# Patient Record
Sex: Female | Born: 1945 | Race: White | Hispanic: No | Marital: Married | Smoking: Never smoker
Health system: Southern US, Community
[De-identification: ages and names within clinical notes are randomized; demographics above are authoritative.]

## PROBLEM LIST (undated history)

## (undated) DIAGNOSIS — M1990 Unspecified osteoarthritis, unspecified site: Secondary | ICD-10-CM

## (undated) DIAGNOSIS — R32 Unspecified urinary incontinence: Secondary | ICD-10-CM

## (undated) DIAGNOSIS — I4891 Unspecified atrial fibrillation: Secondary | ICD-10-CM

## (undated) DIAGNOSIS — C50211 Malignant neoplasm of upper-inner quadrant of right female breast: Secondary | ICD-10-CM

## (undated) DIAGNOSIS — R55 Syncope and collapse: Secondary | ICD-10-CM

## (undated) DIAGNOSIS — E049 Nontoxic goiter, unspecified: Secondary | ICD-10-CM

## (undated) DIAGNOSIS — I480 Paroxysmal atrial fibrillation: Secondary | ICD-10-CM

## (undated) DIAGNOSIS — R159 Full incontinence of feces: Secondary | ICD-10-CM

## (undated) DIAGNOSIS — R51 Headache: Secondary | ICD-10-CM

## (undated) DIAGNOSIS — K6289 Other specified diseases of anus and rectum: Secondary | ICD-10-CM

## (undated) DIAGNOSIS — Z923 Personal history of irradiation: Secondary | ICD-10-CM

## (undated) DIAGNOSIS — Z171 Estrogen receptor negative status [ER-]: Secondary | ICD-10-CM

## (undated) DIAGNOSIS — N390 Urinary tract infection, site not specified: Secondary | ICD-10-CM

## (undated) DIAGNOSIS — N3946 Mixed incontinence: Secondary | ICD-10-CM

## (undated) DIAGNOSIS — E785 Hyperlipidemia, unspecified: Secondary | ICD-10-CM

## (undated) DIAGNOSIS — C50919 Malignant neoplasm of unspecified site of unspecified female breast: Secondary | ICD-10-CM

## (undated) DIAGNOSIS — Z803 Family history of malignant neoplasm of breast: Secondary | ICD-10-CM

## (undated) DIAGNOSIS — Z800 Family history of malignant neoplasm of digestive organs: Secondary | ICD-10-CM

## (undated) DIAGNOSIS — R519 Headache, unspecified: Secondary | ICD-10-CM

## (undated) DIAGNOSIS — Z808 Family history of malignant neoplasm of other organs or systems: Secondary | ICD-10-CM

## (undated) DIAGNOSIS — Z87898 Personal history of other specified conditions: Secondary | ICD-10-CM

## (undated) HISTORY — DX: Unspecified urinary incontinence: R32

## (undated) HISTORY — DX: Unspecified atrial fibrillation: I48.91

## (undated) HISTORY — PX: BREAST LUMPECTOMY: SHX2

## (undated) HISTORY — DX: Full incontinence of feces: R15.9

## (undated) HISTORY — DX: Family history of malignant neoplasm of digestive organs: Z80.0

## (undated) HISTORY — DX: Hyperlipidemia, unspecified: E78.5

## (undated) HISTORY — DX: Syncope and collapse: R55

## (undated) HISTORY — DX: Unspecified osteoarthritis, unspecified site: M19.90

## (undated) HISTORY — DX: Family history of malignant neoplasm of other organs or systems: Z80.8

## (undated) HISTORY — DX: Headache, unspecified: R51.9

## (undated) HISTORY — DX: Nontoxic goiter, unspecified: E04.9

## (undated) HISTORY — DX: Headache: R51

## (undated) HISTORY — DX: Family history of malignant neoplasm of breast: Z80.3

## (undated) HISTORY — DX: Urinary tract infection, site not specified: N39.0

---

## 1976-08-23 HISTORY — PX: APPENDECTOMY: SHX54

## 1976-08-23 HISTORY — PX: ABDOMINAL HYSTERECTOMY: SHX81

## 1999-08-24 HISTORY — PX: ROTATOR CUFF REPAIR: SHX139

## 2002-08-23 HISTORY — PX: CATARACT EXTRACTION: SUR2

## 2002-08-23 HISTORY — PX: CATARACT EXTRACTION W/ INTRAOCULAR LENS  IMPLANT, BILATERAL: SHX1307

## 2009-08-23 HISTORY — PX: BREAST SURGERY: SHX581

## 2009-08-23 HISTORY — PX: BREAST BIOPSY: SHX20

## 2011-08-24 HISTORY — PX: LUMBAR LAMINECTOMY: SHX95

## 2011-08-24 HISTORY — PX: LAMINECTOMY: SHX219

## 2015-08-24 HISTORY — PX: GLUTEUS MINIMUS REPAIR: SHX5843

## 2015-08-24 HISTORY — PX: OTHER SURGICAL HISTORY: SHX169

## 2018-11-08 ENCOUNTER — Ambulatory Visit: Payer: Self-pay | Admitting: Family Medicine

## 2018-11-08 ENCOUNTER — Encounter: Payer: Self-pay | Admitting: Family Medicine

## 2018-11-08 ENCOUNTER — Ambulatory Visit (INDEPENDENT_AMBULATORY_CARE_PROVIDER_SITE_OTHER): Payer: Medicare Other | Admitting: Family Medicine

## 2018-11-08 ENCOUNTER — Other Ambulatory Visit: Payer: Self-pay

## 2018-11-08 VITALS — BP 120/62 | HR 74 | Temp 98.0°F | Ht 62.0 in | Wt 128.0 lb

## 2018-11-08 DIAGNOSIS — Z1239 Encounter for other screening for malignant neoplasm of breast: Secondary | ICD-10-CM

## 2018-11-08 DIAGNOSIS — N393 Stress incontinence (female) (male): Secondary | ICD-10-CM

## 2018-11-08 DIAGNOSIS — R32 Unspecified urinary incontinence: Secondary | ICD-10-CM

## 2018-11-08 DIAGNOSIS — R159 Full incontinence of feces: Secondary | ICD-10-CM | POA: Diagnosis not present

## 2018-11-08 DIAGNOSIS — R1013 Epigastric pain: Secondary | ICD-10-CM

## 2018-11-08 DIAGNOSIS — E785 Hyperlipidemia, unspecified: Secondary | ICD-10-CM | POA: Insufficient documentation

## 2018-11-08 MED ORDER — CALCIUM CARBONATE-VITAMIN D 500-200 MG-UNIT PO TABS: 1.0000 | ORAL_TABLET | Freq: Every day | ORAL | Status: DC

## 2018-11-08 MED ORDER — CYANOCOBALAMIN 500 MCG PO TABS: 1000.0000 ug | ORAL_TABLET | Freq: Every day | ORAL | Status: DC

## 2018-11-08 NOTE — Progress Notes (Addendum)
Joanne Hansen DOB: 1945/11/05 Encounter date: 11/08/2018  This isa 73 y.o. female who presents to establish care. Chief Complaint  Patient presents with  . Establish Care    Pt states she has had stomach pain and has been drinking alchol pain everyday stopped on february 22nd and states that her stomach has not been to bad since. Pt states she has also had rashes on her chest and breast she is concerned about     History of present illness: Moved here 4 months ago from [place]. Had same physician for 20 years there. Moved to be near daughter here.   Had crampy pain in stomach on a few occasions. Last time was pretty significant. Cut out alcohol Feb 22nd and stomach has felt better since then. Was drinking more than baseline. Alcohol has never been an issue for her in the past; typically would do a couple of bev  Had some pain left side abd last night. No pain right now. No fevers.Bowels have always been issue for her. Fecal incontinence is issue for her and it has changed lifestyle for her. Did clinical trial for bulking agent but it didn't work for her. Specialist she was seeing was  Gastroenterology and there was discussion about implant, but this wasn't pursued. Had constipation most of life. Was taking magnesium but it was causing diarrhea sx. Stopped this a couple of months ago. Usually has leakage after completing bowel movement (or thinking that she has) and will have some leakage after having gone and cleansed self.   Also has urinary incontinence more with stress. Wears protection.   No nausea, no vomiting. No acid reflux.   Took pepcid for about a week after abdominal pain started. This seemed to help with symptoms.   Usually not more than 2 drinks in a setting; and then occasionally wine.   Suddenly has had rash on breast - one on left side that dried up and then just went away. Circular lesion just appeared and then went away. Not itchy, Not seen elsewhere. One  currently on right side of chest.   Was started on B12 for deficiency. Has been taking it for some years.   Last DEXA was 2 years ago; was normal and improved from previous. Ca and Vit D started as preventative. Has been on this for years.   Changed diet a couple of years ago - pescatarian. Really helped with hip pain and overall aches/pains.   Past Medical History:  Diagnosis Date  . A-fib ([place])   . Arthritis   . Fecal incontinence    was in clinical trial for [facility] without benefit.  . Frequent headaches   . Goiter   . Hyperlipidemia   . Syncope   . Urine incontinence   . UTI (urinary tract infection)    Past Surgical History:  Procedure Laterality Date  . ABDOMINAL HYSTERECTOMY  1978   endometriosis, left ovary and tube remain. No hx of abnormal pap smears  . APPENDECTOMY  1978  . BREAST SURGERY  2011   biospy of right breast  . CATARACT EXTRACTION Bilateral 2004  . GLUTEUS MINIMUS REPAIR  2017  . LAMINECTOMY  2013   L5S1  . ROTATOR CUFF REPAIR  2001   left   Allergies  Allergen Reactions  . Penicillins Swelling  . Sulfasalazine Nausea And Vomiting   Current Meds  Medication Sig  . metoprolol tartrate (LOPRESSOR) 25 MG tablet TAKE 1 2 (ONE HALF) TABLET BY MOUTH TWICE DAILY  .  simvastatin (ZOCOR) 40 MG tablet Take 40 mg by mouth daily.   Social History   Tobacco Use  . Smoking status: Never Smoker  . Smokeless tobacco: Never Used  Substance Use Topics  . Alcohol use: Yes   Family History  Problem Relation Age of Onset  . COPD Mother   . Cancer Mother        rectal  . Arthritis Mother   . Hypertension Mother   . Miscarriages / [place] Mother   . Stroke Mother 62  . Heart disease Father   . Heart attack Father 9  . Lung disease Father   . Arthritis Sister   . Diabetes Maternal Grandmother      Review of Systems  Constitutional: Negative for chills, fatigue and fever.  Respiratory: Negative for cough, chest tightness, shortness of breath  and wheezing.   Cardiovascular: Negative for chest pain, palpitations and leg swelling.  Gastrointestinal: Positive for abdominal pain (none currently). Negative for blood in stool, constipation, diarrhea, nausea and vomiting.    Objective:  BP 120/62 (BP Location: Right Arm, Patient Position: Sitting, Cuff Size: Normal)   Pulse 74   Temp 98 F (36.7 C) (Oral)   Ht 5\' 2"  (1.575 m)   Wt 128 lb (58.1 kg)   SpO2 97%   BMI 23.41 kg/m   Weight: 128 lb (58.1 kg)   BP Readings from Last 3 Encounters:  11/08/18 120/62   Wt Readings from Last 3 Encounters:  11/08/18 128 lb (58.1 kg)    Physical Exam Constitutional:      General: She is not in acute distress.    Appearance: She is well-developed.  Cardiovascular:     Rate and Rhythm: Normal rate and regular rhythm.     Heart sounds: Normal heart sounds. No murmur. No friction rub.  Pulmonary:     Effort: Pulmonary effort is normal. No respiratory distress.     Breath sounds: Normal breath sounds. No wheezing or rales.  Abdominal:     General: Abdomen is flat. Bowel sounds are normal. There is no distension.     Palpations: Abdomen is soft. There is no hepatomegaly or splenomegaly.     Tenderness: There is abdominal tenderness (mild) in the epigastric area. There is no guarding or rebound. Negative signs include Murphy's sign and McBurney's sign.  Musculoskeletal:     Right lower leg: No edema.     Left lower leg: No edema.  Neurological:     Mental Status: She is alert and oriented to person, place, and time.  Psychiatric:        Behavior: Behavior normal.     Assessment/Plan: 1. Incontinence of feces, unspecified fecal incontinence type Has tried PO bulking agents, went through clinicl trial of Solesta without benefit. Will have her follow up with [facility] to see what additional they may offer. This is affecting her on daily basis and interfering from social standpoint. - Ambulatory referral to Gastroenterology  2.  Screening for breast cancer Due for repeat mammogram. Will work on getting this ordered (currently getting [employer] printing with attempt at ordering).  3. Urinary incontinence, unspecified type Did some pelvic rehab for fecal incontinence in past; will have her evaluated by uro/gyn since this is significant ongoing issue for her and she prefers to address without medication.  - Ambulatory referral to Urogynecology  4. Abdominal pain: we are referring to GI for fecal incontinence.  Continue with Pepcid as needed.  Recommended continuing to limit alcohol as this  may trigger worsening abdominal pain. Discussed foods that will trigger stomach irritation (ie caffeine, citrus, alcohol). Let us know if any worsening of sx.   Return pending record review.  Micheline Rough, MD  Records from PCP and cardiology requested. A fib tx with metoprolol and it was not recommended to be on blood thinners or aspirin. Will review and let her know once records are seen.

## 2018-12-05 ENCOUNTER — Other Ambulatory Visit: Payer: Self-pay | Admitting: Family Medicine

## 2018-12-05 NOTE — Telephone Encounter (Signed)
Medication is from a historical provider.  Ok to fill?

## 2018-12-05 NOTE — Telephone Encounter (Signed)
Requested medication (s) are due for refill today: yes  Requested medication (s) are on the active medication list: yes  Last refill:  11/08/18  Future visit scheduled: no  Notes to clinic:  Historical med and historical provider   Requested Prescriptions  Pending Prescriptions Disp Refills   metoprolol tartrate (LOPRESSOR) 25 MG tablet       Cardiovascular:  Beta Blockers Passed - 12/05/2018  9:55 AM      Passed - Last BP in normal range    BP Readings from Last 1 Encounters:  11/08/18 120/62         Passed - Last Heart Rate in normal range    Pulse Readings from Last 1 Encounters:  11/08/18 74         Passed - Valid encounter within last 6 months    Recent Outpatient Visits          3 weeks ago Incontinence of feces, unspecified fecal incontinence type   Therapist, music at [employer], Steele Berg, MD

## 2018-12-06 ENCOUNTER — Other Ambulatory Visit: Payer: Self-pay | Admitting: Family Medicine

## 2018-12-06 MED ORDER — METOPROLOL TARTRATE 25 MG PO TABS: ORAL_TABLET | ORAL | 1 refills | Status: DC

## 2018-12-06 NOTE — Telephone Encounter (Signed)
Patient is calling to check status on RX, she stated that she is taking 0.5 tab BID.

## 2018-12-06 NOTE — Telephone Encounter (Signed)
Please advise. Rx was filled by historical provider in January.  Ok to fill?

## 2018-12-06 NOTE — Telephone Encounter (Signed)
[place] for refill; please clarify dosing since rx refill came across without sig. At visit looked like 25mg  tablet and she was doing 1/2 tab BID. OK to send in 90 day supply of current dose once confirmed.

## 2018-12-06 NOTE — Telephone Encounter (Signed)
I have sent in refill for her

## 2019-01-12 ENCOUNTER — Telehealth: Payer: Self-pay | Admitting: Family Medicine

## 2019-01-12 NOTE — Telephone Encounter (Signed)
Copied from [place] [phone]. Topic: Quick Communication - Rx Refill/Question >> Jan 12, 2019 12:32 PM Mcneil, Ja-Kwan wrote: Medication: simvastatin (ZOCOR) 40 MG tablet  - Pt requests 60 day or 90 day supply  Has the patient contacted their pharmacy? no  Preferred Pharmacy (with phone number or street name): [place], [place] [phone] (Phone) [phone] (Fax)  Agent: Please be advised that RX refills may take up to 3 business days. We ask that you follow-up with your pharmacy.

## 2019-01-17 ENCOUNTER — Other Ambulatory Visit: Payer: Self-pay | Admitting: Family Medicine

## 2019-01-17 MED ORDER — SIMVASTATIN 40 MG PO TABS: 40.0000 mg | ORAL_TABLET | Freq: Every day | ORAL | 1 refills | Status: DC

## 2019-01-17 NOTE — Telephone Encounter (Signed)
I called the pt and informed her of the message below

## 2019-01-17 NOTE — Telephone Encounter (Signed)
Pt calling to check status of refill request.

## 2019-01-17 NOTE — Telephone Encounter (Signed)
done

## 2019-01-21 ENCOUNTER — Encounter: Payer: Self-pay | Admitting: Family Medicine

## 2019-02-27 ENCOUNTER — Telehealth: Payer: Self-pay

## 2019-02-27 ENCOUNTER — Telehealth: Payer: Self-pay | Admitting: *Deleted

## 2019-02-27 DIAGNOSIS — Z1239 Encounter for other screening for malignant neoplasm of breast: Secondary | ICD-10-CM

## 2019-02-27 DIAGNOSIS — R159 Full incontinence of feces: Secondary | ICD-10-CM

## 2019-02-27 NOTE — Telephone Encounter (Signed)
Copied from [place] [id number]. Topic: General - Other >> Feb 27, 2019 11:02 AM Marin Olp L wrote: Reason for CRM: Patient would like mammo in [place]. She also wants to know when she is supposed to follow up overall. Please advise.

## 2019-02-27 NOTE — Telephone Encounter (Signed)
Copied from [place] [id number]. Topic: Referral - Request for Referral >> Feb 27, 2019 10:59 AM Marin Olp L wrote: Has patient seen PCP for this complaint? Yes.   *If NO, is insurance requiring patient see PCP for this issue before PCP can refer them? Referral for which specialty: rectal surgeon Preferred provider/office: Dr. Leighton Ruff [facility] [address] P: [phone] Reason for referral: incontinance  Would like a call once placed.

## 2019-02-28 NOTE — Telephone Encounter (Signed)
[place] for mammogram order breast center. 6 months from previous visit is fine unless she is having any issues sooner. If she is feeling well overall; we could delay this pending COVID status.

## 2019-02-28 NOTE — Telephone Encounter (Signed)
Referral placed and I called the pt and informed her someone will call with appt info.

## 2019-02-28 NOTE — Telephone Encounter (Signed)
Ok to place referral.

## 2019-02-28 NOTE — Telephone Encounter (Signed)
I called the pt and informed her the order was placed.  Patient given the number and she agreed to call the [facility] for an appt.

## 2019-03-15 ENCOUNTER — Other Ambulatory Visit: Payer: Self-pay | Admitting: General Surgery

## 2019-03-15 NOTE — H&P (Signed)
History of Present Illness Leighton Ruff MD; 5/39/7673 1:55 PM) The patient is a 73 year old female who presents with fecal incontinence. 73 year old female who presents to the office with complaints of fecal incontinence. She states that she has leakage after bowel movements only. She occasionally has incontinence to flatus as well. This is been going on for several years. She was involved in a study in [place] for Solesta injections but this showed no improvement in her symptoms. She has tried Metamucil supplementation in the past. She has a history of constipation, but has improved this with dietary modifications.   Past Surgical History Mammie Lorenzo, LPN; 12/09/3788 2:40 PM) Appendectomy Breast Biopsy Right. Cataract Surgery Bilateral. Foot Surgery Right. Hysterectomy (not due to cancer) - Complete Oral Surgery Shoulder Surgery Left. Spinal Surgery - Lower Back  Diagnostic Studies History Mammie Lorenzo, LPN; 9/73/5329 9:24 PM) Colonoscopy 5-10 years ago Mammogram 1-3 years ago Pap Smear >5 years ago  Allergies Nance Pew, CMA; 03/15/2019 1:47 PM) Penicillins Allergies Reconciled  Medication History Nance Pew, CMA; 03/15/2019 1:48 PM) Simvastatin (10MG  Tablet, Oral) Active. Calcium (500MG  Tablet, Oral) Active. B Complex w/B-12 (Oral) Active. Metoprolol Succinate ER (50MG  Tablet ER 24HR, Oral) Active. Myrbetriq (25MG  Tablet ER 24HR, Oral) Active. Medications Reconciled  Social History Mammie Lorenzo, LPN; 2/68/3419 6:22 PM) Alcohol use Occasional alcohol use. Caffeine use Coffee. Illicit drug use Prefer to discuss with provider. Tobacco use Former smoker.  Family History Mammie Lorenzo, LPN; 2/97/9892 1:19 PM) Arthritis Mother. Breast Cancer Sister. Heart Disease Father. Heart disease in female family member before age 28 Hypertension Mother. Rectal Cancer Mother.  Pregnancy / Birth History Mammie Lorenzo, LPN; 12/08/4079  4:48 PM) Age of menopause <47 Contraceptive History Intrauterine device, Oral contraceptives. Gravida 1 Maternal age 26-20 Para 1  Other Problems Mammie Lorenzo, LPN; 1/85/6314 9:70 PM) Arthritis Atrial Fibrillation Back Pain Hemorrhoids Hypercholesterolemia Migraine Headache     Review of Systems Mammie Lorenzo LPN; 2/63/7858 8:50 PM) General Not Present- Appetite Loss, Chills, Fatigue, Fever, Night Sweats, Weight Gain and Weight Loss. Skin Not Present- Change in Wart/Mole, Dryness, Hives, Jaundice, New Lesions, Non-Healing Wounds, Rash and Ulcer. HEENT Not Present- Earache, Hearing Loss, Hoarseness, Nose Bleed, Oral Ulcers, Ringing in the Ears, Seasonal Allergies, Sinus Pain, Sore Throat, Visual Disturbances, Wears glasses/contact lenses and Yellow Eyes. Respiratory Not Present- Bloody sputum, Chronic Cough, Difficulty Breathing, Snoring and Wheezing. Breast Not Present- Breast Mass, Breast Pain, Nipple Discharge and Skin Changes. Cardiovascular Not Present- Chest Pain, Difficulty Breathing Lying Down, Leg Cramps, Palpitations, Rapid Heart Rate, Shortness of Breath and Swelling of Extremities. Gastrointestinal Present- Hemorrhoids. Not Present- Abdominal Pain, Bloating, Bloody Stool, Change in Bowel Habits, Chronic diarrhea, Constipation, Difficulty Swallowing, Excessive gas, Gets full quickly at meals, Indigestion, Nausea, Rectal Pain and Vomiting. Female Genitourinary Not Present- Frequency, Nocturia, Painful Urination, Pelvic Pain and Urgency. Musculoskeletal Not Present- Back Pain, Joint Pain, Joint Stiffness, Muscle Pain, Muscle Weakness and Swelling of Extremities. Neurological Not Present- Decreased Memory, Fainting, Headaches, Numbness, Seizures, Tingling, Tremor, Trouble walking and Weakness. Psychiatric Not Present- Anxiety, Bipolar, Change in Sleep Pattern, Depression, Fearful and Frequent crying. Endocrine Not Present- Cold Intolerance, Excessive Hunger, Hair  Changes, Heat Intolerance, Hot flashes and New Diabetes. Hematology Not Present- Blood Thinners, Easy Bruising, Excessive bleeding, Gland problems, HIV and Persistent Infections.  Vitals (Sabrina Canty CMA; 03/15/2019 1:49 PM) 03/15/2019 1:49 PM Weight: 127.4 lb Height: 62in Body Surface Area: 1.58 m Body Mass Index: 23.3 kg/m  Temp.: 98.44F(Oral)  Pulse: 98 (Regular)  BP:  118/62 (Sitting, Left Arm, Standard)        Physical Exam Leighton Ruff MD; 09/24/5425 2:06 PM)  General Mental Status-Alert. General Appearance-Not in acute distress. Build & Nutrition-Well nourished. Posture-Normal posture. Gait-Normal.  Head and Neck Head-normocephalic, atraumatic with no lesions or palpable masses. Trachea-midline.  Chest and Lung Exam Chest and lung exam reveals -on auscultation, normal breath sounds, no adventitious sounds and normal vocal resonance.  Cardiovascular Cardiovascular examination reveals -normal heart sounds, regular rate and rhythm with no murmurs and no digital clubbing, cyanosis, edema, increased warmth or tenderness.  Abdomen Inspection Inspection of the abdomen reveals - No Hernias. Palpation/Percussion Palpation and Percussion of the abdomen reveal - Soft, Non Tender, No Rigidity (guarding), No hepatosplenomegaly and No Palpable abdominal masses.  Rectal Anorectal Exam External - Note: No anal gape, good resting tone, moderately weak squeeze pressure.  Neurologic Neurologic evaluation reveals -alert and oriented x 3 with no impairment of recent or remote memory, normal attention span and ability to concentrate, normal sensation and normal coordination.  Musculoskeletal Normal Exam - Bilateral-Upper Extremity Strength Normal and Lower Extremity Strength Normal.   Results Leighton Ruff MD; 0/62/3762 2:12 PM) Procedures  Name Value Date ANOSCOPY, DIAGNOSTIC (83151) [ Hemorrhoids ] Procedure Other: Procedure:  Anoscopy....Marland KitchenMarland KitchenSurgeon: Marcello Moores....Marland KitchenMarland KitchenAfter the risks and benefits were explained, verbal consent was obtained for above procedure. A medical assistant chaperone was present thoroughout the entire procedure. ....Marland KitchenMarland KitchenAnesthesia: none....Marland KitchenMarland KitchenDiagnosis: fecal incontinence....Marland KitchenMarland KitchenFindings: Mobile cyst, elevated in distal left anterior lateral rectum. I am unable to visualize this with anoscope. Grade 2 hemorrhoids without obvious inflammation.  Performed: 03/15/2019 2:11 PM    Assessment & Plan Leighton Ruff MD; 7/61/6073 2:11 PM)  RECTAL CYST (K62.89) Impression: Patient underwent anoscopic exam and digital rectal exam today. There was a mobile cyst noted in her right anterior lateral distal rectum approximately 8 cm from anal verge. I could not visually evaluate this with anoscope. I recommended flexible sigmoidoscopy with possible transanal excision.   FECAL INCONTINENCE WITH INCOMPLETE DEFECATION (R15.9) Impression: 73 year old female who presents to the office for evaluation of fecal incontinence. She complains of incomplete evacuation and leakage of stool after bowel movements. On exam today, she has relatively good resting tone but weak squeeze pressures. Her bowel movements are soft. I would like to try to get her to have more bulky bowel movements and see if this helps with her evacuation issues. I have recommended that she start a fiber supplement that is water-soluble. We discussed starting a fiber pill 1-2 per day. I will check on her symptoms in approximately 1 month.

## 2019-04-03 ENCOUNTER — Other Ambulatory Visit: Payer: Self-pay | Admitting: *Deleted

## 2019-04-03 DIAGNOSIS — E785 Hyperlipidemia, unspecified: Secondary | ICD-10-CM

## 2019-04-03 DIAGNOSIS — I4891 Unspecified atrial fibrillation: Secondary | ICD-10-CM

## 2019-04-10 ENCOUNTER — Ambulatory Visit
Admission: RE | Admit: 2019-04-10 | Discharge: 2019-04-10 | Disposition: A | Discharge status: Home / Self Care | Payer: Medicare Other | Source: Ambulatory Visit | Attending: Family Medicine | Admitting: Family Medicine

## 2019-04-10 ENCOUNTER — Other Ambulatory Visit: Payer: Self-pay

## 2019-04-10 DIAGNOSIS — Z1239 Encounter for other screening for malignant neoplasm of breast: Secondary | ICD-10-CM

## 2019-04-11 ENCOUNTER — Other Ambulatory Visit: Payer: Self-pay | Admitting: Family Medicine

## 2019-04-11 DIAGNOSIS — R928 Other abnormal and inconclusive findings on diagnostic imaging of breast: Secondary | ICD-10-CM

## 2019-04-12 ENCOUNTER — Ambulatory Visit
Admission: RE | Admit: 2019-04-12 | Discharge: 2019-04-12 | Disposition: A | Discharge status: Home / Self Care | Payer: Medicare Other | Source: Ambulatory Visit | Attending: Family Medicine | Admitting: Family Medicine

## 2019-04-12 ENCOUNTER — Other Ambulatory Visit: Payer: Self-pay | Admitting: Family Medicine

## 2019-04-12 ENCOUNTER — Other Ambulatory Visit: Payer: Self-pay

## 2019-04-12 DIAGNOSIS — N6310 Unspecified lump in the right breast, unspecified quadrant: Secondary | ICD-10-CM

## 2019-04-12 DIAGNOSIS — R928 Other abnormal and inconclusive findings on diagnostic imaging of breast: Secondary | ICD-10-CM

## 2019-04-16 ENCOUNTER — Other Ambulatory Visit: Payer: Self-pay

## 2019-04-16 ENCOUNTER — Ambulatory Visit
Admission: RE | Admit: 2019-04-16 | Discharge: 2019-04-16 | Disposition: A | Discharge status: Home / Self Care | Payer: Medicare Other | Source: Ambulatory Visit | Attending: Family Medicine | Admitting: Family Medicine

## 2019-04-16 DIAGNOSIS — N6310 Unspecified lump in the right breast, unspecified quadrant: Secondary | ICD-10-CM

## 2019-04-20 ENCOUNTER — Telehealth: Payer: Self-pay | Admitting: Family Medicine

## 2019-04-20 NOTE — Telephone Encounter (Signed)
Just called to touch base with patient regarding breast biopsy + for cancer.   She has appointment scheduled on Monday with surgeon. She does not yet have appointment with oncology but as we discussed I suspect that will occur after meeting with surgeon next week.   She does report that fecal incontinence has improved with taking fiber supplementation. She is currently scheduled for sigmoidoscopy 9/24 and is going to discuss this with surgeon on Monday as well.   I let her know to reach out if needing anything. I will follow along with specialists.

## 2019-04-23 ENCOUNTER — Ambulatory Visit: Payer: Self-pay | Admitting: Surgery

## 2019-04-23 DIAGNOSIS — C50911 Malignant neoplasm of unspecified site of right female breast: Secondary | ICD-10-CM

## 2019-04-23 NOTE — H&P (Signed)
Joanne Hansen Documented: 04/23/2019 11:14 AM Location: [facility] Patient #: [id number] DOB: [birth year] Married / Language: [name] / Race: White Female  History of Present Illness Marcello Moores A. Jannelly Bergren MD; 04/23/2019 12:48 PM) Patient words: Patient sent at the request of Dr. Radford Pax due to an abnormal mammogram. The patient went for screening mammogram was found to have an area of 2 masses right breast at 1:00 which were adjacent to each other. The total area measured 1.1 cm. 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 Prognostic panel pending. The patient states her sister was recently diagnosed with breast cancer at age 15 and is alive. She denies any history of breast pain, nipple discharge or breast mass bilaterally. She is scheduled later in September for a flexible sigmoidoscopy by Dr. Marcello Moores for incontinence and a small cyst.        CLINICAL DATA: 73 year old patient recalled from recent screening mammogram for evaluation a possible asymmetry. EXAM: DIGITAL DIAGNOSTIC RIGHT MAMMOGRAM WITH TOMO ULTRASOUND RIGHT BREAST COMPARISON: Screening mammogram April 10, 2019 and earlier priors ACR Breast Density Category b: There are scattered areas of fibroglandular density. FINDINGS: Spot compression views of the medial right breast, including tomography confirm an irregular mass spanning approximately 9 mm. On physical exam, no mass is palpated in the upper inner quadrant of the right breast. Targeted ultrasound is performed, showing a single irregular mass versus two immediately adjacent small irregular masses, that in total measure 1.1 cm. Individually, the two irregular masses measure 0.8 x 0.4 x 0.5 cm and 0.4 x 0.4 x 0.4 cm, respectively. Mass(es) are at 1 o'clock position 3 cm from the nipple. Ultrasound of the right axilla is negative for lymphadenopathy. IMPRESSION: Two immediately adjacent suspicious masses in the 1 o'clock  position of the right breast 3 cm from the nipple. In total, these measure approximately 1.1 cm. RECOMMENDATION: Ultrasound-guided biopsy (single site) of the suspicious findings at 1 o'clock position in the right breast. I have discussed the findings and recommendations with the patient. Results were also provided in writing at the conclusion of the visit. If applicable, a reminder letter will be sent to the patient regarding the next appointment. BI-RADS CATEGORY 5: Highly suggestive of malignancy. Electronically Signed By: Curlene Dolphin M.D. On: 04/12/2019 16:26  Result History   Study Result  CLINICAL DATA: 73 year old patient recalled from recent screening mammogram for evaluation a possible asymmetry. EXAM: DIGITAL DIAGNOSTIC RIGHT MAMMOGRAM WITH TOMO ULTRASOUND RIGHT BREAST COMPARISON: Screening mammogram April 10, 2019 and earlier priors ACR Breast Density Category b: There are scattered areas of fibroglandular density. FINDINGS: Spot compression views of the medial right breast, including tomography confirm an irregular mass spanning approximately 9 mm. On physical exam, no mass is palpated in the upper inner quadrant of the right breast. Targeted ultrasound is performed, showing a single irregular mass versus two immediately adjacent small irregular masses, that in total measure 1.1 cm. Individually, the two irregular masses measure 0.8 x 0.4 x 0.5 cm and 0.4 x 0.4 x 0.4 cm, respectively. Mass(es) are at 1 o'clock position 3 cm from the nipple. Ultrasound of the right axilla is negative for lymphadenopathy. IMPRESSION: Two immediately adjacent suspicious masses in the 1 o'clock position of the right breast 3 cm from the nipple. In total, these measure approximately 1.1 cm. RECOMMENDATION: Ultrasound-guided biopsy (single site) of the suspicious findings at 1 o'clock position in the right breast. I have discussed the findings and recommendations with the  patient. Results were also  provided in writing at the conclusion of the visit. If applicable, a reminder letter will be sent to the patient regarding the next appointment. BI-RADS CATEGORY 5: Highly suggestive of malignancy. Electronically Signed By: Curlene Dolphin M.D. On: 04/12/2019 16:26            Diagnosis Breast, right, needle core biopsy, 1 o'clock - INVASIVE DUCTAL CARCINOMA. - SEE COMMENT. Microscopic Comment The carcinoma appears at least grade II. A breast prognostic profile will be performed and the results reported separately. The results were called to [facility] on 04/17/2019. (JBK:ah 04/17/19).  The patient is a 73 year old female.   Allergies Sabino Gasser, CMA; 04/23/2019 11:14 AM) Penicillins Allergies Reconciled  Medication History Sabino Gasser, CMA; 04/23/2019 11:15 AM) Metoprolol Succinate ER (50MG  Tablet ER 24HR, Oral) Active. Medications Reconciled Simvastatin (10MG  Tablet, Oral) Active. Calcium (500MG  Tablet, Oral) Active. B Complex w/B-12 (Oral) Active.    Vitals Sabino Gasser CMA; 04/23/2019 11:17 AM) 04/23/2019 11:15 AM Weight: 128.13 lb Height: 64in Body Surface Area: 1.62 m Body Mass Index: 21.99 kg/m  Temp.: 98.22F(Oral)  Pulse: 97 (Regular)  BP: 110/72 (Sitting, Left Arm, Standard)        Physical Exam (Shiann Kam A. Chyan Carnero MD; 04/23/2019 12:48 PM)  General Mental Status-Alert. General Appearance-Consistent with stated age. Hydration-Well hydrated. Voice-Normal.  Head and Neck Head-normocephalic, atraumatic with no lesions or palpable masses. Trachea-midline. Thyroid Gland Characteristics - normal size and consistency.  Chest and Lung Exam Chest and lung exam reveals -quiet, even and easy respiratory effort with no use of accessory muscles and on auscultation, normal breath sounds, no adventitious sounds and normal vocal resonance. Inspection Chest Wall -  Normal. Back - normal.  Breast Note: Steri-Strips of her right medial breast noted. No hematoma or masses palpable. Left breast is normal.  Cardiovascular Cardiovascular examination reveals -normal heart sounds, regular rate and rhythm with no murmurs and normal pedal pulses bilaterally.  Neurologic Neurologic evaluation reveals -alert and oriented x 3 with no impairment of recent or remote memory. Mental Status-Normal.  Musculoskeletal Normal Exam - Left-Upper Extremity Strength Normal and Lower Extremity Strength Normal. Normal Exam - Right-Upper Extremity Strength Normal and Lower Extremity Strength Normal.  Lymphatic Head & Neck  General Head & Neck Lymphatics: Bilateral - Description - Normal. Axillary  General Axillary Region: Bilateral - Description - Normal. Tenderness - Non Tender.    Assessment & Plan (Kailah Pennel A. Ojas Coone MD; 04/23/2019 12:49 PM)  BREAST CANCER, RIGHT (C50.911) Impression: Stage I Discussed breast conserving surgery with lumpectomy and axillary recent lymph node mapping. She is scheduled for a flexible sigmoidoscopy later in September due to fecal continence issues and a possible cyst. I discussed this with Dr. Marcello Moores today in the office and she states her procedure conservatively be moved or postponed if necessary given her circumstances.   Risk of lumpectomy include bleeding, infection, seroma, more surgery, use of seed/wire, wound care, cosmetic deformity and the need for other treatments, death , blood clots, death. Pt agrees to proceed. Risk of sentinel lymph node mapping include bleeding, infection, lymphedema, shoulder pain. stiffness, dye allergy. cosmetic deformity , blood clots, death, need for more surgery. Pt agrees to proceed.  Current Plans You are being scheduled for surgery- Our schedulers will call you.  You should hear from our office's scheduling department within 5 working days about the location, date, and time of  surgery. We try to make accommodations for patient's preferences in scheduling surgery, but sometimes the OR schedule or the  surgeon's schedule prevents [place] from making those accommodations.  If you have not heard from our office [phone]) in 5 working days, call the office and ask for your surgeon's nurse.  If you have other questions about your diagnosis, plan, or surgery, call the office and ask for your surgeon's nurse.  Pt Education - [facility] Breast Cancer Information Given - Alight "Breast Journey" Package We discussed the staging and pathophysiology of breast cancer. We discussed all of the different options for treatment for breast cancer including surgery, chemotherapy, radiation therapy, Herceptin, and antiestrogen therapy. We discussed a sentinel lymph node biopsy as she does not appear to having lymph node involvement right now. We discussed the performance of that with injection of radioactive tracer and blue dye. We discussed that she would have an incision underneath her axillary hairline. We discussed that there is a bout a 10-20% chance of having a positive node with a sentinel lymph node biopsy and we will await the permanent pathology to make any other first further decisions in terms of her treatment. One of these options might be to return to the operating room to perform an axillary lymph node dissection. We discussed about a 1-2% risk lifetime of chronic shoulder pain as well as lymphedema associated with a sentinel lymph node biopsy. We discussed the options for treatment of the breast cancer which included lumpectomy versus a mastectomy. We discussed the performance of the lumpectomy with a wire placement. We discussed a 10-20% chance of a positive margin requiring reexcision in the operating room. We also discussed that she may need radiation therapy or antiestrogen therapy or both if she undergoes lumpectomy. We discussed the mastectomy and the postoperative care for that as  well. We discussed that there is no difference in her survival whether she undergoes lumpectomy with radiation therapy or antiestrogen therapy versus a mastectomy. There is a slight difference in the local recurrence rate being 3-5% with lumpectomy and about 1% with a mastectomy. We discussed the risks of operation including bleeding, infection, possible reoperation. She understands her further therapy will be based on what her stages at the time of her operation.  Pt Education - flb breast cancer surgery: discussed with patient and provided information. Pt Education - [facility] Breast Biopsy HCI: discussed with patient and provided information. Pt Education - ABC (After Breast Cancer) Class Info: discussed with patient and provided information.

## 2019-04-23 NOTE — H&P (View-Only) (Signed)
Joanne Hansen Documented: 04/23/2019 11:14 AM Location: [facility] Patient #: [id number] DOB: 27-Jul-1946 Married / Language: [name] / Race: White Female  History of Present Illness Joanne Hansen A. Joanne Siegert MD; 04/23/2019 12:48 PM) Patient words: Patient sent at the request of Dr. Radford Pax due to an abnormal mammogram. The patient went for screening mammogram was found to have an area of 2 masses right breast at 1:00 which were adjacent to each other. The total area measured 1.1 cm. 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 Prognostic panel pending. The patient states her sister was recently diagnosed with breast cancer at age 91 and is alive. She denies any history of breast pain, nipple discharge or breast mass bilaterally. She is scheduled later in September for a flexible sigmoidoscopy by Dr. Marcello Hansen for incontinence and a small cyst.        CLINICAL DATA: 73 year old patient recalled from recent screening mammogram for evaluation a possible asymmetry. EXAM: DIGITAL DIAGNOSTIC RIGHT MAMMOGRAM WITH TOMO ULTRASOUND RIGHT BREAST COMPARISON: Screening mammogram April 10, 2019 and earlier priors ACR Breast Density Category b: There are scattered areas of fibroglandular density. FINDINGS: Spot compression views of the medial right breast, including tomography confirm an irregular mass spanning approximately 9 mm. On physical exam, no mass is palpated in the upper inner quadrant of the right breast. Targeted ultrasound is performed, showing a single irregular mass versus two immediately adjacent small irregular masses, that in total measure 1.1 cm. Individually, the two irregular masses measure 0.8 x 0.4 x 0.5 cm and 0.4 x 0.4 x 0.4 cm, respectively. Mass(es) are at 1 o'clock position 3 cm from the nipple. Ultrasound of the right axilla is negative for lymphadenopathy. IMPRESSION: Two immediately adjacent suspicious masses in the 1 o'clock  position of the right breast 3 cm from the nipple. In total, these measure approximately 1.1 cm. RECOMMENDATION: Ultrasound-guided biopsy (single site) of the suspicious findings at 1 o'clock position in the right breast. I have discussed the findings and recommendations with the patient. Results were also provided in writing at the conclusion of the visit. If applicable, a reminder letter will be sent to the patient regarding the next appointment. BI-RADS CATEGORY 5: Highly suggestive of malignancy. Electronically Signed By: Curlene Dolphin M.D. On: 04/12/2019 16:26  Result History   Study Result  CLINICAL DATA: 73 year old patient recalled from recent screening mammogram for evaluation a possible asymmetry. EXAM: DIGITAL DIAGNOSTIC RIGHT MAMMOGRAM WITH TOMO ULTRASOUND RIGHT BREAST COMPARISON: Screening mammogram April 10, 2019 and earlier priors ACR Breast Density Category b: There are scattered areas of fibroglandular density. FINDINGS: Spot compression views of the medial right breast, including tomography confirm an irregular mass spanning approximately 9 mm. On physical exam, no mass is palpated in the upper inner quadrant of the right breast. Targeted ultrasound is performed, showing a single irregular mass versus two immediately adjacent small irregular masses, that in total measure 1.1 cm. Individually, the two irregular masses measure 0.8 x 0.4 x 0.5 cm and 0.4 x 0.4 x 0.4 cm, respectively. Mass(es) are at 1 o'clock position 3 cm from the nipple. Ultrasound of the right axilla is negative for lymphadenopathy. IMPRESSION: Two immediately adjacent suspicious masses in the 1 o'clock position of the right breast 3 cm from the nipple. In total, these measure approximately 1.1 cm. RECOMMENDATION: Ultrasound-guided biopsy (single site) of the suspicious findings at 1 o'clock position in the right breast. I have discussed the findings and recommendations with the  patient. Results were also  provided in writing at the conclusion of the visit. If applicable, a reminder letter will be sent to the patient regarding the next appointment. BI-RADS CATEGORY 5: Highly suggestive of malignancy. Electronically Signed By: Curlene Dolphin M.D. On: 04/12/2019 16:26            Diagnosis Breast, right, needle core biopsy, 1 o'clock - INVASIVE DUCTAL CARCINOMA. - SEE COMMENT. Microscopic Comment The carcinoma appears at least grade II. A breast prognostic profile will be performed and the results reported separately. The results were called to [facility] on 04/17/2019. (JBK:ah 04/17/19).  The patient is a 73 year old female.   Allergies Sabino Gasser, CMA; 04/23/2019 11:14 AM) Penicillins Allergies Reconciled  Medication History Sabino Gasser, CMA; 04/23/2019 11:15 AM) Metoprolol Succinate ER (50MG  Tablet ER 24HR, Oral) Active. Medications Reconciled Simvastatin (10MG  Tablet, Oral) Active. Calcium (500MG  Tablet, Oral) Active. B Complex w/B-12 (Oral) Active.    Vitals Sabino Gasser CMA; 04/23/2019 11:17 AM) 04/23/2019 11:15 AM Weight: 128.13 lb Height: 64in Body Surface Area: 1.62 m Body Mass Index: 21.99 kg/m  Temp.: 98.35F(Oral)  Pulse: 97 (Regular)  BP: 110/72 (Sitting, Left Arm, Standard)        Physical Exam (Darvis Croft A. Priyah Schmuck MD; 04/23/2019 12:48 PM)  General Mental Status-Alert. General Appearance-Consistent with stated age. Hydration-Well hydrated. Voice-Normal.  Head and Neck Head-normocephalic, atraumatic with no lesions or palpable masses. Trachea-midline. Thyroid Gland Characteristics - normal size and consistency.  Chest and Lung Exam Chest and lung exam reveals -quiet, even and easy respiratory effort with no use of accessory muscles and on auscultation, normal breath sounds, no adventitious sounds and normal vocal resonance. Inspection Chest Wall -  Normal. Back - normal.  Breast Note: Steri-Strips of her right medial breast noted. No hematoma or masses palpable. Left breast is normal.  Cardiovascular Cardiovascular examination reveals -normal heart sounds, regular rate and rhythm with no murmurs and normal pedal pulses bilaterally.  Neurologic Neurologic evaluation reveals -alert and oriented x 3 with no impairment of recent or remote memory. Mental Status-Normal.  Musculoskeletal Normal Exam - Left-Upper Extremity Strength Normal and Lower Extremity Strength Normal. Normal Exam - Right-Upper Extremity Strength Normal and Lower Extremity Strength Normal.  Lymphatic Head & Neck  General Head & Neck Lymphatics: Bilateral - Description - Normal. Axillary  General Axillary Region: Bilateral - Description - Normal. Tenderness - Non Tender.    Assessment & Plan (Saphire Barnhart A. Denese Mentink MD; 04/23/2019 12:49 PM)  BREAST CANCER, RIGHT (C50.911) Impression: Stage I Discussed breast conserving surgery with lumpectomy and axillary recent lymph node mapping. She is scheduled for a flexible sigmoidoscopy later in September due to fecal continence issues and a possible cyst. I discussed this with Dr. Marcello Hansen today in the office and she states her procedure conservatively be moved or postponed if necessary given her circumstances.   Risk of lumpectomy include bleeding, infection, seroma, more surgery, use of seed/wire, wound care, cosmetic deformity and the need for other treatments, death , blood clots, death. Pt agrees to proceed. Risk of sentinel lymph node mapping include bleeding, infection, lymphedema, shoulder pain. stiffness, dye allergy. cosmetic deformity , blood clots, death, need for more surgery. Pt agrees to proceed.  Current Plans You are being scheduled for surgery- Our schedulers will call you.  You should hear from our office's scheduling department within 5 working days about the location, date, and time of  surgery. We try to make accommodations for patient's preferences in scheduling surgery, but sometimes the OR schedule or the  surgeon's schedule prevents [place] from making those accommodations.  If you have not heard from our office [phone]) in 5 working days, call the office and ask for your surgeon's nurse.  If you have other questions about your diagnosis, plan, or surgery, call the office and ask for your surgeon's nurse.  Pt Education - [facility] Breast Cancer Information Given - Alight "Breast Journey" Package We discussed the staging and pathophysiology of breast cancer. We discussed all of the different options for treatment for breast cancer including surgery, chemotherapy, radiation therapy, Herceptin, and antiestrogen therapy. We discussed a sentinel lymph node biopsy as she does not appear to having lymph node involvement right now. We discussed the performance of that with injection of radioactive tracer and blue dye. We discussed that she would have an incision underneath her axillary hairline. We discussed that there is a bout a 10-20% chance of having a positive node with a sentinel lymph node biopsy and we will await the permanent pathology to make any other first further decisions in terms of her treatment. One of these options might be to return to the operating room to perform an axillary lymph node dissection. We discussed about a 1-2% risk lifetime of chronic shoulder pain as well as lymphedema associated with a sentinel lymph node biopsy. We discussed the options for treatment of the breast cancer which included lumpectomy versus a mastectomy. We discussed the performance of the lumpectomy with a wire placement. We discussed a 10-20% chance of a positive margin requiring reexcision in the operating room. We also discussed that she may need radiation therapy or antiestrogen therapy or both if she undergoes lumpectomy. We discussed the mastectomy and the postoperative care for that as  well. We discussed that there is no difference in her survival whether she undergoes lumpectomy with radiation therapy or antiestrogen therapy versus a mastectomy. There is a slight difference in the local recurrence rate being 3-5% with lumpectomy and about 1% with a mastectomy. We discussed the risks of operation including bleeding, infection, possible reoperation. She understands her further therapy will be based on what her stages at the time of her operation.  Pt Education - flb breast cancer surgery: discussed with patient and provided information. Pt Education - [facility] Breast Biopsy HCI: discussed with patient and provided information. Pt Education - ABC (After Breast Cancer) Class Info: discussed with patient and provided information.

## 2019-04-24 ENCOUNTER — Other Ambulatory Visit: Payer: Self-pay | Admitting: Surgery

## 2019-04-24 ENCOUNTER — Telehealth: Payer: Self-pay | Admitting: Radiation Oncology

## 2019-04-24 DIAGNOSIS — C50911 Malignant neoplasm of unspecified site of right female breast: Secondary | ICD-10-CM

## 2019-04-24 NOTE — Telephone Encounter (Signed)
New message:    LVM for patient to call back and schedule appt from referral received.

## 2019-05-01 ENCOUNTER — Ambulatory Visit
Admission: RE | Admit: 2019-05-01 | Discharge: 2019-05-01 | Disposition: A | Discharge status: Home / Self Care | Payer: Medicare Other | Source: Ambulatory Visit | Attending: Radiation Oncology | Admitting: Radiation Oncology

## 2019-05-01 ENCOUNTER — Other Ambulatory Visit: Payer: Self-pay

## 2019-05-01 ENCOUNTER — Encounter: Payer: Self-pay | Admitting: Radiation Oncology

## 2019-05-01 DIAGNOSIS — C50911 Malignant neoplasm of unspecified site of right female breast: Secondary | ICD-10-CM | POA: Insufficient documentation

## 2019-05-01 DIAGNOSIS — Z171 Estrogen receptor negative status [ER-]: Secondary | ICD-10-CM

## 2019-05-01 DIAGNOSIS — C50211 Malignant neoplasm of upper-inner quadrant of right female breast: Secondary | ICD-10-CM

## 2019-05-01 NOTE — Progress Notes (Signed)
Location of Breast Cancer: Malignant neoplasm of upper inner quadrant of right breast, ER - - -  Did patient present with symptoms (if so, please note symptoms) or was this found on screening mammography?: Routine mammogram.  Ultrasound 04/12/2019: Single irregular masses measure 0.8 x 0.4 x 0.5 cm and 0.4 x 0.4 x 0.4 cm, respectively, masses are at 1 o'clock position 3 cm from the nipple.  Right axilla was negative for lymphadenopathy.  Diagnostic Mammogram 04/12/2019: Irregular mass spanning approximately 9 mm.  Screening Mammogram 04/10/2019: 2 masses right breast at 1:00 which were adjacent to each other.  The total area measured 1.1 cm.  Histology per Pathology Report: Right Breast 04/16/2019  Receptor Status: ER(0% -), PR (0% -), Her2-neu (-), Ki-67(20%)    Past/Anticipated interventions by surgeon, if any: Dr. Brantley Stage 04/23/2019 -Discussed breast conserving surgery with lumpectomy and axillary recent lymph node mapping.  Patient agrees to proceed. -Right breast lumpectomy with radioactive seed and sentinel lymph node mapping scheduled 05/15/2019   Past/Anticipated interventions by medical oncology, if any: Chemotherapy  Dr. Jana Hakim 05/07/2019  Lymphedema issues, if any:  No  Pain issues, if any: No  SAFETY ISSUES:  Prior radiation? No  Pacemaker/ICD? No  Possible current pregnancy? Hysterectomy  Is the patient on methotrexate? No  Current Complaints / other details:   -Sister diagnosed with breast cancer at 30.    Cori Razor, RN 05/01/2019,7:24 AM

## 2019-05-01 NOTE — Progress Notes (Addendum)
Radiation Oncology         [phone] ________________________________  Name: Joanne Hansen        MRN: [id number]  Date of Service: 05/01/2019 DOB: Mar 13, 1946  [id number], Steele Berg, MD  Erroll Luna, MD     REFERRING PHYSICIAN: Erroll Luna, MD   DIAGNOSIS: The encounter diagnosis was Malignant neoplasm of upper-inner quadrant of right breast in female, estrogen receptor negative ([place]).   HISTORY OF PRESENT ILLNESS: Joanne Hansen is a 73 y.o. female with a new diagnosis of right breast cancer. The patient was noted to have a screening detected mass in the 1:00 position of the right breast. Further diagnostic workup revealed an 8 x 5 x 4 mm, and a 4 x 4 x 4 mm mass like change, it was possible the two were contiguous. Her axilla was negative for adenopathy. 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 She is planning lumpectomy sentinel node biopsy on 05/17/2019 with Dr. Brantley Stage, and is seeing Dr. Jana Hakim on 05/07/2019. She is contacted and seen via MyChart to discus treatment recommendations for her cancer. Of note she is also being worked up for an anorectal cyst and is scheduled for EUA with possible resection of this on 05/15/2019.     PREVIOUS RADIATION THERAPY: No   PAST MEDICAL HISTORY:  Past Medical History:  Diagnosis Date   A-fib ([place])    Arthritis    Fecal incontinence    was in clinical trial for Solesta without benefit.   Frequent headaches    Goiter    Hyperlipidemia    Syncope    Urine incontinence    UTI (urinary tract infection)        PAST SURGICAL HISTORY: Past Surgical History:  Procedure Laterality Date   ABDOMINAL HYSTERECTOMY  1978   endometriosis, left ovary and tube remain. No hx of abnormal pap smears   APPENDECTOMY  1978   BREAST SURGERY  2011   biospy of right breast   CATARACT EXTRACTION Bilateral 2004   GLUTEUS  MINIMUS REPAIR  2017   LAMINECTOMY  2013   L5S1   ROTATOR CUFF REPAIR  2001   left     FAMILY HISTORY:  Family History  Problem Relation Age of Onset   COPD Mother    Cancer Mother        rectal   Arthritis Mother    Hypertension Mother    Miscarriages / [place] Mother    Stroke Mother 7   Alzheimer's disease Mother 44   Heart disease Father    Heart attack Father 40   Lung disease Father    Arthritis Sister    Breast cancer Sister 93       Lumpectomy   Diabetes Maternal Grandmother    Breast cancer Niece 13       Louise's Daughter     SOCIAL HISTORY:  reports that she has never smoked. She has never used smokeless tobacco. She reports current alcohol use. She reports current drug use. Drug: Amphetamines. The patient is married and lives in [place]. She relocated from [place] about a year ago.    ALLERGIES: Penicillins and Sulfasalazine   MEDICATIONS:  Current Outpatient Medications  Medication Sig Dispense Refill   calcium-vitamin D (OSCAL WITH D) 500-200 MG-UNIT tablet Take 1 tablet by mouth daily.     methylcellulose oral powder Take by mouth daily.     metoprolol  tartrate (LOPRESSOR) 25 MG tablet TAKE 1 2 (ONE HALF) TABLET BY MOUTH TWICE DAILY 90 tablet 1   simvastatin (ZOCOR) 40 MG tablet Take 1 tablet (40 mg total) by mouth daily. 90 tablet 1   vitamin B-12 (CYANOCOBALAMIN) 500 MCG tablet Take 2 tablets (1,000 mcg total) by mouth daily.     No current facility-administered medications for this encounter.      REVIEW OF SYSTEMS: On review of systems, the patient reports that she is doing well overall. She denies any chest pain, shortness of breath, cough, fevers, chills, night sweats, unintended weight changes. She denies any bowel or bladder disturbances, and denies abdominal pain, nausea or vomiting. She denies any new musculoskeletal or joint aches or pains. A complete review of systems is obtained and is otherwise negative.       PHYSICAL EXAM:  Wt Readings from Last 3 Encounters:  05/01/19 128 lb (58.1 kg)  11/08/18 128 lb (58.1 kg)    In general this is a well appearing caucasian female in no acute distress. She's alert and oriented x4 and appropriate throughout the examination. Cardiopulmonary assessment is negative for acute distress and she exhibits normal effort. Breast exam is deferred.   ECOG = 0  0 - Asymptomatic (Fully active, able to carry on all predisease activities without restriction)  1 - Symptomatic but completely ambulatory (Restricted in physically strenuous activity but ambulatory and able to carry out work of a light or sedentary nature. For example, light housework, office work)  2 - Symptomatic, <50% in bed during the day (Ambulatory and capable of all self care but unable to carry out any work activities. Up and about more than 50% of waking hours)  3 - Symptomatic, >50% in bed, but not bedbound (Capable of only limited self-care, confined to bed or chair 50% or more of waking hours)  4 - Bedbound (Completely disabled. Cannot carry on any self-care. Totally confined to bed or chair)  5 - Death   Eustace Pen MM, Creech RH, Tormey DC, et al. [phone]). "Toxicity and response criteria of the [facility]". [place] Oncol. 5 (6): 649-55    LABORATORY DATA:  No results found for: WBC, HGB, HCT, MCV, PLT No results found for: NA, K, CL, CO2 No results found for: ALT, AST, GGT, ALKPHOS, BILITOT    RADIOGRAPHY: US Breast Ltd Uni Right Inc Axilla  Result Date: 04/12/2019 CLINICAL DATA:  72 year old patient recalled from recent screening mammogram for evaluation a possible asymmetry. EXAM: DIGITAL DIAGNOSTIC RIGHT MAMMOGRAM WITH TOMO ULTRASOUND RIGHT BREAST COMPARISON:  Screening mammogram April 10, 2019 and earlier priors ACR Breast Density Category b: There are scattered areas of fibroglandular density. FINDINGS: Spot compression views of the medial right breast,  including tomography confirm an irregular mass spanning approximately 9 mm. On physical exam, no mass is palpated in the upper inner quadrant of the right breast. Targeted ultrasound is performed, showing a single irregular mass versus two immediately adjacent small irregular masses, that in total measure 1.1 cm. Individually, the two irregular masses measure 0.8 x 0.4 x 0.5 cm and 0.4 x 0.4 x 0.4 cm, respectively. Mass(es) are at 1 o'clock position 3 cm from the nipple. Ultrasound of the right axilla is negative for lymphadenopathy. IMPRESSION: Two immediately adjacent suspicious masses in the 1 o'clock position of the right breast 3 cm from the nipple. In total, these measure approximately 1.1 cm. RECOMMENDATION: Ultrasound-guided biopsy (single site) of the suspicious findings at 1 o'clock position in  the right breast. I have discussed the findings and recommendations with the patient. Results were also provided in writing at the conclusion of the visit. If applicable, a reminder letter will be sent to the patient regarding the next appointment. BI-RADS CATEGORY  5: Highly suggestive of malignancy. Electronically Signed   By: Curlene Dolphin M.D.   On: 04/12/2019 16:26   Mm Diag Breast Tomo Uni Right  Result Date: 04/12/2019 CLINICAL DATA:  73 year old patient recalled from recent screening mammogram for evaluation a possible asymmetry. EXAM: DIGITAL DIAGNOSTIC RIGHT MAMMOGRAM WITH TOMO ULTRASOUND RIGHT BREAST COMPARISON:  Screening mammogram April 10, 2019 and earlier priors ACR Breast Density Category b: There are scattered areas of fibroglandular density. FINDINGS: Spot compression views of the medial right breast, including tomography confirm an irregular mass spanning approximately 9 mm. On physical exam, no mass is palpated in the upper inner quadrant of the right breast. Targeted ultrasound is performed, showing a single irregular mass versus two immediately adjacent small irregular masses, that in  total measure 1.1 cm. Individually, the two irregular masses measure 0.8 x 0.4 x 0.5 cm and 0.4 x 0.4 x 0.4 cm, respectively. Mass(es) are at 1 o'clock position 3 cm from the nipple. Ultrasound of the right axilla is negative for lymphadenopathy. IMPRESSION: Two immediately adjacent suspicious masses in the 1 o'clock position of the right breast 3 cm from the nipple. In total, these measure approximately 1.1 cm. RECOMMENDATION: Ultrasound-guided biopsy (single site) of the suspicious findings at 1 o'clock position in the right breast. I have discussed the findings and recommendations with the patient. Results were also provided in writing at the conclusion of the visit. If applicable, a reminder letter will be sent to the patient regarding the next appointment. BI-RADS CATEGORY  5: Highly suggestive of malignancy. Electronically Signed   By: Curlene Dolphin M.D.   On: 04/12/2019 16:26   Mm 3d Screen Breast Bilateral  Result Date: 04/10/2019 CLINICAL DATA:  Screening. EXAM: DIGITAL SCREENING BILATERAL MAMMOGRAM WITH TOMO AND CAD COMPARISON:  Previous exam(s). ACR Breast Density Category b: There are scattered areas of fibroglandular density. FINDINGS: In the right breast, a possible asymmetry warrants further evaluation. In the left breast, no findings suspicious for malignancy. Images were processed with CAD. IMPRESSION: Further evaluation is suggested for possible asymmetry in the right breast. RECOMMENDATION: Diagnostic mammogram and possibly ultrasound of the right breast. (Code:[id number]) The patient will be contacted regarding the findings, and additional imaging will be scheduled. BI-RADS CATEGORY  0: Incomplete. Need additional imaging evaluation and/or prior mammograms for comparison. Electronically Signed   By: Audie Pinto M.D.   On: 04/10/2019 16:53   Mm Clip Placement Right  Result Date: 04/16/2019 CLINICAL DATA:  Evaluate biopsy marker EXAM: DIAGNOSTIC RIGHT MAMMOGRAM POST ULTRASOUND BIOPSY  COMPARISON:  Previous exam(s). FINDINGS: Mammographic images were obtained following ultrasound guided biopsy of a right breast mass. The ribbon shaped clip is in good position. IMPRESSION: The ribbon shaped clip is in good position. Final Assessment: Post Procedure Mammograms for Marker Placement Electronically Signed   By: Dorise Bullion III M.D   On: 04/16/2019 08:39   [place] Rt Breast Bx W Loc Dev 1st Lesion Img Bx Spec US Guide  Addendum Date: 04/17/2019   ADDENDUM REPORT: 04/17/2019 14:40 ADDENDUM: Pathology revealed GRADE II INVASIVE DUCTAL CARCINOMA of the Right breast, 1 o'clock. This was found to be concordant by Dr. Dorise Bullion. Pathology results were discussed with the patient by telephone by Stacie Acres, RN, Nurse Navigator. The  patient reported doing well after the biopsy with tenderness at the site. Post biopsy instructions and care were reviewed and questions were answered. The patient was encouraged to call [facility] for any additional concerns. Surgical consultation has been arranged with Dr. Erroll Luna at [facility] on April 23, 2019. Pathology results reported by Terie Purser, RN on 04/17/2019. Electronically Signed   By: Dorise Bullion III M.D   On: 04/17/2019 14:40   Result Date: 04/17/2019 CLINICAL DATA:  Biopsy of right breast mass EXAM: ULTRASOUND GUIDED RIGHT BREAST CORE NEEDLE BIOPSY COMPARISON:  Previous exam(s). FINDINGS: I met with the patient and we discussed the procedure of ultrasound-guided biopsy, including benefits and alternatives. We discussed the high likelihood of a successful procedure. We discussed the risks of the procedure, including infection, bleeding, tissue injury, clip migration, and inadequate sampling. Informed written consent was given. The usual time-out protocol was performed immediately prior to the procedure. Lesion quadrant: Upper inner Using sterile technique and 1% Lidocaine as local anesthetic, under  direct ultrasound visualization, a 12 gauge spring-loaded device was used to perform biopsy of a right breast mass at 1 o'clock using a medial approach. At the conclusion of the procedure a tissue marker clip was deployed into the biopsy cavity. Follow up 2 view mammogram was performed and dictated separately. IMPRESSION: Ultrasound guided biopsy of a right breast mass at 1 o'clock. No apparent complications. Electronically Signed: By: Dorise Bullion III M.D On: 04/16/2019 08:30       IMPRESSION/PLAN: 1. Stage IB, [id number] grade 2, triple negative invasive ductal carcinoma of the right breast. Dr. Lisbeth Renshaw discusses the pathology findings and reviews the nature of triple negative, right breast disease. The consensus from the breast conference includes breast conservation with lumpectomy with sentinel node biopsy. Her prognostic panel was not available at the time of her discussion in conference, but given the triple negative component, she may need systemic therapy. She will meet with Dr. Jana Hakim this coming Monday and will discuss this further with him. We discussed the risks, benefits, short, and long term effects of radiotherapy, and the patient is interested in proceeding. Dr. Lisbeth Renshaw discusses the delivery and logistics of radiotherapy and anticipates a course of 4-6 1/2 weeks of radiotherapy, hopefully 4 weeks based on what is known today. We will see her back about 2 weeks after surgery to discuss the simulation process and anticipate we starting radiotherapy about 4-6 weeks after surgery, or about 4 weeks if she needed systemic therapy.  2. Anorectal cyst and fecal urgency. The patient will follow up with Dr. Marcello Moores to determine the timing of her surgery given her breast diagnosis. 3. Possible genetic predisposition to malignancy. The patient is a candidate for genetic testing given her personal and family history. She was offered referral and is interested in meeting with genetics and possible testing  which may influence her surgical decision making.   This encounter was provided by telemedicine platform MyChart.  The patient has given verbal consent for this type of encounter and has been advised to only accept a meeting of this type in a secure network environment. The time spent during this encounter was 60 minutes. The attendants for this meeting include Blenda Nicely, RN, Dr. Lisbeth Renshaw, Hayden Pedro  and Evalee Jefferson [place].  Mr Gammon her Gaylene Brooks was also on the call.   During the encounter,  Blenda Nicely, RN, Dr. Lisbeth Renshaw, and Hayden Pedro were located at [facility].  Corliss Blacker and her husband Aeriel Boulay were located at their vacation home.   The above documentation reflects my direct findings during this shared patient visit. Please see the separate note by Dr. Lisbeth Renshaw on this date for the remainder of the patient's plan of care.    Carola Rhine, PAC

## 2019-05-02 ENCOUNTER — Telehealth: Payer: Self-pay | Admitting: Licensed Clinical Social Worker

## 2019-05-02 ENCOUNTER — Ambulatory Visit: Payer: Medicare Other | Admitting: Oncology

## 2019-05-02 ENCOUNTER — Other Ambulatory Visit: Payer: Medicare Other

## 2019-05-02 ENCOUNTER — Other Ambulatory Visit: Payer: Self-pay | Admitting: Licensed Clinical Social Worker

## 2019-05-02 DIAGNOSIS — C50911 Malignant neoplasm of unspecified site of right female breast: Secondary | ICD-10-CM

## 2019-05-02 NOTE — Progress Notes (Signed)
Patient having pre-op covid test on 9/19 for 9/21 seed placement and 9/22 surgery with Dr Brantley Stage. Patient also having surgery on 9/25 with Dr Marcello Moores, Covid test will be out of acceptable range.  Patient is not available to be tested  at the 3 day window . OK to be retested on 9/24 and use hologic platform.

## 2019-05-02 NOTE — Telephone Encounter (Signed)
Called patient regarding upcoming Webex appointment, patient is notified and e-mail has been sent. °

## 2019-05-02 NOTE — Telephone Encounter (Signed)
An urgent genetic counseling appt has been scheduled for Joanne Hansen to see Faith Rogue on 9/10 at 11am.

## 2019-05-02 NOTE — Progress Notes (Signed)
Order for genetic testing

## 2019-05-03 ENCOUNTER — Encounter: Payer: Self-pay | Admitting: *Deleted

## 2019-05-03 ENCOUNTER — Encounter: Payer: Self-pay | Admitting: Licensed Clinical Social Worker

## 2019-05-03 ENCOUNTER — Inpatient Hospital Stay: Payer: Medicare Other | Attending: Oncology | Admitting: Licensed Clinical Social Worker

## 2019-05-03 ENCOUNTER — Other Ambulatory Visit: Payer: Self-pay

## 2019-05-03 DIAGNOSIS — C50211 Malignant neoplasm of upper-inner quadrant of right female breast: Secondary | ICD-10-CM | POA: Insufficient documentation

## 2019-05-03 DIAGNOSIS — Z171 Estrogen receptor negative status [ER-]: Secondary | ICD-10-CM | POA: Insufficient documentation

## 2019-05-03 DIAGNOSIS — Z800 Family history of malignant neoplasm of digestive organs: Secondary | ICD-10-CM

## 2019-05-03 DIAGNOSIS — Z803 Family history of malignant neoplasm of breast: Secondary | ICD-10-CM

## 2019-05-03 DIAGNOSIS — I4891 Unspecified atrial fibrillation: Secondary | ICD-10-CM | POA: Insufficient documentation

## 2019-05-03 DIAGNOSIS — Z808 Family history of malignant neoplasm of other organs or systems: Secondary | ICD-10-CM

## 2019-05-03 DIAGNOSIS — R159 Full incontinence of feces: Secondary | ICD-10-CM | POA: Insufficient documentation

## 2019-05-03 NOTE — Progress Notes (Signed)
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 She underwent unilateral right diagnostic mammography with tomography and right breast ultrasonography at [place] on 04/12/2019 showing: Breast Density Category B. Spot compression views of the medial right breast, including tomography confirm an irregular mass spanning approximately 0.9 cm. On physical exam, no mass is palpated in the upper inner quadrant of the right breast. Targeted ultrasound is performed, showing a single irregular mass versus two immediately adjacent small irregular masses, that in total measure 1.1 cm. Individually, the two irregular masses measure 0.8 x 0.4 x 0.5 cm and 0.4 x 0.4 x 0.4 cm, respectively. Mass(es) are at 1 o'clock position 3 cm from the nipple. Ultrasound of the right axilla is negative for lymphadenopathy.  Accordingly on 04/16/2019 she proceeded to biopsy of the right breast area in question. The pathology from this procedure showed ([id number]): invasive ductal carcinoma, grade II. Prognostic indicators significant for: estrogen receptor, 0% negative and progesterone receptor, 0% negative,. Proliferation marker Ki67 at 20%. HER2  negative (1+) by immunohistochemistry.  The patient's subsequent history is as detailed below.   INTERVAL HISTORY: Dyllan was evaluated in the breast cancer clinic on 05/07/2019.   Her case was also presented at the multidisciplinary breast cancer conference on 04/25/2019. At that time a preliminary plan was proposed: Breast conserving surgery consideration of adjuvant chemotherapy, adjuvant radiation, consider prophylactic antiestrogens   REVIEW OF SYSTEMS: There were no specific symptoms leading to the original mammogram, which was routinely scheduled. The patient denies unusual headaches, visual changes, nausea, vomiting, stiff neck, dizziness, or gait imbalance. There has been no cough, phlegm production, or pleurisy, no chest pain or pressure, and no change in bowel or bladder habits. The patient denies fever, rash, bleeding, unexplained fatigue or unexplained weight loss.  The patient has an excellent diet (essentially vegan plus eggs and fish) and exercises by walking several times a week.  A detailed review of systems was otherwise entirely negative.    PAST MEDICAL HISTORY: Past Medical History:  Diagnosis Date   A-fib [facility])    Arthritis    Family history of brain cancer    Family history of brain cancer    Family history of breast cancer    Family history of colon cancer    Fecal incontinence    was in clinical trial for [facility] without benefit.   Frequent headaches    Goiter    Hyperlipidemia    Syncope    Urine incontinence    UTI (urinary tract infection)      PAST SURGICAL HISTORY: Past Surgical History:  Procedure Laterality Date   ABDOMINAL HYSTERECTOMY  1978   endometriosis, left ovary and tube remain. No hx of abnormal pap smears   APPENDECTOMY  1978   BREAST SURGERY  2011   biospy of right breast   CATARACT EXTRACTION Bilateral 2004  GLUTEUS MINIMUS REPAIR  2017   LAMINECTOMY  2013   L5S1   ROTATOR CUFF REPAIR  2001   left      FAMILY HISTORY: Family History  Problem Relation Age of Onset   COPD Mother    Cancer Mother 65       colon   Arthritis Mother    Hypertension Mother    Miscarriages / [place] Mother    Stroke Mother 36   Alzheimer's disease Mother 72   Heart disease Father    Heart attack Father 59   Lung disease Father    Arthritis Sister    Breast cancer Sister 67       Lumpectomy   Diabetes Maternal Grandmother    Breast cancer Niece 39       Louise's Daughter   Cervical cancer Niece 44   The patient's father died at age 76 from a heart attack.  The patient's mother died at age 3; she had a gastrointestinal cancer diagnosed age 38.  The patient has 1 sister with breast cancer and one niece with breast cancer diagnosed at age 36.   GYNECOLOGIC HISTORY:  No LMP recorded. Patient has had a hysterectomy. 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  Her husband Germain Osgood (goes by "Ronald Pippins") is a retired Chief Financial Officer.  Their daughter Johnanna Schneiders (currently divorced) lives in [place].  The patient has no grandchildren.  She is not a church attender   ADVANCED DIRECTIVES: The patient's husband is her healthcare power of attorney   HEALTH MAINTENANCE: Social History   Tobacco Use   Smoking status: Never Smoker   Smokeless tobacco: Never Used  Substance Use Topics   Alcohol use: Yes   Drug use: Yes    Types: Amphetamines    Colonoscopy: Due 2022  PAP: Status post hysterectomy  Bone density: Pending   Allergies  Allergen Reactions   Penicillins Swelling   Sulfasalazine Nausea And Vomiting    Current Outpatient Medications  Medication Sig Dispense Refill   Calcium-Phosphorus-Vitamin D (CITRACAL CALCIUM GUMMIES  PO) Take 2 each by mouth daily.     Cyanocobalamin (VITAMIN B 12 PO) Take 1,000 mg by mouth daily. sublingal     methylcellulose oral powder Take by mouth daily.     metoprolol tartrate (LOPRESSOR) 25 MG tablet TAKE 1 2 (ONE HALF) TABLET BY MOUTH TWICE DAILY 90 tablet 1   simvastatin (ZOCOR) 40 MG tablet Take 1 tablet (40 mg total) by mouth daily. 90 tablet 1   No current facility-administered medications for this visit.      OBJECTIVE: Middle-aged white woman who appears stated age  73:   05/07/19 1609  BP: (!) 149/79  Pulse: 71  Resp: 20  Temp: 98.7 F (37.1 C)  SpO2: 100%   Wt Readings from Last 3 Encounters:  05/07/19 128 lb 14.4 oz (58.5 kg)  05/01/19 128 lb (58.1 kg)  11/08/18 128 lb (58.1 kg)   Body mass index is 23.58 kg/m.    ECOG FS:1 - Symptomatic but completely ambulatory  Ocular: Sclerae unicteric, pupils round and equal Ear-nose-throat: Oropharynx clear and moist Lymphatic: No cervical or supraclavicular adenopathy Lungs no rales or rhonchi Heart regular rate and rhythm Abd soft, nontender, positive bowel sounds MSK no focal spinal tenderness,  no joint edema Neuro: non-focal, well-oriented, appropriate affect Breasts: The right breast is status post recent biopsy.  There is a minimal ecchymosis.  The left breast is benign.  Both axillae are benign.   LAB RESULTS:  CMP     Component Value Date/Time   NA 140 05/07/2019 1547   K 4.1 05/07/2019 1547   CL 105 05/07/2019 1547   CO2 28 05/07/2019 1547   GLUCOSE 92 05/07/2019 1547   BUN 20 05/07/2019 1547   CREATININE 0.77 05/07/2019 1547   CALCIUM 9.1 05/07/2019 1547   PROT 6.8 05/07/2019 1547   ALBUMIN 4.2 05/07/2019 1547   AST 19 05/07/2019 1547   ALT 13 05/07/2019 1547   ALKPHOS 71 05/07/2019 1547   BILITOT 0.5 05/07/2019 1547   GFRNONAA >60 05/07/2019 1547   GFRAA >60 05/07/2019 1547    No results found for: TOTALPROTELP, ALBUMINELP, A1GS, A2GS, BETS, BETA2SER, GAMS, MSPIKE,  SPEI  No results found for: KPAFRELGTCHN, LAMBDASER, KAPLAMBRATIO  Lab Results  Component Value Date   WBC 6.7 05/07/2019   NEUTROABS 4.2 05/07/2019   HGB 13.1 05/07/2019   HCT 39.8 05/07/2019   MCV 99.5 05/07/2019   PLT 240 05/07/2019    No results found for: LABCA2  No components found for: IEPPIR518  No results for input(s): INR in the last 168 hours.  No results found for: LABCA2  No results found for: ACZ660  No results found for: YTK160  No results found for: FUX323  No results found for: CA2729  No components found for: HGQUANT  No results found for: CEA1 / No results found for: CEA1   No results found for: AFPTUMOR  No results found for: CHROMOGRNA  No results found for: PSA1  Appointment on 05/07/2019  Component Date Value Ref Range Status   Sodium 05/07/2019 140  135 - 145 mmol/L Final   Potassium 05/07/2019 4.1  3.5 - 5.1 mmol/L Final   Chloride 05/07/2019 105  98 - 111 mmol/L Final   CO2 05/07/2019 28  22 - 32 mmol/L Final   Glucose, Bld 05/07/2019 92  70 - 99 mg/dL Final   BUN 05/07/2019 20  8 - 23 mg/dL Final   Creatinine 05/07/2019 0.77  0.44 - 1.00 mg/dL Final   Calcium 05/07/2019 9.1  8.9 - 10.3 mg/dL Final   Total Protein 05/07/2019 6.8  6.5 - 8.1 g/dL Final   Albumin 05/07/2019 4.2  3.5 - 5.0 g/dL Final   AST 05/07/2019 19  15 - 41 U/L Final   ALT 05/07/2019 13  0 - 44 U/L Final   Alkaline Phosphatase 05/07/2019 71  38 - 126 U/L Final   Total Bilirubin 05/07/2019 0.5  0.3 - 1.2 mg/dL Final   GFR, Est Non Af Am 05/07/2019 >60  >60 mL/min Final   GFR, Est AFR Am 05/07/2019 >60  >60 mL/min Final   Anion gap 05/07/2019 7  5 - 15 Final   Performed at [facility], [place] [address] [place], [place] [address]   WBC Count 05/07/2019 6.7  4.0 - 10.5 K/uL Final   RBC 05/07/2019 4.00  3.87 - 5.11 MIL/uL Final   Hemoglobin 05/07/2019 13.1  12.0 - 15.0 g/dL Final   HCT 05/07/2019 39.8  36.0 - 46.0 %  Final   MCV 05/07/2019 99.5  80.0 - 100.0 fL Final   MCH 05/07/2019 32.8  26.0 - 34.0 pg Final   MCHC 05/07/2019 32.9  30.0 - 36.0 g/dL Final   RDW  05/07/2019 12.4  11.5 - 15.5 % Final   Platelet Count 05/07/2019 240  150 - 400 K/uL Final   nRBC 05/07/2019 0.0  0.0 - 0.2 % Final   Neutrophils Relative % 05/07/2019 62  % Final   Neutro Abs 05/07/2019 4.2  1.7 - 7.7 K/uL Final   Lymphocytes Relative 05/07/2019 26  % Final   Lymphs Abs 05/07/2019 1.7  0.7 - 4.0 K/uL Final   Monocytes Relative 05/07/2019 9  % Final   Monocytes Absolute 05/07/2019 0.6  0.1 - 1.0 K/uL Final   Eosinophils Relative 05/07/2019 2  % Final   Eosinophils Absolute 05/07/2019 0.1  0.0 - 0.5 K/uL Final   Basophils Relative 05/07/2019 1  % Final   Basophils Absolute 05/07/2019 0.0  0.0 - 0.1 K/uL Final   Immature Granulocytes 05/07/2019 0  % Final   Abs Immature Granulocytes 05/07/2019 0.01  0.00 - 0.07 K/uL Final   Performed at [facility], [place] [address] [place], [place] [address]    (this displays the last labs from the last 3 days)  No results found for: TOTALPROTELP, ALBUMINELP, A1GS, A2GS, BETS, BETA2SER, GAMS, MSPIKE, SPEI (this displays SPEP labs)  No results found for: KPAFRELGTCHN, LAMBDASER, KAPLAMBRATIO (kappa/lambda light chains)  No results found for: HGBA, HGBA2QUANT, HGBFQUANT, HGBSQUAN (Hemoglobinopathy evaluation)   No results found for: LDH  No results found for: IRON, TIBC, IRONPCTSAT (Iron and TIBC)  No results found for: FERRITIN  Urinalysis No results found for: COLORURINE, APPEARANCEUR, LABSPEC, PHURINE, GLUCOSEU, HGBUR, BILIRUBINUR, KETONESUR, PROTEINUR, UROBILINOGEN, NITRITE, LEUKOCYTESUR   STUDIES:  US Breast Ltd Uni Right Inc Axilla  Result Date: 04/12/2019 CLINICAL DATA:  73 year old patient recalled from recent screening mammogram for evaluation a possible asymmetry. EXAM: DIGITAL DIAGNOSTIC RIGHT MAMMOGRAM WITH TOMO ULTRASOUND  RIGHT BREAST COMPARISON:  Screening mammogram April 10, 2019 and earlier priors ACR Breast Density Category b: There are scattered areas of fibroglandular density. FINDINGS: Spot compression views of the medial right breast, including tomography confirm an irregular mass spanning approximately 9 mm. On physical exam, no mass is palpated in the upper inner quadrant of the right breast. Targeted ultrasound is performed, showing a single irregular mass versus two immediately adjacent small irregular masses, that in total measure 1.1 cm. Individually, the two irregular masses measure 0.8 x 0.4 x 0.5 cm and 0.4 x 0.4 x 0.4 cm, respectively. Mass(es) are at 1 o'clock position 3 cm from the nipple. Ultrasound of the right axilla is negative for lymphadenopathy. IMPRESSION: Two immediately adjacent suspicious masses in the 1 o'clock position of the right breast 3 cm from the nipple. In total, these measure approximately 1.1 cm. RECOMMENDATION: Ultrasound-guided biopsy (single site) of the suspicious findings at 1 o'clock position in the right breast. I have discussed the findings and recommendations with the patient. Results were also provided in writing at the conclusion of the visit. If applicable, a reminder letter will be sent to the patient regarding the next appointment. BI-RADS CATEGORY  5: Highly suggestive of malignancy. Electronically Signed   By: Curlene Dolphin M.D.   On: 04/12/2019 16:26   Mm Diag Breast Tomo Uni Right  Result Date: 04/12/2019 CLINICAL DATA:  73 year old patient recalled from recent screening mammogram for evaluation a possible asymmetry. EXAM: DIGITAL DIAGNOSTIC RIGHT MAMMOGRAM WITH TOMO ULTRASOUND RIGHT BREAST COMPARISON:  Screening mammogram April 10, 2019 and earlier priors ACR Breast Density Category b: There are scattered areas of fibroglandular density. FINDINGS: Spot compression views of the medial right  breast, including tomography confirm an irregular mass spanning approximately 9  mm. On physical exam, no mass is palpated in the upper inner quadrant of the right breast. Targeted ultrasound is performed, showing a single irregular mass versus two immediately adjacent small irregular masses, that in total measure 1.1 cm. Individually, the two irregular masses measure 0.8 x 0.4 x 0.5 cm and 0.4 x 0.4 x 0.4 cm, respectively. Mass(es) are at 1 o'clock position 3 cm from the nipple. Ultrasound of the right axilla is negative for lymphadenopathy. IMPRESSION: Two immediately adjacent suspicious masses in the 1 o'clock position of the right breast 3 cm from the nipple. In total, these measure approximately 1.1 cm. RECOMMENDATION: Ultrasound-guided biopsy (single site) of the suspicious findings at 1 o'clock position in the right breast. I have discussed the findings and recommendations with the patient. Results were also provided in writing at the conclusion of the visit. If applicable, a reminder letter will be sent to the patient regarding the next appointment. BI-RADS CATEGORY  5: Highly suggestive of malignancy. Electronically Signed   By: Curlene Dolphin M.D.   On: 04/12/2019 16:26   Mm 3d Screen Breast Bilateral  Result Date: 04/10/2019 CLINICAL DATA:  Screening. EXAM: DIGITAL SCREENING BILATERAL MAMMOGRAM WITH TOMO AND CAD COMPARISON:  Previous exam(s). ACR Breast Density Category b: There are scattered areas of fibroglandular density. FINDINGS: In the right breast, a possible asymmetry warrants further evaluation. In the left breast, no findings suspicious for malignancy. Images were processed with CAD. IMPRESSION: Further evaluation is suggested for possible asymmetry in the right breast. RECOMMENDATION: Diagnostic mammogram and possibly ultrasound of the right breast. (Code:[id number]) The patient will be contacted regarding the findings, and additional imaging will be scheduled. BI-RADS CATEGORY  0: Incomplete. Need additional imaging evaluation and/or prior mammograms for comparison.  Electronically Signed   By: Audie Pinto M.D.   On: 04/10/2019 16:53   Mm Clip Placement Right  Result Date: 04/16/2019 CLINICAL DATA:  Evaluate biopsy marker EXAM: DIAGNOSTIC RIGHT MAMMOGRAM POST ULTRASOUND BIOPSY COMPARISON:  Previous exam(s). FINDINGS: Mammographic images were obtained following ultrasound guided biopsy of a right breast mass. The ribbon shaped clip is in good position. IMPRESSION: The ribbon shaped clip is in good position. Final Assessment: Post Procedure Mammograms for Marker Placement Electronically Signed   By: Dorise Bullion III M.D   On: 04/16/2019 08:39   [place] Rt Breast Bx W Loc Dev 1st Lesion Img Bx Spec US Guide  Addendum Date: 04/17/2019   ADDENDUM REPORT: 04/17/2019 14:40 ADDENDUM: Pathology revealed GRADE II INVASIVE DUCTAL CARCINOMA of the Right breast, 1 o'clock. This was found to be concordant by Dr. Dorise Bullion. Pathology results were discussed with the patient by telephone by Stacie Acres, RN, Nurse Navigator. The patient reported doing well after the biopsy with tenderness at the site. Post biopsy instructions and care were reviewed and questions were answered. The patient was encouraged to call [facility] for any additional concerns. Surgical consultation has been arranged with Dr. Erroll Luna at [facility] on April 23, 2019. Pathology results reported by Terie Purser, RN on 04/17/2019. Electronically Signed   By: Dorise Bullion III M.D   On: 04/17/2019 14:40   Result Date: 04/17/2019 CLINICAL DATA:  Biopsy of right breast mass EXAM: ULTRASOUND GUIDED RIGHT BREAST CORE NEEDLE BIOPSY COMPARISON:  Previous exam(s). FINDINGS: I met with the patient and we discussed the procedure of ultrasound-guided biopsy, including benefits and alternatives. We discussed the high likelihood of  a successful procedure. We discussed the risks of the procedure, including infection, bleeding, tissue injury, clip migration, and inadequate  sampling. Informed written consent was given. The usual time-out protocol was performed immediately prior to the procedure. Lesion quadrant: Upper inner Using sterile technique and 1% Lidocaine as local anesthetic, under direct ultrasound visualization, a 12 gauge spring-loaded device was used to perform biopsy of a right breast mass at 1 o'clock using a medial approach. At the conclusion of the procedure a tissue marker clip was deployed into the biopsy cavity. Follow up 2 view mammogram was performed and dictated separately. IMPRESSION: Ultrasound guided biopsy of a right breast mass at 1 o'clock. No apparent complications. Electronically Signed: By: Dorise Bullion III M.D On: 04/16/2019 08:30     ELIGIBLE FOR AVAILABLE RESEARCH PROTOCOL: NO   ASSESSMENT: 73 y.o. [place], [place] woman status post right breast upper quadrant biopsy for an mT1b (or single t1c0 invasive ductal carcinoma, grade 2, triple negative, with an MIB-1-1 of 20%.  (1) genetics testing 05/07/2019 g  (2) definitive surgery pending  (3) adjuvant chemotherapy to consist of cyclophosphamide and docetaxel every 21 days x 4, first dose May 29, 2019  (4) adjuvant radiation to follow  (5) consider prophylactic antiestrogens  PLAN: I spent approximately 60 minutes face to face with Noam with more than 50% of that time spent in counseling and coordination of care. Specifically we reviewed the biology of the patient's diagnosis and the specifics of her situation. We first reviewed the fact that cancer is not one disease but more than 100 different diseases and that it is important to keep them separate-- otherwise when friends and relatives discuss their own cancer experiences with [place] confusion can result. Similarly we explained that if breast cancer spreads to the bone or liver, the patient would not have bone cancer or liver cancer, but breast cancer in the bone and breast cancer in the liver: one cancer in three places--  not 3 different cancers which otherwise would have to be treated in 3 different ways.  We discussed the difference between local and systemic therapy. In terms of loco-regional treatment, lumpectomy plus radiation is equivalent to mastectomy as far as survival is concerned. For this reason, and because the cosmetic results are generally superior, we recommend breast conserving surgery.  We then discussed the rationale for systemic therapy. There is some risk that this cancer may have already spread to other parts of her body. Patients frequently ask at this point about bone scans, CAT scans and PET scans to find out if they have occult breast cancer somewhere else. The problem is that in early stage disease we are much more likely to find false positives then true cancers and this would expose the patient to unnecessary procedures as well as unnecessary radiation. Scans cannot answer the question the patient really would like to know, which is whether she has microscopic disease elsewhere in her body. For those reasons we do not recommend them.  Of course we would proceed to aggressive evaluation of any symptoms that might suggest metastatic disease, but that is not the case here.  Next we went over the options for systemic therapy which are anti-estrogens, anti-HER-2 immunotherapy, and chemotherapy. Shatarra does not meet criteria for anti-HER-2 immunotherapy or antiestrogens.  Her only choice for systemic therapy is chemotherapy and accordingly that is what we recommend.  More specifically she understands that cyclophosphamide plus Adriamycin followed by paclitaxel is standard of care meaning that there is no better  documented treatment.  In cases like hers however where the cancer is small and node-negative we offer patients the option of cyclophosphamide and docetaxel.  This is only 4 doses given 21 days apart so the treatment is completed in 12 weeks.  We discussed the possible toxicities, side effects  and complications of these agents in detail today.  This is an intense treatment and there is a 6% reported rate of permanent baldness (usually a bald spot rather than complete).  Accordingly I recommend my patients receiving this to consider the cryotherapy with Digna cap.  Amit is very interested and we will try to facilitate that for her.  We also discussed port placement which I recommended.  This can be done at the same time as her definitive surgery and I have sent Dr. Brantley Stage and note regarding this  I am hoping we can start her chemotherapy October 6.  Timesha has a good understanding of the overall plan. She agrees with it. She knows the goal of treatment in her case is cure. She will call with any problems that may develop before her next visit here.    Herbert Marken, Virgie Dad, MD  05/07/19 5:24 PM Medical Oncology and Hematology [facility] [address] [place],  [address] Tel. [phone]    Fax. [phone]   I, Jacqualyn Posey am acting as a Education administrator for Chauncey Cruel, MD.   I, Lurline Del MD, have reviewed the above documentation for accuracy and completeness, and I agree with the above.

## 2019-05-03 NOTE — Progress Notes (Signed)
REFERRING PROVIDER: Hayden Pedro, PA-C [place],  [place] [address]  PRIMARY PROVIDER:  Caren Macadam, MD  PRIMARY REASON FOR VISIT:  1. Malignant neoplasm of upper-inner quadrant of right breast in female, estrogen receptor negative ([place])   2. Family history of breast cancer   3. Family history of colon cancer   4. Family history of brain cancer    I connected with Joanne Hansen on 05/03/2019 at 11:00 AM EDT by Jackquline Denmark and verified that I am speaking with the correct person using two identifiers.    Patient location: home Provider location: clinic   HISTORY OF PRESENT ILLNESS:   Joanne Hansen, a 73 y.o. female, was seen for a [facility] cancer genetics consultation due to a personal and family history of cancer.  Joanne Hansen presents to clinic today to discuss the possibility of a hereditary predisposition to cancer, genetic testing, and to further clarify her future cancer risks, as well as potential cancer risks for family members.   In 2020, at the age of 36, Joanne Hansen was diagnosed with IDC of the right breast, triple negative. The treatment plan includes lumpectomy, radiation, and possible chemotherapy. Joanne Hansen reports that genetic test results will not change her surgical decision, she wants lumpectomy.   CANCER HISTORY:  Oncology History   No history exists.    RISK FACTORS:  Menarche was at age 75.  First live birth at age 59.  OCP use for approximately 5 years.  Ovaries intact: she has one ovary.  Hysterectomy: yes.  Menopausal status: postmenopausal.  HRT use: 2 years. Colonoscopy: yes; normal. Mammogram within the last year: yes. Number of breast biopsies: 1.   Past Medical History:  Diagnosis Date  . A-fib ([place])   . Arthritis   . Family history of brain cancer   . Family history of brain cancer   . Family history of breast cancer   . Family history of colon cancer   . Fecal incontinence    was in clinical trial for [facility]  without benefit.  . Frequent headaches   . Goiter   . Hyperlipidemia   . Syncope   . Urine incontinence   . UTI (urinary tract infection)     Past Surgical History:  Procedure Laterality Date  . ABDOMINAL HYSTERECTOMY  1978   endometriosis, left ovary and tube remain. No hx of abnormal pap smears  . APPENDECTOMY  1978  . BREAST SURGERY  2011   biospy of right breast  . CATARACT EXTRACTION Bilateral 2004  . GLUTEUS MINIMUS REPAIR  2017  . LAMINECTOMY  2013   L5S1  . ROTATOR CUFF REPAIR  2001   left    Social History   Socioeconomic History  . Marital status: Married    Spouse name: Not on file  . Number of children: Not on file  . Years of education: Not on file  . Highest education level: Not on file  Occupational History  . Not on file  Social Needs  . Financial resource strain: Not on file  . Food insecurity    Worry: Not on file    Inability: Not on file  . Transportation needs    Medical: No    Non-medical: No  Tobacco Use  . Smoking status: Never Smoker  . Smokeless tobacco: Never Used  Substance and Sexual Activity  . Alcohol use: Yes  . Drug use: Yes    Types: Amphetamines  . Sexual activity: Not on file  Lifestyle  . Physical activity    Days per week: Not on file    Minutes per session: Not on file  . Stress: Not on file  Relationships  . Social Herbalist on phone: Not on file    Gets together: Not on file    Attends religious service: Not on file    Active member of club or organization: Not on file    Attends meetings of clubs or organizations: Not on file    Relationship status: Not on file  Other Topics Concern  . Not on file  Social History Narrative  . Not on file     FAMILY HISTORY:  We obtained a detailed, 4-generation family history.  Significant diagnoses are listed below: Family History  Problem Relation Age of Onset  . COPD Mother   . Cancer Mother 30       colon  . Arthritis Mother   . Hypertension Mother    . Miscarriages / [place] Mother   . Stroke Mother 15  . Alzheimer's disease Mother 69  . Heart disease Father   . Heart attack Father 39  . Lung disease Father   . Arthritis Sister   . Breast cancer Sister 77       Lumpectomy  . Diabetes Maternal Grandmother   . Breast cancer Niece 70       Joanne Hansen's Daughter  . Cervical cancer Niece 33    Joanne Hansen has one daughter, age 58, no history of cancer. She has 3 sisters. One of her sisters, Joanne Hansen, was recently diagnosed with breast cancer at 49. Another sister has a daughter who was diagnosed with breast cancer at 56 and cervical cancer at 3. Patient is unsure if this niece has had genetic testing.  Joanne Hansen mother was diagnosed with colon cancer at 77 and died at 43. Patient has 2 maternal uncles, 3 maternal aunts, no cancers she is aware of. One of her maternal cousins died from brain cancer in her 47s. No other cancers in maternal cousins. Patient's maternal grandmother died at 31 no cancers, maternal grandfather died in his 62s.  Joanne Hansen father died at 65, no cancers. She has limited information about this side of the family. She knows of one paternal aunt who is deceased. She is unaware of paternal cousins. She does not have information about paternal grandparents.  Joanne Hansen is unaware of previous family history of genetic testing for hereditary cancer risks. Patient's maternal ancestors are of [place] descent, and paternal ancestors are of unknown descent. There is no reported Ashkenazi Jewish ancestry. There is no known consanguinity.  GENETIC COUNSELING ASSESSMENT: Joanne Hansen is a 73 y.o. female with a personal and family history which is somewhat suggestive of a hereditary cancer syndorme and predisposition to cancer. We, therefore, discussed and recommended the following at today's visit.   DISCUSSION: We discussed that 5 - 10% of breast cancer is hereditary, with most cases associated with BRCA1/BRCA2  mutations.  There are other genes that can be associated with hereditary breast cancer syndromes.   We discussed that testing is beneficial for several reasons including surgical decision-making for breast cancer, knowing how to follow individuals after completing their treatment, and understand if other family members could be at risk for cancer and allow them to undergo genetic testing.   We reviewed the characteristics, features and inheritance patterns of hereditary cancer syndromes. We also discussed genetic testing, including the appropriate family members to test, the process  of testing, insurance coverage and turn-around-time for results. We discussed the implications of a negative, positive and/or variant of uncertain significant result. We discussed the timing of testing and whether or not she would like these results before her surgery. She feels strongly she would not change her mind about her surgery even if results are positive. However, she still would like test results back quickly. We recommended Joanne Hansen pursue genetic testing for the STAT Breast Cancer Panel + Common Hereditary Cancers gene panel.   The STAT Breast cancer panel offered by [facility] includes sequencing and rearrangement analysis for the following 9 genes:  ATM, BRCA1, BRCA2, CDH1, CHEK2, PALB2, PTEN, STK11 and TP53.    The Common Hereditary Cancers Panel offered by [facility] includes sequencing and/or deletion duplication testing of the following 48 genes: APC, ATM, AXIN2, BARD1, BMPR1A, BRCA1, BRCA2, BRIP1, CDH1, CDKN2A (p14ARF), CDKN2A (p16INK4a), CKD4, CHEK2, CTNNA1, DICER1, EPCAM (Deletion/duplication testing only), GREM1 (promoter region deletion/duplication testing only), KIT, MEN1, MLH1, MSH2, MSH3, MSH6, MUTYH, NBN, NF1, NHTL1, PALB2, PDGFRA, PMS2, POLD1, POLE, PTEN, RAD50, RAD51C, RAD51D, RNF43, SDHB, SDHC, SDHD, SMAD4, SMARCA4. STK11, TP53, TSC1, TSC2, and VHL.  The following genes were evaluated for sequence changes  only: SDHA and HOXB13 c.251G>A variant only.  Based on Joanne Hansen personal and family history of cancer, she meets medical criteria for genetic testing. Despite that she meets criteria, she may still have an out of pocket cost.   PLAN: After considering the risks, benefits, and limitations, Joanne Hansen provided informed consent to pursue genetic testing. She will have her blood drawn at her next appointment, 9/14, and the blood sample will be  sent to [facility] for analysis of the STAT Breast Cancer Panel + Common Hereditary Cancers Panel. Results should be available within approximately 5-12 days' time, at which point they will be disclosed by telephone to Joanne Hansen, as will any additional recommendations warranted by these results. Joanne Hansen will receive a summary of her genetic counseling visit and a copy of her results once available. This information will also be available in Epic.   Based on Joanne Hansen family history, we recommended her niece have genetic counseling and testing. Joanne Hansen will let us know if we can be of any assistance in coordinating genetic counseling and/or testing for this family member.   Lastly, we encouraged Joanne Hansen. Spacek to remain in contact with cancer genetics annually so that we can continuously update the family history and inform her of any changes in cancer genetics and testing that may be of benefit for this family.   Joanne Hansen. Sharrar questions were answered to her satisfaction today. Our contact information was provided should additional questions or concerns arise. Thank you for the referral and allowing [place] to share in the care of your patient.   Faith Rogue, Joanne Hansen, [facility] Genetic Counselor [email or web address] Phone: [phone]  The patient was seen for a total of 40 minutes in virtual genetic counseling.  Drs. Magrinat, Lindi Adie and/or Burr Medico were available for discussion regarding this case.    _______________________________________________________________________ For Office Staff:  Number of people involved in session: 1 Was an Intern/ student involved with case: no

## 2019-05-04 ENCOUNTER — Other Ambulatory Visit: Payer: Self-pay | Admitting: *Deleted

## 2019-05-04 DIAGNOSIS — Z171 Estrogen receptor negative status [ER-]: Secondary | ICD-10-CM

## 2019-05-04 DIAGNOSIS — C50211 Malignant neoplasm of upper-inner quadrant of right female breast: Secondary | ICD-10-CM

## 2019-05-07 ENCOUNTER — Other Ambulatory Visit: Payer: Self-pay

## 2019-05-07 ENCOUNTER — Inpatient Hospital Stay (HOSPITAL_BASED_OUTPATIENT_CLINIC_OR_DEPARTMENT_OTHER): Payer: Medicare Other | Admitting: Oncology

## 2019-05-07 ENCOUNTER — Inpatient Hospital Stay: Payer: Medicare Other

## 2019-05-07 VITALS — BP 149/79 | HR 71 | Temp 98.7°F | Resp 20 | Ht 62.0 in | Wt 128.9 lb

## 2019-05-07 DIAGNOSIS — C50211 Malignant neoplasm of upper-inner quadrant of right female breast: Secondary | ICD-10-CM

## 2019-05-07 DIAGNOSIS — Z803 Family history of malignant neoplasm of breast: Secondary | ICD-10-CM

## 2019-05-07 DIAGNOSIS — Z171 Estrogen receptor negative status [ER-]: Secondary | ICD-10-CM

## 2019-05-07 DIAGNOSIS — R159 Full incontinence of feces: Secondary | ICD-10-CM

## 2019-05-07 DIAGNOSIS — I4891 Unspecified atrial fibrillation: Secondary | ICD-10-CM

## 2019-05-07 LAB — CBC WITH DIFFERENTIAL (CANCER CENTER ONLY)
Abs Immature Granulocytes: 0.01 10*3/uL (ref 0.00–0.07)
Basophils Absolute: 0 10*3/uL (ref 0.0–0.1)
Basophils Relative: 1 %
Eosinophils Absolute: 0.1 10*3/uL (ref 0.0–0.5)
Eosinophils Relative: 2 %
HCT: 39.8 % (ref 36.0–46.0)
Hemoglobin: 13.1 g/dL (ref 12.0–15.0)
Immature Granulocytes: 0 %
Lymphocytes Relative: 26 %
Lymphs Abs: 1.7 10*3/uL (ref 0.7–4.0)
MCH: 32.8 pg (ref 26.0–34.0)
MCHC: 32.9 g/dL (ref 30.0–36.0)
MCV: 99.5 fL (ref 80.0–100.0)
Monocytes Absolute: 0.6 10*3/uL (ref 0.1–1.0)
Monocytes Relative: 9 %
Neutro Abs: 4.2 10*3/uL (ref 1.7–7.7)
Neutrophils Relative %: 62 %
Platelet Count: 240 10*3/uL (ref 150–400)
RBC: 4 MIL/uL (ref 3.87–5.11)
RDW: 12.4 % (ref 11.5–15.5)
WBC Count: 6.7 10*3/uL (ref 4.0–10.5)
nRBC: 0 % (ref 0.0–0.2)

## 2019-05-07 LAB — CMP (CANCER CENTER ONLY)
ALT: 13 U/L (ref 0–44)
AST: 19 U/L (ref 15–41)
Albumin: 4.2 g/dL (ref 3.5–5.0)
Alkaline Phosphatase: 71 U/L (ref 38–126)
Anion gap: 7 (ref 5–15)
BUN: 20 mg/dL (ref 8–23)
CO2: 28 mmol/L (ref 22–32)
Calcium: 9.1 mg/dL (ref 8.9–10.3)
Chloride: 105 mmol/L (ref 98–111)
Creatinine: 0.77 mg/dL (ref 0.44–1.00)
GFR, Est AFR Am: >60 mL/min (ref >60)
GFR, Estimated: >60 mL/min (ref >60)
Glucose, Bld: 92 mg/dL (ref 70–99)
Potassium: 4.1 mmol/L (ref 3.5–5.1)
Sodium: 140 mmol/L (ref 135–145)
Total Bilirubin: 0.5 mg/dL (ref 0.3–1.2)
Total Protein: 6.8 g/dL (ref 6.5–8.1)

## 2019-05-07 MED ORDER — LORATADINE 10 MG PO TABS: 10.0000 mg | ORAL_TABLET | Freq: Every day | ORAL | 0 refills | Status: DC

## 2019-05-07 MED ORDER — PROCHLORPERAZINE MALEATE 10 MG PO TABS: 10.0000 mg | ORAL_TABLET | Freq: Four times a day (QID) | ORAL | 1 refills | Status: DC | PRN

## 2019-05-07 MED ORDER — LIDOCAINE-PRILOCAINE 2.5-2.5 % EX CREA: TOPICAL_CREAM | CUTANEOUS | 3 refills | Status: DC

## 2019-05-07 MED ORDER — DEXAMETHASONE 4 MG PO TABS: 8.0000 mg | ORAL_TABLET | Freq: Two times a day (BID) | ORAL | 1 refills | Status: DC

## 2019-05-07 MED ORDER — LORAZEPAM 0.5 MG PO TABS: 0.5000 mg | ORAL_TABLET | Freq: Every evening | ORAL | 0 refills | Status: DC | PRN

## 2019-05-07 NOTE — Progress Notes (Signed)
START ON PATHWAY REGIMEN - Breast     A cycle is every 21 days:     Docetaxel      Cyclophosphamide   **Always confirm dose/schedule in your pharmacy ordering system**  Patient Characteristics: Postoperative without Neoadjuvant Therapy (Pathologic Staging), Invasive Disease, Adjuvant Therapy, HER2 Negative/Unknown/Equivocal, ER Negative/Unknown, Node Negative, pT1a-c, N2m or pT1c or Higher, pN0 Therapeutic Status: Postoperative without Neoadjuvant Therapy (Pathologic Staging) AJCC Grade: G2 AJCC N Category: pN0 AJCC M Category: cM0 ER Status: Negative (-) AJCC 8 Stage Grouping: IB HER2 Status: Negative (-) Oncotype Dx Recurrence Score: Not Appropriate AJCC T Category: pT1c PR Status: Negative (-) Intent of Therapy: Curative Intent, Discussed with Patient

## 2019-05-08 ENCOUNTER — Telehealth: Payer: Self-pay | Admitting: *Deleted

## 2019-05-08 ENCOUNTER — Other Ambulatory Visit: Payer: Self-pay

## 2019-05-08 ENCOUNTER — Encounter (HOSPITAL_BASED_OUTPATIENT_CLINIC_OR_DEPARTMENT_OTHER): Payer: Self-pay | Admitting: *Deleted

## 2019-05-08 ENCOUNTER — Telehealth: Payer: Self-pay | Admitting: Oncology

## 2019-05-08 NOTE — Telephone Encounter (Signed)
I talk with patient regarding schedule  

## 2019-05-08 NOTE — Telephone Encounter (Signed)
Spoke to pt concerning dx and treatment care plan. Discussed chemo education class as well as anti-nausea medications and neulasta injection. Confirmed future appts. Contact information provided for questions or needs. Denies further question or concerns at this time.

## 2019-05-08 NOTE — Progress Notes (Signed)
PT has a hx of PAF and pre-syncope. Obtained records from her previous cardiologist Dr Fara Olden at [facility] in [facility]. Scanned into EMR as they are not on Epic. Reviewed with Dr. Daiva Huge. Pt will come in for EKG prior to dos, otherwise no further work up needed. Pt will need to establish care with a Cardiologist locally. Pt states that she plans to do so.

## 2019-05-08 NOTE — Telephone Encounter (Signed)
This RN received VM from pt left at 1524 stating she was notified of multiple medications that have been called to her pharmacy " and I am wondering do I need all of them "  Return call number given as [phone].  Per chart review - noted pt has been contacted by navigator per above concern at 1544.

## 2019-05-09 ENCOUNTER — Ambulatory Visit: Payer: Self-pay | Admitting: Surgery

## 2019-05-09 ENCOUNTER — Telehealth: Payer: Self-pay | Admitting: *Deleted

## 2019-05-09 ENCOUNTER — Encounter (HOSPITAL_BASED_OUTPATIENT_CLINIC_OR_DEPARTMENT_OTHER)
Admission: RE | Admit: 2019-05-09 | Discharge: 2019-05-09 | Disposition: A | Discharge status: Home / Self Care | Payer: Medicare Other | Source: Ambulatory Visit | Attending: Surgery | Admitting: Surgery

## 2019-05-09 DIAGNOSIS — Z01812 Encounter for preprocedural laboratory examination: Secondary | ICD-10-CM | POA: Insufficient documentation

## 2019-05-09 NOTE — Progress Notes (Signed)
      Enhanced Recovery after Surgery  Enhanced Recovery after Surgery is a protocol used to improve the stress on your body and your recovery after surgery.  Patient Instructions  . The night before surgery:  o No food after midnight. ONLY clear liquids after midnight  . The day of surgery (if you do NOT have diabetes):  o Drink ONE (1) Pre-Surgery Clear Ensure as directed.   o This drink was given to you during your hospital  pre-op appointment visit. o The pre-op nurse will instruct you on the time to drink the  Pre-Surgery Ensure depending on your surgery time. o Finish the drink at the designated time by the pre-op nurse.  o Nothing else to drink after completing the  Pre-Surgery Clear Ensure.  . The day of surgery (if you have diabetes): o Drink ONE (1) Gatorade 2 (G2) as directed. o This drink was given to you during your hospital  pre-op appointment visit.  o The pre-op nurse will instruct you on the time to drink the   Gatorade 2 (G2) depending on your surgery time. o Color of the Gatorade may vary. Red is not allowed. o Nothing else to drink after completing the  Gatorade 2 (G2).         If you have questions, please contact your surgeon's office.  Surgical soap also given to patient with instructions for use.  Patient verbalized understanding of instructions. 

## 2019-05-09 NOTE — Telephone Encounter (Signed)
Called pt and informed that port has been added to her sx. Also discussed dignicap and email information to verified email. Denies further needs or concerns at this time.

## 2019-05-12 ENCOUNTER — Other Ambulatory Visit (HOSPITAL_COMMUNITY)
Admission: RE | Admit: 2019-05-12 | Discharge: 2019-05-12 | Disposition: A | Discharge status: Home / Self Care | Payer: Medicare Other | Source: Ambulatory Visit | Attending: Surgery | Admitting: Surgery

## 2019-05-12 DIAGNOSIS — Z01812 Encounter for preprocedural laboratory examination: Secondary | ICD-10-CM | POA: Diagnosis present

## 2019-05-12 DIAGNOSIS — Z20828 Contact with and (suspected) exposure to other viral communicable diseases: Secondary | ICD-10-CM | POA: Insufficient documentation

## 2019-05-13 LAB — NOVEL CORONAVIRUS, NAA (HOSP ORDER, SEND-OUT TO REF LAB; TAT 18-24 HRS): SARS-CoV-2, NAA: NOT DETECTED

## 2019-05-14 ENCOUNTER — Ambulatory Visit
Admission: RE | Admit: 2019-05-14 | Discharge: 2019-05-14 | Disposition: A | Discharge status: Home / Self Care | Payer: Medicare Other | Source: Ambulatory Visit | Attending: Surgery | Admitting: Surgery

## 2019-05-14 ENCOUNTER — Other Ambulatory Visit (HOSPITAL_COMMUNITY): Payer: Medicare Other

## 2019-05-14 ENCOUNTER — Other Ambulatory Visit: Payer: Self-pay

## 2019-05-14 DIAGNOSIS — C50911 Malignant neoplasm of unspecified site of right female breast: Secondary | ICD-10-CM

## 2019-05-15 ENCOUNTER — Ambulatory Visit (HOSPITAL_BASED_OUTPATIENT_CLINIC_OR_DEPARTMENT_OTHER): Payer: Medicare Other | Admitting: Anesthesiology

## 2019-05-15 ENCOUNTER — Ambulatory Visit (HOSPITAL_COMMUNITY)
Admission: RE | Admit: 2019-05-15 | Discharge: 2019-05-15 | Disposition: A | Discharge status: Home / Self Care | Payer: Medicare Other | Source: Ambulatory Visit | Attending: Surgery | Admitting: Surgery

## 2019-05-15 ENCOUNTER — Ambulatory Visit (HOSPITAL_COMMUNITY): Payer: Medicare Other

## 2019-05-15 ENCOUNTER — Ambulatory Visit (HOSPITAL_BASED_OUTPATIENT_CLINIC_OR_DEPARTMENT_OTHER)
Admission: RE | Admit: 2019-05-15 | Discharge: 2019-05-15 | Disposition: A | Discharge status: Home / Self Care | Payer: Medicare Other | Attending: Surgery | Admitting: Surgery

## 2019-05-15 ENCOUNTER — Ambulatory Visit
Admission: RE | Admit: 2019-05-15 | Discharge: 2019-05-15 | Disposition: A | Discharge status: Home / Self Care | Payer: Medicare Other | Source: Ambulatory Visit | Attending: Surgery | Admitting: Surgery

## 2019-05-15 ENCOUNTER — Encounter (HOSPITAL_BASED_OUTPATIENT_CLINIC_OR_DEPARTMENT_OTHER): Payer: Self-pay

## 2019-05-15 ENCOUNTER — Encounter (HOSPITAL_BASED_OUTPATIENT_CLINIC_OR_DEPARTMENT_OTHER): Payer: Self-pay | Admitting: *Deleted

## 2019-05-15 ENCOUNTER — Encounter (HOSPITAL_BASED_OUTPATIENT_CLINIC_OR_DEPARTMENT_OTHER)
Admission: RE | Disposition: A | Discharge status: Home / Self Care | Payer: Self-pay | Source: Home / Self Care | Attending: Surgery

## 2019-05-15 ENCOUNTER — Other Ambulatory Visit: Payer: Self-pay

## 2019-05-15 DIAGNOSIS — C50211 Malignant neoplasm of upper-inner quadrant of right female breast: Secondary | ICD-10-CM | POA: Diagnosis not present

## 2019-05-15 DIAGNOSIS — Z803 Family history of malignant neoplasm of breast: Secondary | ICD-10-CM | POA: Diagnosis not present

## 2019-05-15 DIAGNOSIS — E785 Hyperlipidemia, unspecified: Secondary | ICD-10-CM | POA: Diagnosis not present

## 2019-05-15 DIAGNOSIS — Z95828 Presence of other vascular implants and grafts: Secondary | ICD-10-CM

## 2019-05-15 DIAGNOSIS — Z880 Allergy status to penicillin: Secondary | ICD-10-CM | POA: Insufficient documentation

## 2019-05-15 DIAGNOSIS — Z888 Allergy status to other drugs, medicaments and biological substances status: Secondary | ICD-10-CM | POA: Diagnosis not present

## 2019-05-15 DIAGNOSIS — Z171 Estrogen receptor negative status [ER-]: Secondary | ICD-10-CM | POA: Diagnosis not present

## 2019-05-15 DIAGNOSIS — M1990 Unspecified osteoarthritis, unspecified site: Secondary | ICD-10-CM | POA: Diagnosis not present

## 2019-05-15 DIAGNOSIS — C50911 Malignant neoplasm of unspecified site of right female breast: Secondary | ICD-10-CM

## 2019-05-15 HISTORY — PX: PORTACATH PLACEMENT: SHX2246

## 2019-05-15 HISTORY — PX: BREAST LUMPECTOMY WITH RADIOACTIVE SEED AND SENTINEL LYMPH NODE BIOPSY: SHX6550

## 2019-05-15 SURGERY — BREAST LUMPECTOMY WITH RADIOACTIVE SEED AND SENTINEL LYMPH NODE BIOPSY
Anesthesia: General | Site: Chest | Laterality: Right

## 2019-05-15 MED ORDER — PROPOFOL 10 MG/ML IV BOLUS
INTRAVENOUS | Status: AC
  Filled 2019-05-15: qty 20

## 2019-05-15 MED ORDER — CLINDAMYCIN PHOSPHATE 900 MG/50ML IV SOLN: 900.0000 mg | INTRAVENOUS | Status: AC

## 2019-05-15 MED ORDER — GABAPENTIN 300 MG PO CAPS
300.0000 mg | ORAL_CAPSULE | ORAL | Status: AC
  Administered 2019-05-15: 10:00:00 300 mg via ORAL

## 2019-05-15 MED ORDER — ONDANSETRON HCL 4 MG/2ML IJ SOLN
INTRAMUSCULAR | Status: AC
  Filled 2019-05-15: qty 2

## 2019-05-15 MED ORDER — MIDAZOLAM HCL 2 MG/2ML IJ SOLN
INTRAMUSCULAR | Status: AC
  Filled 2019-05-15: qty 2

## 2019-05-15 MED ORDER — FENTANYL CITRATE (PF) 100 MCG/2ML IJ SOLN
INTRAMUSCULAR | Status: AC
  Filled 2019-05-15: qty 2

## 2019-05-15 MED ORDER — HEPARIN (PORCINE) IN NACL 2-0.9 UNITS/ML
INTRAMUSCULAR | Status: AC | PRN
  Administered 2019-05-15: 1

## 2019-05-15 MED ORDER — HEPARIN SOD (PORK) LOCK FLUSH 100 UNIT/ML IV SOLN
INTRAVENOUS | Status: AC
  Filled 2019-05-15: qty 5

## 2019-05-15 MED ORDER — METHYLENE BLUE 0.5 % INJ SOLN
INTRAVENOUS | Status: AC
  Filled 2019-05-15: qty 10

## 2019-05-15 MED ORDER — HEPARIN SOD (PORK) LOCK FLUSH 100 UNIT/ML IV SOLN
INTRAVENOUS | Status: DC | PRN
  Administered 2019-05-15: 400 [IU] via INTRAVENOUS

## 2019-05-15 MED ORDER — DEXAMETHASONE SODIUM PHOSPHATE 4 MG/ML IJ SOLN
INTRAMUSCULAR | Status: DC | PRN
  Administered 2019-05-15: 4 mg via INTRAVENOUS

## 2019-05-15 MED ORDER — OXYCODONE HCL 5 MG PO TABS: 5.0000 mg | ORAL_TABLET | Freq: Once | ORAL | Status: DC | PRN

## 2019-05-15 MED ORDER — GABAPENTIN 300 MG PO CAPS
ORAL_CAPSULE | ORAL | Status: AC
  Filled 2019-05-15: qty 1

## 2019-05-15 MED ORDER — CHLORHEXIDINE GLUCONATE CLOTH 2 % EX PADS: 6.0000 | MEDICATED_PAD | Freq: Once | CUTANEOUS | Status: DC

## 2019-05-15 MED ORDER — LIDOCAINE 2% (20 MG/ML) 5 ML SYRINGE
INTRAMUSCULAR | Status: AC
  Filled 2019-05-15: qty 5

## 2019-05-15 MED ORDER — GABAPENTIN 300 MG PO CAPS: 300.0000 mg | ORAL_CAPSULE | ORAL | Status: AC

## 2019-05-15 MED ORDER — TECHNETIUM TC 99M SULFUR COLLOID FILTERED
1.0000 | Freq: Once | INTRAVENOUS | Status: AC | PRN
  Administered 2019-05-15: 1 via INTRADERMAL

## 2019-05-15 MED ORDER — ACETAMINOPHEN 500 MG PO TABS: 1000.0000 mg | ORAL_TABLET | ORAL | Status: AC

## 2019-05-15 MED ORDER — OXYCODONE HCL 5 MG/5ML PO SOLN: 5.0000 mg | Freq: Once | ORAL | Status: DC | PRN

## 2019-05-15 MED ORDER — BUPIVACAINE-EPINEPHRINE 0.25% -1:200000 IJ SOLN
INTRAMUSCULAR | Status: AC
  Filled 2019-05-15: qty 1

## 2019-05-15 MED ORDER — ACETAMINOPHEN 500 MG PO TABS
1000.0000 mg | ORAL_TABLET | ORAL | Status: AC
  Administered 2019-05-15: 10:00:00 1000 mg via ORAL

## 2019-05-15 MED ORDER — LIDOCAINE HCL (CARDIAC) PF 100 MG/5ML IV SOSY
PREFILLED_SYRINGE | INTRAVENOUS | Status: DC | PRN
  Administered 2019-05-15: 80 mg via INTRAVENOUS

## 2019-05-15 MED ORDER — FENTANYL CITRATE (PF) 100 MCG/2ML IJ SOLN: 25.0000 ug | INTRAMUSCULAR | Status: DC | PRN

## 2019-05-15 MED ORDER — BUPIVACAINE-EPINEPHRINE (PF) 0.5% -1:200000 IJ SOLN
INTRAMUSCULAR | Status: DC | PRN
  Administered 2019-05-15: 30 mL via PERINEURAL

## 2019-05-15 MED ORDER — MIDAZOLAM HCL 2 MG/2ML IJ SOLN
1.0000 mg | INTRAMUSCULAR | Status: DC | PRN
  Administered 2019-05-15: 11:00:00 1 mg via INTRAVENOUS

## 2019-05-15 MED ORDER — CLINDAMYCIN PHOSPHATE 900 MG/50ML IV SOLN
900.0000 mg | INTRAVENOUS | Status: AC
  Administered 2019-05-15: 12:00:00 900 mg via INTRAVENOUS

## 2019-05-15 MED ORDER — LACTATED RINGERS IV SOLN
INTRAVENOUS | Status: DC
  Administered 2019-05-15 (×2): via INTRAVENOUS

## 2019-05-15 MED ORDER — ONDANSETRON HCL 4 MG/2ML IJ SOLN
INTRAMUSCULAR | Status: DC | PRN
  Administered 2019-05-15: 4 mg via INTRAVENOUS

## 2019-05-15 MED ORDER — EPHEDRINE SULFATE 50 MG/ML IJ SOLN
INTRAMUSCULAR | Status: DC | PRN
  Administered 2019-05-15 (×4): 10 mg via INTRAVENOUS

## 2019-05-15 MED ORDER — FENTANYL CITRATE (PF) 100 MCG/2ML IJ SOLN
50.0000 ug | INTRAMUSCULAR | Status: AC | PRN
  Administered 2019-05-15 (×3): 50 ug via INTRAVENOUS

## 2019-05-15 MED ORDER — OXYCODONE HCL 5 MG PO TABS: 5.0000 mg | ORAL_TABLET | Freq: Four times a day (QID) | ORAL | 0 refills | Status: DC | PRN

## 2019-05-15 MED ORDER — ACETAMINOPHEN 500 MG PO TABS
ORAL_TABLET | ORAL | Status: AC
  Filled 2019-05-15: qty 2

## 2019-05-15 MED ORDER — CLINDAMYCIN PHOSPHATE 900 MG/50ML IV SOLN
INTRAVENOUS | Status: AC
  Filled 2019-05-15: qty 50

## 2019-05-15 MED ORDER — PROPOFOL 10 MG/ML IV BOLUS
INTRAVENOUS | Status: DC | PRN
  Administered 2019-05-15: 120 mg via INTRAVENOUS

## 2019-05-15 MED ORDER — BUPIVACAINE-EPINEPHRINE (PF) 0.25% -1:200000 IJ SOLN
INTRAMUSCULAR | Status: DC | PRN
  Administered 2019-05-15: 21 mL

## 2019-05-15 MED ORDER — ONDANSETRON HCL 4 MG/2ML IJ SOLN: 4.0000 mg | Freq: Once | INTRAMUSCULAR | Status: DC | PRN

## 2019-05-15 MED ORDER — SODIUM CHLORIDE (PF) 0.9 % IJ SOLN
INTRAMUSCULAR | Status: AC
  Filled 2019-05-15: qty 10

## 2019-05-15 SURGICAL SUPPLY — 56 items
APPLIER CLIP 9.375 MED OPEN (MISCELLANEOUS) ×4
BAG DECANTER FOR FLEXI CONT (MISCELLANEOUS) ×4 IMPLANT
BINDER BREAST LRG (GAUZE/BANDAGES/DRESSINGS) ×4 IMPLANT
BLADE HEX COATED 2.75 (ELECTRODE) ×4 IMPLANT
BLADE SURG 11 STRL SS (BLADE) ×4 IMPLANT
BLADE SURG 15 STRL LF DISP TIS (BLADE) ×2 IMPLANT
BLADE SURG 15 STRL SS (BLADE) ×2
CANISTER SUC SOCK COL 7IN (MISCELLANEOUS) IMPLANT
CANISTER SUCT 1200ML W/VALVE (MISCELLANEOUS) ×4 IMPLANT
CHLORAPREP W/TINT 26 (MISCELLANEOUS) ×8 IMPLANT
CLIP APPLIE 9.375 MED OPEN (MISCELLANEOUS) ×2 IMPLANT
COVER BACK TABLE REUSABLE LG (DRAPES) ×4 IMPLANT
COVER MAYO STAND REUSABLE (DRAPES) ×4 IMPLANT
COVER PROBE 5X48 (MISCELLANEOUS) ×2
COVER PROBE W GEL 5X96 (DRAPES) ×4 IMPLANT
DECANTER SPIKE VIAL GLASS SM (MISCELLANEOUS) ×4 IMPLANT
DERMABOND ADVANCED (GAUZE/BANDAGES/DRESSINGS) ×4
DERMABOND ADVANCED .7 DNX12 (GAUZE/BANDAGES/DRESSINGS) ×4 IMPLANT
DRAPE C-ARM 42X72 X-RAY (DRAPES) ×4 IMPLANT
DRAPE LAPAROSCOPIC ABDOMINAL (DRAPES) ×4 IMPLANT
DRAPE UTILITY XL STRL (DRAPES) ×4 IMPLANT
ELECT COATED BLADE 2.86 ST (ELECTRODE) ×4 IMPLANT
ELECT REM PT RETURN 9FT ADLT (ELECTROSURGICAL) ×4
ELECTRODE REM PT RTRN 9FT ADLT (ELECTROSURGICAL) ×2 IMPLANT
GLOVE BIO SURGEON STRL SZ7 (GLOVE) ×8 IMPLANT
GLOVE BIOGEL PI IND STRL 7.0 (GLOVE) ×4 IMPLANT
GLOVE BIOGEL PI IND STRL 7.5 (GLOVE) ×4 IMPLANT
GLOVE BIOGEL PI IND STRL 8 (GLOVE) ×4 IMPLANT
GLOVE BIOGEL PI INDICATOR 7.0 (GLOVE) ×4
GLOVE BIOGEL PI INDICATOR 7.5 (GLOVE) ×4
GLOVE BIOGEL PI INDICATOR 8 (GLOVE) ×4
GLOVE ECLIPSE 8.0 STRL XLNG CF (GLOVE) ×8 IMPLANT
GOWN STRL REUS W/ TWL LRG LVL3 (GOWN DISPOSABLE) ×8 IMPLANT
GOWN STRL REUS W/TWL LRG LVL3 (GOWN DISPOSABLE) ×8
HEMOSTAT ARISTA ABSORB 3G PWDR (HEMOSTASIS) IMPLANT
HEMOSTAT SNOW SURGICEL 2X4 (HEMOSTASIS) IMPLANT
KIT CVR 48X5XPRB PLUP LF (MISCELLANEOUS) ×2 IMPLANT
KIT MARKER MARGIN INK (KITS) ×4 IMPLANT
KIT PORT POWER 8FR ISP CVUE (Port) ×4 IMPLANT
NEEDLE HYPO 25X1 1.5 SAFETY (NEEDLE) ×4 IMPLANT
NS IRRIG 1000ML POUR BTL (IV SOLUTION) ×4 IMPLANT
PACK BASIN DAY SURGERY FS (CUSTOM PROCEDURE TRAY) ×4 IMPLANT
PENCIL BUTTON HOLSTER BLD 10FT (ELECTRODE) ×4 IMPLANT
SLEEVE SCD COMPRESS KNEE MED (MISCELLANEOUS) ×4 IMPLANT
SPONGE LAP 4X18 RFD (DISPOSABLE) ×4 IMPLANT
SUT MNCRL AB 4-0 PS2 18 (SUTURE) ×4 IMPLANT
SUT MON AB 4-0 PC3 18 (SUTURE) ×4 IMPLANT
SUT PROLENE 2 0 SH DA (SUTURE) ×4 IMPLANT
SUT VICRYL 3-0 CR8 SH (SUTURE) ×4 IMPLANT
SYR 5ML LUER SLIP (SYRINGE) ×4 IMPLANT
SYR CONTROL 10ML LL (SYRINGE) ×4 IMPLANT
TOWEL GREEN STERILE FF (TOWEL DISPOSABLE) ×8 IMPLANT
TRAY FAXITRON CT DISP (TRAY / TRAY PROCEDURE) ×4 IMPLANT
TUBE CONNECTING 20'X1/4 (TUBING) ×1
TUBE CONNECTING 20X1/4 (TUBING) ×3 IMPLANT
YANKAUER SUCT BULB TIP NO VENT (SUCTIONS) ×4 IMPLANT

## 2019-05-15 NOTE — Interval H&P Note (Signed)
History and Physical Interval Note:  05/15/2019 11:15 AM  Joanne Hansen  has presented today for surgery, with the diagnosis of right breast cancer.  The various methods of treatment have been discussed with the patient and family. After consideration of risks, benefits and other options for treatment, the patient has consented to  Procedure(s): RIGHT BREAST LUMPECTOMY WITH RADIOACTIVE SEED AND SENTINEL LYMPH NODE MAPPING (Right) INSERTION PORT-A-CATH WITH ULTRASOUND (N/A) as a surgical intervention.  The patient's history has been reviewed, patient examined, no change in status, stable for surgery.  I have reviewed the patient's chart and labs.  Questions were answered to the patient's satisfaction.     [place]

## 2019-05-15 NOTE — Progress Notes (Signed)
Assisted Dr. Foster with right, ultrasound guided, pectoralis block. Side rails up, monitors on throughout procedure. See vital signs in flow sheet. Tolerated Procedure well. 

## 2019-05-15 NOTE — Discharge Instructions (Signed)
[place] Office Phone Number [phone]  BREAST BIOPSY/ LUMPECTOMY: POST OP INSTRUCTIONS  Always review your discharge instruction sheet given to you by the facility where your surgery was performed.  IF YOU HAVE DISABILITY OR FAMILY LEAVE FORMS, YOU MUST BRING THEM TO THE OFFICE FOR PROCESSING.  DO NOT GIVE THEM TO YOUR DOCTOR.  1. A prescription for pain medication may be given to you upon discharge.  Take your pain medication as prescribed, if needed.  If narcotic pain medicine is not needed, then you may take acetaminophen (Tylenol) or ibuprofen (Advil) as needed.  2. Take your usually prescribed medications unless otherwise directed 3. If you need a refill on your pain medication, please contact your pharmacy.  They will contact our office to request authorization.  Prescriptions will not be filled after 5pm or on week-ends. 4. You should eat very light the first 24 hours after surgery, such as soup, crackers, pudding, etc.  Resume your normal diet the day after surgery. 5. Most patients will experience some swelling and bruising in the breast.  Ice packs and a good support bra will help.  Swelling and bruising can take several days to resolve.  6. It is common to experience some constipation if taking pain medication after surgery.  Increasing fluid intake and taking a stool softener will usually help or prevent this problem from occurring.  A mild laxative (Milk of Magnesia or Miralax) should be taken according to package directions if there are no bowel movements after 48 hours. 7. Unless discharge instructions indicate otherwise, you may remove your bandages 24-48 hours after surgery, and you may shower at that time.  You may have steri-strips (small skin tapes) in place directly over the incision.  These strips should be left on the skin for 7-10 days.  If your surgeon used skin glue on the incision, you may shower in 24 hours.  The glue will flake off over the next  2-3 weeks.  Any sutures or staples will be removed at the office during your follow-up visit. 8. ACTIVITIES:  You may resume regular daily activities (gradually increasing) beginning the next day.  Wearing a good support bra or sports bra minimizes pain and swelling.  You may have sexual intercourse when it is comfortable. a. You may drive when you no longer are taking prescription pain medication, you can comfortably wear a seatbelt, and you can safely maneuver your car and apply brakes. b. RETURN TO WORK:  ______________________________________________________________________________________ 9. You should see your doctor in the office for a follow-up appointment approximately two weeks after your surgery.  Your doctors nurse will typically make your follow-up appointment when she calls you with your pathology report.  Expect your pathology report 2-3 business days after your surgery.  You may call to check if you do not hear from [place] after three days. 10. OTHER INSTRUCTIONS: _______________________________________________________________________________________________ _____________________________________________________________________________________________________________________________________ _____________________________________________________________________________________________________________________________________ _____________________________________________________________________________________________________________________________________  WHEN TO CALL YOUR DOCTOR: 1. Fever over 101.0 2. Nausea and/or vomiting. 3. Extreme swelling or bruising. 4. Continued bleeding from incision. 5. Increased pain, redness, or drainage from the incision.  The clinic staff is available to answer your questions during regular business hours.  Please dont hesitate to call and ask to speak to one of the nurses for clinical concerns.  If you have a medical emergency, go to the nearest emergency  room or call 911.  A surgeon from [facility] is always on call at the hospital.  For further questions, please visit [email or web address]  No tylenol until after 4:30 today.   Post Anesthesia Home Care Instructions  Activity: Get plenty of rest for the remainder of the day. A responsible individual must stay with you for 24 hours following the procedure.  For the next 24 hours, DO NOT: -Drive a car -Paediatric nurse -Drink alcoholic beverages -Take any medication unless instructed by your physician -Make any legal decisions or sign important papers.  Meals: Start with liquid foods such as gelatin or soup. Progress to regular foods as tolerated. Avoid greasy, spicy, heavy foods. If nausea and/or vomiting occur, drink only clear liquids until the nausea and/or vomiting subsides. Call your physician if vomiting continues.  Special Instructions/Symptoms: Your throat may feel dry or sore from the anesthesia or the breathing tube placed in your throat during surgery. If this causes discomfort, gargle with warm salt water. The discomfort should disappear within 24 hours.  If you had a scopolamine patch placed behind your ear for the management of post- operative nausea and/or vomiting:  1. The medication in the patch is effective for 72 hours, after which it should be removed.  Wrap patch in a tissue and discard in the trash. Wash hands thoroughly with soap and water. 2. You may remove the patch earlier than 72 hours if you experience unpleasant side effects which may include dry mouth, dizziness or visual disturbances. 3. Avoid touching the patch. Wash your hands with soap and water after contact with the patch.

## 2019-05-15 NOTE — Op Note (Signed)
Preoperative diagnosis: Stage I right breast cancer  Postop diagnosis: Same  Procedure: Right breast seed localized lumpectomy with right axillary sentinel lymph node mapping and placement of right internal jugular 8 [place] Port-A-Cath with ultrasound and C arm guidance  Surgeon: Erroll Luna, MD  Anesthesia: General with right pectoral block and local consisting of 0.25% Sensorcaine with epinephrine  EBL: 20 cc  : Drain none  IV fluids: Per anesthesia record  Indications for procedure: The patient 73 year old female with a triple negative right breast cancer.  She opted for breast conservation and Port-A-Cath was requested by medical oncology for chemotherapy.  Risk of bleeding, infection, collapsed lung, blood clot, stroke, myocardial event, myocardial injury, mediastinal injury, catheter migration, catheter malfunction, and the need to remove the catheter before treatment to server as well as possibly replace the catheter due to issues discussed.  Other risk of surgery include bleeding, infection, death, lymphedema, shoulder pain, breast cosmetic deformity, and the need for more additional surgery.  Description of procedure: The patient was met in the holding area.  She underwent a right pectoral block per anesthesia and injection of the right breast with technetium sulfur colloid.  Neoprobe was used to verify seed location in the right breast.  This was marked as the correct side.  Questions were answered.  She was taken back to the operating room.  She is placed supine upon the operating room table.  After induction of general anesthesia the right breast was prepped and draped in sterile fashion timeout was done.  The Port-A-Cath was placed first.  Using ultrasound guidance to localize the right internal jugular vein in the neck.  A needle was introduced into the right internal jugular vein under direct guidance and a wire was fed through this easily down into the inferior vena cava.  C-arm  was used to verify wire location.  Next, a small pocket was created below the right clavicle.  This was with cautery and scalpel.  I then used a scalpel to enlarge the puncture site with a wire inserted into the skin.  Hemostat was used to create a tunnel and curved tendon passer was used to retrieve the catheter from the lower incision to the upper incision.  This was cut to 20 cm.  It was then attached to the port and secured firmly.  I then with the patient Trendelenburg past the dilator introducer complex of the wire moving the wire to and fro without resistance.  I then removed the dilator and reduce complex and fed the catheter through the peel-away sheath.  This was done the sheath peeled away without difficulty.  C-arm images showed the tip to be in the right atrium a pulled this back about 2 cm because she was having some atypia and reposition the catheter.  This because the atypical atrial pattern to stop and she had no further issue with that.  This showed the tip to be in the distal SVC.  This flushed easily and pulled back dark nonpulsatile blood.  Secured the chest wall with 2-0 Prolene.  Wounds were then closed with 3-0 Vicryl and 4-0 Monocryl after assuring hemostasis.  She was then prepped and draped a second time in a sterile fashion.  A second timeout was done.  Neoprobe was used to identify the seed in the right central breast.  Curvilinear incision was made along the medial border of the nipple areolar complex.  Dissection around all tissue was done with cautery.  We excised the tumor with grossly  negative margins.  The superior, medial, lateral margins were close therefore these were excised the second specimens.  Faxitron revealed the seed clip to be in the specimen.  Hemostasis was achieved.  Of note the deep margin was muscle.  We then irrigated the cavity.  It was hemostatic.  Clips were placed and this was closed with 3-0 Vicryl and 4-0 Monocryl.  The neoprobe was then switched to  technetium settings.  The hotspot identified in the right axilla and a 4 cm incision was made along the inferior border of the right axilla.  Dissection was carried down in 1 hot sentinel nodes identified and removed.  Background counts approached 0.  There is no other adenopathy and hemostasis was achieved with cautery.  This was then closed with 3-0 Vicryl and 4-0 Monocryl.  Dermabond applied.  All final counts found to be correct of sponge, needles and instruments.  The patient was then awoke extubated taken to recovery in satisfactory condition.

## 2019-05-15 NOTE — Anesthesia Postprocedure Evaluation (Signed)
Anesthesia Post Note  Patient: Joanne Hansen  Procedure(s) Performed: RIGHT BREAST LUMPECTOMY WITH RADIOACTIVE SEED AND SENTINEL LYMPH NODE MAPPING (Right Breast) INSERTION PORT-A-CATH WITH ULTRASOUND (Right Chest)     Patient location during evaluation: PACU Anesthesia Type: General Level of consciousness: awake and alert and oriented Pain management: pain level controlled Vital Signs Assessment: post-procedure vital signs reviewed and stable Respiratory status: spontaneous breathing, nonlabored ventilation and respiratory function stable Cardiovascular status: blood pressure returned to baseline and stable Postop Assessment: no apparent nausea or vomiting Anesthetic complications: no    Last Vitals:  Vitals:   05/15/19 1400 05/15/19 1415  BP: 120/79 129/73  Pulse: 76 71  Resp: 12 15  Temp:    SpO2: 98% 100%    Last Pain:  Vitals:   05/15/19 1400  TempSrc:   PainSc: 0-No pain                 Karanveer Ramakrishnan A.

## 2019-05-15 NOTE — Anesthesia Procedure Notes (Addendum)
Anesthesia Regional Block: Pectoralis block   Pre-Anesthetic Checklist: ,, timeout performed, Correct Patient, Correct Site, Correct Laterality, Correct Procedure, Correct Position, site marked, Risks and benefits discussed,  Surgical consent,  Pre-op evaluation,  At surgeon's request and post-op pain management  Laterality: Right  Prep: chloraprep       Needles:  Injection technique: Single-shot  Needle Type: Echogenic Stimulator Needle     Needle Length: 9cm  Needle Gauge: 21   Needle insertion depth: 7 cm   Additional Needles:   Procedures:,,,, ultrasound used (permanent image in chart),,,,  Narrative:  Start time: 05/15/2019 10:43 AM End time: 05/15/2019 10:48 AM Injection made incrementally with aspirations every 5 mL.  Performed by: Personally  Anesthesiologist: Josephine Igo, MD  Additional Notes: Timeout performed. Patient sedated. Relevant anatomy ID'd using [place]. Incremental 2-80ml injection of LA with frequent aspiration. Patient tolerated procedure well.        Right Pectoralis Block

## 2019-05-15 NOTE — Anesthesia Preprocedure Evaluation (Signed)
Anesthesia Evaluation  Patient identified by MRN, date of birth, ID band Patient awake    Reviewed: Allergy & Precautions, NPO status , Patient's Chart, lab work & pertinent test results, reviewed documented beta blocker date and time   Airway Mallampati: II  TM Distance: >3 FB Neck ROM: Full    Dental no notable dental hx. (+) Teeth Intact   Pulmonary neg pulmonary ROS,    Pulmonary exam normal breath sounds clear to auscultation       Cardiovascular negative cardio ROS Normal cardiovascular exam+ dysrhythmias Atrial Fibrillation  Rhythm:Regular Rate:Normal     Neuro/Psych  Headaches, negative psych ROS   GI/Hepatic negative GI ROS, Neg liver ROS,   Endo/Other    Renal/GU Right Breast Ca  negative genitourinary   Musculoskeletal  (+) Arthritis , Osteoarthritis,    Abdominal   Peds  Hematology negative hematology ROS (+)   Anesthesia Other Findings   Reproductive/Obstetrics                             Anesthesia Physical Anesthesia Plan  ASA: II  Anesthesia Plan: General   Post-op Pain Management:  Regional for Post-op pain   Induction: Intravenous  PONV Risk Score and Plan: 4 or greater and Midazolam, Ondansetron and Treatment may vary due to age or medical condition  Airway Management Planned: LMA  Additional Equipment:   Intra-op Plan:   Post-operative Plan: Extubation in OR  Informed Consent: I have reviewed the patients History and Physical, chart, labs and discussed the procedure including the risks, benefits and alternatives for the proposed anesthesia with the patient or authorized representative who has indicated his/her understanding and acceptance.     Dental advisory given  Plan Discussed with: CRNA and Surgeon  Anesthesia Plan Comments:         Anesthesia Quick Evaluation

## 2019-05-15 NOTE — Anesthesia Procedure Notes (Signed)
Procedure Name: LMA Insertion Date/Time: 05/15/2019 11:44 AM Performed by: Maryella Shivers, CRNA Pre-anesthesia Checklist: Patient identified, Emergency Drugs available, Suction available and Patient being monitored Patient Re-evaluated:Patient Re-evaluated prior to induction Oxygen Delivery Method: Circle system utilized Preoxygenation: Pre-oxygenation with 100% oxygen Induction Type: IV induction Ventilation: Mask ventilation without difficulty LMA: LMA inserted LMA Size: 4.0 Number of attempts: 1 Airway Equipment and Method: Bite block Placement Confirmation: positive ETCO2 Tube secured with: Tape Dental Injury: Teeth and Oropharynx as per pre-operative assessment

## 2019-05-15 NOTE — Transfer of Care (Signed)
Immediate Anesthesia Transfer of Care Note  Patient: Joanne Hansen  Procedure(s) Performed: RIGHT BREAST LUMPECTOMY WITH RADIOACTIVE SEED AND SENTINEL LYMPH NODE MAPPING (Right Breast) INSERTION PORT-A-CATH WITH ULTRASOUND (Right Chest)  Patient Location: PACU  Anesthesia Type:General and regional  Level of Consciousness: awake, alert  and oriented  Airway & Oxygen Therapy: Patient Spontanous Breathing and Patient connected to face mask oxygen  Post-op Assessment: Report given to RN and Post -op Vital signs reviewed and stable  Post vital signs: Reviewed and stable  Last Vitals:  Vitals Value Taken Time  BP    Temp    Pulse 94 05/15/19 1322  Resp    SpO2 100 % 05/15/19 1322  Vitals shown include unvalidated device data.  Last Pain:  Vitals:   05/15/19 1100  TempSrc:   PainSc: 0-No pain      Patients Stated Pain Goal: 3 ([id number])  Complications: No apparent anesthesia complications

## 2019-05-15 NOTE — Progress Notes (Signed)
I left a voicemail for Joanne Hansen and I also spoke with her husband via telephone to inform them that if she remains quarantined at home until 05/18/2019 she will not need to repeat the COVID testing prior to surgery on 05/18/2019.  I also asked him to call us tomorrow of Thursday for [place] to go over her medical history and give instructions for Friday. Mr. Plumadore verbalized understanding.

## 2019-05-16 ENCOUNTER — Telehealth: Payer: Self-pay | Admitting: Licensed Clinical Social Worker

## 2019-05-16 ENCOUNTER — Encounter: Payer: Self-pay | Admitting: Licensed Clinical Social Worker

## 2019-05-16 ENCOUNTER — Other Ambulatory Visit: Payer: Self-pay

## 2019-05-16 ENCOUNTER — Encounter (HOSPITAL_BASED_OUTPATIENT_CLINIC_OR_DEPARTMENT_OTHER): Payer: Self-pay | Admitting: Surgery

## 2019-05-16 ENCOUNTER — Ambulatory Visit: Payer: Self-pay | Admitting: Licensed Clinical Social Worker

## 2019-05-16 DIAGNOSIS — Z803 Family history of malignant neoplasm of breast: Secondary | ICD-10-CM

## 2019-05-16 DIAGNOSIS — Z1379 Encounter for other screening for genetic and chromosomal anomalies: Secondary | ICD-10-CM | POA: Insufficient documentation

## 2019-05-16 DIAGNOSIS — Z800 Family history of malignant neoplasm of digestive organs: Secondary | ICD-10-CM

## 2019-05-16 DIAGNOSIS — C50211 Malignant neoplasm of upper-inner quadrant of right female breast: Secondary | ICD-10-CM

## 2019-05-16 DIAGNOSIS — Z171 Estrogen receptor negative status [ER-]: Secondary | ICD-10-CM

## 2019-05-16 DIAGNOSIS — Z808 Family history of malignant neoplasm of other organs or systems: Secondary | ICD-10-CM

## 2019-05-16 NOTE — Progress Notes (Signed)
Spoke w/ pt via phone for pre-op interview.  Npo after mn.  Arrive at 0530.  Current lab results (cbc/diff, cmp) and ekg in chart and epic.  Will take toprol am dos w/ sips of water. Pt had covid test done 05-12-2019 for surgery she had on 05-15-2019.  Received approval from dr Jillyn Hidden mda that pt does not need to have another covid test for this procedure on 05-18-2019.  Pt did verify she and her husband has been and will be self-quarantined until 05-18-2019.   Pt had right breast lumpectomy and port-a-cath insertion, she has dressing's and binder on.    Per pt was given rectal prep instructions from dr Marcello Moores office.   PCP -  dr Micheline Rough Cardiologist -  dr Lowella Dandy cossu for PAF (from [place],  [place] note scanned in epic, dated 02-28-2018, also placed in chart.  Chest x-ray -   EKG -  05-09-2019 epic Stress Test - no ECHO - 04-15-2014  (with chart) and  normal carotid doppler 04-16-2014 (with chart) Cardiac Cath - no  Sleep Study - no CPAP -   Fasting Blood Sugar - n/a Checks Blood Sugar _____ times a day  Blood Thinner Instructions: no Aspirin Instructions: no Last Dose:  Anesthesia review: pt hx PAF episode w/ near syncope 08/ 2020. Takes BB.  Per pt rare occasion palpitations that last few seconds without sob or chest pain, last episode approx. 3 months ago.  One episode of dizziness one month ago, laid down put feet up and went away in couple of minutes.  Pt from [place] just moved to [place] to be near daughter 3-4 months ago.  Patient denies shortness of breath, fever, cough and chest pain at phone interview.

## 2019-05-16 NOTE — Progress Notes (Signed)
HPI:  Ms. Gaffin was previously seen in the [place] clinic due to a personal and family history of cancer and concerns regarding a hereditary predisposition to cancer. Please refer to our prior cancer genetics clinic note for more information regarding our discussion, assessment and recommendations, at the time. Ms. Acheampong recent genetic test results were disclosed to her, as were recommendations warranted by these results. These results and recommendations are discussed in more detail below.  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 No pathogenic variants identified. The STAT Breast cancer panel offered by [facility] includes sequencing and rearrangement analysis for the following 9 genes:  ATM, BRCA1, BRCA2, CDH1, CHEK2, PALB2, PTEN, STK11 and TP53.  The Common Hereditary Cancers Panel offered by [facility] includes sequencing and/or deletion duplication testing of the following 47 genes: APC, ATM, AXIN2, BARD1, BMPR1A, BRCA1, BRCA2, BRIP1, CDH1, CDKN2A (p14ARF), CDKN2A (p16INK4a), CKD4, CHEK2, CTNNA1, DICER1, EPCAM (Deletion/duplication testing only), GREM1 (promoter region deletion/duplication testing only), KIT, MEN1, MLH1, MSH2, MSH3, MSH6, MUTYH, NBN, NF1, NHTL1, PALB2, PDGFRA, PMS2, POLD1, POLE, PTEN, RAD50, RAD51C, RAD51D, SDHB, SDHC, SDHD, SMAD4, SMARCA4. STK11, TP53, TSC1, TSC2, and VHL.  The following genes were evaluated for sequence changes  only: SDHA and HOXB13 c.251G>A variant only. The report date is 05/15/2019.    05/29/2019 -  Chemotherapy   The patient had palonosetron (ALOXI) injection 0.25 mg, 0.25 mg, Intravenous,  Once, 0 of 4 cycles pegfilgrastim (NEULASTA ONPRO KIT) injection 6 mg, 6 mg, Subcutaneous, Once, 0 of 4 cycles cyclophosphamide (CYTOXAN) 960 mg in sodium chloride 0.9 % 250 mL chemo infusion, 600 mg/m2, Intravenous,  Once, 0 of 4 cycles DOCEtaxel (TAXOTERE) 120 mg in sodium chloride 0.9 % 250 mL chemo infusion, 75 mg/m2, Intravenous,  Once, 0 of 4 cycles  for chemotherapy treatment.      FAMILY HISTORY:  We obtained a detailed, 4-generation family history.  Significant diagnoses are listed below: Family History  Problem Relation Age of Onset   COPD Mother    Cancer Mother 50       colon   Arthritis Mother    Hypertension Mother    33 / [place] Mother    Stroke Mother 14   Alzheimer's disease Mother 25   Heart disease Father    Heart attack Father 13   Lung disease Father    Arthritis Sister    Breast cancer Sister 67       Lumpectomy   Diabetes Maternal Grandmother    Breast cancer Niece 79       Louise's Daughter   Cervical cancer Niece 21    Ms. Hartsough has one daughter, age 55, no history of cancer. She has 3 sisters. One of her sisters, Cecile Hearing, was recently diagnosed with breast cancer at 81. Another sister has a daughter who was diagnosed with breast cancer at 14 and cervical cancer at 62. Patient is unsure if this niece has had genetic testing.  Ms. Weems mother was diagnosed with colon  cancer at 35 and died at 55. Patient has 2 maternal uncles, 3 maternal aunts, no cancers she is aware of. One of her maternal cousins died from brain cancer in her 69s. No other cancers in maternal cousins. Patient's maternal grandmother died at 45 no cancers, maternal grandfather died in his 65s.  Ms. Tigue father died at 55, no cancers. She has limited information  about this side of the family. She knows of one paternal aunt who is deceased. She is unaware of paternal cousins. She does not have information about paternal grandparents.  Ms. Zeiser is unaware of previous family history of genetic testing for hereditary cancer risks. Patient's maternal ancestors are of [place] descent, and paternal ancestors are of unknown descent. There is no reported Ashkenazi Jewish ancestry. There is no known consanguinity.  GENETIC TEST RESULTS: Genetic testing reported out on 05/15/2019 through the [facility] Breast Cancer STAT Panel + Common Hereditary cancer panel found no pathogenic mutations. The STAT Breast cancer panel offered by [facility] includes sequencing and rearrangement analysis for the following 9 genes:  ATM, BRCA1, BRCA2, CDH1, CHEK2, PALB2, PTEN, STK11 and TP53.  The Common Hereditary Cancers Panel offered by [facility] includes sequencing and/or deletion duplication testing of the following 47 genes: APC, ATM, AXIN2, BARD1, BMPR1A, BRCA1, BRCA2, BRIP1, CDH1, CDKN2A (p14ARF), CDKN2A (p16INK4a), CKD4, CHEK2, CTNNA1, DICER1, EPCAM (Deletion/duplication testing only), GREM1 (promoter region deletion/duplication testing only), KIT, MEN1, MLH1, MSH2, MSH3, MSH6, MUTYH, NBN, NF1, NHTL1, PALB2, PDGFRA, PMS2, POLD1, POLE, PTEN, RAD50, RAD51C, RAD51D, SDHB, SDHC, SDHD, SMAD4, SMARCA4. STK11, TP53, TSC1, TSC2, and VHL.  The following genes were evaluated for sequence changes only: SDHA and HOXB13 c.251G>A variant only.. The test report has been scanned into EPIC and is located under the Molecular Pathology section of the Results Review tab.  A portion of the result report is included below for reference.     We discussed with Ms. Arthurs that because current genetic testing is not perfect, it is possible there may be a gene mutation in one of these genes that current testing cannot detect, but that chance is small.  We also discussed, that there could be another gene that has not  yet been discovered, or that we have not yet tested, that is responsible for the cancer diagnoses in the family. It is also possible there is a hereditary cause for the cancer in the family that Ms. Nemitz did not inherit and therefore was not identified in her testing.  Therefore, it is important to remain in touch with cancer genetics in the future so that we can continue to offer Ms. Weisheit the most up to date genetic testing.   Genetic testing did identify a variant of uncertain significance (VUS) in the TSC2 gene called c.1014C>G.  At this time, it is unknown if this variant is associated with increased cancer risk or if this is a normal finding, but most variants such as this get reclassified to being inconsequential. It should not be used to make medical management decisions. With time, we suspect the lab will determine the significance of this variant, if any. If we do learn more about it, we will try to contact Ms. Tafolla to discuss it further. However, it is important to stay in touch with [place] periodically and keep the address and phone number up to date.  ADDITIONAL GENETIC TESTING: We discussed with Ms. Pask that her genetic testing was fairly extensive.  If there are genes identified to increase cancer risk that can be analyzed  in the future, we would be happy to discuss and coordinate this testing at that time.    CANCER SCREENING RECOMMENDATIONS: Ms. Newhard test result is considered negative (normal).  This means that we have not identified a hereditary cause for her  personal and family history of cancer at this time. Most cancers happen by chance and this negative test suggests that her cancer may fall into this category.    While reassuring, this does not definitively rule out a hereditary predisposition to cancer. It is still possible that there could be genetic mutations that are undetectable by current technology. There could be genetic mutations in genes that have not been  tested or identified to increase cancer risk.  Therefore, it is recommended she continue to follow the cancer management and screening guidelines provided by her oncology and primary healthcare provider.   An individual's cancer risk and medical management are not determined by genetic test results alone. Overall cancer risk assessment incorporates additional factors, including personal medical history, family history, and any available genetic information that may result in a personalized plan for cancer prevention and surveillance  RECOMMENDATIONS FOR FAMILY MEMBERS:  Relatives in this family might be at some increased risk of developing cancer, over the general population risk, simply due to the family history of cancer.  We recommended female relatives in this family have a yearly mammogram beginning at age 53, or 96 years younger than the earliest onset of cancer, an annual clinical breast exam, and perform monthly breast self-exams. Female relatives in this family should also have a gynecological exam as recommended by their primary provider. All family members should have a colonoscopy by age 76, or as directed by their physicians.   It is also possible there is a hereditary cause for the cancer in Ms. Wasilewski family that she did not inherit and therefore was not identified in her.  Based on Ms. Pruitt family history, we recommended her niece have genetic counseling and testing. Ms. Yorio will let us know if we can be of any assistance in coordinating genetic counseling and/or testing for these family members.  FOLLOW-UP: Lastly, we discussed with Ms. Heft that cancer genetics is a rapidly advancing field and it is possible that new genetic tests will be appropriate for her and/or her family members in the future. We encouraged her to remain in contact with cancer genetics on an annual basis so we can update her personal and family histories and let her know of advances in cancer genetics  that may benefit this family.   Our contact number was provided. Ms. Ingles questions were answered to her satisfaction, and she knows she is welcome to call us at anytime with additional questions or concerns.   Faith Rogue, MS, [facility] Genetic Counselor [email or web address] Phone: [phone]

## 2019-05-16 NOTE — Addendum Note (Signed)
Addendum  created 05/16/19 1043 by Tawni Millers, CRNA   Charge Capture section accepted

## 2019-05-16 NOTE — Telephone Encounter (Signed)
Revealed negative genetic testing.  Revealed that a VUS in TSC2 was identified. This normal result is reassuring and indicates that it is unlikely Joanne Hansen cancer is due to a hereditary cause.  It is unlikely that there is an increased risk of another cancer due to a mutation in one of these genes.  However, genetic testing is not perfect, and cannot definitively rule out a hereditary cause.  It will be important for her to keep in contact with genetics to learn if any additional testing may be needed in the future.

## 2019-05-17 ENCOUNTER — Other Ambulatory Visit (HOSPITAL_COMMUNITY): Payer: Medicare Other

## 2019-05-17 LAB — SURGICAL PATHOLOGY

## 2019-05-18 ENCOUNTER — Ambulatory Visit (HOSPITAL_BASED_OUTPATIENT_CLINIC_OR_DEPARTMENT_OTHER)
Admission: RE | Admit: 2019-05-18 | Discharge: 2019-05-18 | Disposition: A | Discharge status: Home / Self Care | Payer: Medicare Other | Source: Ambulatory Visit | Attending: General Surgery | Admitting: General Surgery

## 2019-05-18 ENCOUNTER — Other Ambulatory Visit: Payer: Self-pay | Admitting: Oncology

## 2019-05-18 ENCOUNTER — Ambulatory Visit (HOSPITAL_BASED_OUTPATIENT_CLINIC_OR_DEPARTMENT_OTHER): Payer: Medicare Other | Admitting: Physician Assistant

## 2019-05-18 ENCOUNTER — Encounter (HOSPITAL_BASED_OUTPATIENT_CLINIC_OR_DEPARTMENT_OTHER)
Admission: RE | Disposition: A | Discharge status: Home / Self Care | Payer: Self-pay | Source: Ambulatory Visit | Attending: General Surgery

## 2019-05-18 ENCOUNTER — Encounter (HOSPITAL_BASED_OUTPATIENT_CLINIC_OR_DEPARTMENT_OTHER): Payer: Self-pay | Admitting: *Deleted

## 2019-05-18 DIAGNOSIS — Z87891 Personal history of nicotine dependence: Secondary | ICD-10-CM | POA: Diagnosis not present

## 2019-05-18 DIAGNOSIS — I4891 Unspecified atrial fibrillation: Secondary | ICD-10-CM | POA: Insufficient documentation

## 2019-05-18 DIAGNOSIS — Z880 Allergy status to penicillin: Secondary | ICD-10-CM | POA: Insufficient documentation

## 2019-05-18 DIAGNOSIS — D128 Benign neoplasm of rectum: Secondary | ICD-10-CM | POA: Insufficient documentation

## 2019-05-18 DIAGNOSIS — M1990 Unspecified osteoarthritis, unspecified site: Secondary | ICD-10-CM | POA: Diagnosis not present

## 2019-05-18 DIAGNOSIS — R159 Full incontinence of feces: Secondary | ICD-10-CM | POA: Insufficient documentation

## 2019-05-18 DIAGNOSIS — E7800 Pure hypercholesterolemia, unspecified: Secondary | ICD-10-CM | POA: Insufficient documentation

## 2019-05-18 DIAGNOSIS — Z9842 Cataract extraction status, left eye: Secondary | ICD-10-CM | POA: Insufficient documentation

## 2019-05-18 DIAGNOSIS — Z9841 Cataract extraction status, right eye: Secondary | ICD-10-CM | POA: Diagnosis not present

## 2019-05-18 DIAGNOSIS — Z79899 Other long term (current) drug therapy: Secondary | ICD-10-CM | POA: Insufficient documentation

## 2019-05-18 DIAGNOSIS — K621 Rectal polyp: Secondary | ICD-10-CM | POA: Diagnosis present

## 2019-05-18 DIAGNOSIS — K6289 Other specified diseases of anus and rectum: Secondary | ICD-10-CM | POA: Insufficient documentation

## 2019-05-18 DIAGNOSIS — Z800 Family history of malignant neoplasm of digestive organs: Secondary | ICD-10-CM | POA: Diagnosis not present

## 2019-05-18 HISTORY — DX: Mixed incontinence: N39.46

## 2019-05-18 HISTORY — DX: Nontoxic goiter, unspecified: E04.9

## 2019-05-18 HISTORY — DX: Other specified diseases of anus and rectum: K62.89

## 2019-05-18 HISTORY — DX: Personal history of other specified conditions: Z87.898

## 2019-05-18 HISTORY — DX: Paroxysmal atrial fibrillation: I48.0

## 2019-05-18 HISTORY — DX: Malignant neoplasm of unspecified site of unspecified female breast: C50.919

## 2019-05-18 HISTORY — DX: Estrogen receptor negative status (ER-): Z17.1

## 2019-05-18 HISTORY — PX: FLEXIBLE SIGMOIDOSCOPY: SHX5431

## 2019-05-18 HISTORY — DX: Estrogen receptor negative status (ER-): C50.211

## 2019-05-18 SURGERY — SIGMOIDOSCOPY, FLEXIBLE
Anesthesia: Monitor Anesthesia Care | Site: Rectum

## 2019-05-18 MED ORDER — KETAMINE HCL 10 MG/ML IJ SOLN
INTRAMUSCULAR | Status: AC
  Filled 2019-05-18: qty 1

## 2019-05-18 MED ORDER — MIDAZOLAM HCL 2 MG/2ML IJ SOLN
INTRAMUSCULAR | Status: DC | PRN
  Administered 2019-05-18: 2 mg via INTRAVENOUS

## 2019-05-18 MED ORDER — DEXAMETHASONE SODIUM PHOSPHATE 10 MG/ML IJ SOLN
INTRAMUSCULAR | Status: DC | PRN
  Administered 2019-05-18: 5 mg via INTRAVENOUS

## 2019-05-18 MED ORDER — FENTANYL CITRATE (PF) 100 MCG/2ML IJ SOLN
INTRAMUSCULAR | Status: AC
  Filled 2019-05-18: qty 2

## 2019-05-18 MED ORDER — MIDAZOLAM HCL 2 MG/2ML IJ SOLN
INTRAMUSCULAR | Status: AC
  Filled 2019-05-18: qty 2

## 2019-05-18 MED ORDER — LACTATED RINGERS IV SOLN
INTRAVENOUS | Status: DC
  Administered 2019-05-18: 06:00:00 via INTRAVENOUS
  Filled 2019-05-18: qty 1000

## 2019-05-18 MED ORDER — LIDOCAINE 2% (20 MG/ML) 5 ML SYRINGE
INTRAMUSCULAR | Status: DC | PRN
  Administered 2019-05-18: 25 mg via INTRAVENOUS

## 2019-05-18 MED ORDER — OXYCODONE HCL 5 MG PO TABS
5.0000 mg | ORAL_TABLET | Freq: Once | ORAL | Status: DC | PRN
  Filled 2019-05-18: qty 1

## 2019-05-18 MED ORDER — SODIUM CHLORIDE 0.9 % IV SOLN
Freq: Once | INTRAVENOUS | Status: DC
  Filled 2019-05-18 (×2): qty 1.2

## 2019-05-18 MED ORDER — ACETAMINOPHEN 10 MG/ML IV SOLN
1000.0000 mg | Freq: Once | INTRAVENOUS | Status: DC | PRN
  Filled 2019-05-18: qty 100

## 2019-05-18 MED ORDER — FENTANYL CITRATE (PF) 100 MCG/2ML IJ SOLN
25.0000 ug | INTRAMUSCULAR | Status: DC | PRN
  Filled 2019-05-18: qty 1

## 2019-05-18 MED ORDER — ACETAMINOPHEN 500 MG PO TABS
ORAL_TABLET | ORAL | Status: AC
  Filled 2019-05-18: qty 2

## 2019-05-18 MED ORDER — ACETAMINOPHEN 160 MG/5ML PO SOLN
1000.0000 mg | Freq: Once | ORAL | Status: DC | PRN
  Filled 2019-05-18: qty 40.6

## 2019-05-18 MED ORDER — BUPIVACAINE HCL (PF) 0.25 % IJ SOLN
INTRAMUSCULAR | Status: DC | PRN
  Administered 2019-05-18: 30 mL

## 2019-05-18 MED ORDER — PROPOFOL 10 MG/ML IV BOLUS
INTRAVENOUS | Status: AC
  Filled 2019-05-18: qty 20

## 2019-05-18 MED ORDER — PROPOFOL 500 MG/50ML IV EMUL
INTRAVENOUS | Status: DC | PRN
  Administered 2019-05-18: 200 ug/kg/min via INTRAVENOUS

## 2019-05-18 MED ORDER — PROPOFOL 500 MG/50ML IV EMUL
INTRAVENOUS | Status: AC
  Filled 2019-05-18: qty 50

## 2019-05-18 MED ORDER — KETAMINE INJECTION FOR OPTIME (MG/KG/HR) DOCUMENTATION
INTRAVENOUS | Status: DC | PRN
  Administered 2019-05-18: 1 mg/kg/h via INTRAVENOUS

## 2019-05-18 MED ORDER — STERILE WATER FOR IRRIGATION IR SOLN
Status: DC | PRN
  Administered 2019-05-18 (×2): 500 mL

## 2019-05-18 MED ORDER — ONDANSETRON HCL 4 MG/2ML IJ SOLN
INTRAMUSCULAR | Status: DC | PRN
  Administered 2019-05-18: 4 mg via INTRAVENOUS

## 2019-05-18 MED ORDER — ACETAMINOPHEN 500 MG PO TABS
1000.0000 mg | ORAL_TABLET | ORAL | Status: AC
  Administered 2019-05-18: 1000 mg via ORAL
  Filled 2019-05-18: qty 2

## 2019-05-18 MED ORDER — ACETAMINOPHEN 500 MG PO TABS
1000.0000 mg | ORAL_TABLET | Freq: Once | ORAL | Status: DC | PRN
  Filled 2019-05-18: qty 2

## 2019-05-18 MED ORDER — OXYCODONE HCL 5 MG/5ML PO SOLN
5.0000 mg | Freq: Once | ORAL | Status: DC | PRN
  Filled 2019-05-18: qty 5

## 2019-05-18 SURGICAL SUPPLY — 46 items
BENZOIN TINCTURE PRP APPL 2/3 (GAUZE/BANDAGES/DRESSINGS) ×1 IMPLANT
BLADE EXTENDED COATED 6.5IN (ELECTRODE) IMPLANT
BLADE HEX COATED 2.75 (ELECTRODE) ×1 IMPLANT
BLADE SURG 10 STRL SS (BLADE) ×1 IMPLANT
BRIEF STRETCH FOR OB PAD LRG (UNDERPADS AND DIAPERS) ×3 IMPLANT
CANISTER SUCT 3000ML PPV (MISCELLANEOUS) ×3 IMPLANT
COVER BACK TABLE 60X90IN (DRAPES) ×3 IMPLANT
COVER MAYO STAND STRL (DRAPES) ×3 IMPLANT
COVER WAND RF STERILE (DRAPES) ×4 IMPLANT
DECANTER SPIKE VIAL GLASS SM (MISCELLANEOUS) ×3 IMPLANT
DRAPE HYSTEROSCOPY (DRAPE) IMPLANT
DRAPE LAPAROTOMY 100X72 PEDS (DRAPES) ×3 IMPLANT
DRAPE SHEET LG 3/4 BI-LAMINATE (DRAPES) IMPLANT
DRAPE UTILITY XL STRL (DRAPES) ×3 IMPLANT
ELECT REM PT RETURN 9FT ADLT (ELECTROSURGICAL) ×3
ELECTRODE REM PT RTRN 9FT ADLT (ELECTROSURGICAL) ×1 IMPLANT
GAUZE SPONGE 4X4 12PLY STRL (GAUZE/BANDAGES/DRESSINGS) IMPLANT
GAUZE SPONGE 4X4 12PLY STRL LF (GAUZE/BANDAGES/DRESSINGS) ×2 IMPLANT
GLOVE BIO SURGEON STRL SZ 6.5 (GLOVE) ×2 IMPLANT
GLOVE BIO SURGEONS STRL SZ 6.5 (GLOVE) ×1
GOWN STRL REUS W/TWL LRG LVL3 (GOWN DISPOSABLE) ×2 IMPLANT
GOWN STRL REUS W/TWL XL LVL3 (GOWN DISPOSABLE) ×7 IMPLANT
KIT SIGMOIDOSCOPE (SET/KITS/TRAYS/PACK) IMPLANT
KIT TURNOVER CYSTO (KITS) ×3 IMPLANT
LEGGING LITHOTOMY PAIR STRL (DRAPES) IMPLANT
NDL SAFETY ECLIPSE 18X1.5 (NEEDLE) IMPLANT
NEEDLE HYPO 18GX1.5 SHARP (NEEDLE)
NEEDLE HYPO 22GX1.5 SAFETY (NEEDLE) ×3 IMPLANT
NS IRRIG 500ML POUR BTL (IV SOLUTION) ×3 IMPLANT
PACK BASIN DAY SURGERY FS (CUSTOM PROCEDURE TRAY) ×3 IMPLANT
PAD ABD 8X10 STRL (GAUZE/BANDAGES/DRESSINGS) ×4 IMPLANT
PAD ARMBOARD 7.5X6 YLW CONV (MISCELLANEOUS) IMPLANT
PENCIL BUTTON HOLSTER BLD 10FT (ELECTRODE) ×3 IMPLANT
SPONGE SURGIFOAM ABS GEL 12-7 (HEMOSTASIS) IMPLANT
SUT CHROMIC 2 0 SH (SUTURE) ×1 IMPLANT
SUT CHROMIC 3 0 SH 27 (SUTURE) IMPLANT
SUT MON AB 3-0 SH 27 (SUTURE)
SUT MON AB 3-0 SH27 (SUTURE) ×1 IMPLANT
SUT VIC AB 2-0 SH 27 (SUTURE) ×2
SUT VIC AB 2-0 SH 27XBRD (SUTURE) IMPLANT
SYR CONTROL 10ML LL (SYRINGE) ×3 IMPLANT
TOWEL NATURAL 6PK STERILE (DISPOSABLE) ×3 IMPLANT
TRAY DSU PREP LF (CUSTOM PROCEDURE TRAY) ×3 IMPLANT
TUBE CONNECTING 12'X1/4 (SUCTIONS) ×2
TUBE CONNECTING 12X1/4 (SUCTIONS) ×3 IMPLANT
YANKAUER SUCT BULB TIP NO VENT (SUCTIONS) ×3 IMPLANT

## 2019-05-18 NOTE — Progress Notes (Signed)
Noting Dr Marcello Moores' note  Joanne Ruff, MD  Bellamy Judson, Virgie Dad, MD        [name], I did a rectal surgery on her today. I was able to visualize a moderate sized rectal polyp proximally. I was unable to excise or remove it due to location and equipment availability. I would recommend that she be sent back to her GI MD for colonoscopy and polyp removal. It appeared completely benign and could probably wait til after chemo though.   Joanne Hansen

## 2019-05-18 NOTE — Discharge Instructions (Addendum)

## 2019-05-18 NOTE — Transfer of Care (Signed)
Immediate Anesthesia Transfer of Care Note  Patient: Joanne Hansen  Procedure(s) Performed: FLEXIBLE SIGMOIDOSCOPY,  TRANSANAL EXCISION inclusion cyst (N/A Rectum)  Patient Location: PACU  Anesthesia Type:MAC  Level of Consciousness: awake, alert , oriented and patient cooperative  Airway & Oxygen Therapy: Patient Spontanous Breathing and Patient connected to nasal cannula oxygen  Post-op Assessment: Report given to RN and Post -op Vital signs reviewed and stable  Post vital signs: Reviewed and stable  Last Vitals:  Vitals Value Taken Time  BP    Temp    Pulse    Resp    SpO2      Last Pain:  Vitals:   05/18/19 0551  TempSrc: Oral  PainSc: 0-No pain         Complications: No apparent anesthesia complications

## 2019-05-18 NOTE — Anesthesia Procedure Notes (Signed)
Procedure Name: MAC Date/Time: 05/18/2019 7:35 AM Performed by: Wanita Chamberlain, CRNA Pre-anesthesia Checklist: Patient identified, Emergency Drugs available, Suction available and Patient being monitored Patient Re-evaluated:Patient Re-evaluated prior to induction Oxygen Delivery Method: Nasal cannula Induction Type: IV induction Placement Confirmation: CO2 detector and positive ETCO2 Dental Injury: Teeth and Oropharynx as per pre-operative assessment

## 2019-05-18 NOTE — Anesthesia Preprocedure Evaluation (Addendum)
Anesthesia Evaluation  Patient identified by MRN, date of birth, ID band Patient awake    Reviewed: Allergy & Precautions, NPO status , Patient's Chart, lab work & pertinent test results, reviewed documented beta blocker date and time   Airway Mallampati: II  TM Distance: >3 FB Neck ROM: Full    Dental  (+) Teeth Intact, Dental Advisory Given   Pulmonary neg pulmonary ROS, neg recent URI,  covid - 05/12/19   breath sounds clear to auscultation       Cardiovascular negative cardio ROS  + dysrhythmias Atrial Fibrillation  Rhythm:Regular Rate:Normal     Neuro/Psych  Headaches, negative psych ROS   GI/Hepatic negative GI ROS, Neg liver ROS,   Endo/Other  negative endocrine ROS  Renal/GU negative Renal ROS  negative genitourinary   Musculoskeletal  (+) Arthritis , Osteoarthritis,    Abdominal   Peds  Hematology negative hematology ROS (+)   Anesthesia Other Findings S/p excision right breast CA 05/15/19  Reproductive/Obstetrics                            Anesthesia Physical Anesthesia Plan  ASA: II  Anesthesia Plan: MAC   Post-op Pain Management:    Induction:   PONV Risk Score and Plan: 2 and Treatment may vary due to age or medical condition and Propofol infusion  Airway Management Planned: Nasal Cannula  Additional Equipment: None  Intra-op Plan:   Post-operative Plan:   Informed Consent: I have reviewed the patients History and Physical, chart, labs and discussed the procedure including the risks, benefits and alternatives for the proposed anesthesia with the patient or authorized representative who has indicated his/her understanding and acceptance.     Dental advisory given  Plan Discussed with: CRNA and Surgeon  Anesthesia Plan Comments:         Anesthesia Quick Evaluation

## 2019-05-18 NOTE — Op Note (Signed)
05/18/2019  8:14 AM  PATIENT:  Joanne Hansen  73 y.o. female  Patient Care Team: Caren Macadam, MD as PCP - General (Family Medicine) Magrinat, Virgie Dad, MD as Consulting Physician (Oncology) Erroll Luna, MD as Consulting Physician (General Surgery) Kyung Rudd, MD as Consulting Physician (Radiation Oncology) Hayden Pedro, PA-C as Physician Assistant (Radiation Oncology) Leighton Ruff, MD as Consulting Physician (General Surgery)  PRE-OPERATIVE DIAGNOSIS:  RECTAL CYST  POST-OPERATIVE DIAGNOSIS:  RECTAL INCLUSION CYST, PROXIMAL RECTAL POLYP  PROCEDURE:   FLEXIBLE SIGMOIDOSCOPY, TRANSANAL EXCISION OF RECTAL MASS    Surgeon(s): Leighton Ruff, MD  ASSISTANT: none   ANESTHESIA:   local and MAC  SPECIMEN:  Source of Specimen:  rectal cyst  DISPOSITION OF SPECIMEN:  PATHOLOGY  COUNTS:  YES  PLAN OF CARE: Discharge to home after PACU  PATIENT DISPOSITION:  PACU - hemodynamically stable.  INDICATION: 73 y.o. F with fecal incontinence and a rectal cyst palpated during physical exam.  I have recommended excision to rule out carcinoid tumor.   OR FINDINGS: inclusion cyst, proximal rectal polyp (not removed)  DESCRIPTION: the patient was identified in the preoperative holding area and taken to the OR where they were laid on the operating room table.   anesthesia was induced without difficulty. The patient was then positioned in left lateral position with buttocks gently taped apart.  The patient was then prepped and draped in usual sterile fashion.  SCDs were noted to be in place prior to the initiation of anesthesia. A surgical timeout was performed indicating the correct patient, procedure, positioning and need for preoperative antibiotics.  A rectal block was performed using Marcaine with epinephrine.    I began with a digital rectal exam.  The mass was palpated at the L lateral distal rectum.  I then performed a flexible proctoscopy to evaluate this  further.  The mass was clearly seen with no mucosal features in the distal rectum.  I advanced the scope to the recto-sigmoid junction.  There was a 50m polyp in the proximal rectum on the 3rd valve.  I did not have instrumentation to remove or biopsy this and therefore it was left in place.  I then placed a Hill-Ferguson anoscope into the anal canal and evaluated this completely.  The patient does have some grade 2 internal hemorrhoids.  There was decreased sphincter tone.  I placed the JMartiniqueanal retractor and began removing the mass.  I incised the mucosa with cautery and lifted the mass from the wall of the rectum.  This was clearly an inclusion cyst.  The entire wall was removed and the specimen was sent to pathology.  I then closed my incision with a running 3-0 Vicryl suture.  Hemostasis was good.  The patient was awakened from anesthesia and sent to the PACU in stable condition.  All counts were correct per OR staff.    Patient will need colonoscopy in the future to remove her rectal polyp.

## 2019-05-18 NOTE — H&P (Signed)
The patient is a 73 year old female who presents with fecal incontinence. 73 year old female who presents to the office with complaints of fecal incontinence. She states that she has leakage after bowel movements only. She occasionally has incontinence to flatus as well. This is been going on for several years. She was involved in a study in [place] for Solesta injections but this showed no improvement in her symptoms. She has tried Metamucil supplementation in the past. She has a history of constipation, but has improved this with dietary modifications.   Past Surgical History Mammie Lorenzo, LPN; 579FGE 579FGE PM) Appendectomy Breast Biopsy Right. Cataract Surgery Bilateral. Foot Surgery Right. Hysterectomy (not due to cancer) - Complete Oral Surgery Shoulder Surgery Left. Spinal Surgery - Lower Back  Diagnostic Studies History Mammie Lorenzo, LPN; 579FGE 579FGE PM) Colonoscopy 5-10 years ago Mammogram 1-3 years ago Pap Smear >5 years ago  Allergies Nance Pew, CMA; 03/15/2019 1:47 PM) Penicillins Allergies Reconciled  Medication History Nance Pew, CMA; 03/15/2019 1:48 PM) Simvastatin (10MG  Tablet, Oral) Active. Calcium (500MG  Tablet, Oral) Active. B Complex w/B-12 (Oral) Active. Metoprolol Succinate ER (50MG  Tablet ER 24HR, Oral) Active. Myrbetriq (25MG  Tablet ER 24HR, Oral) Active. Medications Reconciled  Social History Mammie Lorenzo, LPN; 579FGE 579FGE PM) Alcohol use Occasional alcohol use. Caffeine use Coffee. Illicit drug use Prefer to discuss with provider. Tobacco use Former smoker.  Family History Mammie Lorenzo, LPN; 579FGE 579FGE PM) Arthritis Mother. Breast Cancer Sister. Heart Disease Father. Heart disease in female family member before age 89 Hypertension Mother. Rectal Cancer Mother.  Pregnancy / Birth History Mammie Lorenzo, LPN; 579FGE 579FGE PM) Age of menopause <47 Contraceptive History  Intrauterine device, Oral contraceptives. Gravida 1 Maternal age 29-20 Para 1  Other Problems Mammie Lorenzo, LPN; 579FGE 579FGE PM) Arthritis Atrial Fibrillation Back Pain Hemorrhoids Hypercholesterolemia Migraine Headache     Review of Systems  General Not Present- Appetite Loss, Chills, Fatigue, Fever, Night Sweats, Weight Gain and Weight Loss. Skin Not Present- Change in Wart/Mole, Dryness, Hives, Jaundice, New Lesions, Non-Healing Wounds, Rash and Ulcer. HEENT Not Present- Earache, Hearing Loss, Hoarseness, Nose Bleed, Oral Ulcers, Ringing in the Ears, Seasonal Allergies, Sinus Pain, Sore Throat, Visual Disturbances, Wears glasses/contact lenses and Yellow Eyes. Respiratory Not Present- Bloody sputum, Chronic Cough, Difficulty Breathing, Snoring and Wheezing. Breast Not Present- Breast Mass, Breast Pain, Nipple Discharge and Skin Changes. Cardiovascular Not Present- Chest Pain, Difficulty Breathing Lying Down, Leg Cramps, Palpitations, Rapid Heart Rate, Shortness of Breath and Swelling of Extremities. Gastrointestinal Present- Hemorrhoids. Not Present- Abdominal Pain, Bloating, Bloody Stool, Change in Bowel Habits, Chronic diarrhea, Constipation, Difficulty Swallowing, Excessive gas, Gets full quickly at meals, Indigestion, Nausea, Rectal Pain and Vomiting. Female Genitourinary Not Present- Frequency, Nocturia, Painful Urination, Pelvic Pain and Urgency. Musculoskeletal Not Present- Back Pain, Joint Pain, Joint Stiffness, Muscle Pain, Muscle Weakness and Swelling of Extremities. Neurological Not Present- Decreased Memory, Fainting, Headaches, Numbness, Seizures, Tingling, Tremor, Trouble walking and Weakness. Psychiatric Not Present- Anxiety, Bipolar, Change in Sleep Pattern, Depression, Fearful and Frequent crying. Endocrine Not Present- Cold Intolerance, Excessive Hunger, Hair Changes, Heat Intolerance, Hot flashes and New Diabetes. Hematology Not Present- Blood  Thinners, Easy Bruising, Excessive bleeding, Gland problems, HIV and Persistent Infections.  BP 120/88   Pulse 71   Temp 97.6 F (36.4 C) (Oral)   Resp 14   Ht 5\' 2"  (1.575 m)   Wt 57.3 kg   SpO2 100%   BMI 23.12 kg/m     Physical Exam   General Mental Status-Alert. General  Appearance-Not in acute distress. Build & Nutrition-Well nourished. Posture-Normal posture. Gait-Normal.  Head and Neck Head-normocephalic, atraumatic with no lesions or palpable masses. Trachea-midline.  Chest and Lung Exam Chest and lung exam reveals -on auscultation, normal breath sounds, no adventitious sounds and normal vocal resonance.  Cardiovascular Cardiovascular examination reveals -normal heart sounds, regular rate and rhythm with no murmurs and no digital clubbing, cyanosis, edema, increased warmth or tenderness.  Abdomen Inspection Inspection of the abdomen reveals - No Hernias. Palpation/Percussion Palpation and Percussion of the abdomen reveal - Soft, Non Tender, No Rigidity (guarding), No hepatosplenomegaly and No Palpable abdominal masses.  Rectal Anorectal Exam External - Note: No anal gape, good resting tone, moderately weak squeeze pressure.  Neurologic Neurologic evaluation reveals -alert and oriented x 3 with no impairment of recent or remote memory, normal attention span and ability to concentrate, normal sensation and normal coordination.  Musculoskeletal Normal Exam - Bilateral-Upper Extremity Strength Normal and Lower Extremity Strength Normal.   ANOSCOPY, DIAGNOSTIC [id number]) [ Hemorrhoids ] Procedure Other: Procedure: Anoscopy....Marland KitchenMarland KitchenSurgeon: Marcello Moores....Marland KitchenMarland KitchenAfter the risks and benefits were explained, verbal consent was obtained for above procedure. A medical assistant chaperone was present thoroughout the entire procedure. ....Marland KitchenMarland KitchenAnesthesia: none....Marland KitchenMarland KitchenDiagnosis: fecal incontinence....Marland KitchenMarland KitchenFindings: Mobile cyst, elevated in distal  left anterior lateral rectum. I am unable to visualize this with anoscope. Grade 2 hemorrhoids without obvious inflammation.  Performed: 03/15/2019 2:11 PM    Assessment & Plan  RECTAL CYST (K62.89) Impression: Patient underwent anoscopic exam and digital rectal exam today. There was a mobile cyst noted in her right anterior lateral distal rectum approximately 8 cm from anal verge. I could not visually evaluate this with anoscope. I recommended flexible sigmoidoscopy with possible transanal excision.

## 2019-05-19 NOTE — Anesthesia Postprocedure Evaluation (Signed)
Anesthesia Post Note  Patient: Joanne Hansen  Procedure(s) Performed: FLEXIBLE SIGMOIDOSCOPY,  TRANSANAL EXCISION inclusion cyst (N/A Rectum)     Patient location during evaluation: PACU Anesthesia Type: MAC Level of consciousness: awake and alert Pain management: pain level controlled Vital Signs Assessment: post-procedure vital signs reviewed and stable Respiratory status: spontaneous breathing, nonlabored ventilation, respiratory function stable and patient connected to nasal cannula oxygen Cardiovascular status: stable and blood pressure returned to baseline Postop Assessment: no apparent nausea or vomiting Anesthetic complications: no    Last Vitals:  Vitals:   05/18/19 0915 05/18/19 1000  BP:  127/81  Pulse:  65  Resp: 16 14  Temp:  36.6 C  SpO2:  100%    Last Pain:  Vitals:   05/18/19 1000  TempSrc:   PainSc: 0-No pain                 Landis Dowdy

## 2019-05-21 ENCOUNTER — Encounter (HOSPITAL_BASED_OUTPATIENT_CLINIC_OR_DEPARTMENT_OTHER): Payer: Self-pay | Admitting: General Surgery

## 2019-05-21 LAB — SURGICAL PATHOLOGY

## 2019-05-22 ENCOUNTER — Other Ambulatory Visit: Payer: Medicare Other

## 2019-05-23 ENCOUNTER — Ambulatory Visit: Payer: Medicare Other

## 2019-05-24 ENCOUNTER — Other Ambulatory Visit: Payer: Self-pay

## 2019-05-24 ENCOUNTER — Inpatient Hospital Stay: Payer: Medicare Other | Attending: Oncology

## 2019-05-24 ENCOUNTER — Encounter: Payer: Self-pay | Admitting: Oncology

## 2019-05-24 DIAGNOSIS — R531 Weakness: Secondary | ICD-10-CM | POA: Insufficient documentation

## 2019-05-24 DIAGNOSIS — Z5189 Encounter for other specified aftercare: Secondary | ICD-10-CM | POA: Insufficient documentation

## 2019-05-24 DIAGNOSIS — R42 Dizziness and giddiness: Secondary | ICD-10-CM | POA: Insufficient documentation

## 2019-05-24 DIAGNOSIS — Z171 Estrogen receptor negative status [ER-]: Secondary | ICD-10-CM | POA: Insufficient documentation

## 2019-05-24 DIAGNOSIS — C50211 Malignant neoplasm of upper-inner quadrant of right female breast: Secondary | ICD-10-CM | POA: Insufficient documentation

## 2019-05-24 DIAGNOSIS — Z5111 Encounter for antineoplastic chemotherapy: Secondary | ICD-10-CM | POA: Insufficient documentation

## 2019-05-24 NOTE — Progress Notes (Signed)
Met with patient at registration to introduce myself as Arboriculturist and to offer available resources.  Patient has 2 insurances therefore copay assistance is not needed.  Discussed one-time $1000 Radio broadcast assistant to assist with personal expenses while going through treatment.    Gave her my card if interested in applying and for any additional financial questions or concerns.

## 2019-05-25 ENCOUNTER — Encounter: Payer: Self-pay | Admitting: Oncology

## 2019-05-25 NOTE — Progress Notes (Signed)
Patient called inquiring about grant we spoke about on yesterday.  Advised patient what to bring on 05/29/19 to apply for grant. She verbalized understanding and has my card for any additional financial questions or concerns.

## 2019-05-28 ENCOUNTER — Other Ambulatory Visit: Payer: Self-pay | Admitting: *Deleted

## 2019-05-28 DIAGNOSIS — C50211 Malignant neoplasm of upper-inner quadrant of right female breast: Secondary | ICD-10-CM

## 2019-05-28 MED ORDER — ONDANSETRON HCL 8 MG PO TABS: 8.0000 mg | ORAL_TABLET | Freq: Two times a day (BID) | ORAL | 1 refills | Status: DC | PRN

## 2019-05-28 NOTE — Progress Notes (Signed)
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 She underwent unilateral right diagnostic mammography with tomography and right breast ultrasonography at [facility] on 04/12/2019 showing: Breast Density Category B. Spot compression views of the medial right breast, including tomography confirm an irregular mass spanning approximately 0.9 cm. On physical exam, no mass is palpated in the upper inner quadrant of the right breast. Targeted ultrasound is performed, showing a single irregular mass versus two immediately adjacent small irregular masses, that in total measure 1.1 cm. Individually, the two irregular masses measure 0.8 x 0.4 x 0.5 cm and 0.4 x 0.4 x 0.4 cm, respectively. Mass(es) are at 1 o'clock position 3 cm from the nipple. Ultrasound of the right axilla is negative for lymphadenopathy.  Accordingly on 04/16/2019 she proceeded to biopsy of the right breast area in question. The pathology from this procedure showed ([id number]): invasive ductal carcinoma, grade II. Prognostic indicators significant for: estrogen receptor, 0% negative and progesterone receptor, 0% negative,. Proliferation marker Ki67 at 20%. HER2 negative (1+) by  immunohistochemistry.  The patient's subsequent history is as detailed below.   INTERVAL HISTORY: Joanne Hansen returns today for follow-up and treatment of her  triple negative invasive ductal carcinoma. She was last seen here on 05/07/2019.   Since her last visit here, she underwent genetic testing on 05/07/2019 through the [facility] Breast Cancer STAT Panel + Common Hereditary cancer panel found no pathogenic mutations. The STAT Breast cancer panel offered by [facility] includes sequencing and rearrangement analysis for the following 9 genes:  ATM, BRCA1, BRCA2, CDH1, CHEK2, PALB2, PTEN, STK11 and TP53.  The Common Hereditary Cancers Panel offered by [facility] includes sequencing and/or deletion duplication testing of the following 47 genes: APC, ATM, AXIN2, BARD1, BMPR1A, BRCA1, BRCA2, BRIP1, CDH1, CDKN2A (p14ARF), CDKN2A (p16INK4a), CKD4, CHEK2, CTNNA1, DICER1, EPCAM (Deletion/duplication testing only), GREM1 (promoter region deletion/duplication testing only), KIT, MEN1, MLH1, MSH2, MSH3, MSH6, MUTYH, NBN, NF1, NHTL1, PALB2, PDGFRA, PMS2, POLD1, POLE, PTEN, RAD50, RAD51C, RAD51D, SDHB, SDHC, SDHD, SMAD4, SMARCA4. STK11, TP53, TSC1, TSC2, and VHL.  The following genes were evaluated for sequence changes only: SDHA and HOXB13 c.251G>A variant only.  She underwent a right breast lumpectomy on 05/15/2019. The pathology from this procedure showed ([id number]): A. Breast, right lumpectomy: Invasive ductal carcinoma, grade III/III, spanning 1.0 cm.  Ductal carcinoma in situ, high-grade.  Invasive ductal carcinoma is focally 0.1 cm to the superior/anterior junctional margin of specimen 1.  See oncology table below. B. Breast, right additional superior margin, excision: Benign breast parenchyma.  There is no evidence of malignancy.  C. Breast, right additional lateral margin, excision: Benign breast parenchyma.  There is no evidence of malignancy.  D. Breast, right additional medial margin: Benign breast  parenchyma.  There is no evidence of malignancy.  E. Lymph node, right axillary, sentinel biopsy: There is no evidence of carcinoma in 1 of 1 lymph  node (0/1).  F. Lymph node, right axillary, sentinel biopsy:   There is no evidence of carcinoma in 1 of 1 lymph node (0/1).   She also underwent a flexible sigmoidoscopy on 05/18/2019. The pathology from this procedure showed ([id number]): A. Rectum, cyst, excision: - Benign polypoid rectal tissue containing degenerative/necrotic material in the submucosa with associated foreign body giant cell reaction    - No evidence of malignancy  REVIEW OF SYSTEMS: Joanne Hansen tells me her rectal surgery was "life changing".  She still has a little bit of discomfort, is being very careful not to have hard bowel movements but also not to have diarrhea, but she has no pain and she is working with Dr. Marcello Moores regarding that.  She did very well with her breast surgery.  She has a little bit of discomfort in the armpit particularly.  She has had no bleeding fever or unusual pain.  She is still a bit troubled by the port, which makes her aware of her neck, but this is not unusual.  Overall she is very much ready to start chemotherapy.    PAST MEDICAL HISTORY: Past Medical History:  Diagnosis Date   Arthritis    Family history of brain cancer    Family history of breast cancer    Family history of colon cancer    Fecal incontinence    05-16-2019 per pt only a little leakage but controllable (was in clinical trial for [facility] without benefit.)   Frequent headaches    History of syncope    pre-syncope  08/ 2015  per pt no issue with this since    Hyperlipidemia    Malignant neoplasm of upper-inner quadrant of right breast in female, estrogen receptor negative [facility]) oncologist-- dr Jana Hakim  dr moody   dx 08/ 2020--  invasive ductal carcinoma,  05-13-2019  s/p right breast lumpectomy    PAF (paroxysmal atrial fibrillation) ([place]) (05-16-2019   pt from  [place], recently moved to [place] to be near Hansen, has not established a cardiology yet--  previously seen by dr Lowella Dandy cossu (last office note dated 02-28-2018 scanned in epic)---  echo 04-15-2014  ef 55-60%, G1DD mild AR without stenosis, RVSp 21mHg   Rectal cyst    Thyroid goiter    pt ultrasound in epic 06-04-2014 , nodules, no bx done   Urinary incontinence, mixed      PAST SURGICAL HISTORY: Past Surgical History:  Procedure Laterality Date   ABDOMINAL HYSTERECTOMY  1978   w/  RSO  and APPENDECTOMY   BREAST LUMPECTOMY WITH RADIOACTIVE SEED AND SENTINEL LYMPH NODE BIOPSY Right 05/15/2019   Procedure: RIGHT BREAST LUM[place]OMY WITH [place]IOACTIVE SEED AND [place]ENTINEL [place]  Surgeon: CErroll Luna MD;  [facility]  Service: General;  Laterality: Right;   BREAST SURGERY  2011   biospy of right breast   CATARACT EXTRACTION W/ INTRAOCULAR LENS  IMPLANT, BILATERAL  2004   FLEXIBLE SIGMOIDOSCOPY N/A 05/18/2019   Procedure: FLEXIBLE SIGMOIDOSCOPY,  TRANSANAL EXCISION inclusion cyst;  Surgeon: TLeighton Ruff MD;  [place] [facility]  Service: General;  Laterality: N/A;   GLUTEUS MINIMUS REPAIR  2017   LUMBAR LAMINECTOMY  2013   L5 -- S1   PORTACATH PLACEMENT Right 05/15/2019   Procedure: INSERTION PORT-A-CATH WITH ULTRASOUND;  Surgeon: CErroll Luna MD;  [facility]  Service: General;  Laterality: Right;   ROTATOR CUFF REPAIR Left 2001     FAMILY HISTORY: Family History  Problem Relation  Age of Onset   COPD Mother    Cancer Mother 82       colon   Arthritis Mother    Hypertension Mother    26 / [place] Mother    Stroke Mother 26   Alzheimer's disease Mother 57   Heart disease Father    Heart attack Father 71   Lung disease Father    Arthritis Sister    Breast cancer Sister 33       Lumpectomy   Diabetes Maternal Grandmother    Breast cancer Niece 3        Joanne Hansen   Cervical cancer Niece 87   The patient's father died at age 75 from a heart attack.  The patient's mother died at age 42; she had a gastrointestinal cancer diagnosed age 46.  The patient has 1 sister with breast cancer and one niece with breast cancer diagnosed at age 58.   GYNECOLOGIC HISTORY:  No LMP recorded. Patient has had a hysterectomy. 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  Her husband Germain Osgood (goes by "Ronald Pippins") is a retired Chief Financial Officer.  Their Hansen Joanne Hansen (currently divorced) lives in [place].  The patient has no grandchildren.  She is not a church attender   ADVANCED DIRECTIVES: The patient's husband is her healthcare power of attorney   HEALTH MAINTENANCE: Social History   Tobacco Use   Smoking status: Never Smoker   Smokeless tobacco: Never Used  Substance Use Topics   Alcohol use: Yes   Drug use: Yes    Types: Amphetamines    Colonoscopy: Due 2022  PAP: Status post hysterectomy  Bone density: Pending   Allergies  Allergen Reactions   Penicillins Swelling   Sulfa Antibiotics Nausea And Vomiting    Current Outpatient Medications  Medication Sig Dispense Refill   Calcium-Phosphorus-Vitamin D (CITRACAL CALCIUM GUMMIES PO) Take 2 each by mouth daily.     Cyanocobalamin (VITAMIN B 12 PO) Take 1,000 mg by mouth daily. sublingal     dexamethasone (DECADRON) 4 MG tablet Take 2 tablets (8 mg total) by mouth 2 (two) times daily. Start the day before Taxotere. Then again the day after chemo for 3 days. 30 tablet 1   lidocaine-prilocaine (EMLA) cream Apply to affected area once 30 g 3   loratadine (CLARITIN) 10 MG tablet Take 1 tablet (10 mg total) by mouth daily. 90 tablet 0    LORazepam (ATIVAN) 0.5 MG tablet Take 1 tablet (0.5 mg total) by mouth at bedtime as needed (Nausea or vomiting). 30 tablet 0   methylcellulose oral powder Take by mouth every evening.      metoprolol tartrate (LOPRESSOR) 25 MG tablet TAKE 1 2 (ONE HALF) TABLET BY MOUTH TWICE DAILY (Patient taking differently: Take 12.5 mg by mouth 2 (two) times daily. TAKE 1/ 2 (ONE HALF) TABLET BY MOUTH TWICE DAILY) 90 tablet 1   ondansetron (ZOFRAN) 8 MG tablet Take 1 tablet (8 mg total) by mouth 2 (two) times daily as needed for refractory nausea / vomiting. Start on day 3 after chemo. 30 tablet 1   oxyCODONE (OXY IR/ROXICODONE) 5 MG immediate release tablet Take 1 tablet (5 mg total) by mouth every 6 (six) hours as needed for severe pain. 15 tablet 0   prochlorperazine (  COMPAZINE) 10 MG tablet Take 1 tablet (10 mg total) by mouth every 6 (six) hours as needed (Nausea or vomiting). 30 tablet 1   simvastatin (ZOCOR) 40 MG tablet Take 1 tablet (40 mg total) by mouth daily. (Patient taking differently: Take 40 mg by mouth at bedtime. ) 90 tablet 1   No current facility-administered medications for this visit.      OBJECTIVE: Middle-aged white woman in no acute distress  Vitals:   05/29/19 0825  BP: (!) 142/90  Pulse: 79  Resp: 18  Temp: 98.2 F (36.8 C)  SpO2: 99%   Wt Readings from Last 3 Encounters:  05/29/19 128 lb 1.6 oz (58.1 kg)  05/18/19 126 lb 6.4 oz (57.3 kg)  05/15/19 127 lb 6.8 oz (57.8 kg)   Body mass index is 23.43 kg/m.    ECOG FS:1 - Symptomatic but completely ambulatory  Ocular: Sclerae unicteric, pupils round and equal Ear-nose-throat: Wearing a mask Lymphatic: No cervical or supraclavicular adenopathy Lungs no rales or rhonchi Heart regular rate and rhythm Abd soft, nontender, positive bowel sounds MSK no focal spinal tenderness, no joint edema Neuro: non-focal, well-oriented, appropriate affect Breasts: The right breast is status post recent lumpectomy.  The incision  is healing very nicely.  The cosmetic result is excellent.  There is no dehiscence erythema or swelling.  Left breast is benign.  Both axillae are benign.   LAB RESULTS:  CMP     Component Value Date/Time   NA 140 05/07/2019 1547   K 4.1 05/07/2019 1547   CL 105 05/07/2019 1547   CO2 28 05/07/2019 1547   GLUCOSE 92 05/07/2019 1547   BUN 20 05/07/2019 1547   CREATININE 0.77 05/07/2019 1547   CALCIUM 9.1 05/07/2019 1547   PROT 6.8 05/07/2019 1547   ALBUMIN 4.2 05/07/2019 1547   AST 19 05/07/2019 1547   ALT 13 05/07/2019 1547   ALKPHOS 71 05/07/2019 1547   BILITOT 0.5 05/07/2019 1547   GFRNONAA >60 05/07/2019 1547   GFRAA >60 05/07/2019 1547    No results found for: TOTALPROTELP, ALBUMINELP, A1GS, A2GS, BETS, BETA2SER, GAMS, MSPIKE, SPEI  No results found for: KPAFRELGTCHN, LAMBDASER, KAPLAMBRATIO  Lab Results  Component Value Date   WBC 6.7 05/07/2019   NEUTROABS 4.2 05/07/2019   HGB 13.1 05/07/2019   HCT 39.8 05/07/2019   MCV 99.5 05/07/2019   PLT 240 05/07/2019    No results found for: LABCA2  No components found for: [id number]  No results for input(s): INR in the last 168 hours.  No results found for: LABCA2  No results found for: ITG549  No results found for: IYM415  No results found for: AXE940  No results found for: CA2729  No components found for: HGQUANT  No results found for: CEA1 / No results found for: CEA1   No results found for: AFPTUMOR  No results found for: CHROMOGRNA  No results found for: PSA1  No visits with results within 3 Day(s) from this visit.  Latest known visit with results is:  Admission on 05/18/2019, Discharged on 05/18/2019  Component Date Value Ref Range Status   SURGICAL PATHOLOGY 05/18/2019    Final-Edited                   Value:SURGICAL PATHOLOGY CASE: [id number] PATIENT: Doylene Bode Surgical Pathology Report     Clinical History: Rectal cyst     DIAGNOSIS:  A. RECTUM, CYST, EXCISION: -  Benign polypoid rectal tissue containing degenerative/necrotic material in the submucosa  with associated foreign body giant cell reaction - No evidence of malignancy    GROSS DESCRIPTION:  The specimen is received in formalin and consists of a 1.1 x 1.0 x 0.7 cm tan-pink, disrupted cystic structure with a small amount of attached tan-pink possible mucosa.  Specimen is bisected, and reveals an abundant amount of tan-yellow grumous material.  A representative section is submitted in 1 cassette.    Final Diagnosis performed by Jaquita Folds, MD.   Electronically signed 05/21/2019 Technical component performed at [facility], [place] [address], [place], [place] [address].  Professional component performed at [employer]. [facility], [place] [address] [place], [place] [address].  Immunohistochemistry Technical component (if applicable) was performed at [facility]. [address] [place], [place], [place] [address].   IMMUNOHISTOCHEMISTRY DISCLAIMER (if applicable): Some of these immunohistochemical stains may have been developed and the performance characteristics determine by [facility]. Some may not have been cleared or approved by the U.S. Food and Drug Administration. The FDA has determined that such clearance or approval is not necessary. This test is used for clinical purposes. It should not be regarded as investigational or for research. This laboratory is certified under the [employer] (CLIA-88) as qualified to perform high complexity clinical laboratory testing.  The controls stained appropriately.     (this displays the last labs from the last 3 days)  No results found for: TOTALPROTELP, ALBUMINELP, A1GS, A2GS, BETS, BETA2SER, GAMS, MSPIKE, SPEI (this displays SPEP labs)  No results found for: KPAFRELGTCHN, LAMBDASER, KAPLAMBRATIO (kappa/lambda  light chains)  No results found for: HGBA, HGBA2QUANT, HGBFQUANT, HGBSQUAN (Hemoglobinopathy evaluation)   No results found for: LDH  No results found for: IRON, TIBC, IRONPCTSAT (Iron and TIBC)  No results found for: FERRITIN  Urinalysis No results found for: COLORURINE, APPEARANCEUR, LABSPEC, PHURINE, GLUCOSEU, HGBUR, BILIRUBINUR, KETONESUR, PROTEINUR, UROBILINOGEN, NITRITE, LEUKOCYTESUR   STUDIES:  Nm Sentinel Node Inj-no Rpt (breast)  Result Date: 05/15/2019 Sulfur colloid was injected by the nuclear medicine technologist for melanoma sentinel node.   Mm Breast Surgical Specimen  Result Date: 05/15/2019 CLINICAL DATA:  Radioactive seed placement of the right breast was performed May 14, 2019 prior to lumpectomy. EXAM: SPECIMEN RADIOGRAPH OF THE RIGHT BREAST COMPARISON:  Previous exam(s). FINDINGS: Status post excision of the right breast. The radioactive seed and biopsy marker clip are present, completely intact, and were marked for pathology. IMPRESSION: Specimen radiograph of the right breast. Electronically Signed   By: Curlene Dolphin M.D.   On: 05/15/2019 13:04   Dg Chest Port 1 View  Result Date: 05/15/2019 CLINICAL DATA:  Port-A-Cath insertion. EXAM: PORTABLE CHEST 1 VIEW COMPARISON:  No recent prior. FINDINGS: Port-A-Cath noted with tip over superior vena cava. Subcutaneous emphysema noted at the insertion site. No pneumothorax identified. Cardiomegaly. No pulmonary venous congestion. Right perihilar atelectatic changes are noted. Densities noted over the right upper chest may be outside the patient. No pleural effusion. IMPRESSION: Port-A-Cath noted with tip over superior vena cava. Subcutaneous emphysema noted at the insertion site. No pneumothorax identified. Electronically Signed   By: Marcello Moores  Register   On: 05/15/2019 14:39   Dg Fluoro Guide Cv Line-no Report  Result Date: 05/15/2019 Fluoroscopy was utilized by the requesting physician.  No radiographic  interpretation.   Mm Rt Radioactive Seed Loc Mammo  Guide  Result Date: 05/14/2019 CLINICAL DATA:  The patient presents for seed localization prior to RIGHT lumpectomy for recently diagnosed grade 3 invasive ductal carcinoma. EXAM: MAMMOGRAPHIC GUIDED RADIOACTIVE SEED LOCALIZATION OF THE RIGHT BREAST COMPARISON:  Previous exam(s). FINDINGS: Patient presents for radioactive seed localization prior to lumpectomy. I met with the patient and we discussed the procedure of seed localization including benefits and alternatives. We discussed the high likelihood of a successful procedure. We discussed the risks of the procedure including infection, bleeding, tissue injury and further surgery. We discussed the low dose of radioactivity involved in the procedure. Informed, written consent was given. The usual time-out protocol was performed immediately prior to the procedure. Using mammographic guidance, sterile technique, 1% lidocaine and an I-125 radioactive seed, the ribbon shaped clip in the MEDIAL portion of the RIGHT breast was localized using a MEDIAL to LATERAL approach. The follow-up mammogram images confirm the seed in the expected location and were marked for Dr. Brantley Stage. Follow-up survey of the patient confirms presence of the radioactive seed. Order number of I-125 seed:  [id number]. Total activity:  3.016 millicuries reference Date: 05/07/2019 The patient tolerated the procedure well and was released from the [place]. She was given instructions regarding seed removal. IMPRESSION: Radioactive seed localization right breast. No apparent complications. Electronically Signed   By: Nolon Nations M.D.   On: 05/14/2019 15:20     ELIGIBLE FOR AVAILABLE RESEARCH PROTOCOL: NO   ASSESSMENT: 73 y.o. [place], [place] woman status post right breast upper quadrant biopsy for an mT1b (or single t1c0 invasive ductal carcinoma, grade 2, triple negative, with an MIB-1-1 of 20%.  (1) Genetic testing on 05/07/2019  through the [facility] Breast Cancer STAT Panel + Common Hereditary cancer panel found no pathogenic mutations. The STAT Breast cancer panel offered by [facility] includes sequencing and rearrangement analysis for the following 9 genes:  ATM, BRCA1, BRCA2, CDH1, CHEK2, PALB2, PTEN, STK11 and TP53.  The Common Hereditary Cancers Panel offered by [facility] includes sequencing and/or deletion duplication testing of the following 47 genes: APC, ATM, AXIN2, BARD1, BMPR1A, BRCA1, BRCA2, BRIP1, CDH1, CDKN2A (p14ARF), CDKN2A (p16INK4a), CKD4, CHEK2, CTNNA1, DICER1, EPCAM (Deletion/duplication testing only), GREM1 (promoter region deletion/duplication testing only), KIT, MEN1, MLH1, MSH2, MSH3, MSH6, MUTYH, NBN, NF1, NHTL1, PALB2, PDGFRA, PMS2, POLD1, POLE, PTEN, RAD50, RAD51C, RAD51D, SDHB, SDHC, SDHD, SMAD4, SMARCA4. STK11, TP53, TSC1, TSC2, and VHL.  The following genes were evaluated for sequence changes only: SDHA and HOXB13 c.251G>A variant only.  (2) s/p right lumpectomy and sentinel lymph node sampling 05/15/2019 for a pT1b pN0 stage IB invasive ductal carcinoma, grade 3, with negative margins.  (a) a total of 2 sentinel lymph nodes were removed  (3) adjuvant chemotherapy to consist of cyclophosphamide and docetaxel every 21 days x 4, first dose May 29, 2019  (4) adjuvant radiation to follow  (5) consider prophylactic antiestrogens   PLAN: Latifah did well with her surgeries and she is ready to start chemotherapy today.  She greatly benefited from and appreciated meeting with our chemotherapy teaching nurse.  She took her steroids yesterday.  She already has a wig in place for when she loses her hair.  She tells me she has an excellent support in her family and friends.  She is eager to go.  I have reviewed side effects with her again and again gave her a routing sheet on how to take her supportive medications.  She particularly understands that she is to take her Decadron and Compazine as ordered and  not  as needed.  I have asked her to call us with any little thing and not wait until she has a big problem to contact us.  She will see [place] again in a week.  She will bring a symptom diary for [place] to review.  At that time we will decide whether she needs antibiotics for her nadir counts and also whether we need to see her on day 8 after each of the next 3 cycles  She knows to call for any other issue that may develop before the next visit.  Kerolos Nehme, Virgie Dad, MD  05/29/19 8:40 AM Medical Oncology and Hematology [facility] [address] [place], [place] [address] Tel. [phone]    Fax. [phone]   I, Jacqualyn Posey am acting as a Education administrator for Chauncey Cruel, MD.   I, Lurline Del MD, have reviewed the above documentation for accuracy and completeness, and I agree with the above.

## 2019-05-29 ENCOUNTER — Inpatient Hospital Stay: Payer: Medicare Other

## 2019-05-29 ENCOUNTER — Inpatient Hospital Stay (HOSPITAL_BASED_OUTPATIENT_CLINIC_OR_DEPARTMENT_OTHER): Payer: Medicare Other | Admitting: Oncology

## 2019-05-29 ENCOUNTER — Telehealth: Payer: Self-pay | Admitting: *Deleted

## 2019-05-29 ENCOUNTER — Other Ambulatory Visit: Payer: Self-pay

## 2019-05-29 ENCOUNTER — Other Ambulatory Visit: Payer: Self-pay | Admitting: *Deleted

## 2019-05-29 ENCOUNTER — Encounter: Payer: Self-pay | Admitting: Oncology

## 2019-05-29 VITALS — BP 142/90 | HR 79 | Temp 98.2°F | Resp 18 | Ht 62.0 in | Wt 128.1 lb

## 2019-05-29 VITALS — BP 134/78 | HR 70 | Temp 98.2°F | Resp 16

## 2019-05-29 DIAGNOSIS — C50211 Malignant neoplasm of upper-inner quadrant of right female breast: Secondary | ICD-10-CM

## 2019-05-29 DIAGNOSIS — Z5111 Encounter for antineoplastic chemotherapy: Secondary | ICD-10-CM | POA: Diagnosis present

## 2019-05-29 DIAGNOSIS — Z5189 Encounter for other specified aftercare: Secondary | ICD-10-CM | POA: Diagnosis not present

## 2019-05-29 DIAGNOSIS — R42 Dizziness and giddiness: Secondary | ICD-10-CM | POA: Diagnosis not present

## 2019-05-29 DIAGNOSIS — Z171 Estrogen receptor negative status [ER-]: Secondary | ICD-10-CM

## 2019-05-29 DIAGNOSIS — R531 Weakness: Secondary | ICD-10-CM | POA: Diagnosis not present

## 2019-05-29 LAB — COMPREHENSIVE METABOLIC PANEL
ALT: 13 U/L (ref 0–44)
AST: 14 U/L — ABNORMAL LOW (ref 15–41)
Albumin: 3.8 g/dL (ref 3.5–5.0)
Alkaline Phosphatase: 77 U/L (ref 38–126)
Anion gap: 9 (ref 5–15)
BUN: 20 mg/dL (ref 8–23)
CO2: 22 mmol/L (ref 22–32)
Calcium: 9.4 mg/dL (ref 8.9–10.3)
Chloride: 108 mmol/L (ref 98–111)
Creatinine, Ser: 0.73 mg/dL (ref 0.44–1.00)
GFR calc Af Amer: >60 mL/min (ref >60)
GFR calc non Af Amer: >60 mL/min (ref >60)
Glucose, Bld: 135 mg/dL — ABNORMAL HIGH (ref 70–99)
Potassium: 3.7 mmol/L (ref 3.5–5.1)
Sodium: 139 mmol/L (ref 135–145)
Total Bilirubin: 0.3 mg/dL (ref 0.3–1.2)
Total Protein: 6.9 g/dL (ref 6.5–8.1)

## 2019-05-29 LAB — CBC WITH DIFFERENTIAL/PLATELET
Abs Immature Granulocytes: 0.11 10*3/uL — ABNORMAL HIGH (ref 0.00–0.07)
Basophils Absolute: 0 10*3/uL (ref 0.0–0.1)
Basophils Relative: 0 %
Eosinophils Absolute: 0 10*3/uL (ref 0.0–0.5)
Eosinophils Relative: 0 %
HCT: 38.2 % (ref 36.0–46.0)
Hemoglobin: 13 g/dL (ref 12.0–15.0)
Immature Granulocytes: 1 %
Lymphocytes Relative: 6 %
Lymphs Abs: 0.8 10*3/uL (ref 0.7–4.0)
MCH: 32.5 pg (ref 26.0–34.0)
MCHC: 34 g/dL (ref 30.0–36.0)
MCV: 95.5 fL (ref 80.0–100.0)
Monocytes Absolute: 0.8 10*3/uL (ref 0.1–1.0)
Monocytes Relative: 6 %
Neutro Abs: 11.6 10*3/uL — ABNORMAL HIGH (ref 1.7–7.7)
Neutrophils Relative %: 87 %
Platelets: 262 10*3/uL (ref 150–400)
RBC: 4 MIL/uL (ref 3.87–5.11)
RDW: 11.9 % (ref 11.5–15.5)
WBC: 13.3 10*3/uL — ABNORMAL HIGH (ref 4.0–10.5)
nRBC: 0 % (ref 0.0–0.2)

## 2019-05-29 MED ORDER — PEGFILGRASTIM 6 MG/0.6ML ~~LOC~~ PSKT
6.0000 mg | PREFILLED_SYRINGE | Freq: Once | SUBCUTANEOUS | Status: AC
  Administered 2019-05-29: 14:00:00 6 mg via SUBCUTANEOUS

## 2019-05-29 MED ORDER — SODIUM CHLORIDE 0.9 % IV SOLN
600.0000 mg/m2 | Freq: Once | INTRAVENOUS | Status: AC
  Administered 2019-05-29: 960 mg via INTRAVENOUS
  Filled 2019-05-29: qty 48

## 2019-05-29 MED ORDER — DEXAMETHASONE SODIUM PHOSPHATE 10 MG/ML IJ SOLN
10.0000 mg | Freq: Once | INTRAMUSCULAR | Status: AC
  Administered 2019-05-29: 11:00:00 10 mg via INTRAVENOUS

## 2019-05-29 MED ORDER — SODIUM CHLORIDE 0.9% FLUSH
10.0000 mL | INTRAVENOUS | Status: DC | PRN
  Administered 2019-05-29: 14:00:00 10 mL
  Filled 2019-05-29: qty 10

## 2019-05-29 MED ORDER — SODIUM CHLORIDE 0.9 % IV SOLN
75.0000 mg/m2 | Freq: Once | INTRAVENOUS | Status: AC
  Administered 2019-05-29: 11:00:00 120 mg via INTRAVENOUS
  Filled 2019-05-29: qty 12

## 2019-05-29 MED ORDER — HEPARIN SOD (PORK) LOCK FLUSH 100 UNIT/ML IV SOLN
500.0000 [IU] | Freq: Once | INTRAVENOUS | Status: AC | PRN
  Administered 2019-05-29: 14:00:00 500 [IU]
  Filled 2019-05-29: qty 5

## 2019-05-29 MED ORDER — PALONOSETRON HCL INJECTION 0.25 MG/5ML
0.2500 mg | Freq: Once | INTRAVENOUS | Status: AC
  Administered 2019-05-29: 0.25 mg via INTRAVENOUS

## 2019-05-29 MED ORDER — SODIUM CHLORIDE 0.9 % IV SOLN: 10.0000 mg | Freq: Once | INTRAVENOUS | Status: DC

## 2019-05-29 MED ORDER — PEGFILGRASTIM 6 MG/0.6ML ~~LOC~~ PSKT
PREFILLED_SYRINGE | SUBCUTANEOUS | Status: AC
  Filled 2019-05-29: qty 0.6

## 2019-05-29 MED ORDER — DEXAMETHASONE SODIUM PHOSPHATE 10 MG/ML IJ SOLN
INTRAMUSCULAR | Status: AC
  Filled 2019-05-29: qty 1

## 2019-05-29 MED ORDER — SODIUM CHLORIDE 0.9 % IV SOLN
Freq: Once | INTRAVENOUS | Status: AC
  Administered 2019-05-29: 10:00:00 via INTRAVENOUS
  Filled 2019-05-29: qty 250

## 2019-05-29 MED ORDER — PALONOSETRON HCL INJECTION 0.25 MG/5ML
INTRAVENOUS | Status: AC
  Filled 2019-05-29: qty 5

## 2019-05-29 NOTE — Patient Instructions (Signed)
[facility] Discharge Instructions for Patients Receiving Chemotherapy  Today you received the following chemotherapy agents Taxotere and Cytoxan  To help prevent nausea and vomiting after your treatment, we encourage you to take your nausea medication as prescribed.  If you develop nausea and vomiting that is not controlled by your nausea medication, call the clinic.   BELOW ARE SYMPTOMS THAT SHOULD BE REPORTED IMMEDIATELY:  *FEVER GREATER THAN 100.5 F  *CHILLS WITH OR WITHOUT FEVER  NAUSEA AND VOMITING THAT IS NOT CONTROLLED WITH YOUR NAUSEA MEDICATION  *UNUSUAL SHORTNESS OF BREATH  *UNUSUAL BRUISING OR BLEEDING  TENDERNESS IN MOUTH AND THROAT WITH OR WITHOUT PRESENCE OF ULCERS  *URINARY PROBLEMS  *BOWEL PROBLEMS  UNUSUAL RASH Items with * indicate a potential emergency and should be followed up as soon as possible.  Feel free to call the clinic should you have any questions or concerns. The clinic phone number is [phone].  Please show the [place] at check-in to the Emergency Department and triage nurse.  Docetaxel injection(taxotere) What is this medicine? DOCETAXEL (doe se TAX el) is a chemotherapy drug. It targets fast dividing cells, like cancer cells, and causes these cells to die. This medicine is used to treat many types of cancers like breast cancer, certain stomach cancers, head and neck cancer, lung cancer, and prostate cancer. This medicine may be used for other purposes; ask your health care provider or pharmacist if you have questions. COMMON BRAND NAME(S): Docefrez, Taxotere What should I tell my health care provider before I take this medicine? They need to know if you have any of these conditions:  infection (especially a virus infection such as chickenpox, cold sores, or herpes)  liver disease  low blood counts, like low white cell, platelet, or red cell counts  an unusual or allergic reaction to docetaxel, polysorbate  80, other chemotherapy agents, other medicines, foods, dyes, or preservatives  pregnant or trying to get pregnant  breast-feeding How should I use this medicine? This drug is given as an infusion into a vein. It is administered in a hospital or clinic by a specially trained health care professional. Talk to your pediatrician regarding the use of this medicine in children. Special care may be needed. Overdosage: If you think you have taken too much of this medicine contact a poison control center or emergency room at once. NOTE: This medicine is only for you. Do not share this medicine with others. What if I miss a dose? It is important not to miss your dose. Call your doctor or health care professional if you are unable to keep an appointment. What may interact with this medicine?  aprepitant  certain antibiotics like erythromycin or clarithromycin  certain antivirals for HIV or hepatitis  certain medicines for fungal infections like fluconazole, itraconazole, ketoconazole, posaconazole, or voriconazole  cimetidine  ciprofloxacin  conivaptan  cyclosporine  dronedarone  fluvoxamine  grapefruit juice  imatinib  verapamil This list may not describe all possible interactions. Give your health care provider a list of all the medicines, herbs, non-prescription drugs, or dietary supplements you use. Also tell them if you smoke, drink alcohol, or use illegal drugs. Some items may interact with your medicine. What should I watch for while using this medicine? Your condition will be monitored carefully while you are receiving this medicine. You will need important blood work done while you are taking this medicine. Call your doctor or health care professional for advice if you get a fever, chills  or sore throat, or other symptoms of a cold or flu. Do not treat yourself. This drug decreases your body's ability to fight infections. Try to avoid being around people who are sick. Some  products may contain alcohol. Ask your health care professional if this medicine contains alcohol. Be sure to tell all health care professionals you are taking this medicine. Certain medicines, like metronidazole and disulfiram, can cause an unpleasant reaction when taken with alcohol. The reaction includes flushing, headache, nausea, vomiting, sweating, and increased thirst. The reaction can last from 30 minutes to several hours. You may get drowsy or dizzy. Do not drive, use machinery, or do anything that needs mental alertness until you know how this medicine affects you. Do not stand or sit up quickly, especially if you are an older patient. This reduces the risk of dizzy or fainting spells. Alcohol may interfere with the effect of this medicine. Talk to your health care professional about your risk of cancer. You may be more at risk for certain types of cancer if you take this medicine. Do not become pregnant while taking this medicine or for 6 months after stopping it. Women should inform their doctor if they wish to become pregnant or think they might be pregnant. There is a potential for serious side effects to an unborn child. Talk to your health care professional or pharmacist for more information. Do not breast-feed an infant while taking this medicine or for 1 to 2 weeks after stopping it. Males who get this medicine must use a condom during sex with females who can get pregnant. If you get a woman pregnant, the baby could have birth defects. The baby could die before they are born. You will need to continue wearing a condom for 3 months after stopping the medicine. Tell your health care provider right away if your partner becomes pregnant while you are taking this medicine. This may interfere with the ability to father a child. You should talk to your doctor or health care professional if you are concerned about your fertility. What side effects may I notice from receiving this medicine? Side  effects that you should report to your doctor or health care professional as soon as possible:  allergic reactions like skin rash, itching or hives, swelling of the face, lips, or tongue  blurred vision  breathing problems  changes in vision  low blood counts - This drug may decrease the number of white blood cells, red blood cells and platelets. You may be at increased risk for infections and bleeding.  nausea and vomiting  pain, redness or irritation at site where injected  pain, tingling, numbness in the hands or feet  redness, blistering, peeling, or loosening of the skin, including inside the mouth  signs of decreased platelets or bleeding - bruising, pinpoint red spots on the skin, black, tarry stools, nosebleeds  signs of decreased red blood cells - unusually weak or tired, fainting spells, lightheadedness  signs of infection - fever or chills, cough, sore throat, pain or difficulty passing urine  swelling of the ankle, feet, hands Side effects that usually do not require medical attention (report to your doctor or health care professional if they continue or are bothersome):  constipation  diarrhea  fingernail or toenail changes  hair loss  loss of appetite  mouth sores  muscle pain This list may not describe all possible side effects. Call your doctor for medical advice about side effects. You may report side effects to FDA at  1-800-FDA-1088. Where should I keep my medicine? This drug is given in a hospital or clinic and will not be stored at home. NOTE: This sheet is a summary. It may not cover all possible information. If you have questions about this medicine, talk to your doctor, pharmacist, or health care provider.  2020 Elsevier/Gold Standard (2018-10-13 12:23:11)   Cyclophosphamide injection(cytoxan) What is this medicine? CYCLOPHOSPHAMIDE (sye kloe FOSS fa mide) is a chemotherapy drug. It slows the growth of cancer cells. This medicine is used to  treat many types of cancer like lymphoma, myeloma, leukemia, breast cancer, and ovarian cancer, to name a few. This medicine may be used for other purposes; ask your health care provider or pharmacist if you have questions. COMMON BRAND NAME(S): Cytoxan, Neosar What should I tell my health care provider before I take this medicine? They need to know if you have any of these conditions:  blood disorders  history of other chemotherapy  infection  kidney disease  liver disease  recent or ongoing radiation therapy  tumors in the bone marrow  an unusual or allergic reaction to cyclophosphamide, other chemotherapy, other medicines, foods, dyes, or preservatives  pregnant or trying to get pregnant  breast-feeding How should I use this medicine? This drug is usually given as an injection into a vein or muscle or by infusion into a vein. It is administered in a hospital or clinic by a specially trained health care professional. Talk to your pediatrician regarding the use of this medicine in children. Special care may be needed. Overdosage: If you think you have taken too much of this medicine contact a poison control center or emergency room at once. NOTE: This medicine is only for you. Do not share this medicine with others. What if I miss a dose? It is important not to miss your dose. Call your doctor or health care professional if you are unable to keep an appointment. What may interact with this medicine? This medicine may interact with the following medications:  amiodarone  amphotericin B  azathioprine  certain antiviral medicines for HIV or AIDS such as protease inhibitors (e.g., indinavir, ritonavir) and zidovudine  certain blood pressure medications such as benazepril, captopril, enalapril, fosinopril, lisinopril, moexipril, monopril, perindopril, quinapril, ramipril, trandolapril  certain cancer medications such as anthracyclines (e.g., daunorubicin, doxorubicin),  busulfan, cytarabine, paclitaxel, pentostatin, tamoxifen, trastuzumab  certain diuretics such as chlorothiazide, chlorthalidone, hydrochlorothiazide, indapamide, metolazone  certain medicines that treat or prevent blood clots like warfarin  certain muscle relaxants such as succinylcholine  cyclosporine  etanercept  indomethacin  medicines to increase blood counts like filgrastim, pegfilgrastim, sargramostim  medicines used as general anesthesia  metronidazole  natalizumab This list may not describe all possible interactions. Give your health care provider a list of all the medicines, herbs, non-prescription drugs, or dietary supplements you use. Also tell them if you smoke, drink alcohol, or use illegal drugs. Some items may interact with your medicine. What should I watch for while using this medicine? Visit your doctor for checks on your progress. This drug may make you feel generally unwell. This is not uncommon, as chemotherapy can affect healthy cells as well as cancer cells. Report any side effects. Continue your course of treatment even though you feel ill unless your doctor tells you to stop. Drink water or other fluids as directed. Urinate often, even at night. In some cases, you may be given additional medicines to help with side effects. Follow all directions for their use. Call your doctor or  health care professional for advice if you get a fever, chills or sore throat, or other symptoms of a cold or flu. Do not treat yourself. This drug decreases your body's ability to fight infections. Try to avoid being around people who are sick. This medicine may increase your risk to bruise or bleed. Call your doctor or health care professional if you notice any unusual bleeding. Be careful brushing and flossing your teeth or using a toothpick because you may get an infection or bleed more easily. If you have any dental work done, tell your dentist you are receiving this medicine. You  may get drowsy or dizzy. Do not drive, use machinery, or do anything that needs mental alertness until you know how this medicine affects you. Do not become pregnant while taking this medicine or for 1 year after stopping it. Women should inform their doctor if they wish to become pregnant or think they might be pregnant. Men should not father a child while taking this medicine and for 4 months after stopping it. There is a potential for serious side effects to an unborn child. Talk to your health care professional or pharmacist for more information. Do not breast-feed an infant while taking this medicine. This medicine may interfere with the ability to have a child. This medicine has caused ovarian failure in some women. This medicine has caused reduced sperm counts in some men. You should talk with your doctor or health care professional if you are concerned about your fertility. If you are going to have surgery, tell your doctor or health care professional that you have taken this medicine. What side effects may I notice from receiving this medicine? Side effects that you should report to your doctor or health care professional as soon as possible:  allergic reactions like skin rash, itching or hives, swelling of the face, lips, or tongue  low blood counts - this medicine may decrease the number of white blood cells, red blood cells and platelets. You may be at increased risk for infections and bleeding.  signs of infection - fever or chills, cough, sore throat, pain or difficulty passing urine  signs of decreased platelets or bleeding - bruising, pinpoint red spots on the skin, black, tarry stools, blood in the urine  signs of decreased red blood cells - unusually weak or tired, fainting spells, lightheadedness  breathing problems  dark urine  dizziness  palpitations  swelling of the ankles, feet, hands  trouble passing urine or change in the amount of urine  weight gain  yellowing  of the eyes or skin Side effects that usually do not require medical attention (report to your doctor or health care professional if they continue or are bothersome):  changes in nail or skin color  hair loss  missed menstrual periods  mouth sores  nausea, vomiting This list may not describe all possible side effects. Call your doctor for medical advice about side effects. You may report side effects to FDA at 1-800-FDA-1088. Where should I keep my medicine? This drug is given in a hospital or clinic and will not be stored at home. NOTE: This sheet is a summary. It may not cover all possible information. If you have questions about this medicine, talk to your doctor, pharmacist, or health care provider.  2020 Elsevier/Gold Standard (2012-06-23 16:22:58)   Pegfilgrastim injection(neulasta) What is this medicine? PEGFILGRASTIM (PEG fil gra stim) is a long-acting granulocyte colony-stimulating factor that stimulates the growth of neutrophils, a type of white blood  cell important in the body's fight against infection. It is used to reduce the incidence of fever and infection in patients with certain types of cancer who are receiving chemotherapy that affects the bone marrow, and to increase survival after being exposed to high doses of radiation. This medicine may be used for other purposes; ask your health care provider or pharmacist if you have questions. COMMON BRAND NAME(S): Steve Rattler, Ziextenzo What should I tell my health care provider before I take this medicine? They need to know if you have any of these conditions:  kidney disease  latex allergy  ongoing radiation therapy  sickle cell disease  skin reactions to acrylic adhesives (On-Body Injector only)  an unusual or allergic reaction to pegfilgrastim, filgrastim, other medicines, foods, dyes, or preservatives  pregnant or trying to get pregnant  breast-feeding How should I use this medicine? This  medicine is for injection under the skin. If you get this medicine at home, you will be taught how to prepare and give the pre-filled syringe or how to use the On-body Injector. Refer to the patient Instructions for Use for detailed instructions. Use exactly as directed. Tell your healthcare provider immediately if you suspect that the On-body Injector may not have performed as intended or if you suspect the use of the On-body Injector resulted in a missed or partial dose. It is important that you put your used needles and syringes in a special sharps container. Do not put them in a trash can. If you do not have a sharps container, call your pharmacist or healthcare provider to get one. Talk to your pediatrician regarding the use of this medicine in children. While this drug may be prescribed for selected conditions, precautions do apply. Overdosage: If you think you have taken too much of this medicine contact a poison control center or emergency room at once. NOTE: This medicine is only for you. Do not share this medicine with others. What if I miss a dose? It is important not to miss your dose. Call your doctor or health care professional if you miss your dose. If you miss a dose due to an On-body Injector failure or leakage, a new dose should be administered as soon as possible using a single prefilled syringe for manual use. What may interact with this medicine? Interactions have not been studied. Give your health care provider a list of all the medicines, herbs, non-prescription drugs, or dietary supplements you use. Also tell them if you smoke, drink alcohol, or use illegal drugs. Some items may interact with your medicine. This list may not describe all possible interactions. Give your health care provider a list of all the medicines, herbs, non-prescription drugs, or dietary supplements you use. Also tell them if you smoke, drink alcohol, or use illegal drugs. Some items may interact with your  medicine. What should I watch for while using this medicine? You may need blood work done while you are taking this medicine. If you are going to need a MRI, CT scan, or other procedure, tell your doctor that you are using this medicine (On-Body Injector only). What side effects may I notice from receiving this medicine? Side effects that you should report to your doctor or health care professional as soon as possible:  allergic reactions like skin rash, itching or hives, swelling of the face, lips, or tongue  back pain  dizziness  fever  pain, redness, or irritation at site where injected  pinpoint red spots on the skin  red or dark-brown urine  shortness of breath or breathing problems  stomach or side pain, or pain at the shoulder  swelling  tiredness  trouble passing urine or change in the amount of urine Side effects that usually do not require medical attention (report to your doctor or health care professional if they continue or are bothersome):  bone pain  muscle pain This list may not describe all possible side effects. Call your doctor for medical advice about side effects. You may report side effects to FDA at 1-800-FDA-1088. Where should I keep my medicine? Keep out of the reach of children. If you are using this medicine at home, you will be instructed on how to store it. Throw away any unused medicine after the expiration date on the label. NOTE: This sheet is a summary. It may not cover all possible information. If you have questions about this medicine, talk to your doctor, pharmacist, or health care provider.  2020 Elsevier/Gold Standard (2017-11-14 16:57:08)

## 2019-05-29 NOTE — Progress Notes (Signed)
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  Patient approved for grant. She was given a copy of the approval letter as well as the expense sheet along with the Outpatient pharmacy information. Went over expenses and how they are covered as well as showed her the mailbox for bills. She verbalized understanding.  She has my card for any additional financial questions or concerns.

## 2019-05-29 NOTE — Telephone Encounter (Signed)
Left vm to assess needs during 1st chemo. Contact information provided for questions or needs.

## 2019-05-30 ENCOUNTER — Ambulatory Visit: Payer: Medicare Other | Admitting: Family Medicine

## 2019-05-31 ENCOUNTER — Telehealth: Payer: Self-pay | Admitting: *Deleted

## 2019-05-31 NOTE — Telephone Encounter (Signed)
This RN spoke with per her VM stating noted " feelings of jitters and nervousness "  Per phone discussion Joanne Hansen states she thinks it is from the steroids and is asking if she could use the ativan during the day. She states the increased symptoms " is very uncomfortable feeling ".  This RN discussed above is secondary to the steroids, advised her to take 1/2 of the ativan pill (0.25 mg ) up to twice a day in addition to her 1 tab at bedtime while on the steroids.  Discussed use of above needs to be limited as best to no more then 5 days with each treatment so she will not become intolerant to the ativan and it work less effectively.  Joanne Hansen stated understanding as well as she is keeping a diary of symptoms and how she is relieving them so we can advise her properly.  No other questions at this time.  This message will be shared with provider for review of communication.

## 2019-06-04 ENCOUNTER — Telehealth: Payer: Self-pay | Admitting: *Deleted

## 2019-06-04 ENCOUNTER — Other Ambulatory Visit: Payer: Self-pay | Admitting: Family Medicine

## 2019-06-04 NOTE — Telephone Encounter (Signed)
This RN spoke with pt earlier today per her call stating " I had a kind of rough weekend and am not feeling overall good "  Joanne Hansen states she has decreased energy - and fatigues quickly post a short activity " just seem to give out easily "  She states " like the lower part of my body hurts and maybe it's my bowels - I don't know"  She states she is having bowel movements but they are " harder and smaller " with some straining.  She states pain is an aching in her legs and lower back.  This RN discussed above in relation to recent chemo and support medications.  Fatigue may be secondary to steroid use - discussed limiting activity with rest periods.  Discussed bowel changes with recommendations for stool softeners (Colace) up to bid to help move her bowels more easily.  Discussed " aching " likely secondary to CSF injection - discussed why pain is occurring and home management of use of claritin, pepcid and tylenol as well as use of warm or cold compresses with goal of what gives her best comfort.  Joanne Hansen verbalized understanding as well as she is keeping a journal.  She is scheduled for visit tomorrow and understands her support medications may be "tweeked" for best outcome.  Joanne Hansen verbalized understanding - no further questions at present.

## 2019-06-05 ENCOUNTER — Inpatient Hospital Stay: Payer: Medicare Other

## 2019-06-05 ENCOUNTER — Encounter: Payer: Self-pay | Admitting: Adult Health

## 2019-06-05 ENCOUNTER — Inpatient Hospital Stay (HOSPITAL_BASED_OUTPATIENT_CLINIC_OR_DEPARTMENT_OTHER): Payer: Medicare Other | Admitting: Adult Health

## 2019-06-05 ENCOUNTER — Other Ambulatory Visit: Payer: Self-pay

## 2019-06-05 ENCOUNTER — Telehealth: Payer: Self-pay

## 2019-06-05 VITALS — BP 99/68 | HR 107 | Temp 98.7°F | Resp 18 | Ht 62.0 in | Wt 122.7 lb

## 2019-06-05 DIAGNOSIS — Z95828 Presence of other vascular implants and grafts: Secondary | ICD-10-CM

## 2019-06-05 DIAGNOSIS — C50211 Malignant neoplasm of upper-inner quadrant of right female breast: Secondary | ICD-10-CM

## 2019-06-05 DIAGNOSIS — Z171 Estrogen receptor negative status [ER-]: Secondary | ICD-10-CM

## 2019-06-05 LAB — COMPREHENSIVE METABOLIC PANEL
ALT: 26 U/L (ref 0–44)
AST: 20 U/L (ref 15–41)
Albumin: 3.4 g/dL — ABNORMAL LOW (ref 3.5–5.0)
Alkaline Phosphatase: 82 U/L (ref 38–126)
Anion gap: 11 (ref 5–15)
BUN: 14 mg/dL (ref 8–23)
CO2: 24 mmol/L (ref 22–32)
Calcium: 8.8 mg/dL — ABNORMAL LOW (ref 8.9–10.3)
Chloride: 103 mmol/L (ref 98–111)
Creatinine, Ser: 0.82 mg/dL (ref 0.44–1.00)
GFR calc Af Amer: >60 mL/min (ref >60)
GFR calc non Af Amer: >60 mL/min (ref >60)
Glucose, Bld: 119 mg/dL — ABNORMAL HIGH (ref 70–99)
Potassium: 4.2 mmol/L (ref 3.5–5.1)
Sodium: 138 mmol/L (ref 135–145)
Total Bilirubin: 0.5 mg/dL (ref 0.3–1.2)
Total Protein: 6.4 g/dL — ABNORMAL LOW (ref 6.5–8.1)

## 2019-06-05 LAB — CBC WITH DIFFERENTIAL/PLATELET
Abs Immature Granulocytes: 1.58 10*3/uL — ABNORMAL HIGH (ref 0.00–0.07)
Basophils Absolute: 0.1 10*3/uL (ref 0.0–0.1)
Basophils Relative: 0 %
Eosinophils Absolute: 0.1 10*3/uL (ref 0.0–0.5)
Eosinophils Relative: 1 %
HCT: 40.9 % (ref 36.0–46.0)
Hemoglobin: 13.9 g/dL (ref 12.0–15.0)
Immature Granulocytes: 10 %
Lymphocytes Relative: 13 %
Lymphs Abs: 2.1 10*3/uL (ref 0.7–4.0)
MCH: 32.6 pg (ref 26.0–34.0)
MCHC: 34 g/dL (ref 30.0–36.0)
MCV: 96 fL (ref 80.0–100.0)
Monocytes Absolute: 4.2 10*3/uL — ABNORMAL HIGH (ref 0.1–1.0)
Monocytes Relative: 27 %
Neutro Abs: 7.8 10*3/uL — ABNORMAL HIGH (ref 1.7–7.7)
Neutrophils Relative %: 49 %
Platelets: 154 10*3/uL (ref 150–400)
RBC: 4.26 MIL/uL (ref 3.87–5.11)
RDW: 11.9 % (ref 11.5–15.5)
WBC: 15.8 10*3/uL — ABNORMAL HIGH (ref 4.0–10.5)
nRBC: 0.1 % (ref 0.0–0.2)

## 2019-06-05 MED ORDER — SODIUM CHLORIDE 0.9% FLUSH
10.0000 mL | INTRAVENOUS | Status: DC | PRN
  Administered 2019-06-05: 09:00:00 10 mL via INTRAVENOUS
  Filled 2019-06-05: qty 10

## 2019-06-05 MED ORDER — HEPARIN SOD (PORK) LOCK FLUSH 100 UNIT/ML IV SOLN
500.0000 [IU] | Freq: Once | INTRAVENOUS | Status: AC
  Administered 2019-06-05: 500 [IU] via INTRAVENOUS
  Filled 2019-06-05: qty 5

## 2019-06-05 MED ORDER — MAGIC MOUTHWASH: 5.0000 mL | Freq: Four times a day (QID) | ORAL | 0 refills | Status: DC | PRN

## 2019-06-05 MED ORDER — SODIUM CHLORIDE 0.9 % IV SOLN
INTRAVENOUS | Status: DC
  Administered 2019-06-05: 11:00:00 via INTRAVENOUS
  Filled 2019-06-05 (×2): qty 250

## 2019-06-05 MED ORDER — ONDANSETRON HCL 4 MG/2ML IJ SOLN
4.0000 mg | Freq: Once | INTRAMUSCULAR | Status: AC
  Administered 2019-06-05: 4 mg via INTRAVENOUS

## 2019-06-05 NOTE — Telephone Encounter (Signed)
[facility] 1:1:1 called in to [facility] for patient.

## 2019-06-05 NOTE — Patient Instructions (Signed)

## 2019-06-05 NOTE — Progress Notes (Signed)
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 She underwent unilateral right diagnostic mammography with tomography and right breast ultrasonography at [facility] on 04/12/2019 showing: Breast Density Category B. Spot compression views of the medial right breast, including tomography confirm an irregular mass spanning approximately 0.9 cm. On physical exam, no mass is palpated in the upper inner quadrant of the right breast. Targeted ultrasound is performed, showing a single irregular mass versus two immediately adjacent small irregular masses, that in total measure 1.1 cm. Individually, the two irregular masses measure 0.8 x 0.4 x 0.5 cm and 0.4 x 0.4 x 0.4 cm, respectively. Mass(es) are at 1 o'clock position 3 cm from the nipple. Ultrasound of the right axilla is negative for lymphadenopathy.  Accordingly on 04/16/2019 she proceeded to biopsy of the right breast area in question. The pathology from this procedure showed ([id number]): invasive ductal carcinoma, grade II. Prognostic indicators significant for: estrogen receptor, 0% negative and progesterone receptor, 0% negative,. Proliferation marker Ki67 at 20%. HER2 negative (1+) by  immunohistochemistry.  The patient's subsequent history is as detailed below.   INTERVAL HISTORY: Joanne Hansen returns today for follow-up and treatment of her  triple negative invasive ductal carcinoma. She was started on adjuvant chemotherapy with Docetaxel and Cyclophosphamide given on day 1 of a 21 day cycle x 4.  She is currently cycle 1 day 8 of treatment.  She is having some issues with feeling poorly today.  She does not have peripheral neuropathy.  REVIEW OF SYSTEMS: Joanne Hansen  Has had some constipation.  She started taking Colace and Pepcid, but hasn't noted a significant improvement in this.  She is drinking about 60 ounces of water per day.  She has had some increased back pain and tylenol is helping her with this.  She is tired.  She has some soreness on her lips and gums, but no ulcers.  She had one episode of dizziness, and took metoprolol for it.  She hasn't had any episodes since.  She does feel weak and tired and is struggling today.    She denies any fevers or chills.  She is without cough, shortness of breath, chest pain, or palpitations.  She has no nausea, vomiting, bladder issues.  A detailed ROS was otherwise non contributory.     PAST MEDICAL HISTORY: Past Medical History:  Diagnosis Date   Arthritis    Family history of brain cancer    Family history of breast cancer    Family history of colon cancer    Fecal incontinence    05-16-2019 per pt only a little leakage but controllable (was in clinical trial for [facility] without benefit.)   Frequent headaches    History of syncope    pre-syncope  08/ 2015  per pt no issue with this since    Hyperlipidemia    Malignant neoplasm of upper-inner quadrant  of right breast in female, estrogen receptor negative [facility]) oncologist-- dr Jana Hakim  dr moody   dx 08/ 2020--  invasive ductal carcinoma,  05-13-2019  s/p right breast lumpectomy    PAF (paroxysmal atrial fibrillation) ([place]) (05-16-2019   pt from [place], recently  moved to [place] to be near daughter, has not established a cardiology yet--  previously seen by dr Lowella Dandy cossu (last office note dated 02-28-2018 scanned in epic)---  echo 04-15-2014  ef 55-60%, G1DD mild AR without stenosis, RVSp 69mHg   Rectal cyst    Thyroid goiter    pt ultrasound in epic 06-04-2014 , nodules, no bx done   Urinary incontinence, mixed      PAST SURGICAL HISTORY: Past Surgical History:  Procedure Laterality Date   ABDOMINAL HYSTERECTOMY  1978   w/  RSO  and APPENDECTOMY   BREAST LUMPECTOMY WITH RADIOACTIVE SEED AND SENTINEL LYMPH NODE BIOPSY Right 05/15/2019   Procedure: RIGHT BREAST LUMPECTOMY WITH RADIOACTIVE SEED AND [place]ENTINEL [place]  Surgeon: CErroll Luna MD;  [facility] [facility]  Service: General;  Laterality: Right;   BREAST SURGERY  2011   biospy of right breast   CATARACT EXTRACTION W/ INTRAOCULAR LENS  IMPLANT, BILATERAL  2004   FLEXIBLE SIGMOIDOSCOPY N/A 05/18/2019   Procedure: FLEXIBLE SIGMOIDOSCOPY,  TRANSANAL EXCISION inclusion cyst;  Surgeon: TLeighton Ruff MD;  [place] [facility]  Service: General;  Laterality: N/A;   GLUTEUS MINIMUS REPAIR  2017   LUMBAR LAMINECTOMY  2013   L5 -- S1   PORTACATH PLACEMENT Right 05/15/2019   Procedure: INSERTION PORT-A-CATH WITH ULTRASOUND;  Surgeon: CErroll Luna MD;  [place] [facility]  Service: General;  Laterality: Right;   ROTATOR CUFF REPAIR Left 2001     FAMILY HISTORY: Family History  Problem Relation Age of Onset   COPD Mother    Cancer Mother 644      colon   Arthritis Mother    Hypertension Mother    Miscarriages / SKoreaMother    Stroke Mother 719  Alzheimer's disease Mother 830  Heart disease Father    Heart attack Father 228  Lung disease Father    Arthritis Sister    Breast cancer Sister 761      Lumpectomy   Diabetes Maternal Grandmother    Breast cancer Niece 364      Louise's Daughter     Cervical cancer Niece 481  The patient's father died at age 4747from a heart attack.  The patient's mother died at age 265 she had a gastrointestinal cancer diagnosed age 71  The patient has 1 sister with breast cancer and one niece with breast cancer diagnosed at age 71   GYNECOLOGIC HISTORY:  No LMP recorded. Patient has had a hysterectomy. 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  Her husband GGermain Osgood(goes by "GRonald Pippins) is a retired eChief Financial Officer  Their daughter KJohnanna Schneiders(currently divorced) lives in [place]  The patient has no grandchildren.  She is not a church attender   ADVANCED DIRECTIVES: The patient's husband is her healthcare power of attorney   HEALTH MAINTENANCE: Social History   Tobacco Use   Smoking status: Never Smoker   Smokeless tobacco: Never  Used  Substance Use Topics   Alcohol use: Yes   Drug use: Yes    Types: Amphetamines    Colonoscopy: Due 2022  PAP: Status post hysterectomy  Bone density: Pending   Allergies  Allergen Reactions   Penicillins Swelling   Sulfa Antibiotics Nausea And Vomiting    Current Outpatient Medications  Medication Sig Dispense Refill   Calcium-Phosphorus-Vitamin D (CITRACAL CALCIUM GUMMIES PO) Take 2 each by mouth daily.     Cyanocobalamin (VITAMIN B 12 PO) Take 1,000 mg by mouth daily. sublingal     dexamethasone (DECADRON) 4 MG tablet Take 2 tablets (8 mg total) by mouth 2 (two) times daily. Start the day before Taxotere. Then again the day after chemo for 3 days. 30 tablet 1   lidocaine-prilocaine (EMLA) cream Apply to affected area once 30 g 3   loratadine (CLARITIN) 10 MG tablet Take 1 tablet (10 mg total) by mouth daily. 90 tablet 0   LORazepam (ATIVAN) 0.5  MG tablet Take 1 tablet (0.5 mg total) by mouth at bedtime as needed (Nausea or vomiting). 30 tablet 0   methylcellulose oral powder Take by mouth every evening.      metoprolol tartrate (LOPRESSOR) 25 MG tablet Take 1/2 (one-half) tablet by mouth twice daily 90 tablet 0   ondansetron (ZOFRAN) 8 MG tablet Take 1 tablet (8 mg total) by mouth 2 (two) times daily as needed for refractory nausea / vomiting. Start on day 3 after chemo. 30 tablet 1   prochlorperazine (COMPAZINE) 10 MG tablet Take 1 tablet (10 mg total) by mouth every 6 (six) hours as needed (Nausea or vomiting). 30 tablet 1   simvastatin (ZOCOR) 40 MG tablet Take 1 tablet (40 mg total) by mouth daily. (Patient taking differently: Take 40 mg by mouth at bedtime. ) 90 tablet 1   magic mouthwash SOLN Take 5 mLs by mouth 4 (four) times daily as needed for mouth pain. 240 mL 0   Current Facility-Administered Medications  Medication Dose Route Frequency Provider Last Rate Last Dose   0.9 %  sodium chloride infusion   Intravenous Continuous Saanvi Hakala, Charlestine Massed, NP   Stopped at 06/05/19 1246   ondansetron (ZOFRAN) injection 4 mg  4 mg Intravenous Once PRN Magrinat, Virgie Dad, MD         OBJECTIVE: Middle-aged white woman in no acute distress  Vitals:   06/05/19 0927  BP: 99/68  Pulse: (!) 107  Resp: 18  Temp: 98.7 F (37.1 C)  SpO2: 98%   Wt Readings from Last 3 Encounters:  06/05/19 122 lb 11.2 oz (55.7 kg)  05/29/19 128 lb 1.6 oz (58.1 kg)  05/18/19 126 lb 6.4 oz (57.3 kg)   Body mass index is 22.44 kg/m.    ECOG FS:1 - Symptomatic but completely ambulatory  GENERAL: Patient appears to feel tired and unwell HEENT:  Sclerae anicteric.  Mask in place.  Neck is supple.  NODES:  No cervical, supraclavicular, or axillary lymphadenopathy palpated.  BREAST EXAM:  Deferred. LUNGS:  Clear to auscultation bilaterally.  No wheezes or rhonchi. HEART:  Regular rate and rhythm. No murmur appreciated. ABDOMEN:  Soft,  nontender.  Positive, normoactive bowel sounds. No organomegaly palpated. MSK:  No focal spinal tenderness to palpation. Full range of motion bilaterally in the upper extremities. EXTREMITIES:  No peripheral edema.   SKIN:  Clear with no obvious rashes or skin changes. No nail dyscrasia. NEURO:  Nonfocal. Well oriented.  Appropriate affect.     LAB RESULTS:  CMP     Component Value Date/Time   NA 138 06/05/2019 0924   K 4.2 06/05/2019 0924   CL 103 06/05/2019 0924   CO2 24 06/05/2019 0924   GLUCOSE 119 (H) 06/05/2019 0924   BUN 14 06/05/2019 0924   CREATININE 0.82 06/05/2019 0924   CREATININE 0.77 05/07/2019 1547   CALCIUM 8.8 (L) 06/05/2019 0924   PROT 6.4 (L) 06/05/2019 0924   ALBUMIN 3.4 (L) 06/05/2019 0924   AST 20 06/05/2019 0924   AST 19 05/07/2019 1547   ALT 26 06/05/2019 0924   ALT 13 05/07/2019 1547   ALKPHOS 82 06/05/2019 0924   BILITOT 0.5 06/05/2019 0924   BILITOT 0.5 05/07/2019 1547   GFRNONAA >60 06/05/2019 0924   GFRNONAA >60 05/07/2019 1547   GFRAA >60 06/05/2019 0924   GFRAA >60 05/07/2019 1547    No results found for: TOTALPROTELP, ALBUMINELP, A1GS, A2GS, BETS, BETA2SER, GAMS, MSPIKE, SPEI  No results found for: KPAFRELGTCHN, LAMBDASER, KAPLAMBRATIO  Lab Results  Component Value Date   WBC 15.8 (H) 06/05/2019   NEUTROABS 7.8 (H) 06/05/2019   HGB 13.9 06/05/2019   HCT 40.9 06/05/2019   MCV 96.0 06/05/2019   PLT 154 06/05/2019    No results found for: LABCA2  No components found for: GOTLXB262  No results for input(s): INR in the last 168 hours.  No results found for: LABCA2  No results found for: MBT597  No results found for: CBU384  No results found for: TXM468  No results found for: CA2729  No components found for: HGQUANT  No results found for: CEA1 / No results found for: CEA1   No results found for: AFPTUMOR  No results found for: CHROMOGRNA  No results found for: PSA1  Appointment on 06/05/2019  Component Date Value  Ref Range Status   Sodium 06/05/2019 138  135 - 145 mmol/L Final   Potassium 06/05/2019 4.2  3.5 - 5.1 mmol/L Final   Chloride 06/05/2019 103  98 - 111 mmol/L Final   CO2 06/05/2019 24  22 - 32 mmol/L Final   Glucose, Bld 06/05/2019 119* 70 - 99 mg/dL Final   BUN 06/05/2019 14  8 - 23 mg/dL Final   Creatinine, Ser 06/05/2019 0.82  0.44 - 1.00 mg/dL Final   Calcium 06/05/2019 8.8* 8.9 - 10.3 mg/dL Final   Total Protein 06/05/2019 6.4* 6.5 - 8.1 g/dL Final   Albumin 06/05/2019 3.4* 3.5 - 5.0 g/dL Final   AST 06/05/2019 20  15 - 41 U/L Final   ALT 06/05/2019 26  0 - 44 U/L Final   Alkaline Phosphatase 06/05/2019 82  38 - 126 U/L Final   Total Bilirubin 06/05/2019 0.5  0.3 - 1.2 mg/dL Final   GFR calc non Af Amer 06/05/2019 >60  >60 mL/min Final   GFR calc Af Amer 06/05/2019 >60  >60 mL/min Final   Anion gap 06/05/2019 11  5 - 15 Final   Performed at [facility], [place] [address], [place], [place] [address]   WBC 06/05/2019 15.8* 4.0 - 10.5 K/uL Final   RBC 06/05/2019 4.26  3.87 - 5.11 MIL/uL Final   Hemoglobin 06/05/2019 13.9  12.0 - 15.0 g/dL Final   HCT 06/05/2019 40.9  36.0 - 46.0 % Final   MCV 06/05/2019 96.0  80.0 - 100.0 fL Final   MCH 06/05/2019 32.6  26.0 - 34.0 pg Final   MCHC 06/05/2019 34.0  30.0 - 36.0 g/dL Final   RDW 06/05/2019 11.9  11.5 - 15.5 % Final   Platelets 06/05/2019 154  150 - 400 K/uL Final   nRBC 06/05/2019 0.1  0.0 - 0.2 % Final   Neutrophils Relative % 06/05/2019 49  % Final   Neutro Abs 06/05/2019 7.8* 1.7 - 7.7 K/uL Final   Lymphocytes Relative 06/05/2019 13  % Final   Lymphs Abs 06/05/2019 2.1  0.7 - 4.0 K/uL Final   Monocytes Relative 06/05/2019 27  % Final   Monocytes Absolute 06/05/2019 4.2* 0.1 - 1.0 K/uL Final   Eosinophils Relative 06/05/2019 1  % Final   Eosinophils Absolute 06/05/2019 0.1  0.0 - 0.5 K/uL Final   Basophils Relative 06/05/2019 0  % Final   Basophils Absolute 06/05/2019  0.1  0.0 - 0.1 K/uL Final   Immature Granulocytes 06/05/2019 10  % Final   Increased IG's, likely caused by Bone Marrow Colony Stimulating Factor received within 30 days.   Abs Immature Granulocytes 06/05/2019 1.58* 0.00 - 0.07 K/uL Final   Performed at [facility], [place] [address], [place],  [address]    (this displays the last labs from the last 3 days)  No results found for: TOTALPROTELP, ALBUMINELP, A1GS, A2GS, BETS, BETA2SER, GAMS, MSPIKE, SPEI (this displays SPEP labs)  No results found for: KPAFRELGTCHN, LAMBDASER, KAPLAMBRATIO (kappa/lambda light chains)  No results found for: HGBA, HGBA2QUANT, HGBFQUANT, HGBSQUAN (Hemoglobinopathy evaluation)   No results found for: LDH  No results found for: IRON, TIBC, IRONPCTSAT (Iron and TIBC)  No results found for: FERRITIN  Urinalysis No results found for: COLORURINE, APPEARANCEUR, LABSPEC, PHURINE, GLUCOSEU, HGBUR, BILIRUBINUR, KETONESUR, PROTEINUR, UROBILINOGEN, NITRITE, LEUKOCYTESUR   STUDIES:  Nm Sentinel Node Inj-no Rpt (breast)  Result Date: 05/15/2019 Sulfur colloid was injected by the nuclear medicine technologist for melanoma sentinel node.   Mm Breast Surgical Specimen  Result Date: 05/15/2019 CLINICAL DATA:  Radioactive seed placement of the right breast was performed May 14, 2019 prior to lumpectomy. EXAM: SPECIMEN RADIOGRAPH OF THE RIGHT BREAST COMPARISON:  Previous exam(s). FINDINGS: Status post excision of the right breast. The radioactive seed and biopsy marker clip are present, completely intact, and were marked for pathology. IMPRESSION: Specimen radiograph of the right breast. Electronically Signed   By: Curlene Dolphin M.D.   On: 05/15/2019 13:04   Dg Chest Port 1 View  Result Date: 05/15/2019 CLINICAL DATA:  Port-A-Cath insertion. EXAM: PORTABLE CHEST 1 VIEW COMPARISON:  No recent prior. FINDINGS: Port-A-Cath noted with tip over superior vena cava. Subcutaneous emphysema  noted at the insertion site. No pneumothorax identified. Cardiomegaly. No pulmonary venous congestion. Right perihilar atelectatic changes are noted. Densities noted over the right upper chest may be outside the patient. No pleural effusion. IMPRESSION: Port-A-Cath noted with tip over superior vena cava. Subcutaneous emphysema noted at the insertion site. No pneumothorax identified. Electronically Signed   By: Marcello Moores  Register   On: 05/15/2019 14:39   Dg Fluoro Guide Cv Line-no Report  Result Date: 05/15/2019 Fluoroscopy was utilized by the requesting physician.  No radiographic interpretation.   Mm Rt Radioactive Seed Loc Mammo Guide  Result Date: 05/14/2019 CLINICAL DATA:  The patient presents for seed localization prior to RIGHT lumpectomy for recently diagnosed grade 3 invasive ductal carcinoma. EXAM: MAMMOGRAPHIC GUIDED RADIOACTIVE SEED LOCALIZATION OF THE RIGHT BREAST COMPARISON:  Previous exam(s). FINDINGS: Patient presents for radioactive seed localization prior to lumpectomy. I met with the patient and we discussed the procedure of seed localization including benefits and alternatives. We discussed the high likelihood of a  successful procedure. We discussed the risks of the procedure including infection, bleeding, tissue injury and further surgery. We discussed the low dose of radioactivity involved in the procedure. Informed, written consent was given. The usual time-out protocol was performed immediately prior to the procedure. Using mammographic guidance, sterile technique, 1% lidocaine and an I-125 radioactive seed, the ribbon shaped clip in the MEDIAL portion of the RIGHT breast was localized using a MEDIAL to LATERAL approach. The follow-up mammogram images confirm the seed in the expected location and were marked for Dr. Brantley Stage. Follow-up survey of the patient confirms presence of the radioactive seed. Order number of I-125 seed:  [id number]. Total activity:  8.295 millicuries reference Date:  05/07/2019 The patient tolerated the procedure well and was released from the [facility]. She was given instructions regarding seed removal. IMPRESSION: Radioactive seed localization right breast. No apparent complications. Electronically Signed   By: Nolon Nations M.D.   On: 05/14/2019 15:20     ELIGIBLE FOR AVAILABLE RESEARCH PROTOCOL: NO   ASSESSMENT: 73 y.o. Joanne Hansen, [place] woman status post right breast upper quadrant biopsy for an mT1b (or single t1c0 invasive ductal carcinoma, grade 2, triple negative, with an MIB-1-1 of 20%.  (1) Genetic testing on 05/07/2019 through the [facility] Breast Cancer STAT Panel + Common Hereditary cancer panel found no pathogenic mutations. The STAT Breast cancer panel offered by [facility] includes sequencing and rearrangement analysis for the following 9 genes:  ATM, BRCA1, BRCA2, CDH1, CHEK2, PALB2, PTEN, STK11 and TP53.  The Common Hereditary Cancers Panel offered by [facility] includes sequencing and/or deletion duplication testing of the following 47 genes: APC, ATM, AXIN2, BARD1, BMPR1A, BRCA1, BRCA2, BRIP1, CDH1, CDKN2A (p14ARF), CDKN2A (p16INK4a), CKD4, CHEK2, CTNNA1, DICER1, EPCAM (Deletion/duplication testing only), GREM1 (promoter region deletion/duplication testing only), KIT, MEN1, MLH1, MSH2, MSH3, MSH6, MUTYH, NBN, NF1, NHTL1, PALB2, PDGFRA, PMS2, POLD1, POLE, PTEN, RAD50, RAD51C, RAD51D, SDHB, SDHC, SDHD, SMAD4, SMARCA4. STK11, TP53, TSC1, TSC2, and VHL.  The following genes were evaluated for sequence changes only: SDHA and HOXB13 c.251G>A variant only.  (2) s/p right lumpectomy and sentinel lymph node sampling 05/15/2019 for a pT1b pN0 stage IB invasive ductal carcinoma, grade 3, with negative margins.  (a) a total of 2 sentinel lymph nodes were removed  (3) adjuvant chemotherapy to consist of cyclophosphamide and docetaxel every 21 days x 4, first dose May 29, 2019  (4) adjuvant radiation to follow  (5) consider prophylactic  antiestrogens   PLAN: Mardene is doing ok today.  She is not feeling very well after her treatment.  She is not neutropenic.  I think she would benefit from IV fluids today, along with IV nausea medication and I placed orders for her to receive this.  She will also receive IV fluids on injection day and the day after for future treatments to avoid dehydration.  I reviewed with her in detail a bowel regimen that can help with getting her bowel regular.  She was isntructed to take 1-2 tablets of Senokot s BID PRN, Miralax daily prn, and then Magnesium Citrate 1/2 bottle Q4 dayprn.  She understands this.    Marguetta will return in 2 weeks for labs, f/u, and her next treatment.  She was recommended to continue with the appropriate pandemic precautions. She knows to call for any questions that may arise between now and her next appointment.  We are happy to see her sooner if needed.  A total of (30) minutes of face-to-face time was spent with this patient with greater  than 50% of that time in counseling and care-coordination.  Wilber Bihari, NP 06/08/19 2:42 PM Medical Oncology and Hematology [facility] [address] [place], [place] [address] Tel. [phone]    Fax. [phone]

## 2019-06-05 NOTE — Patient Instructions (Signed)
For your bowels take Senokot-S as many as 2 tablets twice a day.  You can add Miralax daily if the Senekot-S isn't working.  If you go without a bowel movement in 3-4 days you can take Magnesium Citrate 1/2 bottle, followed by 8 ounces of water.  If no results in 4-6 hours, you can take the other 1/2 bottle, followed by 8 ounces of water.

## 2019-06-06 ENCOUNTER — Inpatient Hospital Stay: Payer: Medicare Other

## 2019-06-06 ENCOUNTER — Other Ambulatory Visit: Payer: Self-pay

## 2019-06-06 VITALS — BP 96/73 | HR 84 | Temp 98.5°F | Resp 17

## 2019-06-06 DIAGNOSIS — C50211 Malignant neoplasm of upper-inner quadrant of right female breast: Secondary | ICD-10-CM | POA: Diagnosis not present

## 2019-06-06 MED ORDER — SODIUM CHLORIDE 0.9 % IV SOLN
INTRAVENOUS | Status: AC
  Administered 2019-06-06: 15:00:00 via INTRAVENOUS
  Filled 2019-06-06 (×2): qty 250

## 2019-06-06 MED ORDER — ONDANSETRON HCL 4 MG/2ML IJ SOLN: 4.0000 mg | Freq: Once | INTRAMUSCULAR | Status: DC | PRN

## 2019-06-06 NOTE — Patient Instructions (Signed)
Dehydration, Adult  Dehydration is when there is not enough fluid or water in your body. This happens when you lose more fluids than you take in. Dehydration can range from mild to very bad. It should be treated right away to keep it from getting very bad. Symptoms of mild dehydration may include:  Thirst.  Dry lips.  Slightly dry mouth.  Dry, warm skin.  Dizziness. Symptoms of moderate dehydration may include:  Very dry mouth.  Muscle cramps.  Dark pee (urine). Pee may be the color of tea.  Your body making less pee.  Your eyes making fewer tears.  Heartbeat that is uneven or faster than normal (palpitations).  Headache.  Light-headedness, especially when you stand up from sitting.  Fainting (syncope). Symptoms of very bad dehydration may include:  Changes in skin, such as: ? Cold and clammy skin. ? Blotchy (mottled) or pale skin. ? Skin that does not quickly return to normal after being lightly pinched and let go (poor skin turgor).  Changes in body fluids, such as: ? Feeling very thirsty. ? Your eyes making fewer tears. ? Not sweating when body temperature is high, such as in hot weather. ? Your body making very little pee.  Changes in vital signs, such as: ? Weak pulse. ? Pulse that is more than 100 beats a minute when you are sitting still. ? Fast breathing. ? Low blood pressure.  Other changes, such as: ? Sunken eyes. ? Cold hands and feet. ? Confusion. ? Lack of energy (lethargy). ? Trouble waking up from sleep. ? Short-term weight loss. ? Unconsciousness. Follow these instructions at home:   If told by your doctor, drink an ORS: ? Make an ORS by using instructions on the package. ? Start by drinking small amounts, about  cup (120 mL) every 5-10 minutes. ? Slowly drink more until you have had the amount that your doctor said to have.  Drink enough clear fluid to keep your pee clear or pale yellow. If you were told to drink an ORS, finish the  ORS first, then start slowly drinking clear fluids. Drink fluids such as: ? Water. Do not drink only water by itself. Doing that can make the salt (sodium) level in your body get too low (hyponatremia). ? Ice chips. ? Fruit juice that you have added water to (diluted). ? Low-calorie sports drinks.  Avoid: ? Alcohol. ? Drinks that have a lot of sugar. These include high-calorie sports drinks, fruit juice that does not have water added, and soda. ? Caffeine. ? Foods that are greasy or have a lot of fat or sugar.  Take over-the-counter and prescription medicines only as told by your doctor.  Do not take salt tablets. Doing that can make the salt level in your body get too high (hypernatremia).  Eat foods that have minerals (electrolytes). Examples include bananas, oranges, potatoes, tomatoes, and spinach.  Keep all follow-up visits as told by your doctor. This is important. Contact a doctor if:  You have belly (abdominal) pain that: ? Gets worse. ? Stays in one area (localizes).  You have a rash.  You have a stiff neck.  You get angry or annoyed more easily than normal (irritability).  You are more sleepy than normal.  You have a harder time waking up than normal.  You feel: ? Weak. ? Dizzy. ? Very thirsty.  You have peed (urinated) only a small amount of very dark pee during 6-8 hours. Get help right away if:  You have   symptoms of very bad dehydration.  You cannot drink fluids without throwing up (vomiting).  Your symptoms get worse with treatment.  You have a fever.  You have a very bad headache.  You are throwing up or having watery poop (diarrhea) and it: ? Gets worse. ? Does not go away.  You have blood or something green (bile) in your throw-up.  You have blood in your poop (stool). This may cause poop to look black and tarry.  You have not peed in 6-8 hours.  You pass out (faint).  Your heart rate when you are sitting still is more than 100 beats a  minute.  You have trouble breathing. This information is not intended to replace advice given to you by your health care provider. Make sure you discuss any questions you have with your health care provider. Document Released: 06/05/2009 Document Revised: 07/22/2017 Document Reviewed: 10/03/2015 Elsevier Patient Education  2020 Elsevier Inc.  

## 2019-06-18 NOTE — Progress Notes (Signed)
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   She was started on adjuvant chemotherapy with Docetaxel and Cyclophosphamide given on day 1 of a 21 day cycle x 4.  She is currently cycle 2 day 1 of treatment.    She had some dehydration after the first cycle and she is going to get additional IV fluids.  She had a lot of back pain from the OnPro and we discussed that at length today.  She has not filled out Joanne Hansen oxycodone prescription.  She is taking lorazepam every night instead of simply on the nights when she gets Decadron and we discussed that as well.  Otherwise she is doing quite well with treatment and is proceeding to Joanne Hansen second cycle today   REVIEW OF SYSTEMS: Joanne Hansen Hansen is being very careful regarding the pandemic.  She would like to have some friends over but she understands she cannot do that indoors.  Possibly they could do it outdoors if they keep a 10 foot distance and mask.  She would like Joanne Hansen Hansen to be able to visit.  Joanne Hansen Hansen is quarantining and has been in Joanne Hansen home for the last 2 weeks so I think again if Joanne Hansen Hansen visits and stays outside that probably would be okay.  Venora does understand that she is at higher risk if she does get the virus and  she needs to really take every precaution to not get infected.  A detailed review of systems today was otherwise stable.   HISTORY OF CURRENT ILLNESS: From the original intake note:  GIAH FICKETT had routine screening mammography on 04/10/2019 showing a possible abnormality in the right breast. She underwent unilateral right diagnostic mammography with tomography and right breast ultrasonography at [facility] on 04/12/2019 showing: Breast Density Category B. Spot compression views of the medial right breast, including tomography confirm an irregular mass spanning approximately 0.9 cm. On physical exam, no mass is palpated in the upper inner quadrant of the right breast. Targeted ultrasound is performed, showing a single irregular mass versus two immediately adjacent small irregular masses, that in total measure 1.1 cm. Individually, the two irregular masses measure 0.8 x 0.4 x 0.5 cm and 0.4 x 0.4 x 0.4 cm, respectively. Mass(es) are at 1 o'clock position 3 cm from the nipple. Ultrasound of the right axilla is negative for lymphadenopathy.  Accordingly on 04/16/2019 she proceeded to biopsy of the right breast area in question. The pathology from this procedure showed ([id number]): invasive ductal carcinoma, grade II. Prognostic indicators significant for: estrogen receptor, 0% negative and progesterone receptor, 0% negative,. Proliferation marker Ki67 at 20%. HER2 negative (1+) by immunohistochemistry.  The patient's subsequent history is as detailed below.   PAST MEDICAL HISTORY: Past Medical History:  Diagnosis Date   Arthritis    Family history of brain cancer    Family history of breast cancer  Family history of colon cancer    Fecal incontinence    05-16-2019 per pt only a little leakage but controllable (was in clinical trial for [facility] without benefit.)   Frequent headaches    History of syncope    pre-syncope  08/ 2015  per pt no issue with this since     Hyperlipidemia    Malignant neoplasm of upper-inner quadrant of right breast in female, estrogen receptor negative [facility]) oncologist-- dr Jana Hakim  dr moody   dx 08/ 2020--  invasive ductal carcinoma,  05-13-2019  s/p right breast lumpectomy    PAF (paroxysmal atrial fibrillation) ([place]) (05-16-2019   pt from [place], recently moved to [place] to be near Hansen, has not established a cardiology yet--  previously seen by dr Lowella Dandy cossu (last office note dated 02-28-2018 scanned in epic)---  echo 04-15-2014  ef 55-60%, G1DD mild AR without stenosis, RVSp 44mHg   Rectal cyst    Thyroid goiter    pt ultrasound in epic 06-04-2014 , nodules, no bx done   Urinary incontinence, mixed      PAST SURGICAL HISTORY: Past Surgical History:  Procedure Laterality Date   ABDOMINAL HYSTERECTOMY  1978   w/  RSO  and APPENDECTOMY   BREAST LUMPECTOMY WITH RADIOACTIVE SEED AND SENTINEL LYMPH NODE BIOPSY Right 05/15/2019   Procedure: RIGHT BREAST L[facility]OMY WITH RADIOACTIVE SEED AND SENTINEL [facility]  Surgeon: CErroll Luna MD;  [place] [facility]  Service: General;  Laterality: Right;   BREAST SURGERY  2011   biospy of right breast   CATARACT EXTRACTION W/ INTRAOCULAR LENS  IMPLANT, BILATERAL  2004   FLEXIBLE SIGMOIDOSCOPY N/A 05/18/2019   Procedure: FLEXIBLE SIGMOIDOSCOPY,  TRANSANAL EXCISION inclusion cyst;  Surgeon: TLeighton Ruff MD;  [facility]  Service: General;  Laterality: N/A;   GLUTEUS MINIMUS REPAIR  2017   LUMBAR LAMINECTOMY  2013   L5 -- S1   PORTACATH PLACEMENT Right 05/15/2019   Procedure: INSERTION PORT-A-CATH WITH ULTRASOUND;  Surgeon: CErroll Luna MD;  [facility]  Service: General;  Laterality: Right;   ROTATOR CUFF REPAIR Left 2001     FAMILY HISTORY: Family History  Problem Relation Age of Onset   COPD Mother    Cancer Mother 670      colon   Arthritis Mother    Hypertension  Mother    Miscarriages / SKoreaMother    Stroke Mother 723  Alzheimer's disease Mother 834  Heart disease Father    Heart attack Father 239  Lung disease Father    Arthritis Sister    Breast cancer Sister 747      Lumpectomy   Diabetes Maternal Grandmother    Breast cancer Niece 363      Joanne Hansen Hansen   Cervical cancer Niece 473  The patient's father died at age 7615from a heart attack.  The patient's mother died at age [id number] she had a gastrointestinal cancer diagnosed age 71  The patient has 1 sister with breast cancer and one niece with breast cancer diagnosed at age 71   GYNECOLOGIC HISTORY:  No LMP recorded. Patient has had a hysterectomy. 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  Joanne Hansen  husband Germain Osgood (goes by "Ronald Pippins") is a retired Chief Financial Officer.  Their Hansen Joanne Hansen Hansen (currently divorced) lives in [place].  The patient has no grandchildren.  She is not a church attender   ADVANCED DIRECTIVES: The patient's husband is Joanne Hansen healthcare power of attorney   HEALTH MAINTENANCE: Social History   Tobacco Use   Smoking status: Never Smoker   Smokeless tobacco: Never Used  Substance Use Topics   Alcohol use: Yes   Drug use: Yes    Types: Amphetamines    Colonoscopy: Due 2022  PAP: Status post hysterectomy  Bone density: Pending   Allergies  Allergen Reactions   Penicillins Swelling   Sulfa Antibiotics Nausea And Vomiting    Current Outpatient Medications  Medication Sig Dispense Refill   Calcium-Phosphorus-Vitamin D (CITRACAL CALCIUM GUMMIES PO) Take 2 each by mouth daily.     Cyanocobalamin (VITAMIN B 12 PO) Take 1,000 mg by mouth daily. sublingal     dexamethasone (DECADRON) 4 MG tablet Take  2 tablets (8 mg total) by mouth 2 (two) times daily. Start the day before Taxotere. Then again the day after chemo for 3 days. 30 tablet 1   lidocaine-prilocaine (EMLA) cream Apply to affected area once 30 g 3   loratadine (CLARITIN) 10 MG tablet Take 1 tablet (10 mg total) by mouth daily. 90 tablet 0   LORazepam (ATIVAN) 0.5 MG tablet Take 1 tablet (0.5 mg total) by mouth at bedtime as needed (Nausea or vomiting). 30 tablet 0   magic mouthwash SOLN Take 5 mLs by mouth 4 (four) times daily as needed for mouth pain. 240 mL 0   methylcellulose oral powder Take by mouth every evening.      metoprolol tartrate (LOPRESSOR) 25 MG tablet Take 1/2 (one-half) tablet by mouth twice daily 90 tablet 0   ondansetron (ZOFRAN) 8 MG tablet Take 1 tablet (8 mg total) by mouth 2 (two) times daily as needed for refractory nausea / vomiting. Start on day 3 after chemo. 30 tablet 1   prochlorperazine (COMPAZINE) 10 MG tablet Take 1 tablet (10 mg total) by mouth every 6 (six) hours as needed (Nausea or vomiting). 30 tablet 1   simvastatin (ZOCOR) 40 MG tablet Take 1 tablet (40 mg total) by mouth daily. (Patient taking differently: Take 40 mg by mouth at bedtime. ) 90 tablet 1   Current Facility-Administered Medications  Medication Dose Route Frequency Provider Last Rate Last Dose   ondansetron (ZOFRAN) injection 4 mg  4 mg Intravenous Once PRN Selah Zelman, Joanne Hansen Dad, MD         OBJECTIVE: Middle-aged white woman who appears stated age  Vitals:   06/19/19 0914  BP: 112/65  Pulse: 68  Resp: 18  Temp: 98.3 F (36.8 C)  SpO2: 99%   Wt Readings from Last 3 Encounters:  06/19/19 128 lb 14.4 oz (58.5 kg)  06/05/19 122 lb 11.2 oz (55.7 kg)  05/29/19 128 lb 1.6 oz (58.1 kg)   Body mass index is 23.58 kg/m.    ECOG FS:1 - Symptomatic but completely ambulatory   Sclerae unicteric, EOMs intact Wearing a mask No cervical or supraclavicular adenopathy Lungs no rales or rhonchi Heart regular rate and  rhythm Abd soft, nontender, positive bowel sounds MSK no focal spinal tenderness, no upper extremity lymphedema Neuro: nonfocal, well oriented, appropriate affect Breasts: The right breast is status post lumpectomy.  The incision has healed very nicely.  The left breast is benign.  Both axillae are benign.   LAB RESULTS:  CMP     Component Value Date/Time   NA 143 06/19/2019 0900   K 3.9 06/19/2019 0900   CL 110 06/19/2019 0900   CO2 23 06/19/2019 0900   GLUCOSE 152 (H) 06/19/2019 0900   BUN 16 06/19/2019 0900   CREATININE 0.71 06/19/2019 0900   CREATININE 0.77 05/07/2019 1547   CALCIUM 9.7 06/19/2019 0900   PROT 6.7 06/19/2019 0900   ALBUMIN 3.8 06/19/2019 0900   AST 11 (L) 06/19/2019 0900   AST 19 05/07/2019 1547   ALT 11 06/19/2019 0900   ALT 13 05/07/2019 1547   ALKPHOS 81 06/19/2019 0900   BILITOT 0.5 06/19/2019 0900   BILITOT 0.5 05/07/2019 1547   GFRNONAA >60 06/19/2019 0900   GFRNONAA >60 05/07/2019 1547   GFRAA >60 06/19/2019 0900   GFRAA >60 05/07/2019 1547    No results found for: TOTALPROTELP, ALBUMINELP, A1GS, A2GS, BETS, BETA2SER, GAMS, MSPIKE, SPEI  No results found for: KPAFRELGTCHN, LAMBDASER, KAPLAMBRATIO  Lab Results  Component Value Date   WBC 7.3 06/19/2019   NEUTROABS 6.1 06/19/2019   HGB 11.8 (L) 06/19/2019   HCT 35.2 (L) 06/19/2019   MCV 96.7 06/19/2019   PLT 390 06/19/2019    No results found for: LABCA2  No components found for: [id number]  No results for input(s): INR in the last 168 hours.  No results found for: LABCA2  No results found for: JTT017  No results found for: BLT903  No results found for: ESP233  No results found for: CA2729  No components found for: HGQUANT  No results found for: CEA1 / No results found for: CEA1   No results found for: AFPTUMOR  No results found for: CHROMOGRNA  No results found for: PSA1  Appointment on 06/19/2019  Component Date Value Ref Range Status   WBC 06/19/2019 7.3  4.0 -  10.5 K/uL Final   RBC 06/19/2019 3.64* 3.87 - 5.11 MIL/uL Final   Hemoglobin 06/19/2019 11.8* 12.0 - 15.0 g/dL Final   HCT 06/19/2019 35.2* 36.0 - 46.0 % Final   MCV 06/19/2019 96.7  80.0 - 100.0 fL Final   MCH 06/19/2019 32.4  26.0 - 34.0 pg Final   MCHC 06/19/2019 33.5  30.0 - 36.0 g/dL Final   RDW 06/19/2019 12.6  11.5 - 15.5 % Final   Platelets 06/19/2019 390  150 - 400 K/uL Final   nRBC 06/19/2019 0.0  0.0 - 0.2 % Final   Neutrophils Relative % 06/19/2019 84  % Final   Neutro Abs 06/19/2019 6.1  1.7 - 7.7 K/uL Final   Lymphocytes Relative 06/19/2019 7  % Final   Lymphs Abs 06/19/2019 0.5* 0.7 - 4.0 K/uL Final   Monocytes Relative 06/19/2019 9  % Final   Monocytes Absolute 06/19/2019 0.7  0.1 - 1.0 K/uL Final   Eosinophils Relative 06/19/2019 0  % Final   Eosinophils Absolute 06/19/2019 0.0  0.0 - 0.5 K/uL Final   Basophils Relative 06/19/2019 0  % Final   Basophils Absolute 06/19/2019 0.0  0.0 - 0.1 K/uL Final   Immature Granulocytes 06/19/2019 0  % Final   Abs Immature Granulocytes 06/19/2019 0.03  0.00 - 0.07 K/uL Final   Performed at [facility], [place] [address], [place], [place] [address]   Sodium 06/19/2019 143  135 - 145 mmol/L Final   Potassium 06/19/2019 3.9  3.5 - 5.1 mmol/L Final   Chloride 06/19/2019 110  98 - 111 mmol/L Final   CO2 06/19/2019 23  22 -  32 mmol/L Final   Glucose, Bld 06/19/2019 152* 70 - 99 mg/dL Final   BUN 06/19/2019 16  8 - 23 mg/dL Final   Creatinine, Ser 06/19/2019 0.71  0.44 - 1.00 mg/dL Final   Calcium 06/19/2019 9.7  8.9 - 10.3 mg/dL Final   Total Protein 06/19/2019 6.7  6.5 - 8.1 g/dL Final   Albumin 06/19/2019 3.8  3.5 - 5.0 g/dL Final   AST 06/19/2019 11* 15 - 41 U/L Final   ALT 06/19/2019 11  0 - 44 U/L Final   Alkaline Phosphatase 06/19/2019 81  38 - 126 U/L Final   Total Bilirubin 06/19/2019 0.5  0.3 - 1.2 mg/dL Final   GFR calc non Af Amer 06/19/2019 >60  >60 mL/min Final     GFR calc Af Amer 06/19/2019 >60  >60 mL/min Final   Anion gap 06/19/2019 10  5 - 15 Final   Performed at [facility], [place] [address], [place], [place] [address]    (this displays the last labs from the last 3 days)  No results found for: TOTALPROTELP, ALBUMINELP, A1GS, A2GS, BETS, BETA2SER, GAMS, MSPIKE, SPEI (this displays SPEP labs)  No results found for: KPAFRELGTCHN, LAMBDASER, KAPLAMBRATIO (kappa/lambda light chains)  No results found for: HGBA, HGBA2QUANT, HGBFQUANT, HGBSQUAN (Hemoglobinopathy evaluation)   No results found for: LDH  No results found for: IRON, TIBC, IRONPCTSAT (Iron and TIBC)  No results found for: FERRITIN  Urinalysis No results found for: COLORURINE, APPEARANCEUR, LABSPEC, PHURINE, GLUCOSEU, HGBUR, BILIRUBINUR, KETONESUR, PROTEINUR, UROBILINOGEN, NITRITE, LEUKOCYTESUR   STUDIES:  No results found.   ELIGIBLE FOR AVAILABLE RESEARCH PROTOCOL: NO   ASSESSMENT: 73 y.o. [place], [place] woman status post right breast upper quadrant biopsy for an mT1b (or single t1c0 invasive ductal carcinoma, grade 2, triple negative, with an MIB-1-1 of 20%.  (1) Genetic testing on 05/07/2019 through the [facility] Breast Cancer STAT Panel + Common Hereditary cancer panel found no pathogenic mutations. The STAT Breast cancer panel offered by [facility] includes sequencing and rearrangement analysis for the following 9 genes:  ATM, BRCA1, BRCA2, CDH1, CHEK2, PALB2, PTEN, STK11 and TP53.  The Common Hereditary Cancers Panel offered by [facility] includes sequencing and/or deletion duplication testing of the following 47 genes: APC, ATM, AXIN2, BARD1, BMPR1A, BRCA1, BRCA2, BRIP1, CDH1, CDKN2A (p14ARF), CDKN2A (p16INK4a), CKD4, CHEK2, CTNNA1, DICER1, EPCAM (Deletion/duplication testing only), GREM1 (promoter region deletion/duplication testing only), KIT, MEN1, MLH1, MSH2, MSH3, MSH6, MUTYH, NBN, NF1, NHTL1, PALB2, PDGFRA, PMS2, POLD1, POLE, PTEN, RAD50, RAD51C,  RAD51D, SDHB, SDHC, SDHD, SMAD4, SMARCA4. STK11, TP53, TSC1, TSC2, and VHL.  The following genes were evaluated for sequence changes only: SDHA and HOXB13 c.251G>A variant only.  (2) s/p right lumpectomy and sentinel lymph node sampling 05/15/2019 for a pT1b pN0 stage IB invasive ductal carcinoma, grade 3, with negative margins.  (a) a total of 2 sentinel lymph nodes were removed  (3) adjuvant chemotherapy to consist of cyclophosphamide and docetaxel every 21 days x 4, first dose May 29, 2019  (4) adjuvant radiation to follow  (5) consider prophylactic antiestrogens   PLAN: Markela had some difficulties with the first cycle which I hope we will be able to overcome with subsequent cycles.  She is already set up for IV fluids.  I have given Joanne Hansen a recipe for rehydration solution and she will make this at home and drink it instead of Gatorade.  Goal is 2 quarts a day.  I have written a prescription for Norco for Joanne Hansen in case she  again has significant pain from the OnPro and I have also refilled Joanne Hansen lorazepam and instructed Joanne Hansen to take it only on days when she receives steroids.  Hopefully the simple changes will make a big difference and she will be more comfortable with this in the last 2 cycles  She knows to call for any other problems that may develop before the next visit.  Joanne Hansen Hansen. Shenelle Klas, MD 06/19/19 9:51 AM Medical Oncology and Hematology [facility] [place], [place] [address] Tel. [phone]    Fax. [phone]    I, Wilburn Mylar, am acting as scribe for Dr. Virgie Hansen. Joanne Hansen Hansen.  I, Lurline Del MD, have reviewed the above documentation for accuracy and completeness, and I agree with the above.

## 2019-06-19 ENCOUNTER — Inpatient Hospital Stay (HOSPITAL_BASED_OUTPATIENT_CLINIC_OR_DEPARTMENT_OTHER): Payer: Medicare Other | Admitting: Oncology

## 2019-06-19 ENCOUNTER — Inpatient Hospital Stay: Payer: Medicare Other

## 2019-06-19 ENCOUNTER — Other Ambulatory Visit: Payer: Self-pay

## 2019-06-19 VITALS — BP 112/65 | HR 68 | Temp 98.3°F | Resp 18 | Ht 62.0 in | Wt 128.9 lb

## 2019-06-19 DIAGNOSIS — C50211 Malignant neoplasm of upper-inner quadrant of right female breast: Secondary | ICD-10-CM

## 2019-06-19 DIAGNOSIS — Z171 Estrogen receptor negative status [ER-]: Secondary | ICD-10-CM | POA: Diagnosis not present

## 2019-06-19 DIAGNOSIS — C9000 Multiple myeloma not having achieved remission: Secondary | ICD-10-CM

## 2019-06-19 LAB — CBC WITH DIFFERENTIAL/PLATELET
Abs Immature Granulocytes: 0.03 10*3/uL (ref 0.00–0.07)
Basophils Absolute: 0 10*3/uL (ref 0.0–0.1)
Basophils Relative: 0 %
Eosinophils Absolute: 0 10*3/uL (ref 0.0–0.5)
Eosinophils Relative: 0 %
HCT: 35.2 % — ABNORMAL LOW (ref 36.0–46.0)
Hemoglobin: 11.8 g/dL — ABNORMAL LOW (ref 12.0–15.0)
Immature Granulocytes: 0 %
Lymphocytes Relative: 7 %
Lymphs Abs: 0.5 10*3/uL — ABNORMAL LOW (ref 0.7–4.0)
MCH: 32.4 pg (ref 26.0–34.0)
MCHC: 33.5 g/dL (ref 30.0–36.0)
MCV: 96.7 fL (ref 80.0–100.0)
Monocytes Absolute: 0.7 10*3/uL (ref 0.1–1.0)
Monocytes Relative: 9 %
Neutro Abs: 6.1 10*3/uL (ref 1.7–7.7)
Neutrophils Relative %: 84 %
Platelets: 390 10*3/uL (ref 150–400)
RBC: 3.64 MIL/uL — ABNORMAL LOW (ref 3.87–5.11)
RDW: 12.6 % (ref 11.5–15.5)
WBC: 7.3 10*3/uL (ref 4.0–10.5)
nRBC: 0 % (ref 0.0–0.2)

## 2019-06-19 LAB — COMPREHENSIVE METABOLIC PANEL
ALT: 11 U/L (ref 0–44)
AST: 11 U/L — ABNORMAL LOW (ref 15–41)
Albumin: 3.8 g/dL (ref 3.5–5.0)
Alkaline Phosphatase: 81 U/L (ref 38–126)
Anion gap: 10 (ref 5–15)
BUN: 16 mg/dL (ref 8–23)
CO2: 23 mmol/L (ref 22–32)
Calcium: 9.7 mg/dL (ref 8.9–10.3)
Chloride: 110 mmol/L (ref 98–111)
Creatinine, Ser: 0.71 mg/dL (ref 0.44–1.00)
GFR calc Af Amer: >60 mL/min (ref >60)
GFR calc non Af Amer: >60 mL/min (ref >60)
Glucose, Bld: 152 mg/dL — ABNORMAL HIGH (ref 70–99)
Potassium: 3.9 mmol/L (ref 3.5–5.1)
Sodium: 143 mmol/L (ref 135–145)
Total Bilirubin: 0.5 mg/dL (ref 0.3–1.2)
Total Protein: 6.7 g/dL (ref 6.5–8.1)

## 2019-06-19 MED ORDER — SODIUM CHLORIDE 0.9 % IV SOLN
75.0000 mg/m2 | Freq: Once | INTRAVENOUS | Status: AC
  Administered 2019-06-19: 120 mg via INTRAVENOUS
  Filled 2019-06-19: qty 12

## 2019-06-19 MED ORDER — SODIUM CHLORIDE 0.9 % IV SOLN
Freq: Once | INTRAVENOUS | Status: AC
  Administered 2019-06-19: 11:00:00 via INTRAVENOUS
  Filled 2019-06-19: qty 250

## 2019-06-19 MED ORDER — DEXAMETHASONE SODIUM PHOSPHATE 10 MG/ML IJ SOLN
10.0000 mg | Freq: Once | INTRAMUSCULAR | Status: AC
  Administered 2019-06-19: 10 mg via INTRAVENOUS

## 2019-06-19 MED ORDER — PEGFILGRASTIM 6 MG/0.6ML ~~LOC~~ PSKT
6.0000 mg | PREFILLED_SYRINGE | Freq: Once | SUBCUTANEOUS | Status: AC
  Administered 2019-06-19: 6 mg via SUBCUTANEOUS

## 2019-06-19 MED ORDER — PALONOSETRON HCL INJECTION 0.25 MG/5ML
INTRAVENOUS | Status: AC
  Filled 2019-06-19: qty 5

## 2019-06-19 MED ORDER — SODIUM CHLORIDE 0.9 % IV SOLN
600.0000 mg/m2 | Freq: Once | INTRAVENOUS | Status: AC
  Administered 2019-06-19: 960 mg via INTRAVENOUS
  Filled 2019-06-19: qty 48

## 2019-06-19 MED ORDER — SODIUM CHLORIDE 0.9% FLUSH
10.0000 mL | INTRAVENOUS | Status: DC | PRN
  Administered 2019-06-19: 10 mL
  Filled 2019-06-19: qty 10

## 2019-06-19 MED ORDER — HEPARIN SOD (PORK) LOCK FLUSH 100 UNIT/ML IV SOLN
500.0000 [IU] | Freq: Once | INTRAVENOUS | Status: AC | PRN
  Administered 2019-06-19: 500 [IU]
  Filled 2019-06-19: qty 5

## 2019-06-19 MED ORDER — LORAZEPAM 0.5 MG PO TABS: 0.5000 mg | ORAL_TABLET | Freq: Every evening | ORAL | 0 refills | Status: DC | PRN

## 2019-06-19 MED ORDER — DEXAMETHASONE SODIUM PHOSPHATE 10 MG/ML IJ SOLN
INTRAMUSCULAR | Status: AC
  Filled 2019-06-19: qty 1

## 2019-06-19 MED ORDER — PALONOSETRON HCL INJECTION 0.25 MG/5ML
0.2500 mg | Freq: Once | INTRAVENOUS | Status: AC
  Administered 2019-06-19: 0.25 mg via INTRAVENOUS

## 2019-06-19 MED ORDER — SODIUM CHLORIDE 0.9% FLUSH
10.0000 mL | Freq: Once | INTRAVENOUS | Status: AC
  Administered 2019-06-19: 10 mL via INTRAVENOUS
  Filled 2019-06-19: qty 10

## 2019-06-19 MED ORDER — HYDROCODONE-ACETAMINOPHEN 5-325 MG PO TABS: 1.0000 | ORAL_TABLET | Freq: Four times a day (QID) | ORAL | 0 refills | Status: DC | PRN

## 2019-06-19 MED ORDER — PEGFILGRASTIM 6 MG/0.6ML ~~LOC~~ PSKT
PREFILLED_SYRINGE | SUBCUTANEOUS | Status: AC
  Filled 2019-06-19: qty 0.6

## 2019-06-19 NOTE — Patient Instructions (Signed)
[facility] Discharge Instructions for Patients Receiving Chemotherapy  Today you received the following chemotherapy agents Taxotere and Cytoxan  To help prevent nausea and vomiting after your treatment, we encourage you to take your nausea medication as prescribed.  If you develop nausea and vomiting that is not controlled by your nausea medication, call the clinic.   BELOW ARE SYMPTOMS THAT SHOULD BE REPORTED IMMEDIATELY:  *FEVER GREATER THAN 100.5 F  *CHILLS WITH OR WITHOUT FEVER  NAUSEA AND VOMITING THAT IS NOT CONTROLLED WITH YOUR NAUSEA MEDICATION  *UNUSUAL SHORTNESS OF BREATH  *UNUSUAL BRUISING OR BLEEDING  TENDERNESS IN MOUTH AND THROAT WITH OR WITHOUT PRESENCE OF ULCERS  *URINARY PROBLEMS  *BOWEL PROBLEMS  UNUSUAL RASH Items with * indicate a potential emergency and should be followed up as soon as possible.  Feel free to call the clinic should you have any questions or concerns. The clinic phone number is [phone].  Please show the [facility] at check-in to the Emergency Department and triage nurse.  Docetaxel injection(taxotere) What is this medicine? DOCETAXEL (doe se TAX el) is a chemotherapy drug. It targets fast dividing cells, like cancer cells, and causes these cells to die. This medicine is used to treat many types of cancers like breast cancer, certain stomach cancers, head and neck cancer, lung cancer, and prostate cancer. This medicine may be used for other purposes; ask your health care provider or pharmacist if you have questions. COMMON BRAND NAME(S): Docefrez, Taxotere What should I tell my health care provider before I take this medicine? They need to know if you have any of these conditions:  infection (especially a virus infection such as chickenpox, cold sores, or herpes)  liver disease  low blood counts, like low white cell, platelet, or red cell counts  an unusual or allergic reaction to docetaxel, polysorbate  80, other chemotherapy agents, other medicines, foods, dyes, or preservatives  pregnant or trying to get pregnant  breast-feeding How should I use this medicine? This drug is given as an infusion into a vein. It is administered in a hospital or clinic by a specially trained health care professional. Talk to your pediatrician regarding the use of this medicine in children. Special care may be needed. Overdosage: If you think you have taken too much of this medicine contact a poison control center or emergency room at once. NOTE: This medicine is only for you. Do not share this medicine with others. What if I miss a dose? It is important not to miss your dose. Call your doctor or health care professional if you are unable to keep an appointment. What may interact with this medicine?  aprepitant  certain antibiotics like erythromycin or clarithromycin  certain antivirals for HIV or hepatitis  certain medicines for fungal infections like fluconazole, itraconazole, ketoconazole, posaconazole, or voriconazole  cimetidine  ciprofloxacin  conivaptan  cyclosporine  dronedarone  fluvoxamine  grapefruit juice  imatinib  verapamil This list may not describe all possible interactions. Give your health care provider a list of all the medicines, herbs, non-prescription drugs, or dietary supplements you use. Also tell them if you smoke, drink alcohol, or use illegal drugs. Some items may interact with your medicine. What should I watch for while using this medicine? Your condition will be monitored carefully while you are receiving this medicine. You will need important blood work done while you are taking this medicine. Call your doctor or health care professional for advice if you get a fever, chills  or sore throat, or other symptoms of a cold or flu. Do not treat yourself. This drug decreases your body's ability to fight infections. Try to avoid being around people who are sick. Some  products may contain alcohol. Ask your health care professional if this medicine contains alcohol. Be sure to tell all health care professionals you are taking this medicine. Certain medicines, like metronidazole and disulfiram, can cause an unpleasant reaction when taken with alcohol. The reaction includes flushing, headache, nausea, vomiting, sweating, and increased thirst. The reaction can last from 30 minutes to several hours. You may get drowsy or dizzy. Do not drive, use machinery, or do anything that needs mental alertness until you know how this medicine affects you. Do not stand or sit up quickly, especially if you are an older patient. This reduces the risk of dizzy or fainting spells. Alcohol may interfere with the effect of this medicine. Talk to your health care professional about your risk of cancer. You may be more at risk for certain types of cancer if you take this medicine. Do not become pregnant while taking this medicine or for 6 months after stopping it. Women should inform their doctor if they wish to become pregnant or think they might be pregnant. There is a potential for serious side effects to an unborn child. Talk to your health care professional or pharmacist for more information. Do not breast-feed an infant while taking this medicine or for 1 to 2 weeks after stopping it. Males who get this medicine must use a condom during sex with females who can get pregnant. If you get a woman pregnant, the baby could have birth defects. The baby could die before they are born. You will need to continue wearing a condom for 3 months after stopping the medicine. Tell your health care provider right away if your partner becomes pregnant while you are taking this medicine. This may interfere with the ability to father a child. You should talk to your doctor or health care professional if you are concerned about your fertility. What side effects may I notice from receiving this medicine? Side  effects that you should report to your doctor or health care professional as soon as possible:  allergic reactions like skin rash, itching or hives, swelling of the face, lips, or tongue  blurred vision  breathing problems  changes in vision  low blood counts - This drug may decrease the number of white blood cells, red blood cells and platelets. You may be at increased risk for infections and bleeding.  nausea and vomiting  pain, redness or irritation at site where injected  pain, tingling, numbness in the hands or feet  redness, blistering, peeling, or loosening of the skin, including inside the mouth  signs of decreased platelets or bleeding - bruising, pinpoint red spots on the skin, black, tarry stools, nosebleeds  signs of decreased red blood cells - unusually weak or tired, fainting spells, lightheadedness  signs of infection - fever or chills, cough, sore throat, pain or difficulty passing urine  swelling of the ankle, feet, hands Side effects that usually do not require medical attention (report to your doctor or health care professional if they continue or are bothersome):  constipation  diarrhea  fingernail or toenail changes  hair loss  loss of appetite  mouth sores  muscle pain This list may not describe all possible side effects. Call your doctor for medical advice about side effects. You may report side effects to FDA at  1-800-FDA-1088. Where should I keep my medicine? This drug is given in a hospital or clinic and will not be stored at home. NOTE: This sheet is a summary. It may not cover all possible information. If you have questions about this medicine, talk to your doctor, pharmacist, or health care provider.  2020 Elsevier/Gold Standard (2018-10-13 12:23:11)   Cyclophosphamide injection(cytoxan) What is this medicine? CYCLOPHOSPHAMIDE (sye kloe FOSS fa mide) is a chemotherapy drug. It slows the growth of cancer cells. This medicine is used to  treat many types of cancer like lymphoma, myeloma, leukemia, breast cancer, and ovarian cancer, to name a few. This medicine may be used for other purposes; ask your health care provider or pharmacist if you have questions. COMMON BRAND NAME(S): Cytoxan, Neosar What should I tell my health care provider before I take this medicine? They need to know if you have any of these conditions:  blood disorders  history of other chemotherapy  infection  kidney disease  liver disease  recent or ongoing radiation therapy  tumors in the bone marrow  an unusual or allergic reaction to cyclophosphamide, other chemotherapy, other medicines, foods, dyes, or preservatives  pregnant or trying to get pregnant  breast-feeding How should I use this medicine? This drug is usually given as an injection into a vein or muscle or by infusion into a vein. It is administered in a hospital or clinic by a specially trained health care professional. Talk to your pediatrician regarding the use of this medicine in children. Special care may be needed. Overdosage: If you think you have taken too much of this medicine contact a poison control center or emergency room at once. NOTE: This medicine is only for you. Do not share this medicine with others. What if I miss a dose? It is important not to miss your dose. Call your doctor or health care professional if you are unable to keep an appointment. What may interact with this medicine? This medicine may interact with the following medications:  amiodarone  amphotericin B  azathioprine  certain antiviral medicines for HIV or AIDS such as protease inhibitors (e.g., indinavir, ritonavir) and zidovudine  certain blood pressure medications such as benazepril, captopril, enalapril, fosinopril, lisinopril, moexipril, monopril, perindopril, quinapril, ramipril, trandolapril  certain cancer medications such as anthracyclines (e.g., daunorubicin, doxorubicin),  busulfan, cytarabine, paclitaxel, pentostatin, tamoxifen, trastuzumab  certain diuretics such as chlorothiazide, chlorthalidone, hydrochlorothiazide, indapamide, metolazone  certain medicines that treat or prevent blood clots like warfarin  certain muscle relaxants such as succinylcholine  cyclosporine  etanercept  indomethacin  medicines to increase blood counts like filgrastim, pegfilgrastim, sargramostim  medicines used as general anesthesia  metronidazole  natalizumab This list may not describe all possible interactions. Give your health care provider a list of all the medicines, herbs, non-prescription drugs, or dietary supplements you use. Also tell them if you smoke, drink alcohol, or use illegal drugs. Some items may interact with your medicine. What should I watch for while using this medicine? Visit your doctor for checks on your progress. This drug may make you feel generally unwell. This is not uncommon, as chemotherapy can affect healthy cells as well as cancer cells. Report any side effects. Continue your course of treatment even though you feel ill unless your doctor tells you to stop. Drink water or other fluids as directed. Urinate often, even at night. In some cases, you may be given additional medicines to help with side effects. Follow all directions for their use. Call your doctor or  health care professional for advice if you get a fever, chills or sore throat, or other symptoms of a cold or flu. Do not treat yourself. This drug decreases your body's ability to fight infections. Try to avoid being around people who are sick. This medicine may increase your risk to bruise or bleed. Call your doctor or health care professional if you notice any unusual bleeding. Be careful brushing and flossing your teeth or using a toothpick because you may get an infection or bleed more easily. If you have any dental work done, tell your dentist you are receiving this medicine. You  may get drowsy or dizzy. Do not drive, use machinery, or do anything that needs mental alertness until you know how this medicine affects you. Do not become pregnant while taking this medicine or for 1 year after stopping it. Women should inform their doctor if they wish to become pregnant or think they might be pregnant. Men should not father a child while taking this medicine and for 4 months after stopping it. There is a potential for serious side effects to an unborn child. Talk to your health care professional or pharmacist for more information. Do not breast-feed an infant while taking this medicine. This medicine may interfere with the ability to have a child. This medicine has caused ovarian failure in some women. This medicine has caused reduced sperm counts in some men. You should talk with your doctor or health care professional if you are concerned about your fertility. If you are going to have surgery, tell your doctor or health care professional that you have taken this medicine. What side effects may I notice from receiving this medicine? Side effects that you should report to your doctor or health care professional as soon as possible:  allergic reactions like skin rash, itching or hives, swelling of the face, lips, or tongue  low blood counts - this medicine may decrease the number of white blood cells, red blood cells and platelets. You may be at increased risk for infections and bleeding.  signs of infection - fever or chills, cough, sore throat, pain or difficulty passing urine  signs of decreased platelets or bleeding - bruising, pinpoint red spots on the skin, black, tarry stools, blood in the urine  signs of decreased red blood cells - unusually weak or tired, fainting spells, lightheadedness  breathing problems  dark urine  dizziness  palpitations  swelling of the ankles, feet, hands  trouble passing urine or change in the amount of urine  weight gain  yellowing  of the eyes or skin Side effects that usually do not require medical attention (report to your doctor or health care professional if they continue or are bothersome):  changes in nail or skin color  hair loss  missed menstrual periods  mouth sores  nausea, vomiting This list may not describe all possible side effects. Call your doctor for medical advice about side effects. You may report side effects to FDA at 1-800-FDA-1088. Where should I keep my medicine? This drug is given in a hospital or clinic and will not be stored at home. NOTE: This sheet is a summary. It may not cover all possible information. If you have questions about this medicine, talk to your doctor, pharmacist, or health care provider.  2020 Elsevier/Gold Standard (2012-06-23 16:22:58)   Pegfilgrastim injection(neulasta) What is this medicine? PEGFILGRASTIM (PEG fil gra stim) is a long-acting granulocyte colony-stimulating factor that stimulates the growth of neutrophils, a type of white blood  cell important in the body's fight against infection. It is used to reduce the incidence of fever and infection in patients with certain types of cancer who are receiving chemotherapy that affects the bone marrow, and to increase survival after being exposed to high doses of radiation. This medicine may be used for other purposes; ask your health care provider or pharmacist if you have questions. COMMON BRAND NAME(S): Steve Rattler, Ziextenzo What should I tell my health care provider before I take this medicine? They need to know if you have any of these conditions:  kidney disease  latex allergy  ongoing radiation therapy  sickle cell disease  skin reactions to acrylic adhesives (On-Body Injector only)  an unusual or allergic reaction to pegfilgrastim, filgrastim, other medicines, foods, dyes, or preservatives  pregnant or trying to get pregnant  breast-feeding How should I use this medicine? This  medicine is for injection under the skin. If you get this medicine at home, you will be taught how to prepare and give the pre-filled syringe or how to use the On-body Injector. Refer to the patient Instructions for Use for detailed instructions. Use exactly as directed. Tell your healthcare provider immediately if you suspect that the On-body Injector may not have performed as intended or if you suspect the use of the On-body Injector resulted in a missed or partial dose. It is important that you put your used needles and syringes in a special sharps container. Do not put them in a trash can. If you do not have a sharps container, call your pharmacist or healthcare provider to get one. Talk to your pediatrician regarding the use of this medicine in children. While this drug may be prescribed for selected conditions, precautions do apply. Overdosage: If you think you have taken too much of this medicine contact a poison control center or emergency room at once. NOTE: This medicine is only for you. Do not share this medicine with others. What if I miss a dose? It is important not to miss your dose. Call your doctor or health care professional if you miss your dose. If you miss a dose due to an On-body Injector failure or leakage, a new dose should be administered as soon as possible using a single prefilled syringe for manual use. What may interact with this medicine? Interactions have not been studied. Give your health care provider a list of all the medicines, herbs, non-prescription drugs, or dietary supplements you use. Also tell them if you smoke, drink alcohol, or use illegal drugs. Some items may interact with your medicine. This list may not describe all possible interactions. Give your health care provider a list of all the medicines, herbs, non-prescription drugs, or dietary supplements you use. Also tell them if you smoke, drink alcohol, or use illegal drugs. Some items may interact with your  medicine. What should I watch for while using this medicine? You may need blood work done while you are taking this medicine. If you are going to need a MRI, CT scan, or other procedure, tell your doctor that you are using this medicine (On-Body Injector only). What side effects may I notice from receiving this medicine? Side effects that you should report to your doctor or health care professional as soon as possible:  allergic reactions like skin rash, itching or hives, swelling of the face, lips, or tongue  back pain  dizziness  fever  pain, redness, or irritation at site where injected  pinpoint red spots on the skin  red or dark-brown urine  shortness of breath or breathing problems  stomach or side pain, or pain at the shoulder  swelling  tiredness  trouble passing urine or change in the amount of urine Side effects that usually do not require medical attention (report to your doctor or health care professional if they continue or are bothersome):  bone pain  muscle pain This list may not describe all possible side effects. Call your doctor for medical advice about side effects. You may report side effects to FDA at 1-800-FDA-1088. Where should I keep my medicine? Keep out of the reach of children. If you are using this medicine at home, you will be instructed on how to store it. Throw away any unused medicine after the expiration date on the label. NOTE: This sheet is a summary. It may not cover all possible information. If you have questions about this medicine, talk to your doctor, pharmacist, or health care provider.  2020 Elsevier/Gold Standard (2017-11-14 16:57:08)

## 2019-06-19 NOTE — Patient Instructions (Signed)

## 2019-06-22 ENCOUNTER — Other Ambulatory Visit: Payer: Self-pay

## 2019-06-22 ENCOUNTER — Inpatient Hospital Stay: Payer: Medicare Other

## 2019-06-22 VITALS — BP 96/56 | HR 78 | Temp 98.3°F | Resp 16

## 2019-06-22 DIAGNOSIS — Z171 Estrogen receptor negative status [ER-]: Secondary | ICD-10-CM

## 2019-06-22 DIAGNOSIS — C50211 Malignant neoplasm of upper-inner quadrant of right female breast: Secondary | ICD-10-CM

## 2019-06-22 MED ORDER — SODIUM CHLORIDE 0.9% FLUSH
10.0000 mL | INTRAVENOUS | Status: AC | PRN
  Administered 2019-06-22: 10 mL
  Filled 2019-06-22: qty 10

## 2019-06-22 MED ORDER — HEPARIN SOD (PORK) LOCK FLUSH 100 UNIT/ML IV SOLN
500.0000 [IU] | INTRAVENOUS | Status: AC | PRN
  Administered 2019-06-22: 500 [IU]
  Filled 2019-06-22: qty 5

## 2019-06-22 MED ORDER — SODIUM CHLORIDE 0.9 % IV SOLN
INTRAVENOUS | Status: DC
  Administered 2019-06-22: 14:00:00 via INTRAVENOUS
  Filled 2019-06-22 (×2): qty 250

## 2019-06-23 ENCOUNTER — Inpatient Hospital Stay: Payer: Medicare Other

## 2019-06-23 VITALS — BP 109/68 | HR 67 | Temp 98.0°F | Resp 18

## 2019-06-23 DIAGNOSIS — C50211 Malignant neoplasm of upper-inner quadrant of right female breast: Secondary | ICD-10-CM | POA: Diagnosis not present

## 2019-06-23 MED ORDER — SODIUM CHLORIDE 0.9% FLUSH
10.0000 mL | INTRAVENOUS | Status: AC | PRN
  Administered 2019-06-23: 12:00:00 10 mL
  Filled 2019-06-23: qty 10

## 2019-06-23 MED ORDER — SODIUM CHLORIDE 0.9 % IV SOLN
INTRAVENOUS | Status: DC
  Administered 2019-06-23: 10:00:00 via INTRAVENOUS
  Filled 2019-06-23 (×2): qty 250

## 2019-06-23 MED ORDER — HEPARIN SOD (PORK) LOCK FLUSH 100 UNIT/ML IV SOLN
500.0000 [IU] | INTRAVENOUS | Status: AC | PRN
  Administered 2019-06-23: 500 [IU]
  Filled 2019-06-23: qty 5

## 2019-06-23 NOTE — Patient Instructions (Signed)

## 2019-07-02 ENCOUNTER — Other Ambulatory Visit: Payer: Self-pay

## 2019-07-02 ENCOUNTER — Other Ambulatory Visit: Payer: Medicare Other

## 2019-07-02 ENCOUNTER — Encounter: Payer: Self-pay | Admitting: Family Medicine

## 2019-07-02 ENCOUNTER — Ambulatory Visit (INDEPENDENT_AMBULATORY_CARE_PROVIDER_SITE_OTHER): Payer: Medicare Other | Admitting: Family Medicine

## 2019-07-02 VITALS — BP 108/70 | HR 82 | Temp 98.0°F | Ht 62.25 in | Wt 127.7 lb

## 2019-07-02 DIAGNOSIS — R739 Hyperglycemia, unspecified: Secondary | ICD-10-CM

## 2019-07-02 DIAGNOSIS — K621 Rectal polyp: Secondary | ICD-10-CM

## 2019-07-02 DIAGNOSIS — Z8679 Personal history of other diseases of the circulatory system: Secondary | ICD-10-CM

## 2019-07-02 DIAGNOSIS — Z0000 Encounter for general adult medical examination without abnormal findings: Secondary | ICD-10-CM | POA: Diagnosis not present

## 2019-07-02 DIAGNOSIS — E785 Hyperlipidemia, unspecified: Secondary | ICD-10-CM

## 2019-07-02 DIAGNOSIS — Z1159 Encounter for screening for other viral diseases: Secondary | ICD-10-CM

## 2019-07-02 DIAGNOSIS — L719 Rosacea, unspecified: Secondary | ICD-10-CM

## 2019-07-02 LAB — LIPID PANEL
Cholesterol: 159 mg/dL (ref 0–200)
HDL: 54.2 mg/dL (ref >39.00)
LDL Cholesterol: 79 mg/dL (ref 0–99)
NonHDL: 104.71
Total CHOL/HDL Ratio: 3
Triglycerides: 130 mg/dL (ref 0.0–149.0)
VLDL: 26 mg/dL (ref 0.0–40.0)

## 2019-07-02 LAB — TSH: TSH: 1.06 u[IU]/mL (ref 0.35–4.50)

## 2019-07-02 MED ORDER — METRONIDAZOLE 0.75 % EX CREA: TOPICAL_CREAM | Freq: Two times a day (BID) | CUTANEOUS | 0 refills | Status: DC

## 2019-07-02 NOTE — Progress Notes (Signed)
Joanne Hansen DOB: 1946-05-01 Encounter date: 07/02/2019  This is a 73 y.o. female who presents for complete exam/wellness   Initial preventative physical exam  Joanne Hansen is a 73 y.o. female who presents today for her Preventative Physical Exam. She feels fairly well. She reports trying to exercise daily for at least 30 to 60 minutes with walking.  She lives at home with her husband. Hearing and vision screening: Completed, see below.  Patient Care Team: Caren Macadam, MD as PCP - General (Family Medicine) Magrinat, Virgie Dad, MD as Consulting Physician (Oncology) Erroll Luna, MD as Consulting Physician (General Surgery) Kyung Rudd, MD as Consulting Physician (Radiation Oncology) Hayden Pedro, PA-C as Physician Assistant (Radiation Oncology) Leighton Ruff, MD as Consulting Physician (General Surgery) Bjorn Loser, MD as Consulting Physician (Urology)  Depression screen [facility] 2/9 07/02/2019 11/08/2018  Decreased Interest 0 0  Down, Depressed, Hopeless 0 0  PHQ - 2 Score 0 0   Fall Risk  07/02/2019 11/08/2018  Falls in the past year? 0 0  Number falls in past yr: 0 0  Injury with Fall? 0 0  Follow up - Falls evaluation completed   She is independent with all ADLs and a IADLs at home.   She does not have advanced directives in place.  History of present illness/Additional concerns: She is currently following with Dr. Jana Hakim for triple negative invasive ductal carcinoma: they are working with her on side effects of treatment. Right now feels "pretty good". She is about 3 weeks between treatments now. Doc says she is doing well. She has stayed well.   HL: on zocor 40mg  daily; due for lipid recheck  Not sleeping well. Has been an issue on and off in past. In last few years has had issues that kept her from getting comfortable, tossing/turning. Happening again with all of this. Has a bad hip; improved with vegan diet. Recently has been acting up  again. Wakes at night and it hurts. Just taking tylenol. Has hydrocodone, but hasn't taken this yet. Left hip. Was told in past that she would need hip replacement. Pain is in the groin. Feels a little pain down leg.   When Dr. Marcello Moores did cyst removal saw that she had polyp and couldn't deal with this during procedure. Told her to get referral for [facility] so they could do outpatient colonoscopy.  Would like to get established with cardiology. Was due to see hers in [place] this summer, but she was here. Having episodes; not sure if related to not drinking enough water. Has had some dizziness, has had situations with vision getting a little dark on left side. Happened more frequently since everything/treatments started. In [place] she was just seeing cardiology yearly for routine follow up - with EKG regularly. Was being treated for atrial fib. She hasn't noted palpitations associated with symptoms. Hasn't passed out at all. No history of passing out. Sometimes just sitting there when episodes occur. No numbness, tingling, weakness. If on phone for a period of time will get some "weird" feeling in left arm. Not sure if any "meaning" to this.   Has been having some heartburn. Had picked up pepcid to have on hand. Last few days in afternoon has had decaf. She gets heartburn with afternoon coffee (none with morning coffee). Other night made her not feel well. Took pepcid for this yesterday which helped. Last night was better.    Past Medical History:  Diagnosis Date  . Arthritis   .  Family history of brain cancer   . Family history of breast cancer   . Family history of colon cancer   . Fecal incontinence    05-16-2019 per pt only a little leakage but controllable (was in clinical trial for Navos without benefit.)  . Frequent headaches   . History of syncope    pre-syncope  08/ 2015  per pt no issue with this since   . Hyperlipidemia   . Malignant neoplasm of upper-inner quadrant of  right breast in female, estrogen receptor negative [facility]) oncologist-- dr Jana Hakim  dr moody   dx 08/ 2020--  invasive ductal carcinoma,  05-13-2019  s/p right breast lumpectomy   . PAF (paroxysmal atrial fibrillation) ([place]) (05-16-2019   pt from [place], recently moved to [place] to be near daughter, has not established a cardiology yet--  previously seen by dr Lowella Dandy cossu (last office note dated 02-28-2018 scanned in epic)---  echo 04-15-2014  ef 55-60%, G1DD mild AR without stenosis, RVSp 81mmHg  . Rectal cyst   . Thyroid goiter    pt ultrasound in epic 06-04-2014 , nodules, no bx done  . Urinary incontinence, mixed    Past Surgical History:  Procedure Laterality Date  . ABDOMINAL HYSTERECTOMY  1978   w/  RSO  and APPENDECTOMY  . BREAST LUMPECTOMY WITH RADIOACTIVE SEED AND SENTINEL LYMPH NODE BIOPSY Right 05/15/2019   Procedure: RIGHT BREAST LUMPECTOMY WITH RADIOACTIVE SEED AND SENTINEL LYMPH NODE MAPPING;  Surgeon: Erroll Luna, MD;  Location: [place];  Service: General;  Laterality: Right;  . BREAST SURGERY  2011   biospy of right breast  . CATARACT EXTRACTION W/ INTRAOCULAR LENS  IMPLANT, BILATERAL  2004  . FLEXIBLE SIGMOIDOSCOPY N/A 05/18/2019   Procedure: FLEXIBLE SIGMOIDOSCOPY,  TRANSANAL EXCISION inclusion cyst;  Surgeon: Leighton Ruff, MD;  Location: [facility];  Service: General;  Laterality: N/A;  . gluteus medius repair  2017  . LUMBAR LAMINECTOMY  2013   L5 -- S1  . PORTACATH PLACEMENT Right 05/15/2019   Procedure: INSERTION PORT-A-CATH WITH ULTRASOUND;  Surgeon: Erroll Luna, MD;  Location: [place];  Service: General;  Laterality: Right;  . ROTATOR CUFF REPAIR Left 2001   Allergies  Allergen Reactions  . Penicillins Swelling  . Sulfa Antibiotics Nausea And Vomiting   Current Meds  Medication Sig  . Calcium-Phosphorus-Vitamin D (CITRACAL CALCIUM GUMMIES PO) Take 2 each by mouth daily.  . Cyanocobalamin (VITAMIN B 12  PO) Take 1,000 mg by mouth daily. sublingal  . dexamethasone (DECADRON) 4 MG tablet Take 2 tablets (8 mg total) by mouth 2 (two) times daily. Start the day before Taxotere. Then again the day after chemo for 3 days.  Marland Kitchen HYDROcodone-acetaminophen (NORCO) 5-325 MG tablet Take 1 tablet by mouth every 6 (six) hours as needed for moderate pain.  Marland Kitchen lidocaine-prilocaine (EMLA) cream Apply to affected area once  . loratadine (CLARITIN) 10 MG tablet Take 1 tablet (10 mg total) by mouth daily.  Marland Kitchen LORazepam (ATIVAN) 0.5 MG tablet Take 1 tablet (0.5 mg total) by mouth at bedtime as needed (Nausea or vomiting).  . magic mouthwash SOLN Take 5 mLs by mouth 4 (four) times daily as needed for mouth pain.  . methylcellulose oral powder Take by mouth every evening.   . metoprolol tartrate (LOPRESSOR) 25 MG tablet Take 1/2 (one-half) tablet by mouth twice daily  . ondansetron (ZOFRAN) 8 MG tablet Take 1 tablet (8 mg total) by mouth 2 (two) times daily as  needed for refractory nausea / vomiting. Start on day 3 after chemo.  . prochlorperazine (COMPAZINE) 10 MG tablet Take 1 tablet (10 mg total) by mouth every 6 (six) hours as needed (Nausea or vomiting).  . simvastatin (ZOCOR) 40 MG tablet Take 1 tablet (40 mg total) by mouth daily. (Patient taking differently: Take 40 mg by mouth at bedtime. )   Current Facility-Administered Medications for the 07/02/19 encounter (Office Visit) with Caren Macadam, MD  Medication  . ondansetron (ZOFRAN) injection 4 mg   Social History   Tobacco Use  . Smoking status: Never Smoker  . Smokeless tobacco: Never Used  Substance Use Topics  . Alcohol use: Yes   Family History  Problem Relation Age of Onset  . COPD Mother   . Cancer Mother 71       colon  . Arthritis Mother   . Hypertension Mother   . Miscarriages / [place] Mother   . Stroke Mother 15  . Alzheimer's disease Mother 20  . Heart disease Father   . Heart attack Father 75  . Lung disease Father   .  Arthritis Sister   . Breast cancer Sister 73       Lumpectomy  . Diabetes Maternal Grandmother   . Breast cancer Niece 38       Louise's Daughter  . Cervical cancer Niece 10     Review of Systems  Constitutional: Negative for chills, fatigue and fever.  HENT: Sinus pain: metrogel.   Respiratory: Negative for cough, chest tightness, shortness of breath and wheezing.   Cardiovascular: Negative for chest pain, palpitations and leg swelling.  Skin:       Flushing of cheeks    CBC:  Lab Results  Component Value Date   WBC 7.3 06/19/2019   HGB 11.8 (L) 06/19/2019   HGB 13.1 05/07/2019   HCT 35.2 (L) 06/19/2019   MCH 32.4 06/19/2019   MCHC 33.5 06/19/2019   RDW 12.6 06/19/2019   PLT 390 06/19/2019   PLT 240 05/07/2019   CMP: Lab Results  Component Value Date   NA 143 06/19/2019   K 3.9 06/19/2019   CL 110 06/19/2019   CO2 23 06/19/2019   ANIONGAP 10 06/19/2019   GLUCOSE 152 (H) 06/19/2019   BUN 16 06/19/2019   CREATININE 0.71 06/19/2019   CREATININE 0.77 05/07/2019   GFRAA >60 06/19/2019   GFRAA >60 05/07/2019   CALCIUM 9.7 06/19/2019   PROT 6.7 06/19/2019   BILITOT 0.5 06/19/2019   BILITOT 0.5 05/07/2019   ALKPHOS 81 06/19/2019   ALT 11 06/19/2019   ALT 13 05/07/2019   AST 11 (L) 06/19/2019   AST 19 05/07/2019   LIPID: Lab Results  Component Value Date   CHOL 159 07/02/2019   TRIG 130.0 07/02/2019   HDL 54.20 07/02/2019   LDLCALC 79 07/02/2019    Objective:  BP 108/70 (BP Location: Left Arm, Patient Position: Sitting, Cuff Size: Normal)   Pulse 82   Temp 98 F (36.7 C) (Temporal)   Ht 5' 2.25" (1.581 m)   Wt 127 lb 11.2 oz (57.9 kg)   SpO2 98%   BMI 23.17 kg/m   Weight: 127 lb 11.2 oz (57.9 kg)   BP Readings from Last 3 Encounters:  07/02/19 108/70  06/23/19 109/68  06/22/19 (!) 96/56   Wt Readings from Last 3 Encounters:  07/02/19 127 lb 11.2 oz (57.9 kg)  06/19/19 128 lb 14.4 oz (58.5 kg)  06/05/19 122 lb 11.2 oz (55.7  kg)     Physical Exam Constitutional:      General: She is not in acute distress.    Appearance: She is well-developed.  Cardiovascular:     Rate and Rhythm: Normal rate and regular rhythm.     Heart sounds: Normal heart sounds. No murmur. No friction rub.  Pulmonary:     Effort: Pulmonary effort is normal. No respiratory distress.     Breath sounds: Normal breath sounds. No wheezing or rales.  Musculoskeletal:     Right lower leg: No edema.     Left lower leg: No edema.  Skin:    Comments: bilat cheeks flushed, resolving papules  Neurological:     Mental Status: She is alert and oriented to person, place, and time.  Psychiatric:        Behavior: Behavior normal.     Assessment/Plan: Health Maintenance Due  Topic Date Due  . Hepatitis C Screening  [birth year]   Health Maintenance reviewed.  1. Encounter for annual wellness exam in Medicare patient We will give advanced directive information for her to review.  Otherwise she is up-to-date with health screenings and preventative health measures except those mentioned below.  2. Rectal polyp Suggested for her to see gastroenterology from Dr. Marcello Moores. - Ambulatory referral to Gastroenterology  3. History of atrial fibrillation Heart rate has been stable, patient would like to have cardiology for routine following. - Ambulatory referral to Cardiology  4. Encounter for hepatitis C screening test for low risk patient - Hep C Antibody; Future - Hep C Antibody  5. Hyperlipidemia, unspecified hyperlipidemia type - Lipid panel; Future - TSH; Future - Lipid panel - TSH  6. Elevated blood sugar - Hemoglobin A1c; Future - Hemoglobin A1c  7. Rosacea - metroNIDAZOLE (METROCREAM) 0.75 % cream; Apply topically 2 (two) times daily.  Dispense: 45 g; Refill: 0  Return for pending lab results.  Micheline Rough, MD

## 2019-07-02 NOTE — Patient Instructions (Signed)
Consider voltaren gel.

## 2019-07-03 LAB — HEMOGLOBIN A1C
Hgb A1c MFr Bld: 5.6 % of total Hgb (ref <5.7)
Mean Plasma Glucose: 114 (calc)
eAG (mmol/L): 6.3 (calc)

## 2019-07-03 LAB — HEPATITIS C ANTIBODY
Hepatitis C Ab: NONREACTIVE
SIGNAL TO CUT-OFF: 0 (ref <1.00)

## 2019-07-04 ENCOUNTER — Telehealth: Payer: Self-pay

## 2019-07-04 NOTE — Telephone Encounter (Signed)
ROI fax to Douglass Rivers MD for records.

## 2019-07-10 ENCOUNTER — Other Ambulatory Visit: Payer: Self-pay

## 2019-07-10 ENCOUNTER — Inpatient Hospital Stay: Payer: Medicare Other

## 2019-07-10 ENCOUNTER — Other Ambulatory Visit: Payer: Medicare Other

## 2019-07-10 ENCOUNTER — Inpatient Hospital Stay: Payer: Medicare Other | Attending: Oncology

## 2019-07-10 ENCOUNTER — Encounter: Payer: Self-pay | Admitting: Adult Health

## 2019-07-10 ENCOUNTER — Inpatient Hospital Stay (HOSPITAL_BASED_OUTPATIENT_CLINIC_OR_DEPARTMENT_OTHER): Payer: Medicare Other | Admitting: Adult Health

## 2019-07-10 VITALS — BP 112/64 | HR 80 | Temp 97.9°F | Resp 18 | Ht 62.25 in | Wt 130.3 lb

## 2019-07-10 DIAGNOSIS — Z79899 Other long term (current) drug therapy: Secondary | ICD-10-CM | POA: Diagnosis not present

## 2019-07-10 DIAGNOSIS — Z5189 Encounter for other specified aftercare: Secondary | ICD-10-CM | POA: Insufficient documentation

## 2019-07-10 DIAGNOSIS — C50211 Malignant neoplasm of upper-inner quadrant of right female breast: Secondary | ICD-10-CM

## 2019-07-10 DIAGNOSIS — Z171 Estrogen receptor negative status [ER-]: Secondary | ICD-10-CM | POA: Diagnosis not present

## 2019-07-10 DIAGNOSIS — Z5111 Encounter for antineoplastic chemotherapy: Secondary | ICD-10-CM | POA: Insufficient documentation

## 2019-07-10 DIAGNOSIS — Z95828 Presence of other vascular implants and grafts: Secondary | ICD-10-CM

## 2019-07-10 LAB — CBC WITH DIFFERENTIAL/PLATELET
Abs Immature Granulocytes: 0.03 10*3/uL (ref 0.00–0.07)
Basophils Absolute: 0 10*3/uL (ref 0.0–0.1)
Basophils Relative: 0 %
Eosinophils Absolute: 0 10*3/uL (ref 0.0–0.5)
Eosinophils Relative: 0 %
HCT: 33.1 % — ABNORMAL LOW (ref 36.0–46.0)
Hemoglobin: 10.7 g/dL — ABNORMAL LOW (ref 12.0–15.0)
Immature Granulocytes: 0 %
Lymphocytes Relative: 5 %
Lymphs Abs: 0.4 10*3/uL — ABNORMAL LOW (ref 0.7–4.0)
MCH: 32.8 pg (ref 26.0–34.0)
MCHC: 32.3 g/dL (ref 30.0–36.0)
MCV: 101.5 fL — ABNORMAL HIGH (ref 80.0–100.0)
Monocytes Absolute: 0.5 10*3/uL (ref 0.1–1.0)
Monocytes Relative: 6 %
Neutro Abs: 6.9 10*3/uL (ref 1.7–7.7)
Neutrophils Relative %: 89 %
Platelets: 301 10*3/uL (ref 150–400)
RBC: 3.26 MIL/uL — ABNORMAL LOW (ref 3.87–5.11)
RDW: 14.2 % (ref 11.5–15.5)
WBC: 7.8 10*3/uL (ref 4.0–10.5)
nRBC: 0 % (ref 0.0–0.2)

## 2019-07-10 LAB — COMPREHENSIVE METABOLIC PANEL
ALT: 34 U/L (ref 0–44)
AST: 21 U/L (ref 15–41)
Albumin: 3.7 g/dL (ref 3.5–5.0)
Alkaline Phosphatase: 104 U/L (ref 38–126)
Anion gap: 14 (ref 5–15)
BUN: 19 mg/dL (ref 8–23)
CO2: 19 mmol/L — ABNORMAL LOW (ref 22–32)
Calcium: 9.2 mg/dL (ref 8.9–10.3)
Chloride: 110 mmol/L (ref 98–111)
Creatinine, Ser: 0.7 mg/dL (ref 0.44–1.00)
GFR calc Af Amer: >60 mL/min (ref >60)
GFR calc non Af Amer: >60 mL/min (ref >60)
Glucose, Bld: 180 mg/dL — ABNORMAL HIGH (ref 70–99)
Potassium: 3.8 mmol/L (ref 3.5–5.1)
Sodium: 143 mmol/L (ref 135–145)
Total Bilirubin: 0.5 mg/dL (ref 0.3–1.2)
Total Protein: 6.2 g/dL — ABNORMAL LOW (ref 6.5–8.1)

## 2019-07-10 MED ORDER — PEGFILGRASTIM 6 MG/0.6ML ~~LOC~~ PSKT
PREFILLED_SYRINGE | SUBCUTANEOUS | Status: AC
  Filled 2019-07-10: qty 0.6

## 2019-07-10 MED ORDER — LORAZEPAM 0.5 MG PO TABS: 0.5000 mg | ORAL_TABLET | Freq: Every evening | ORAL | 0 refills | Status: DC | PRN

## 2019-07-10 MED ORDER — DEXAMETHASONE SODIUM PHOSPHATE 10 MG/ML IJ SOLN
10.0000 mg | Freq: Once | INTRAMUSCULAR | Status: AC
  Administered 2019-07-10: 10 mg via INTRAVENOUS

## 2019-07-10 MED ORDER — SODIUM CHLORIDE 0.9% FLUSH
10.0000 mL | INTRAVENOUS | Status: DC | PRN
  Administered 2019-07-10: 10 mL
  Filled 2019-07-10: qty 10

## 2019-07-10 MED ORDER — SODIUM CHLORIDE 0.9% FLUSH
10.0000 mL | INTRAVENOUS | Status: DC | PRN
  Administered 2019-07-10: 08:00:00 10 mL via INTRAVENOUS
  Filled 2019-07-10: qty 10

## 2019-07-10 MED ORDER — SODIUM CHLORIDE 0.9 % IV SOLN
600.0000 mg/m2 | Freq: Once | INTRAVENOUS | Status: AC
  Administered 2019-07-10: 960 mg via INTRAVENOUS
  Filled 2019-07-10: qty 48

## 2019-07-10 MED ORDER — DEXAMETHASONE SODIUM PHOSPHATE 10 MG/ML IJ SOLN
INTRAMUSCULAR | Status: AC
  Filled 2019-07-10: qty 1

## 2019-07-10 MED ORDER — SODIUM CHLORIDE 0.9 % IV SOLN
75.0000 mg/m2 | Freq: Once | INTRAVENOUS | Status: AC
  Administered 2019-07-10: 120 mg via INTRAVENOUS
  Filled 2019-07-10: qty 12

## 2019-07-10 MED ORDER — SODIUM CHLORIDE 0.9 % IV SOLN
Freq: Once | INTRAVENOUS | Status: AC
  Administered 2019-07-10: 09:00:00 via INTRAVENOUS
  Filled 2019-07-10: qty 250

## 2019-07-10 MED ORDER — HEPARIN SOD (PORK) LOCK FLUSH 100 UNIT/ML IV SOLN
500.0000 [IU] | Freq: Once | INTRAVENOUS | Status: AC | PRN
  Administered 2019-07-10: 500 [IU]
  Filled 2019-07-10: qty 5

## 2019-07-10 MED ORDER — PEGFILGRASTIM 6 MG/0.6ML ~~LOC~~ PSKT
6.0000 mg | PREFILLED_SYRINGE | Freq: Once | SUBCUTANEOUS | Status: AC
  Administered 2019-07-10: 6 mg via SUBCUTANEOUS

## 2019-07-10 MED ORDER — PALONOSETRON HCL INJECTION 0.25 MG/5ML
INTRAVENOUS | Status: AC
  Filled 2019-07-10: qty 5

## 2019-07-10 MED ORDER — PALONOSETRON HCL INJECTION 0.25 MG/5ML
0.2500 mg | Freq: Once | INTRAVENOUS | Status: AC
  Administered 2019-07-10: 0.25 mg via INTRAVENOUS

## 2019-07-10 NOTE — Patient Instructions (Signed)
[facility] Discharge Instructions for Patients Receiving Chemotherapy  Today you received the following chemotherapy agents Taxotere and Cytoxan  To help prevent nausea and vomiting after your treatment, we encourage you to take your nausea medication as prescribed.  If you develop nausea and vomiting that is not controlled by your nausea medication, call the clinic.   BELOW ARE SYMPTOMS THAT SHOULD BE REPORTED IMMEDIATELY:  *FEVER GREATER THAN 100.5 F  *CHILLS WITH OR WITHOUT FEVER  NAUSEA AND VOMITING THAT IS NOT CONTROLLED WITH YOUR NAUSEA MEDICATION  *UNUSUAL SHORTNESS OF BREATH  *UNUSUAL BRUISING OR BLEEDING  TENDERNESS IN MOUTH AND THROAT WITH OR WITHOUT PRESENCE OF ULCERS  *URINARY PROBLEMS  *BOWEL PROBLEMS  UNUSUAL RASH Items with * indicate a potential emergency and should be followed up as soon as possible.  Feel free to call the clinic should you have any questions or concerns. The clinic phone number is [phone].  Please show the [facility] at check-in to the Emergency Department and triage nurse.  Docetaxel injection(taxotere) What is this medicine? DOCETAXEL (doe se TAX el) is a chemotherapy drug. It targets fast dividing cells, like cancer cells, and causes these cells to die. This medicine is used to treat many types of cancers like breast cancer, certain stomach cancers, head and neck cancer, lung cancer, and prostate cancer. This medicine may be used for other purposes; ask your health care provider or pharmacist if you have questions. COMMON BRAND NAME(S): Docefrez, Taxotere What should I tell my health care provider before I take this medicine? They need to know if you have any of these conditions:  infection (especially a virus infection such as chickenpox, cold sores, or herpes)  liver disease  low blood counts, like low white cell, platelet, or red cell counts  an unusual or allergic reaction to docetaxel, polysorbate  80, other chemotherapy agents, other medicines, foods, dyes, or preservatives  pregnant or trying to get pregnant  breast-feeding How should I use this medicine? This drug is given as an infusion into a vein. It is administered in a hospital or clinic by a specially trained health care professional. Talk to your pediatrician regarding the use of this medicine in children. Special care may be needed. Overdosage: If you think you have taken too much of this medicine contact a poison control center or emergency room at once. NOTE: This medicine is only for you. Do not share this medicine with others. What if I miss a dose? It is important not to miss your dose. Call your doctor or health care professional if you are unable to keep an appointment. What may interact with this medicine?  aprepitant  certain antibiotics like erythromycin or clarithromycin  certain antivirals for HIV or hepatitis  certain medicines for fungal infections like fluconazole, itraconazole, ketoconazole, posaconazole, or voriconazole  cimetidine  ciprofloxacin  conivaptan  cyclosporine  dronedarone  fluvoxamine  grapefruit juice  imatinib  verapamil This list may not describe all possible interactions. Give your health care provider a list of all the medicines, herbs, non-prescription drugs, or dietary supplements you use. Also tell them if you smoke, drink alcohol, or use illegal drugs. Some items may interact with your medicine. What should I watch for while using this medicine? Your condition will be monitored carefully while you are receiving this medicine. You will need important blood work done while you are taking this medicine. Call your doctor or health care professional for advice if you get a fever, chills  or sore throat, or other symptoms of a cold or flu. Do not treat yourself. This drug decreases your body's ability to fight infections. Try to avoid being around people who are sick. Some  products may contain alcohol. Ask your health care professional if this medicine contains alcohol. Be sure to tell all health care professionals you are taking this medicine. Certain medicines, like metronidazole and disulfiram, can cause an unpleasant reaction when taken with alcohol. The reaction includes flushing, headache, nausea, vomiting, sweating, and increased thirst. The reaction can last from 30 minutes to several hours. You may get drowsy or dizzy. Do not drive, use machinery, or do anything that needs mental alertness until you know how this medicine affects you. Do not stand or sit up quickly, especially if you are an older patient. This reduces the risk of dizzy or fainting spells. Alcohol may interfere with the effect of this medicine. Talk to your health care professional about your risk of cancer. You may be more at risk for certain types of cancer if you take this medicine. Do not become pregnant while taking this medicine or for 6 months after stopping it. Women should inform their doctor if they wish to become pregnant or think they might be pregnant. There is a potential for serious side effects to an unborn child. Talk to your health care professional or pharmacist for more information. Do not breast-feed an infant while taking this medicine or for 1 to 2 weeks after stopping it. Males who get this medicine must use a condom during sex with females who can get pregnant. If you get a woman pregnant, the baby could have birth defects. The baby could die before they are born. You will need to continue wearing a condom for 3 months after stopping the medicine. Tell your health care provider right away if your partner becomes pregnant while you are taking this medicine. This may interfere with the ability to father a child. You should talk to your doctor or health care professional if you are concerned about your fertility. What side effects may I notice from receiving this medicine? Side  effects that you should report to your doctor or health care professional as soon as possible:  allergic reactions like skin rash, itching or hives, swelling of the face, lips, or tongue  blurred vision  breathing problems  changes in vision  low blood counts - This drug may decrease the number of white blood cells, red blood cells and platelets. You may be at increased risk for infections and bleeding.  nausea and vomiting  pain, redness or irritation at site where injected  pain, tingling, numbness in the hands or feet  redness, blistering, peeling, or loosening of the skin, including inside the mouth  signs of decreased platelets or bleeding - bruising, pinpoint red spots on the skin, black, tarry stools, nosebleeds  signs of decreased red blood cells - unusually weak or tired, fainting spells, lightheadedness  signs of infection - fever or chills, cough, sore throat, pain or difficulty passing urine  swelling of the ankle, feet, hands Side effects that usually do not require medical attention (report to your doctor or health care professional if they continue or are bothersome):  constipation  diarrhea  fingernail or toenail changes  hair loss  loss of appetite  mouth sores  muscle pain This list may not describe all possible side effects. Call your doctor for medical advice about side effects. You may report side effects to FDA at  1-800-FDA-1088. Where should I keep my medicine? This drug is given in a hospital or clinic and will not be stored at home. NOTE: This sheet is a summary. It may not cover all possible information. If you have questions about this medicine, talk to your doctor, pharmacist, or health care provider.  2020 Elsevier/Gold Standard (2018-10-13 12:23:11)   Cyclophosphamide injection(cytoxan) What is this medicine? CYCLOPHOSPHAMIDE (sye kloe FOSS fa mide) is a chemotherapy drug. It slows the growth of cancer cells. This medicine is used to  treat many types of cancer like lymphoma, myeloma, leukemia, breast cancer, and ovarian cancer, to name a few. This medicine may be used for other purposes; ask your health care provider or pharmacist if you have questions. COMMON BRAND NAME(S): Cytoxan, Neosar What should I tell my health care provider before I take this medicine? They need to know if you have any of these conditions:  blood disorders  history of other chemotherapy  infection  kidney disease  liver disease  recent or ongoing radiation therapy  tumors in the bone marrow  an unusual or allergic reaction to cyclophosphamide, other chemotherapy, other medicines, foods, dyes, or preservatives  pregnant or trying to get pregnant  breast-feeding How should I use this medicine? This drug is usually given as an injection into a vein or muscle or by infusion into a vein. It is administered in a hospital or clinic by a specially trained health care professional. Talk to your pediatrician regarding the use of this medicine in children. Special care may be needed. Overdosage: If you think you have taken too much of this medicine contact a poison control center or emergency room at once. NOTE: This medicine is only for you. Do not share this medicine with others. What if I miss a dose? It is important not to miss your dose. Call your doctor or health care professional if you are unable to keep an appointment. What may interact with this medicine? This medicine may interact with the following medications:  amiodarone  amphotericin B  azathioprine  certain antiviral medicines for HIV or AIDS such as protease inhibitors (e.g., indinavir, ritonavir) and zidovudine  certain blood pressure medications such as benazepril, captopril, enalapril, fosinopril, lisinopril, moexipril, monopril, perindopril, quinapril, ramipril, trandolapril  certain cancer medications such as anthracyclines (e.g., daunorubicin, doxorubicin),  busulfan, cytarabine, paclitaxel, pentostatin, tamoxifen, trastuzumab  certain diuretics such as chlorothiazide, chlorthalidone, hydrochlorothiazide, indapamide, metolazone  certain medicines that treat or prevent blood clots like warfarin  certain muscle relaxants such as succinylcholine  cyclosporine  etanercept  indomethacin  medicines to increase blood counts like filgrastim, pegfilgrastim, sargramostim  medicines used as general anesthesia  metronidazole  natalizumab This list may not describe all possible interactions. Give your health care provider a list of all the medicines, herbs, non-prescription drugs, or dietary supplements you use. Also tell them if you smoke, drink alcohol, or use illegal drugs. Some items may interact with your medicine. What should I watch for while using this medicine? Visit your doctor for checks on your progress. This drug may make you feel generally unwell. This is not uncommon, as chemotherapy can affect healthy cells as well as cancer cells. Report any side effects. Continue your course of treatment even though you feel ill unless your doctor tells you to stop. Drink water or other fluids as directed. Urinate often, even at night. In some cases, you may be given additional medicines to help with side effects. Follow all directions for their use. Call your doctor or  health care professional for advice if you get a fever, chills or sore throat, or other symptoms of a cold or flu. Do not treat yourself. This drug decreases your body's ability to fight infections. Try to avoid being around people who are sick. This medicine may increase your risk to bruise or bleed. Call your doctor or health care professional if you notice any unusual bleeding. Be careful brushing and flossing your teeth or using a toothpick because you may get an infection or bleed more easily. If you have any dental work done, tell your dentist you are receiving this medicine. You  may get drowsy or dizzy. Do not drive, use machinery, or do anything that needs mental alertness until you know how this medicine affects you. Do not become pregnant while taking this medicine or for 1 year after stopping it. Women should inform their doctor if they wish to become pregnant or think they might be pregnant. Men should not father a child while taking this medicine and for 4 months after stopping it. There is a potential for serious side effects to an unborn child. Talk to your health care professional or pharmacist for more information. Do not breast-feed an infant while taking this medicine. This medicine may interfere with the ability to have a child. This medicine has caused ovarian failure in some women. This medicine has caused reduced sperm counts in some men. You should talk with your doctor or health care professional if you are concerned about your fertility. If you are going to have surgery, tell your doctor or health care professional that you have taken this medicine. What side effects may I notice from receiving this medicine? Side effects that you should report to your doctor or health care professional as soon as possible:  allergic reactions like skin rash, itching or hives, swelling of the face, lips, or tongue  low blood counts - this medicine may decrease the number of white blood cells, red blood cells and platelets. You may be at increased risk for infections and bleeding.  signs of infection - fever or chills, cough, sore throat, pain or difficulty passing urine  signs of decreased platelets or bleeding - bruising, pinpoint red spots on the skin, black, tarry stools, blood in the urine  signs of decreased red blood cells - unusually weak or tired, fainting spells, lightheadedness  breathing problems  dark urine  dizziness  palpitations  swelling of the ankles, feet, hands  trouble passing urine or change in the amount of urine  weight gain  yellowing  of the eyes or skin Side effects that usually do not require medical attention (report to your doctor or health care professional if they continue or are bothersome):  changes in nail or skin color  hair loss  missed menstrual periods  mouth sores  nausea, vomiting This list may not describe all possible side effects. Call your doctor for medical advice about side effects. You may report side effects to FDA at 1-800-FDA-1088. Where should I keep my medicine? This drug is given in a hospital or clinic and will not be stored at home. NOTE: This sheet is a summary. It may not cover all possible information. If you have questions about this medicine, talk to your doctor, pharmacist, or health care provider.  2020 Elsevier/Gold Standard (2012-06-23 16:22:58)   Pegfilgrastim injection(neulasta) What is this medicine? PEGFILGRASTIM (PEG fil gra stim) is a long-acting granulocyte colony-stimulating factor that stimulates the growth of neutrophils, a type of white blood  cell important in the body's fight against infection. It is used to reduce the incidence of fever and infection in patients with certain types of cancer who are receiving chemotherapy that affects the bone marrow, and to increase survival after being exposed to high doses of radiation. This medicine may be used for other purposes; ask your health care provider or pharmacist if you have questions. COMMON BRAND NAME(S): Steve Rattler, Ziextenzo What should I tell my health care provider before I take this medicine? They need to know if you have any of these conditions:  kidney disease  latex allergy  ongoing radiation therapy  sickle cell disease  skin reactions to acrylic adhesives (On-Body Injector only)  an unusual or allergic reaction to pegfilgrastim, filgrastim, other medicines, foods, dyes, or preservatives  pregnant or trying to get pregnant  breast-feeding How should I use this medicine? This  medicine is for injection under the skin. If you get this medicine at home, you will be taught how to prepare and give the pre-filled syringe or how to use the On-body Injector. Refer to the patient Instructions for Use for detailed instructions. Use exactly as directed. Tell your healthcare provider immediately if you suspect that the On-body Injector may not have performed as intended or if you suspect the use of the On-body Injector resulted in a missed or partial dose. It is important that you put your used needles and syringes in a special sharps container. Do not put them in a trash can. If you do not have a sharps container, call your pharmacist or healthcare provider to get one. Talk to your pediatrician regarding the use of this medicine in children. While this drug may be prescribed for selected conditions, precautions do apply. Overdosage: If you think you have taken too much of this medicine contact a poison control center or emergency room at once. NOTE: This medicine is only for you. Do not share this medicine with others. What if I miss a dose? It is important not to miss your dose. Call your doctor or health care professional if you miss your dose. If you miss a dose due to an On-body Injector failure or leakage, a new dose should be administered as soon as possible using a single prefilled syringe for manual use. What may interact with this medicine? Interactions have not been studied. Give your health care provider a list of all the medicines, herbs, non-prescription drugs, or dietary supplements you use. Also tell them if you smoke, drink alcohol, or use illegal drugs. Some items may interact with your medicine. This list may not describe all possible interactions. Give your health care provider a list of all the medicines, herbs, non-prescription drugs, or dietary supplements you use. Also tell them if you smoke, drink alcohol, or use illegal drugs. Some items may interact with your  medicine. What should I watch for while using this medicine? You may need blood work done while you are taking this medicine. If you are going to need a MRI, CT scan, or other procedure, tell your doctor that you are using this medicine (On-Body Injector only). What side effects may I notice from receiving this medicine? Side effects that you should report to your doctor or health care professional as soon as possible:  allergic reactions like skin rash, itching or hives, swelling of the face, lips, or tongue  back pain  dizziness  fever  pain, redness, or irritation at site where injected  pinpoint red spots on the skin  red or dark-brown urine  shortness of breath or breathing problems  stomach or side pain, or pain at the shoulder  swelling  tiredness  trouble passing urine or change in the amount of urine Side effects that usually do not require medical attention (report to your doctor or health care professional if they continue or are bothersome):  bone pain  muscle pain This list may not describe all possible side effects. Call your doctor for medical advice about side effects. You may report side effects to FDA at 1-800-FDA-1088. Where should I keep my medicine? Keep out of the reach of children. If you are using this medicine at home, you will be instructed on how to store it. Throw away any unused medicine after the expiration date on the label. NOTE: This sheet is a summary. It may not cover all possible information. If you have questions about this medicine, talk to your doctor, pharmacist, or health care provider.  2020 Elsevier/Gold Standard (2017-11-14 16:57:08)

## 2019-07-10 NOTE — Progress Notes (Signed)
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   She was started on adjuvant chemotherapy with Docetaxel and Cyclophosphamide given on day 1 of a 21 day cycle x 4.  She is currently cycle 3 day 1 of treatment.  She receives Neulasta in the form of Onpro.    She notes that she receives IV fluids on the Friday and Saturday following chemotherapy and this helps to prevent the dehydration that she suffered from after her initial treatment.     REVIEW OF SYSTEMS: Carlotta is doing quite well.  She notes that she is exercising by continuing to walk and she tries to remain as active as she was prior to treatment.  She still has some left hip pain that is slightly worse and notes that she is taking 1/2 hydrocodone at night for this.  She has some left foot swelling, and notes she is overdue to see cardiology.  She needs a refill on her Lorazepam.  She does have some sleep changes.  She also had some redness on her scalp and switched to a dove soap bar which helped clear it up.    She denies any new issues such as fever, chills, peripheral neuropathy, mouth sores, bowel/bladder changes, nausea, vomiting, chest pain,  palpitations, cough, or shortness of breath.  A detailed ROS was otherwise non contributory.     HISTORY OF CURRENT ILLNESS: From the original intake note:  ABBAGALE GOGUEN had routine screening mammography on 04/10/2019 showing a possible abnormality in the right breast. She underwent unilateral right diagnostic mammography with tomography and right breast ultrasonography at [facility] on 04/12/2019 showing: Breast Density Category B. Spot compression views of the medial right breast, including tomography confirm an irregular mass spanning approximately 0.9 cm. On physical exam, no mass is palpated in the upper inner quadrant of the right breast. Targeted ultrasound is performed, showing a single irregular mass versus two immediately adjacent small irregular masses, that in total measure 1.1 cm. Individually, the two irregular masses measure 0.8 x 0.4 x 0.5 cm and 0.4 x 0.4 x 0.4 cm, respectively. Mass(es) are at 1 o'clock position 3 cm from the nipple. Ultrasound of the right axilla is negative for lymphadenopathy.  Accordingly on 04/16/2019 she proceeded to biopsy of the right breast area in question. The pathology from this procedure showed ([id number]): invasive ductal carcinoma, grade II. Prognostic indicators significant for: estrogen receptor, 0% negative and progesterone receptor, 0% negative,. Proliferation marker Ki67 at 20%. HER2 negative (1+) by immunohistochemistry.  The patient's subsequent history is as detailed below.   PAST MEDICAL HISTORY: Past Medical History:  Diagnosis Date   Arthritis    Family history of brain cancer    Family history of breast cancer    Family history of colon cancer    Fecal incontinence    05-16-2019 per pt only  a little leakage but controllable (was in clinical trial for [facility] without benefit.)   Frequent headaches    History of syncope    pre-syncope  08/ 2015  per pt no issue with this since    Hyperlipidemia    Malignant  neoplasm of upper-inner quadrant of right breast in female, estrogen receptor negative [facility]) oncologist-- dr Jana Hakim  dr moody   dx 08/ 2020--  invasive ductal carcinoma,  05-13-2019  s/p right breast lumpectomy    PAF (paroxysmal atrial fibrillation) ([facility]) (05-16-2019   pt from [place], recently moved to [place] to be near daughter, has not established a cardiology yet--  previously seen by dr Lowella Dandy cossu (last office note dated 02-28-2018 scanned in epic)---  echo 04-15-2014  ef 55-60%, G1DD mild AR without stenosis, RVSp 23mHg   Rectal cyst    Thyroid goiter    pt ultrasound in epic 06-04-2014 , nodules, no bx done   Urinary incontinence, mixed      PAST SURGICAL HISTORY: Past Surgical History:  Procedure Laterality Date   ABDOMINAL HYSTERECTOMY  1978   w/  RSO  and APPENDECTOMY   BREAST LUMPECTOMY WITH RADIOACTIVE SEED AND SENTINEL LYMPH NODE BIOPSY Right 05/15/2019   Procedure: RIGHT BREAST LUMPECTOMY WITH RADIOACTIVE SEED AND [place]INEL [place]  Surgeon: CErroll Luna MD;  [place] [facility]  Service: General;  Laterality: Right;   BREAST SURGERY  2011   biospy of right breast   CATARACT EXTRACTION W/ INTRAOCULAR LENS  IMPLANT, BILATERAL  2004   FLEXIBLE SIGMOIDOSCOPY N/A 05/18/2019   Procedure: FLEXIBLE SIGMOIDOSCOPY,  TRANSANAL EXCISION inclusion cyst;  Surgeon: TLeighton Ruff MD;  [place] [facility]  Service: General;  Laterality: N/A;   gluteus medius repair  2017   LUMBAR LAMINECTOMY  2013   L5 -- S1   PORTACATH PLACEMENT Right 05/15/2019   Procedure: INSERTION PORT-A-CATH WITH ULTRASOUND;  Surgeon: CErroll Luna MD;  [place] [facility]  Service: General;  Laterality: Right;   ROTATOR CUFF REPAIR Left 2001     FAMILY HISTORY: Family History  Problem Relation Age of Onset   COPD Mother    Cancer Mother 661      colon   Arthritis Mother    Hypertension Mother    Miscarriages /  [place]    Stroke Mother 716  Alzheimer's disease Mother 86  Heart disease Father    Heart attack Father 228  Lung disease Father    Arthritis Sister    Breast cancer Sister 712      Lumpectomy   Diabetes Maternal Grandmother    Breast cancer Niece 339      Louise's Daughter   Cervical cancer Niece 43  The patient's father died at age 41586from a heart attack.  The patient's mother died at age 71 she had a gastrointestinal cancer diagnosed age 71  The patient has 1 sister with breast cancer and one niece with breast cancer diagnosed at age 71   GYNECOLOGIC HISTORY:  No LMP recorded. Patient has had a hysterectomy. 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  Her husband GGermain Osgood(goes by "GRonald Pippins) is a retired eChief Financial Officer  Their daughter KJohnanna Schneiders(currently divorced) lives  in [place].  The patient has no grandchildren.  She is not a church attender   ADVANCED DIRECTIVES: The patient's husband is her healthcare power of attorney   HEALTH MAINTENANCE: Social History   Tobacco Use   Smoking status: Never Smoker   Smokeless tobacco: Never Used  Substance Use Topics   Alcohol use: Yes   Drug use: Yes    Types: Amphetamines    Colonoscopy: Due 2022  PAP: Status post hysterectomy  Bone density: Pending   Allergies  Allergen Reactions   Penicillins Swelling   Sulfa Antibiotics Nausea And Vomiting    Current Outpatient Medications  Medication Sig Dispense Refill   Calcium-Phosphorus-Vitamin D (CITRACAL CALCIUM GUMMIES PO) Take 2 each by mouth daily.     Cyanocobalamin (VITAMIN B 12 PO) Take 1,000 mg by mouth daily. sublingal     dexamethasone (DECADRON) 4 MG tablet Take 2 tablets (8 mg total) by  mouth 2 (two) times daily. Start the day before Taxotere. Then again the day after chemo for 3 days. 30 tablet 1   HYDROcodone-acetaminophen (NORCO) 5-325 MG tablet Take 1 tablet by mouth every 6 (six) hours as needed for moderate pain. 30 tablet 0   lidocaine-prilocaine (EMLA) cream Apply to affected area once 30 g 3   loratadine (CLARITIN) 10 MG tablet Take 1 tablet (10 mg total) by mouth daily. 90 tablet 0   LORazepam (ATIVAN) 0.5 MG tablet Take 1 tablet (0.5 mg total) by mouth at bedtime as needed (Nausea or vomiting). 30 tablet 0   magic mouthwash SOLN Take 5 mLs by mouth 4 (four) times daily as needed for mouth pain. 240 mL 0   methylcellulose oral powder Take by mouth every evening.      metoprolol tartrate (LOPRESSOR) 25 MG tablet Take 1/2 (one-half) tablet by mouth twice daily 90 tablet 0   metroNIDAZOLE (METROCREAM) 0.75 % cream Apply topically 2 (two) times daily. 45 g 0   ondansetron (ZOFRAN) 8 MG tablet Take 1 tablet (8 mg total) by mouth 2 (two) times daily as needed for refractory nausea / vomiting. Start on day 3 after chemo. 30 tablet 1   prochlorperazine (COMPAZINE) 10 MG tablet Take 1 tablet (10 mg total) by mouth every 6 (six) hours as needed (Nausea or vomiting). 30 tablet 1   simvastatin (ZOCOR) 40 MG tablet Take 1 tablet (40 mg total) by mouth daily. (Patient taking differently: Take 40 mg by mouth at bedtime. ) 90 tablet 1   Current Facility-Administered Medications  Medication Dose Route Frequency Provider Last Rate Last Dose   ondansetron (ZOFRAN) injection 4 mg  4 mg Intravenous Once PRN Magrinat, Virgie Dad, MD         OBJECTIVE: Middle-aged white woman who appears stated age  Vitals:   07/10/19 0827  BP: 112/64  Pulse: 80  Resp: 18  Temp: 97.9 F (36.6 C)  SpO2: 100%   Wt Readings from Last 3 Encounters:  07/10/19 130 lb 4.8 oz (59.1 kg)  07/02/19 127 lb 11.2 oz (57.9 kg)  06/19/19 128 lb 14.4 oz (58.5 kg)   Body mass index is 23.64 kg/m.     ECOG FS:1 - Symptomatic but completely ambulatory  GENERAL: Patient is a well appearing female in no acute distress HEENT:  Sclerae anicteric.  Oropharynx clear and moist. No ulcerations or evidence of oropharyngeal candidiasis. Neck is supple.  NODES:  No cervical, supraclavicular, or axillary lymphadenopathy palpated.  BREAST EXAM:  Deferred. LUNGS:  Clear to auscultation bilaterally.  No wheezes or rhonchi. HEART:  Regular rate and rhythm. No murmur appreciated. ABDOMEN:  Soft, nontender.  Positive, normoactive bowel sounds. No organomegaly palpated. MSK:  No focal spinal tenderness to palpation. Full range of motion bilaterally in the upper extremities. EXTREMITIES: scant left pedal edema SKIN:  Clear with no obvious rashes or skin changes. No nail dyscrasia. Scalp unremarkable NEURO:  Nonfocal. Well oriented.  Appropriate affect.     LAB RESULTS:  CMP     Component Value Date/Time   NA 143 06/19/2019 0900   K 3.9 06/19/2019 0900   CL 110 06/19/2019 0900   CO2 23 06/19/2019 0900   GLUCOSE 152 (H) 06/19/2019 0900   BUN 16 06/19/2019 0900   CREATININE 0.71 06/19/2019 0900   CREATININE 0.77 05/07/2019 1547   CALCIUM 9.7 06/19/2019 0900   PROT 6.7 06/19/2019 0900   ALBUMIN 3.8 06/19/2019 0900   AST 11 (L) 06/19/2019 0900   AST 19 05/07/2019 1547   ALT 11 06/19/2019 0900   ALT 13 05/07/2019 1547   ALKPHOS 81 06/19/2019 0900   BILITOT 0.5 06/19/2019 0900   BILITOT 0.5 05/07/2019 1547   GFRNONAA >60 06/19/2019 0900   GFRNONAA >60 05/07/2019 1547   GFRAA >60 06/19/2019 0900   GFRAA >60 05/07/2019 1547    No results found for: TOTALPROTELP, ALBUMINELP, A1GS, A2GS, BETS, BETA2SER, GAMS, MSPIKE, SPEI  No results found for: KPAFRELGTCHN, LAMBDASER, KAPLAMBRATIO  Lab Results  Component Value Date   WBC 7.8 07/10/2019   NEUTROABS 6.9 07/10/2019   HGB 10.7 (L) 07/10/2019   HCT 33.1 (L) 07/10/2019   MCV 101.5 (H) 07/10/2019   PLT 301 07/10/2019    No results found  for: LABCA2  No components found for: [id number]  No results for input(s): INR in the last 168 hours.  No results found for: LABCA2  No results found for: JJK093  No results found for: GHW299  No results found for: BZJ696  No results found for: CA2729  No components found for: HGQUANT  No results found for: CEA1 / No results found for: CEA1   No results found for: AFPTUMOR  No results found for: CHROMOGRNA  No results found for: PSA1  Appointment on 07/10/2019  Component Date Value Ref Range Status   WBC 07/10/2019 7.8  4.0 - 10.5 K/uL Final   RBC 07/10/2019 3.26* 3.87 - 5.11 MIL/uL Final   Hemoglobin 07/10/2019 10.7* 12.0 - 15.0 g/dL Final   HCT 07/10/2019 33.1* 36.0 - 46.0 % Final   MCV 07/10/2019 101.5* 80.0 - 100.0 fL Final   MCH 07/10/2019 32.8  26.0 - 34.0 pg Final   MCHC 07/10/2019 32.3  30.0 - 36.0 g/dL Final   RDW 07/10/2019 14.2  11.5 - 15.5 % Final   Platelets 07/10/2019 301  150 - 400 K/uL Final   nRBC 07/10/2019 0.0  0.0 - 0.2 % Final   Neutrophils Relative % 07/10/2019 89  % Final   Neutro Abs 07/10/2019 6.9  1.7 - 7.7 K/uL Final   Lymphocytes Relative 07/10/2019 5  % Final   Lymphs Abs 07/10/2019 0.4* 0.7 - 4.0 K/uL Final   Monocytes Relative 07/10/2019 6  % Final   Monocytes Absolute 07/10/2019 0.5  0.1 - 1.0 K/uL Final   Eosinophils Relative 07/10/2019 0  % Final   Eosinophils Absolute 07/10/2019 0.0  0.0 - 0.5 K/uL Final   Basophils Relative 07/10/2019 0  % Final   Basophils Absolute 07/10/2019 0.0  0.0 - 0.1 K/uL Final  Immature Granulocytes 07/10/2019 0  % Final   Abs Immature Granulocytes 07/10/2019 0.03  0.00 - 0.07 K/uL Final   Performed at [facility], [place] [address], [place], [place] [address]    (this displays the last labs from the last 3 days)  No results found for: TOTALPROTELP, ALBUMINELP, A1GS, A2GS, BETS, BETA2SER, GAMS, MSPIKE, SPEI (this displays SPEP labs)  No results found for:  KPAFRELGTCHN, LAMBDASER, KAPLAMBRATIO (kappa/lambda light chains)  No results found for: HGBA, HGBA2QUANT, HGBFQUANT, HGBSQUAN (Hemoglobinopathy evaluation)   No results found for: LDH  No results found for: IRON, TIBC, IRONPCTSAT (Iron and TIBC)  No results found for: FERRITIN  Urinalysis No results found for: COLORURINE, APPEARANCEUR, LABSPEC, PHURINE, GLUCOSEU, HGBUR, BILIRUBINUR, KETONESUR, PROTEINUR, UROBILINOGEN, NITRITE, LEUKOCYTESUR   STUDIES:  No results found.   ELIGIBLE FOR AVAILABLE RESEARCH PROTOCOL: NO   ASSESSMENT: 73 y.o. [place], [place] woman status post right breast upper quadrant biopsy for an mT1b (or single t1c0 invasive ductal carcinoma, grade 2, triple negative, with an MIB-1-1 of 20%.  (1) Genetic testing on 05/07/2019 through the [facility] Breast Cancer STAT Panel + Common Hereditary cancer panel found no pathogenic mutations. The STAT Breast cancer panel offered by [facility] includes sequencing and rearrangement analysis for the following 9 genes:  ATM, BRCA1, BRCA2, CDH1, CHEK2, PALB2, PTEN, STK11 and TP53.  The Common Hereditary Cancers Panel offered by [facility] includes sequencing and/or deletion duplication testing of the following 47 genes: APC, ATM, AXIN2, BARD1, BMPR1A, BRCA1, BRCA2, BRIP1, CDH1, CDKN2A (p14ARF), CDKN2A (p16INK4a), CKD4, CHEK2, CTNNA1, DICER1, EPCAM (Deletion/duplication testing only), GREM1 (promoter region deletion/duplication testing only), KIT, MEN1, MLH1, MSH2, MSH3, MSH6, MUTYH, NBN, NF1, NHTL1, PALB2, PDGFRA, PMS2, POLD1, POLE, PTEN, RAD50, RAD51C, RAD51D, SDHB, SDHC, SDHD, SMAD4, SMARCA4. STK11, TP53, TSC1, TSC2, and VHL.  The following genes were evaluated for sequence changes only: SDHA and HOXB13 c.251G>A variant only.  (2) s/p right lumpectomy and sentinel lymph node sampling 05/15/2019 for a pT1b pN0 stage IB invasive ductal carcinoma, grade 3, with negative margins.  (a) a total of 2 sentinel lymph nodes were removed  (3)  adjuvant chemotherapy to consist of cyclophosphamide and docetaxel every 21 days x 4, first dose May 29, 2019  (4) adjuvant radiation to follow  (5) consider prophylactic antiestrogens   PLAN: Ivett is doing well today.  She continues adjuvant chemotherapy with Docetaxel and cyclophosphamide every three weeks and will proceed with her treatment today.  I reviewed her CBC with her which is stable, her CMET is pending.  She has gotten dehydrated with treatment previously, therefore, she will continue to receive one liter of IV fluids the Friday and Saturday after treatment.  She is tolerating that well and I placed orders for this today.    I refilled her Lorazepam.  She is taking this at night as needed.  I recommended that in addition to taking the 1/2 of hydrocodone that she try yoga for her hip pain and the stretching may help with this.    She was recommended to elevate her legs after walking to help with the very mild edema in her left foot.  She will let me know if this worsens.   Her scalp is unremarkable today.  She will continue with the dove soap, and knows if she develops bumps she will apply warm compresses.    She will return in 3 weeks for labs, f/u and her next treatment.  She was recommended to continue with the appropriate pandemic precautions. She  knows to call for any questions that may arise between now and her next appointment.  We are happy to see her sooner if needed.  A total of (30) minutes of face-to-face time was spent with this patient with greater than 50% of that time in counseling and care-coordination.   Wilber Bihari, NP  07/10/19 8:31 AM Medical Oncology and Hematology [facility] [place], [place] [address] Tel. [phone]    Fax. [phone]

## 2019-07-13 ENCOUNTER — Inpatient Hospital Stay: Payer: Medicare Other

## 2019-07-13 ENCOUNTER — Other Ambulatory Visit: Payer: Self-pay

## 2019-07-13 VITALS — BP 100/60 | HR 78 | Temp 98.3°F | Resp 16

## 2019-07-13 DIAGNOSIS — Z5111 Encounter for antineoplastic chemotherapy: Secondary | ICD-10-CM | POA: Diagnosis not present

## 2019-07-13 DIAGNOSIS — C50211 Malignant neoplasm of upper-inner quadrant of right female breast: Secondary | ICD-10-CM

## 2019-07-13 MED ORDER — SODIUM CHLORIDE 0.9 % IV SOLN: 600.0000 mg/m2 | Freq: Once | INTRAVENOUS | Status: DC

## 2019-07-13 MED ORDER — PEGFILGRASTIM 6 MG/0.6ML ~~LOC~~ PSKT: 6.0000 mg | PREFILLED_SYRINGE | Freq: Once | SUBCUTANEOUS | Status: DC

## 2019-07-13 MED ORDER — PALONOSETRON HCL INJECTION 0.25 MG/5ML: 0.2500 mg | Freq: Once | INTRAVENOUS | Status: DC

## 2019-07-13 MED ORDER — SODIUM CHLORIDE 0.9 % IV SOLN
INTRAVENOUS | Status: DC
  Administered 2019-07-13: 15:00:00 via INTRAVENOUS
  Filled 2019-07-13 (×2): qty 250

## 2019-07-13 MED ORDER — SODIUM CHLORIDE 0.9 % IV SOLN: 75.0000 mg/m2 | Freq: Once | INTRAVENOUS | Status: DC

## 2019-07-13 MED ORDER — SODIUM CHLORIDE 0.9% FLUSH
10.0000 mL | INTRAVENOUS | Status: DC | PRN
  Administered 2019-07-13: 10 mL
  Filled 2019-07-13: qty 10

## 2019-07-13 MED ORDER — HEPARIN SOD (PORK) LOCK FLUSH 100 UNIT/ML IV SOLN
500.0000 [IU] | Freq: Once | INTRAVENOUS | Status: AC | PRN
  Administered 2019-07-13: 500 [IU]
  Filled 2019-07-13: qty 5

## 2019-07-13 MED ORDER — SODIUM CHLORIDE 0.9 % IV SOLN
Freq: Once | INTRAVENOUS | Status: AC
  Administered 2019-07-13: 15:00:00 via INTRAVENOUS
  Filled 2019-07-13: qty 250

## 2019-07-13 MED ORDER — DEXAMETHASONE SODIUM PHOSPHATE 10 MG/ML IJ SOLN: 10.0000 mg | Freq: Once | INTRAMUSCULAR | Status: DC

## 2019-07-13 NOTE — Patient Instructions (Addendum)
Dehydration, Adult  Dehydration is when there is not enough fluid or water in your body. This happens when you lose more fluids than you take in. Dehydration can range from mild to very bad. It should be treated right away to keep it from getting very bad. Symptoms of mild dehydration may include:  Thirst.  Dry lips.  Slightly dry mouth.  Dry, warm skin.  Dizziness. Symptoms of moderate dehydration may include:  Very dry mouth.  Muscle cramps.  Dark pee (urine). Pee may be the color of tea.  Your body making less pee.  Your eyes making fewer tears.  Heartbeat that is uneven or faster than normal (palpitations).  Headache.  Light-headedness, especially when you stand up from sitting.  Fainting (syncope). Symptoms of very bad dehydration may include:  Changes in skin, such as: ? Cold and clammy skin. ? Blotchy (mottled) or pale skin. ? Skin that does not quickly return to normal after being lightly pinched and let go (poor skin turgor).  Changes in body fluids, such as: ? Feeling very thirsty. ? Your eyes making fewer tears. ? Not sweating when body temperature is high, such as in hot weather. ? Your body making very little pee.  Changes in vital signs, such as: ? Weak pulse. ? Pulse that is more than 100 beats a minute when you are sitting still. ? Fast breathing. ? Low blood pressure.  Other changes, such as: ? Sunken eyes. ? Cold hands and feet. ? Confusion. ? Lack of energy (lethargy). ? Trouble waking up from sleep. ? Short-term weight loss. ? Unconsciousness. Follow these instructions at home:   If told by your doctor, drink an ORS: ? Make an ORS by using instructions on the package. ? Start by drinking small amounts, about  cup (120 mL) every 5-10 minutes. ? Slowly drink more until you have had the amount that your doctor said to have.  Drink enough clear fluid to keep your pee clear or pale yellow. If you were told to drink an ORS, finish the  ORS first, then start slowly drinking clear fluids. Drink fluids such as: ? Water. Do not drink only water by itself. Doing that can make the salt (sodium) level in your body get too low (hyponatremia). ? Ice chips. ? Fruit juice that you have added water to (diluted). ? Low-calorie sports drinks.  Avoid: ? Alcohol. ? Drinks that have a lot of sugar. These include high-calorie sports drinks, fruit juice that does not have water added, and soda. ? Caffeine. ? Foods that are greasy or have a lot of fat or sugar.  Take over-the-counter and prescription medicines only as told by your doctor.  Do not take salt tablets. Doing that can make the salt level in your body get too high (hypernatremia).  Eat foods that have minerals (electrolytes). Examples include bananas, oranges, potatoes, tomatoes, and spinach.  Keep all follow-up visits as told by your doctor. This is important. Contact a doctor if:  You have belly (abdominal) pain that: ? Gets worse. ? Stays in one area (localizes).  You have a rash.  You have a stiff neck.  You get angry or annoyed more easily than normal (irritability).  You are more sleepy than normal.  You have a harder time waking up than normal.  You feel: ? Weak. ? Dizzy. ? Very thirsty.  You have peed (urinated) only a small amount of very dark pee during 6-8 hours. Get help right away if:  You have   symptoms of very bad dehydration.  You cannot drink fluids without throwing up (vomiting).  Your symptoms get worse with treatment.  You have a fever.  You have a very bad headache.  You are throwing up or having watery poop (diarrhea) and it: ? Gets worse. ? Does not go away.  You have blood or something green (bile) in your throw-up.  You have blood in your poop (stool). This may cause poop to look black and tarry.  You have not peed in 6-8 hours.  You pass out (faint).  Your heart rate when you are sitting still is more than 100 beats a  minute.  You have trouble breathing. This information is not intended to replace advice given to you by your health care provider. Make sure you discuss any questions you have with your health care provider. Document Released: 06/05/2009 Document Revised: 07/22/2017 Document Reviewed: 10/03/2015 Elsevier Patient Education  2020 Elsevier Inc.  

## 2019-07-14 ENCOUNTER — Other Ambulatory Visit: Payer: Self-pay

## 2019-07-14 ENCOUNTER — Ambulatory Visit: Payer: Medicare Other

## 2019-07-14 ENCOUNTER — Inpatient Hospital Stay: Payer: Medicare Other

## 2019-07-14 DIAGNOSIS — Z5111 Encounter for antineoplastic chemotherapy: Secondary | ICD-10-CM | POA: Diagnosis not present

## 2019-07-14 DIAGNOSIS — C50211 Malignant neoplasm of upper-inner quadrant of right female breast: Secondary | ICD-10-CM

## 2019-07-14 MED ORDER — SODIUM CHLORIDE 0.9% FLUSH
3.0000 mL | Freq: Two times a day (BID) | INTRAVENOUS | Status: DC
  Filled 2019-07-14: qty 10

## 2019-07-14 MED ORDER — SODIUM CHLORIDE 0.9 % IV SOLN
INTRAVENOUS | Status: AC
  Administered 2019-07-14: 09:00:00 via INTRAVENOUS
  Filled 2019-07-14 (×2): qty 250

## 2019-07-14 MED ORDER — HEPARIN SOD (PORK) LOCK FLUSH 100 UNIT/ML IV SOLN
500.0000 [IU] | Freq: Once | INTRAVENOUS | Status: AC
  Administered 2019-07-14: 500 [IU]
  Filled 2019-07-14: qty 5

## 2019-07-14 MED ORDER — SODIUM CHLORIDE 0.9% FLUSH
10.0000 mL | Freq: Once | INTRAVENOUS | Status: AC
  Administered 2019-07-14: 10 mL
  Filled 2019-07-14: qty 10

## 2019-07-14 NOTE — Patient Instructions (Signed)

## 2019-07-20 ENCOUNTER — Other Ambulatory Visit: Payer: Self-pay | Admitting: Family Medicine

## 2019-07-25 ENCOUNTER — Telehealth: Payer: Self-pay | Admitting: Family Medicine

## 2019-07-25 NOTE — Telephone Encounter (Signed)
Last OV 07/02/19 Last refill 01/17/19 #90/1 Next OV not scheduled

## 2019-07-25 NOTE — Telephone Encounter (Signed)
Rx sent 

## 2019-07-25 NOTE — Telephone Encounter (Signed)
Copied from [place] [phone]. Topic: Quick Communication - Rx Refill/Question >> Jul 25, 2019 10:10 AM Carolyn Stare wrote: Medication simvastatin (ZOCOR) 40 MG tablet   Preferred Pharmacy  [facility]: Please be advised that RX refills may take up to 3 business days. We ask that you follow-up with your pharmacy.

## 2019-07-31 ENCOUNTER — Encounter: Payer: Self-pay | Admitting: Oncology

## 2019-07-31 ENCOUNTER — Inpatient Hospital Stay: Payer: Medicare Other

## 2019-07-31 ENCOUNTER — Other Ambulatory Visit: Payer: Self-pay

## 2019-07-31 ENCOUNTER — Encounter: Payer: Self-pay | Admitting: *Deleted

## 2019-07-31 ENCOUNTER — Inpatient Hospital Stay: Payer: Medicare Other | Attending: Oncology | Admitting: Adult Health

## 2019-07-31 ENCOUNTER — Encounter: Payer: Self-pay | Admitting: Adult Health

## 2019-07-31 VITALS — BP 118/65 | HR 88 | Temp 98.0°F | Resp 18 | Ht 62.25 in | Wt 133.6 lb

## 2019-07-31 DIAGNOSIS — Z95828 Presence of other vascular implants and grafts: Secondary | ICD-10-CM

## 2019-07-31 DIAGNOSIS — Z171 Estrogen receptor negative status [ER-]: Secondary | ICD-10-CM

## 2019-07-31 DIAGNOSIS — Z5111 Encounter for antineoplastic chemotherapy: Secondary | ICD-10-CM | POA: Diagnosis present

## 2019-07-31 DIAGNOSIS — Z5189 Encounter for other specified aftercare: Secondary | ICD-10-CM | POA: Insufficient documentation

## 2019-07-31 DIAGNOSIS — C50211 Malignant neoplasm of upper-inner quadrant of right female breast: Secondary | ICD-10-CM

## 2019-07-31 LAB — CBC WITH DIFFERENTIAL/PLATELET
Abs Immature Granulocytes: 0.03 10*3/uL (ref 0.00–0.07)
Basophils Absolute: 0 10*3/uL (ref 0.0–0.1)
Basophils Relative: 0 %
Eosinophils Absolute: 0 10*3/uL (ref 0.0–0.5)
Eosinophils Relative: 0 %
HCT: 29.9 % — ABNORMAL LOW (ref 36.0–46.0)
Hemoglobin: 9.8 g/dL — ABNORMAL LOW (ref 12.0–15.0)
Immature Granulocytes: 0 %
Lymphocytes Relative: 5 %
Lymphs Abs: 0.4 10*3/uL — ABNORMAL LOW (ref 0.7–4.0)
MCH: 33.8 pg (ref 26.0–34.0)
MCHC: 32.8 g/dL (ref 30.0–36.0)
MCV: 103.1 fL — ABNORMAL HIGH (ref 80.0–100.0)
Monocytes Absolute: 0.9 10*3/uL (ref 0.1–1.0)
Monocytes Relative: 11 %
Neutro Abs: 6.6 10*3/uL (ref 1.7–7.7)
Neutrophils Relative %: 84 %
Platelets: 288 10*3/uL (ref 150–400)
RBC: 2.9 MIL/uL — ABNORMAL LOW (ref 3.87–5.11)
RDW: 14.9 % (ref 11.5–15.5)
WBC: 7.9 10*3/uL (ref 4.0–10.5)
nRBC: 0 % (ref 0.0–0.2)

## 2019-07-31 LAB — COMPREHENSIVE METABOLIC PANEL
ALT: 10 U/L (ref 0–44)
AST: 11 U/L — ABNORMAL LOW (ref 15–41)
Albumin: 3.4 g/dL — ABNORMAL LOW (ref 3.5–5.0)
Alkaline Phosphatase: 83 U/L (ref 38–126)
Anion gap: 10 (ref 5–15)
BUN: 18 mg/dL (ref 8–23)
CO2: 23 mmol/L (ref 22–32)
Calcium: 9.1 mg/dL (ref 8.9–10.3)
Chloride: 109 mmol/L (ref 98–111)
Creatinine, Ser: 0.62 mg/dL (ref 0.44–1.00)
GFR calc Af Amer: >60 mL/min (ref >60)
GFR calc non Af Amer: >60 mL/min (ref >60)
Glucose, Bld: 113 mg/dL — ABNORMAL HIGH (ref 70–99)
Potassium: 4.1 mmol/L (ref 3.5–5.1)
Sodium: 142 mmol/L (ref 135–145)
Total Bilirubin: 0.4 mg/dL (ref 0.3–1.2)
Total Protein: 6 g/dL — ABNORMAL LOW (ref 6.5–8.1)

## 2019-07-31 MED ORDER — SODIUM CHLORIDE 0.9 % IV SOLN
Freq: Once | INTRAVENOUS | Status: AC
  Administered 2019-07-31: 11:00:00 via INTRAVENOUS
  Filled 2019-07-31: qty 250

## 2019-07-31 MED ORDER — SODIUM CHLORIDE 0.9 % IV SOLN
600.0000 mg/m2 | Freq: Once | INTRAVENOUS | Status: AC
  Administered 2019-07-31: 960 mg via INTRAVENOUS
  Filled 2019-07-31: qty 48

## 2019-07-31 MED ORDER — PEGFILGRASTIM 6 MG/0.6ML ~~LOC~~ PSKT
6.0000 mg | PREFILLED_SYRINGE | Freq: Once | SUBCUTANEOUS | Status: AC
  Administered 2019-07-31: 6 mg via SUBCUTANEOUS

## 2019-07-31 MED ORDER — HEPARIN SOD (PORK) LOCK FLUSH 100 UNIT/ML IV SOLN
500.0000 [IU] | Freq: Once | INTRAVENOUS | Status: AC | PRN
  Administered 2019-07-31: 500 [IU]
  Filled 2019-07-31: qty 5

## 2019-07-31 MED ORDER — SODIUM CHLORIDE 0.9% FLUSH
10.0000 mL | INTRAVENOUS | Status: DC | PRN
  Administered 2019-07-31: 10 mL
  Filled 2019-07-31: qty 10

## 2019-07-31 MED ORDER — SODIUM CHLORIDE 0.9% FLUSH
10.0000 mL | Freq: Once | INTRAVENOUS | Status: AC
  Administered 2019-07-31: 10:00:00 10 mL via INTRAVENOUS
  Filled 2019-07-31: qty 10

## 2019-07-31 MED ORDER — DEXAMETHASONE SODIUM PHOSPHATE 10 MG/ML IJ SOLN
10.0000 mg | Freq: Once | INTRAMUSCULAR | Status: AC
  Administered 2019-07-31: 10 mg via INTRAVENOUS

## 2019-07-31 MED ORDER — PEGFILGRASTIM 6 MG/0.6ML ~~LOC~~ PSKT
PREFILLED_SYRINGE | SUBCUTANEOUS | Status: AC
  Filled 2019-07-31: qty 0.6

## 2019-07-31 MED ORDER — DEXAMETHASONE SODIUM PHOSPHATE 10 MG/ML IJ SOLN
INTRAMUSCULAR | Status: AC
  Filled 2019-07-31: qty 1

## 2019-07-31 MED ORDER — PALONOSETRON HCL INJECTION 0.25 MG/5ML
0.2500 mg | Freq: Once | INTRAVENOUS | Status: AC
  Administered 2019-07-31: 0.25 mg via INTRAVENOUS

## 2019-07-31 MED ORDER — SODIUM CHLORIDE 0.9 % IV SOLN
75.0000 mg/m2 | Freq: Once | INTRAVENOUS | Status: AC
  Administered 2019-07-31: 120 mg via INTRAVENOUS
  Filled 2019-07-31: qty 12

## 2019-07-31 MED ORDER — PALONOSETRON HCL INJECTION 0.25 MG/5ML
INTRAVENOUS | Status: AC
  Filled 2019-07-31: qty 5

## 2019-07-31 NOTE — Progress Notes (Signed)
[facility]  [phone] [phone]    ID: Joanne Hansen DOB: 06/21/1946  MR#: [id number]  TGG#:[id number]  Patient Care Team: Caren Macadam, MD as PCP - General (Family Medicine) Magrinat, Virgie Dad, MD as Consulting Physician (Oncology) Erroll Luna, MD as Consulting Physician (General Surgery) Kyung Rudd, MD as Consulting Physician (Radiation Oncology) Hayden Pedro, PA-C as Physician Assistant (Radiation Oncology) Leighton Ruff, MD as Consulting Physician (General Surgery) Bjorn Loser, MD as Consulting Physician (Urology) OTHER MD:  CHIEF COMPLAINT: triple negative invasive ductal carcinoma  CURRENT TREATMENT: Adjuvant chemotherapy   INTERVAL HISTORY: Joanne Hansen returns today for follow-up and treatment of her triple negative invasive ductal carcinoma.   She was started on adjuvant chemotherapy with Docetaxel and Cyclophosphamide given on day 1 of a 21 day cycle x 4.  She is currently cycle 4 day 1 of treatment.  She receives Neulasta in the form of Onpro.    She notes that she receives IV fluids on the Friday and Saturday following chemotherapy and this helps to prevent the dehydration that she suffered from after her initial treatment.     REVIEW OF SYSTEMS: Joanne Hansen is doing moderately well today. Her hip is causing more pain for her.  She wants to know if she can receive fluids on Friday or Saturday of this week.  She also wants to know when she can have her port removed.   Joanne Hansen is otherwise feeling very well.  She is without fever, chills, chest pain, palpitations, cough, shortness of breath, bowel/bladder changes, headaches, nausea, vomiting, peripheral neuropathy, or any other concerns.  A detailed ROS was otherwise non contributory.     HISTORY OF CURRENT ILLNESS: From the original intake note:  Joanne Hansen had routine screening mammography on 04/10/2019 showing a possible abnormality in the right  breast. She underwent unilateral right diagnostic mammography with tomography and right breast ultrasonography at [facility] on 04/12/2019 showing: Breast Density Category B. Spot compression views of the medial right breast, including tomography confirm an irregular mass spanning approximately 0.9 cm. On physical exam, no mass is palpated in the upper inner quadrant of the right breast. Targeted ultrasound is performed, showing a single irregular mass versus two immediately adjacent small irregular masses, that in total measure 1.1 cm. Individually, the two irregular masses measure 0.8 x 0.4 x 0.5 cm and 0.4 x 0.4 x 0.4 cm, respectively. Mass(es) are at 1 o'clock position 3 cm from the nipple. Ultrasound of the right axilla is negative for lymphadenopathy.  Accordingly on 04/16/2019 she proceeded to biopsy of the right breast area in question. The pathology from this procedure showed ([id number]): invasive ductal carcinoma, grade II. Prognostic indicators significant for: estrogen receptor, 0% negative and progesterone receptor, 0% negative,. Proliferation marker Ki67 at 20%. HER2 negative (1+) by immunohistochemistry.  The patient's subsequent history is as detailed below.   PAST MEDICAL HISTORY: Past Medical History:  Diagnosis Date  . Arthritis   . Family history of brain cancer   . Family history of breast cancer   . Family history of colon cancer   . Fecal incontinence    05-16-2019 per pt only a little leakage but controllable (was in clinical trial for [facility] without benefit.)  . Frequent headaches   . History of syncope    pre-syncope  08/ 2015  per pt no issue with this since   . Hyperlipidemia   . Malignant neoplasm of upper-inner quadrant of right breast in female, estrogen receptor  negative [facility]) oncologist-- dr Jana Hakim  dr moody   dx 08/ 2020--  invasive ductal carcinoma,  05-13-2019  s/p right breast lumpectomy   . PAF (paroxysmal atrial fibrillation) ([place]) (05-16-2019    pt from [place], recently moved to [place] to be near daughter, has not established a cardiology yet--  previously seen by dr Lowella Dandy cossu (last office note dated 02-28-2018 scanned in epic)---  echo 04-15-2014  ef 55-60%, G1DD mild AR without stenosis, RVSp 34mHg  . Rectal cyst   . Thyroid goiter    pt ultrasound in epic 06-04-2014 , nodules, no bx done  . Urinary incontinence, mixed      PAST SURGICAL HISTORY: Past Surgical History:  Procedure Laterality Date  . ABDOMINAL HYSTERECTOMY  1978   w/  RSO  and APPENDECTOMY  . BREAST LUMPECTOMY WITH RADIOACTIVE SEED AND SENTINEL LYMPH NODE BIOPSY Right 05/15/2019   Procedure: RIGHT BREAST LUMPECTOMY WITH RADIOACTIVE SEED AND SENTINEL LYMPH NODE MAPPING;  Surgeon: CErroll Luna MD;  [place] [facility]  Service: General;  Laterality: Right;  . BREAST SURGERY  2011   biospy of right breast  . CATARACT EXTRACTION W/ INTRAOCULAR LENS  IMPLANT, BILATERAL  2004  . FLEXIBLE SIGMOIDOSCOPY N/A 05/18/2019   Procedure: FLEXIBLE SIGMOIDOSCOPY,  TRANSANAL EXCISION inclusion cyst;  Surgeon: TLeighton Ruff MD;  [facility]  Service: General;  Laterality: N/A;  . gluteus medius repair  2017  . LUMBAR LAMINECTOMY  2013   L5 -- S1  . PORTACATH PLACEMENT Right 05/15/2019   Procedure: INSERTION PORT-A-CATH WITH ULTRASOUND;  Surgeon: CErroll Luna MD;  [place] [facility]  Service: General;  Laterality: Right;  . ROTATOR CUFF REPAIR Left 2001     FAMILY HISTORY: Family History  Problem Relation Age of Onset  . COPD Mother   . Cancer Mother 680      colon  . Arthritis Mother   . Hypertension Mother   . Miscarriages / SKoreaMother   . Stroke Mother 774 . Alzheimer's disease Mother 843 . Heart disease Father   . Heart attack Father 249 . Lung disease Father   . Arthritis Sister   . Breast cancer Sister 717      Lumpectomy  . Diabetes Maternal Grandmother   . Breast cancer Niece 353       Louise's Daughter  . Cervical cancer Niece 411  The patient's father died at age 2726from a heart attack.  The patient's mother died at age 71 she had a gastrointestinal cancer diagnosed age 71  The patient has 1 sister with breast cancer and one niece with breast cancer diagnosed at age 71   GYNECOLOGIC HISTORY:  No LMP recorded. Patient has had a hysterectomy. 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  Her husband GGermain Hansen(goes by "GRonald Pippins) is a retired eChief Financial Officer  Their daughter KJohnanna Schneiders(currently divorced) [place]  The patient has no grandchildren.  She is not a church attender   ADVANCED DIRECTIVES: The patient's husband is her healthcare power of attorney   HEALTH MAINTENANCE: Social History   Tobacco Use  . Smoking status: Never Smoker  . Smokeless tobacco: Never Used  Substance Use Topics  . Alcohol  use: Yes  . Drug use: Yes    Types: Amphetamines    Colonoscopy: Due 2022  PAP: Status post hysterectomy  Bone density: Pending   Allergies  Allergen Reactions  . Penicillins Swelling  . Sulfa Antibiotics Nausea And Vomiting    Current Outpatient Medications  Medication Sig Dispense Refill  . Calcium-Phosphorus-Vitamin D (CITRACAL CALCIUM GUMMIES PO) Take 2 each by mouth daily.    . Cyanocobalamin (VITAMIN B 12 PO) Take 1,000 mg by mouth daily. sublingal    . dexamethasone (DECADRON) 4 MG tablet Take 2 tablets (8 mg total) by mouth 2 (two) times daily. Start the day before Taxotere. Then again the day after chemo for 3 days. 30 tablet 1  . HYDROcodone-acetaminophen (NORCO) 5-325 MG tablet Take 1 tablet by mouth every 6 (six) hours as needed for moderate pain. 30 tablet 0  . lidocaine-prilocaine (EMLA)  cream Apply to affected area once 30 g 3  . loratadine (CLARITIN) 10 MG tablet Take 1 tablet (10 mg total) by mouth daily. 90 tablet 0  . LORazepam (ATIVAN) 0.5 MG tablet Take 1 tablet (0.5 mg total) by mouth at bedtime as needed (Nausea or vomiting). 30 tablet 0  . methylcellulose oral powder Take by mouth every evening.     . metoprolol tartrate (LOPRESSOR) 25 MG tablet Take 1/2 (one-half) tablet by mouth twice daily 90 tablet 0  . metroNIDAZOLE (METROCREAM) 0.75 % cream Apply topically 2 (two) times daily. 45 g 0  . ondansetron (ZOFRAN) 8 MG tablet Take 1 tablet (8 mg total) by mouth 2 (two) times daily as needed for refractory nausea / vomiting. Start on day 3 after chemo. 30 tablet 1  . prochlorperazine (COMPAZINE) 10 MG tablet Take 1 tablet (10 mg total) by mouth every 6 (six) hours as needed (Nausea or vomiting). 30 tablet 1  . simvastatin (ZOCOR) 40 MG tablet Take 1 tablet by mouth once daily 90 tablet 1   Current Facility-Administered Medications  Medication Dose Route Frequency Provider Last Rate Last Dose  . ondansetron (ZOFRAN) injection 4 mg  4 mg Intravenous Once PRN Magrinat, Virgie Dad, MD       Facility-Administered Medications Ordered in Other Visits  Medication Dose Route Frequency Provider Last Rate Last Dose  . sodium chloride flush (NS) 0.9 % injection 10 mL  10 mL Intracatheter PRN Gardenia Phlegm, NP   10 mL at 07/31/19 1336     OBJECTIVE:  Vitals:   07/31/19 0957  BP: 118/65  Pulse: 88  Resp: 18  Temp: 98 F (36.7 C)  SpO2: 100%   Wt Readings from Last 3 Encounters:  07/31/19 133 lb 9.6 oz (60.6 kg)  07/10/19 130 lb 4.8 oz (59.1 kg)  07/02/19 127 lb 11.2 oz (57.9 kg)   Body mass index is 24.24 kg/m.    ECOG FS:1 - Symptomatic but completely ambulatory  GENERAL: Patient is a well appearing female in no acute distress HEENT:  Sclerae anicteric.  Oropharynx clear and moist. No ulcerations or evidence of oropharyngeal candidiasis. Neck is supple.   NODES:  No cervical, supraclavicular, or axillary lymphadenopathy palpated.  BREAST EXAM:  Right breast s/p lumpectomy, no sign of local recurrence, though some dry skin noted around the lumpectomy site, left breast benign.   LUNGS:  Clear to auscultation bilaterally.  No wheezes or rhonchi. HEART:  Regular rate and rhythm. No murmur appreciated. ABDOMEN:  Soft, nontender.  Positive, normoactive bowel sounds. No organomegaly palpated. MSK:  No focal spinal tenderness to  palpation. Full range of motion bilaterally in the upper extremities. EXTREMITIES: scant left pedal edema SKIN:  Clear with no obvious rashes or skin changes. No nail dyscrasia. Scalp unremarkable NEURO:  Nonfocal. Well oriented.  Appropriate affect.     LAB RESULTS:  CMP     Component Value Date/Time   NA 142 07/31/2019 0940   K 4.1 07/31/2019 0940   CL 109 07/31/2019 0940   CO2 23 07/31/2019 0940   GLUCOSE 113 (H) 07/31/2019 0940   BUN 18 07/31/2019 0940   CREATININE 0.62 07/31/2019 0940   CREATININE 0.77 05/07/2019 1547   CALCIUM 9.1 07/31/2019 0940   PROT 6.0 (L) 07/31/2019 0940   ALBUMIN 3.4 (L) 07/31/2019 0940   AST 11 (L) 07/31/2019 0940   AST 19 05/07/2019 1547   ALT 10 07/31/2019 0940   ALT 13 05/07/2019 1547   ALKPHOS 83 07/31/2019 0940   BILITOT 0.4 07/31/2019 0940   BILITOT 0.5 05/07/2019 1547   GFRNONAA >60 07/31/2019 0940   GFRNONAA >60 05/07/2019 1547   GFRAA >60 07/31/2019 0940   GFRAA >60 05/07/2019 1547    No results found for: TOTALPROTELP, ALBUMINELP, A1GS, A2GS, BETS, BETA2SER, GAMS, MSPIKE, SPEI  No results found for: KPAFRELGTCHN, LAMBDASER, KAPLAMBRATIO  Lab Results  Component Value Date   WBC 7.9 07/31/2019   NEUTROABS 6.6 07/31/2019   HGB 9.8 (L) 07/31/2019   HCT 29.9 (L) 07/31/2019   MCV 103.1 (H) 07/31/2019   PLT 288 07/31/2019    No results found for: LABCA2  No components found for: TGYBWL893  No results for input(s): INR in the last 168 hours.  No results  found for: LABCA2  No results found for: TDS287  No results found for: GOT157  No results found for: WIO035  No results found for: CA2729  No components found for: HGQUANT  No results found for: CEA1 / No results found for: CEA1   No results found for: AFPTUMOR  No results found for: CHROMOGRNA  No results found for: PSA1  Appointment on 07/31/2019  Component Date Value Ref Range Status  . Sodium 07/31/2019 142  135 - 145 mmol/L Final  . Potassium 07/31/2019 4.1  3.5 - 5.1 mmol/L Final  . Chloride 07/31/2019 109  98 - 111 mmol/L Final  . CO2 07/31/2019 23  22 - 32 mmol/L Final  . Glucose, Bld 07/31/2019 113* 70 - 99 mg/dL Final  . BUN 07/31/2019 18  8 - 23 mg/dL Final  . Creatinine, Ser 07/31/2019 0.62  0.44 - 1.00 mg/dL Final  . Calcium 07/31/2019 9.1  8.9 - 10.3 mg/dL Final  . Total Protein 07/31/2019 6.0* 6.5 - 8.1 g/dL Final  . Albumin 07/31/2019 3.4* 3.5 - 5.0 g/dL Final  . AST 07/31/2019 11* 15 - 41 U/L Final  . ALT 07/31/2019 10  0 - 44 U/L Final  . Alkaline Phosphatase 07/31/2019 83  38 - 126 U/L Final  . Total Bilirubin 07/31/2019 0.4  0.3 - 1.2 mg/dL Final  . GFR calc non Af Amer 07/31/2019 >60  >60 mL/min Final  . GFR calc Af Amer 07/31/2019 >60  >60 mL/min Final  . Anion gap 07/31/2019 10  5 - 15 Final   Performed at [facility], [place] [address] [place], [place] [address]  . WBC 07/31/2019 7.9  4.0 - 10.5 K/uL Final  . RBC 07/31/2019 2.90* 3.87 - 5.11 MIL/uL Final  . Hemoglobin 07/31/2019 9.8* 12.0 - 15.0 g/dL Final  . HCT 07/31/2019 29.9*  36.0 - 46.0 % Final  . MCV 07/31/2019 103.1* 80.0 - 100.0 fL Final  . MCH 07/31/2019 33.8  26.0 - 34.0 pg Final  . MCHC 07/31/2019 32.8  30.0 - 36.0 g/dL Final  . RDW 07/31/2019 14.9  11.5 - 15.5 % Final  . Platelets 07/31/2019 288  150 - 400 K/uL Final  . nRBC 07/31/2019 0.0  0.0 - 0.2 % Final  . Neutrophils Relative % 07/31/2019 84  % Final  . Neutro Abs 07/31/2019 6.6  1.7 - 7.7 K/uL Final   . Lymphocytes Relative 07/31/2019 5  % Final  . Lymphs Abs 07/31/2019 0.4* 0.7 - 4.0 K/uL Final  . Monocytes Relative 07/31/2019 11  % Final  . Monocytes Absolute 07/31/2019 0.9  0.1 - 1.0 K/uL Final  . Eosinophils Relative 07/31/2019 0  % Final  . Eosinophils Absolute 07/31/2019 0.0  0.0 - 0.5 K/uL Final  . Basophils Relative 07/31/2019 0  % Final  . Basophils Absolute 07/31/2019 0.0  0.0 - 0.1 K/uL Final  . Immature Granulocytes 07/31/2019 0  % Final  . Abs Immature Granulocytes 07/31/2019 0.03  0.00 - 0.07 K/uL Final   Performed at [facility], [place] [address], [place], [place] [address]    (this displays the last labs from the last 3 days)  No results found for: TOTALPROTELP, ALBUMINELP, A1GS, A2GS, BETS, BETA2SER, GAMS, MSPIKE, SPEI (this displays SPEP labs)  No results found for: KPAFRELGTCHN, LAMBDASER, KAPLAMBRATIO (kappa/lambda light chains)  No results found for: HGBA, HGBA2QUANT, HGBFQUANT, HGBSQUAN (Hemoglobinopathy evaluation)   No results found for: LDH  No results found for: IRON, TIBC, IRONPCTSAT (Iron and TIBC)  No results found for: FERRITIN  Urinalysis No results found for: COLORURINE, APPEARANCEUR, LABSPEC, PHURINE, GLUCOSEU, HGBUR, BILIRUBINUR, KETONESUR, PROTEINUR, UROBILINOGEN, NITRITE, LEUKOCYTESUR   STUDIES:  No results found.   ELIGIBLE FOR AVAILABLE RESEARCH PROTOCOL: NO   ASSESSMENT: 73 y.o. [place], [place] woman status post right breast upper quadrant biopsy for an mT1b (or single t1c0 invasive ductal carcinoma, grade 2, triple negative, with an MIB-1-1 of 20%.  (1) Genetic testing on 05/07/2019 through the [facility] Breast Cancer STAT Panel + Common Hereditary cancer panel found no pathogenic mutations. The STAT Breast cancer panel offered by [facility] includes sequencing and rearrangement analysis for the following 9 genes:  ATM, BRCA1, BRCA2, CDH1, CHEK2, PALB2, PTEN, STK11 and TP53.  The Common Hereditary Cancers Panel  offered by [facility] includes sequencing and/or deletion duplication testing of the following 47 genes: APC, ATM, AXIN2, BARD1, BMPR1A, BRCA1, BRCA2, BRIP1, CDH1, CDKN2A (p14ARF), CDKN2A (p16INK4a), CKD4, CHEK2, CTNNA1, DICER1, EPCAM (Deletion/duplication testing only), GREM1 (promoter region deletion/duplication testing only), KIT, MEN1, MLH1, MSH2, MSH3, MSH6, MUTYH, NBN, NF1, NHTL1, PALB2, PDGFRA, PMS2, POLD1, POLE, PTEN, RAD50, RAD51C, RAD51D, SDHB, SDHC, SDHD, SMAD4, SMARCA4. STK11, TP53, TSC1, TSC2, and VHL.  The following genes were evaluated for sequence changes only: SDHA and HOXB13 c.251G>A variant only.  (2) s/p right lumpectomy and sentinel lymph node sampling 05/15/2019 for a pT1b pN0 stage IB invasive ductal carcinoma, grade 3, with negative margins.  (a) a total of 2 sentinel lymph nodes were removed  (3) adjuvant chemotherapy to consist of cyclophosphamide and docetaxel every 21 days x 4, first dose May 29, 2019  (4) adjuvant radiation to follow  (5) consider prophylactic antiestrogens   PLAN: Jennings continues on adjuvant chemotherapy with cyclophosphamide and docetaxel given every three weeks.  She is tolerating this well, particularly with the addition of IV fluids to the  Friday and Saturday following her treatments.  She will do this later this week, and I placed orders for this along with a  Scheduling request for this today.  Dajon and I talked about how to take her miralax to prevent constipation, we also talked about her hip pain which has continued.  She tells me that it is a chronic issue, that started many years ago, and when she went vegan it resolved, and then restarted when she started chemotherapy.  Since she is completing chemotherapy, I am hopeful that the pain will resolve soon.  She remains active which is good.  If the pain is persistent in the next few weeks, she will call us so that I can order plain films of the hip.    I placed a radiation oncology referral  for [facility] today.  Takayla will return later this week for IV fluids.  She will see Dr. Jana Hakim in about 8-10 weeks.     She was recommended to continue with the appropriate pandemic precautions. She knows to call for any questions that may arise between now and her next appointment.  We are happy to see her sooner if needed. I congratulated her on her chemotherapy completion.  A total of (30) minutes of face-to-face time was spent with this patient with greater than 50% of that time in counseling and care-coordination.   Wilber Bihari, NP  07/31/19 3:10 PM Medical Oncology and Hematology [facility] [place], [place] [address] Tel. [phone]    Fax. [phone]

## 2019-07-31 NOTE — Patient Instructions (Signed)
[facility] Discharge Instructions for Patients Receiving Chemotherapy  Today you received the following chemotherapy agents: Taxotere and Cytoxan.  To help prevent nausea and vomiting after your treatment, we encourage you to take your nausea medication as prescribed.  If you develop nausea and vomiting that is not controlled by your nausea medication, call the clinic.   BELOW ARE SYMPTOMS THAT SHOULD BE REPORTED IMMEDIATELY:  *FEVER GREATER THAN 100.5 F  *CHILLS WITH OR WITHOUT FEVER  NAUSEA AND VOMITING THAT IS NOT CONTROLLED WITH YOUR NAUSEA MEDICATION  *UNUSUAL SHORTNESS OF BREATH  *UNUSUAL BRUISING OR BLEEDING  TENDERNESS IN MOUTH AND THROAT WITH OR WITHOUT PRESENCE OF ULCERS  *URINARY PROBLEMS  *BOWEL PROBLEMS  UNUSUAL RASH Items with * indicate a potential emergency and should be followed up as soon as possible.  Feel free to call the clinic should you have any questions or concerns. The clinic phone number is [phone].  Please show the [facility] at check-in to the Emergency Department and triage nurse.

## 2019-08-02 ENCOUNTER — Other Ambulatory Visit: Payer: Self-pay

## 2019-08-02 ENCOUNTER — Encounter: Payer: Self-pay | Admitting: Cardiovascular Disease

## 2019-08-02 ENCOUNTER — Telehealth: Payer: Self-pay | Admitting: Radiology

## 2019-08-02 ENCOUNTER — Ambulatory Visit (INDEPENDENT_AMBULATORY_CARE_PROVIDER_SITE_OTHER): Payer: Medicare Other | Admitting: Cardiovascular Disease

## 2019-08-02 VITALS — BP 114/69 | HR 84 | Ht 62.25 in | Wt 132.6 lb

## 2019-08-02 DIAGNOSIS — I480 Paroxysmal atrial fibrillation: Secondary | ICD-10-CM | POA: Diagnosis not present

## 2019-08-02 DIAGNOSIS — R609 Edema, unspecified: Secondary | ICD-10-CM | POA: Diagnosis not present

## 2019-08-02 DIAGNOSIS — R600 Localized edema: Secondary | ICD-10-CM

## 2019-08-02 DIAGNOSIS — E782 Mixed hyperlipidemia: Secondary | ICD-10-CM

## 2019-08-02 DIAGNOSIS — R42 Dizziness and giddiness: Secondary | ICD-10-CM

## 2019-08-02 DIAGNOSIS — R0602 Shortness of breath: Secondary | ICD-10-CM

## 2019-08-02 NOTE — Assessment & Plan Note (Signed)
New onset dependent edema over the last several months and starting chemotherapy.  I am to get a 2D echo to further evaluate LV function.

## 2019-08-02 NOTE — Telephone Encounter (Signed)
Enrolled patient for a 30 day [employer] event monitor to be mailed to patients home  

## 2019-08-02 NOTE — Patient Instructions (Signed)
Medication Instructions:  Your physician recommends that you continue on your current medications as directed. Please refer to the Current Medication list given to you today.  If you need a refill on your cardiac medications before your next appointment, please call your pharmacy.   Lab work: NONE  Testing/Procedures: Your physician has requested that you have an echocardiogram. Echocardiography is a painless test that uses sound waves to create images of your heart. It provides your doctor with information about the size and shape of your heart and how well your heart's chambers and valves are working. This procedure takes approximately one hour. There are no restrictions for this procedure. [place] physician has recommended that you wear an event monitor. Event monitors are medical devices that record the heart's electrical activity. Doctors most often [place] these monitors to diagnose arrhythmias. Arrhythmias are problems with the speed or rhythm of the heartbeat. The monitor is a small, portable device. You can wear one while you do your normal daily activities. This is usually used to diagnose what is causing palpitations/syncope (passing out).  Follow-Up: At [facility], you and your health needs are our priority.  As part of our continuing mission to provide you with exceptional heart care, we have created designated Provider Care Teams.  These Care Teams include your primary Cardiologist (physician) and Advanced Practice Providers (APPs -  Physician Assistants and Nurse Practitioners) who all work together to provide you with the care you need, when you need it. You may see Dr Gwenlyn Found or one of the following Advanced Practice Providers on your designated Care Team:    Kerin Ransom, PA-C  [place], [place]  Coletta Memos, [place]  Your physician wants you to follow-up in: 6 weeks  Any Other Special Instructions Will Be Listed Below (If  Applicable).  [employer] Cardiac Event Monitor Instructions Your physician has requested you wear your cardiac event monitor for 30 days, (1-30). [employer] may call or text to confirm a shipping address. The monitor will be sent to a land address via UPS. [employer] will not ship a monitor to a PO BOX. It typically takes 3-5 days to receive your monitor after it has been enrolled. [employer] will assist with USPS tracking if your package is delayed. The telephone number for [employer] is [phone]. Once you have received your monitor, please review the enclosed instructions. Instruction tutorials can also be viewed under help and settings on the enclosed cell phone. Your monitor has already been registered assigning a specific monitor serial # to you.  Applying the monitor Remove cell phone from case and turn it on. The cell phone works as Dealer and needs to be within [employer] of you at all times. The cell phone will need to be charged on a daily basis. We recommend you plug the cell phone into the enclosed charger at your bedside table every night.  Monitor batteries: You will receive two monitor batteries labelled #1 and #2. These are your recorders. Plug battery #2 onto the second connection on the enclosed charger. Keep one battery on the charger at all times. This will keep the monitor battery deactivated. It will also keep it fully charged for when you need to switch your monitor batteries. A small light will be blinking on the battery emblem when it is charging. The light on the battery emblem will remain on when the battery is fully charged.  Open package of a Monitor strip. Insert battery #1  into black hood on strip and gently squeeze monitor battery onto connection as indicated in instruction booklet. Set aside while preparing skin.  Choose location for your strip, vertical or horizontal, as indicated in the instruction booklet. Shave to remove all hair from  location. There cannot be any lotions, oils, powders, or colognes on skin where monitor is to be applied. Wipe skin clean with enclosed Saline wipe. Dry skin completely.  Peel paper labeled #1 off the back of the Monitor strip exposing the adhesive. Place the monitor on the chest in the vertical or horizontal position shown in the instruction booklet. One arrow on the monitor strip must be pointing upward. Carefully remove paper labeled #2, attaching remainder of strip to your skin. Try not to create any folds or wrinkles in the strip as you apply it.  Firmly press and release the circle in the center of the monitor battery. You will hear a small beep. This is turning the monitor battery on. The heart emblem on the monitor battery will light up every 5 seconds if the monitor battery in turned on and connected to the patient securely. Do not push and hold the circle down as this turns the monitor battery off. The cell phone will locate the monitor battery. A screen will appear on the cell phone checking the connection of your monitor strip. This may read poor connection initially but change to good connection within the next minute. Once your monitor accepts the connection you will hear a series of 3 beeps followed by a climbing crescendo of beeps. A screen will appear on the cell phone showing the two monitor strip placement options. Touch the picture that demonstrates where you applied the monitor strip.  Your monitor strip and battery are waterproof. You are able to shower, bathe, or swim with the monitor on. They just ask you do not submerge deeper than 3 feet underwater. We recommend removing the monitor if you are swimming in a lake, river, or ocean.  Your monitor battery will need to be switched to a fully charged monitor battery approximately once a week. The cell phone will alert you of an action which needs to be made.  On the cell phone, tap for details to reveal connection status,  monitor battery status, and cell phone battery status. The green dots indicates your monitor is in good status. A red dot indicates there is something that needs your attention.  To record a symptom, click the circle on the monitor battery. In 30-60 seconds a list of symptoms will appear on the cell phone. Select your symptom and tap save. Your monitor will record a sustained or significant arrhythmia regardless of you clicking the button. Some patients do not feel the heart rhythm irregularities. [employer] will notify us of any serious or critical events.  Refer to instruction booklet for instructions on switching batteries, changing strips, the Do not disturb or Pause features, or any additional questions.  Call [employer] at [phone], to confirm your monitor is transmitting and record your baseline. They will answer any questions you may have regarding the monitor instructions at that time.  Returning the monitor to [place] all equipment back into blue box. Peel off strip of paper to expose adhesive and close box securely. There is a prepaid UPS shipping label on this box. Drop in a UPS drop box, or at a UPS facility like [employer]. You may also contact [employer] to arrange UPS to pick up monitor package at your home.

## 2019-08-02 NOTE — Assessment & Plan Note (Signed)
History of hyperlipidemia on statin therapy with lipid profile performed 07/02/2019 revealing total cholesterol 159, LDL 79 and HDL 54.

## 2019-08-02 NOTE — Assessment & Plan Note (Signed)
Joanne Hansen has had episodes of dizziness since beginning chemotherapy in September.  She is had approximately a half a dozen.  She is never actually had syncope.  Because of her prior episode of PAF I am going to get a 30-day event monitor to further evaluate

## 2019-08-02 NOTE — Progress Notes (Signed)
08/02/2019 [name]   [birth year]  MD:[id number]  Primary Physician Caren Macadam, MD Primary Cardiologist: Lorretta Harp MD Lupe Carney, [place]  HPI:  Joanne Hansen is a 73 y.o. thin appearing married Caucasian female mother of 1 child who recently relocated from the [place] near [place] to [place] to be closer to family.  She is retired from doing clerical work.  She was referred by Dr. Ethlyn Gallery , her PCP, for episodes of dizziness and remote PAF.  Her only risk factor is mild hyperlipidemia on statin therapy.  Her father did have a myocardial infarction at age 22 had bypass surgery.  She is never smoked.  She is never had a heart attack or stroke.  She denies chest pain or shortness of breath but she does have fatigue.  She developed breast cancer in September and had lumpectomy and has just finished her last round of chemotherapy and scheduled to start radiation therapy.  She feels fatigued.  She said 6 episodes of dizziness since starting chemotherapy without frank syncope.  She did have PAF demonstrated by Holter monitor in [place] in 2014.  She was followed by a cardiologist there who did not anticoagulate her.  Since that time she has had infrequent episodes of brief tachypalpitations.   Current Meds  Medication Sig  . Calcium-Phosphorus-Vitamin D (CITRACAL CALCIUM GUMMIES PO) Take 2 each by mouth daily.  . Cyanocobalamin (VITAMIN B 12 PO) Take 1,000 mg by mouth daily. sublingal  . dexamethasone (DECADRON) 4 MG tablet Take 2 tablets (8 mg total) by mouth 2 (two) times daily. Start the day before Taxotere. Then again the day after chemo for 3 days.  Marland Kitchen HYDROcodone-acetaminophen (NORCO) 5-325 MG tablet Take 1 tablet by mouth every 6 (six) hours as needed for moderate pain.  Marland Kitchen lidocaine-prilocaine (EMLA) cream Apply to affected area once  . loratadine (CLARITIN) 10 MG tablet Take 1 tablet (10 mg total) by mouth daily.  Marland Kitchen LORazepam (ATIVAN) 0.5 MG  tablet Take 1 tablet (0.5 mg total) by mouth at bedtime as needed (Nausea or vomiting).  . methylcellulose oral powder Take by mouth every evening.   . metoprolol tartrate (LOPRESSOR) 25 MG tablet Take 1/2 (one-half) tablet by mouth twice daily  . metroNIDAZOLE (METROCREAM) 0.75 % cream Apply topically 2 (two) times daily.  . ondansetron (ZOFRAN) 8 MG tablet Take 1 tablet (8 mg total) by mouth 2 (two) times daily as needed for refractory nausea / vomiting. Start on day 3 after chemo.  . prochlorperazine (COMPAZINE) 10 MG tablet Take 1 tablet (10 mg total) by mouth every 6 (six) hours as needed (Nausea or vomiting).  . simvastatin (ZOCOR) 40 MG tablet Take 1 tablet by mouth once daily   Current Facility-Administered Medications for the 08/02/19 encounter (Office Visit) with Lorretta Harp, MD  Medication  . ondansetron (ZOFRAN) injection 4 mg     Allergies  Allergen Reactions  . Penicillins Swelling  . Sulfa Antibiotics Nausea And Vomiting    Social History   Socioeconomic History  . Marital status: Married    Spouse name: Not on file  . Number of children: Not on file  . Years of education: Not on file  . Highest education level: Not on file  Occupational History  . Not on file  Tobacco Use  . Smoking status: Never Smoker  . Smokeless tobacco: Never Used  Substance and Sexual Activity  . Alcohol use: Yes  . Drug use:  Yes    Types: Amphetamines  . Sexual activity: Not on file  Other Topics Concern  . Not on file  Social History Narrative  . Not on file   Social Determinants of Health   Financial Resource Strain:   . Difficulty of Paying Living Expenses: Not on file  Food Insecurity:   . Worried About Charity fundraiser in the Last Year: Not on file  . Ran Out of Food in the Last Year: Not on file  Transportation Needs: No Transportation Needs  . Lack of Transportation (Medical): No  . Lack of Transportation (Non-Medical): No  Physical Activity:   . Days of  Exercise per Week: Not on file  . Minutes of Exercise per Session: Not on file  Stress:   . Feeling of Stress : Not on file  Social Connections:   . Frequency of Communication with Friends and Family: Not on file  . Frequency of Social Gatherings with Friends and Family: Not on file  . Attends Religious Services: Not on file  . Active Member of Clubs or Organizations: Not on file  . Attends Archivist Meetings: Not on file  . Marital Status: Not on file  Intimate Partner Violence:   . Fear of Current or Ex-Partner: Not on file  . Emotionally Abused: Not on file  . Physically Abused: Not on file  . Sexually Abused: Not on file     Review of Systems: General: negative for chills, fever, night sweats or weight changes.  Cardiovascular: negative for chest pain, dyspnea on exertion, edema, orthopnea, palpitations, paroxysmal nocturnal dyspnea or shortness of breath Dermatological: negative for rash Respiratory: negative for cough or wheezing Urologic: negative for hematuria Abdominal: negative for nausea, vomiting, diarrhea, bright red blood per rectum, melena, or hematemesis Neurologic: negative for visual changes, syncope, or dizziness All other systems reviewed and are otherwise negative except as noted above.    Blood pressure 114/69, pulse 84, height 5' 2.25" (1.581 m), weight 132 lb 9.6 oz (60.1 kg).  General appearance: alert and no distress Neck: no adenopathy, no carotid bruit, no JVD, supple, symmetrical, trachea midline and thyroid not enlarged, symmetric, no tenderness/mass/nodules Lungs: clear to auscultation bilaterally Heart: regular rate and rhythm, S1, S2 normal, no murmur, click, rub or gallop Extremities: extremities normal, atraumatic, no cyanosis or edema Pulses: 2+ and symmetric Skin: Skin color, texture, turgor normal. No rashes or lesions Neurologic: Alert and oriented X 3, normal strength and tone. Normal symmetric reflexes. Normal coordination and  gait  EKG not performed today.  Her last EKG performed May 09, 2019 revealed sinus rhythm at 63 with nonspecific ST and T wave changes.  ASSESSMENT AND PLAN:   Hyperlipidemia History of hyperlipidemia on statin therapy with lipid profile performed 07/02/2019 revealing total cholesterol 159, LDL 79 and HDL 54.  Paroxysmal atrial fibrillation (HCC) History of PAF demonstrated by Holter monitoring back in 2014 followed by cardiologist there.  She was never anticoagulated.  Her episodes were very infrequent.  Dizziness Ms. Gallaway has had episodes of dizziness since beginning chemotherapy in September.  She is had approximately a half a dozen.  She is never actually had syncope.  Because of her prior episode of PAF I am going to get a 30-day event monitor to further evaluate  Lower extremity edema New onset dependent edema over the last several months and starting chemotherapy.  I am to get a 2D echo to further evaluate LV function.      Pearletha Forge.  Gwenlyn Found MD [facility], [facility] 08/02/2019 3:22 PM

## 2019-08-02 NOTE — Assessment & Plan Note (Signed)
History of PAF demonstrated by Holter monitoring back in 2014 followed by cardiologist there.  She was never anticoagulated.  Her episodes were very infrequent.

## 2019-08-03 ENCOUNTER — Other Ambulatory Visit: Payer: Self-pay

## 2019-08-03 ENCOUNTER — Inpatient Hospital Stay: Payer: Medicare Other

## 2019-08-03 VITALS — BP 112/68 | HR 66 | Temp 98.6°F | Resp 16

## 2019-08-03 DIAGNOSIS — C50211 Malignant neoplasm of upper-inner quadrant of right female breast: Secondary | ICD-10-CM

## 2019-08-03 DIAGNOSIS — Z5111 Encounter for antineoplastic chemotherapy: Secondary | ICD-10-CM | POA: Diagnosis not present

## 2019-08-03 MED ORDER — HEPARIN SOD (PORK) LOCK FLUSH 100 UNIT/ML IV SOLN
500.0000 [IU] | Freq: Once | INTRAVENOUS | Status: AC | PRN
  Administered 2019-08-03: 500 [IU]
  Filled 2019-08-03: qty 5

## 2019-08-03 MED ORDER — SODIUM CHLORIDE 0.9 % IV SOLN
Freq: Once | INTRAVENOUS | Status: AC
  Administered 2019-08-03: 08:00:00 via INTRAVENOUS
  Filled 2019-08-03: qty 250

## 2019-08-03 MED ORDER — SODIUM CHLORIDE 0.9% FLUSH
10.0000 mL | Freq: Once | INTRAVENOUS | Status: AC | PRN
  Administered 2019-08-03: 10 mL
  Filled 2019-08-03: qty 10

## 2019-08-04 ENCOUNTER — Inpatient Hospital Stay: Payer: Medicare Other

## 2019-08-04 VITALS — BP 95/55 | HR 79 | Temp 97.3°F | Resp 16

## 2019-08-04 DIAGNOSIS — Z5111 Encounter for antineoplastic chemotherapy: Secondary | ICD-10-CM | POA: Diagnosis not present

## 2019-08-04 DIAGNOSIS — Z171 Estrogen receptor negative status [ER-]: Secondary | ICD-10-CM

## 2019-08-04 MED ORDER — SODIUM CHLORIDE 0.9% FLUSH
10.0000 mL | Freq: Once | INTRAVENOUS | Status: AC | PRN
  Administered 2019-08-04: 10 mL
  Filled 2019-08-04: qty 10

## 2019-08-04 MED ORDER — SODIUM CHLORIDE 0.9 % IV SOLN
Freq: Once | INTRAVENOUS | Status: AC
  Administered 2019-08-04: 09:00:00 via INTRAVENOUS
  Filled 2019-08-04: qty 250

## 2019-08-04 MED ORDER — HEPARIN SOD (PORK) LOCK FLUSH 100 UNIT/ML IV SOLN
500.0000 [IU] | Freq: Once | INTRAVENOUS | Status: AC | PRN
  Administered 2019-08-04: 10:00:00 500 [IU]
  Filled 2019-08-04: qty 5

## 2019-08-04 NOTE — Patient Instructions (Signed)
Dehydration, Adult  Dehydration is when there is not enough fluid or water in your body. This happens when you lose more fluids than you take in. Dehydration can range from mild to very bad. It should be treated right away to keep it from getting very bad. Symptoms of mild dehydration may include:  Thirst.  Dry lips.  Slightly dry mouth.  Dry, warm skin.  Dizziness. Symptoms of moderate dehydration may include:  Very dry mouth.  Muscle cramps.  Dark pee (urine). Pee may be the color of tea.  Your body making less pee.  Your eyes making fewer tears.  Heartbeat that is uneven or faster than normal (palpitations).  Headache.  Light-headedness, especially when you stand up from sitting.  Fainting (syncope). Symptoms of very bad dehydration may include:  Changes in skin, such as: ? Cold and clammy skin. ? Blotchy (mottled) or pale skin. ? Skin that does not quickly return to normal after being lightly pinched and let go (poor skin turgor).  Changes in body fluids, such as: ? Feeling very thirsty. ? Your eyes making fewer tears. ? Not sweating when body temperature is high, such as in hot weather. ? Your body making very little pee.  Changes in vital signs, such as: ? Weak pulse. ? Pulse that is more than 100 beats a minute when you are sitting still. ? Fast breathing. ? Low blood pressure.  Other changes, such as: ? Sunken eyes. ? Cold hands and feet. ? Confusion. ? Lack of energy (lethargy). ? Trouble waking up from sleep. ? Short-term weight loss. ? Unconsciousness. Follow these instructions at home:   If told by your doctor, drink an ORS: ? Make an ORS by using instructions on the package. ? Start by drinking small amounts, about  cup (120 mL) every 5-10 minutes. ? Slowly drink more until you have had the amount that your doctor said to have.  Drink enough clear fluid to keep your pee clear or pale yellow. If you were told to drink an ORS, finish the  ORS first, then start slowly drinking clear fluids. Drink fluids such as: ? Water. Do not drink only water by itself. Doing that can make the salt (sodium) level in your body get too low (hyponatremia). ? Ice chips. ? Fruit juice that you have added water to (diluted). ? Low-calorie sports drinks.  Avoid: ? Alcohol. ? Drinks that have a lot of sugar. These include high-calorie sports drinks, fruit juice that does not have water added, and soda. ? Caffeine. ? Foods that are greasy or have a lot of fat or sugar.  Take over-the-counter and prescription medicines only as told by your doctor.  Do not take salt tablets. Doing that can make the salt level in your body get too high (hypernatremia).  Eat foods that have minerals (electrolytes). Examples include bananas, oranges, potatoes, tomatoes, and spinach.  Keep all follow-up visits as told by your doctor. This is important. Contact a doctor if:  You have belly (abdominal) pain that: ? Gets worse. ? Stays in one area (localizes).  You have a rash.  You have a stiff neck.  You get angry or annoyed more easily than normal (irritability).  You are more sleepy than normal.  You have a harder time waking up than normal.  You feel: ? Weak. ? Dizzy. ? Very thirsty.  You have peed (urinated) only a small amount of very dark pee during 6-8 hours. Get help right away if:  You have   symptoms of very bad dehydration.  You cannot drink fluids without throwing up (vomiting).  Your symptoms get worse with treatment.  You have a fever.  You have a very bad headache.  You are throwing up or having watery poop (diarrhea) and it: ? Gets worse. ? Does not go away.  You have blood or something green (bile) in your throw-up.  You have blood in your poop (stool). This may cause poop to look black and tarry.  You have not peed in 6-8 hours.  You pass out (faint).  Your heart rate when you are sitting still is more than 100 beats a  minute.  You have trouble breathing. This information is not intended to replace advice given to you by your health care provider. Make sure you discuss any questions you have with your health care provider. Document Released: 06/05/2009 Document Revised: 07/22/2017 Document Reviewed: 10/03/2015 Elsevier Patient Education  2020 Elsevier Inc.  

## 2019-08-05 ENCOUNTER — Encounter (INDEPENDENT_AMBULATORY_CARE_PROVIDER_SITE_OTHER): Payer: Medicare Other

## 2019-08-05 DIAGNOSIS — R0602 Shortness of breath: Secondary | ICD-10-CM

## 2019-08-05 DIAGNOSIS — R609 Edema, unspecified: Secondary | ICD-10-CM | POA: Diagnosis not present

## 2019-08-10 ENCOUNTER — Ambulatory Visit: Payer: Self-pay | Admitting: Surgery

## 2019-08-10 NOTE — H&P (Signed)
Joanne Hansen Documented: 08/10/2019 9:49 AM Location: [facility] Patient #: [id number] DOB: [birth year] Married / Language: [name] / Race: White Female  History of Present Illness Marcello Moores A. Florine Sprenkle MD; 08/10/2019 10:18 AM) Patient words: Patient returns for follow-up of her right breast cancer. She has completed chemotherapy and requests port removal. She will begin radiation therapy in the next couple weeks. She complains of left hip pain which is being followed by the cancer center. Chemotherapy has made this worse. She has a history of left hip osteoarthritis and was told she will eventually require hip replacement. He thinks the pain was has worsened during chemotherapy since it was doing well prior to treatment.  The patient is a 73 year old female.   Allergies (Tanisha A. Owens Shark, [place]; 08/10/2019 9:50 AM) Penicillins  Medication History (Tanisha A. Owens Shark, [place]; 08/10/2019 9:50 AM) Simvastatin (40MG  Tablet, Oral) Active. LORazepam (0.5MG  Tablet, Oral) Active. Lidocaine-Prilocaine (2.5-2.5% Cream, External) Active. Ondansetron HCl (8MG  Tablet, Oral) Active. Prochlorperazine Maleate (10MG  Tablet, Oral) Active. Simvastatin (10MG  Tablet, Oral) Active. Metoprolol Succinate ER (50MG  Tablet ER 24HR, Oral) Active. Calcium (500MG  Tablet, Oral) Active. B Complex w/B-12 (Oral) Active. Medications Reconciled    Vitals (Tanisha A. Brown RMA; 08/10/2019 9:50 AM) 08/10/2019 9:49 AM Weight: 131.2 lb Height: 62in Body Surface Area: 1.6 m Body Mass Index: 24 kg/m  Temp.: 97.77F  Pulse: 99 (Regular)  BP: 128/84 (Sitting, Left Arm, Standard)        Physical Exam (Jaliana Medellin A. Deckard Stuber MD; 08/10/2019 10:18 AM)  General Mental Status-Alert. General Appearance-Consistent with stated age. Hydration-Well hydrated. Voice-Normal.  Breast Note: Right-sided porch clean dry and intact. Surgical site is clean and the incisions are healing  well to her breast and axilla.  Neurologic Neurologic evaluation reveals -alert and oriented x 3 with no impairment of recent or remote memory. Mental Status-Normal.  Musculoskeletal Normal Exam - Left-Upper Extremity Strength Normal and Lower Extremity Strength Normal. Normal Exam - Right-Upper Extremity Strength Normal and Lower Extremity Strength Normal.    Assessment & Plan (Deann Mclaine A. Cataldo Cosgriff MD; 08/10/2019 10:19 AM)  BREAST CANCER, RIGHT (C50.911) Impression: port removal since chemotherapy completed May require evaluation of left hip depending on how her pain does.  Current Plans Pt Education - [facility] Free Text Education/Instructions: discussed with patient and provided information. I recommended surgery to remove the catheter. I explained the technique of removal with use of local anesthesia & possible need for more aggressive sedation/anesthesia for patient comfort.  Risks such as bleeding, infection, and other risks were discussed. Post-operative dressing/incision care was discussed. I noted a good likelihood this will help address the problem. We will work to minimize complications. Questions were answered. The patient expresses understanding & wishes to proceed with surgery.

## 2019-08-14 ENCOUNTER — Encounter: Payer: Self-pay | Admitting: Radiation Oncology

## 2019-08-14 ENCOUNTER — Other Ambulatory Visit: Payer: Self-pay

## 2019-08-14 ENCOUNTER — Encounter: Payer: Self-pay | Admitting: *Deleted

## 2019-08-14 ENCOUNTER — Ambulatory Visit
Admission: RE | Admit: 2019-08-14 | Discharge: 2019-08-14 | Disposition: A | Discharge status: Home / Self Care | Payer: Medicare Other | Source: Ambulatory Visit | Attending: Radiation Oncology | Admitting: Radiation Oncology

## 2019-08-14 DIAGNOSIS — C50211 Malignant neoplasm of upper-inner quadrant of right female breast: Secondary | ICD-10-CM | POA: Diagnosis present

## 2019-08-14 DIAGNOSIS — Z171 Estrogen receptor negative status [ER-]: Secondary | ICD-10-CM | POA: Diagnosis not present

## 2019-08-14 NOTE — Patient Instructions (Signed)
Coronavirus (COVID-19) Are you at risk?  Are you at risk for the Coronavirus (COVID-19)?  To be considered HIGH RISK for Coronavirus (COVID-19), you have to meet the following criteria:  . Traveled to [place], [place], [place], [place] or [place]; or in the [place] to [place], [place], [place], or [place]; and have fever, cough, and shortness of breath within the last 2 weeks of travel OR . Been in close contact with a person diagnosed with COVID-19 within the last 2 weeks and have fever, cough, and shortness of breath . IF YOU DO NOT MEET THESE CRITERIA, YOU ARE CONSIDERED LOW RISK FOR COVID-19.  What to do if you are HIGH RISK for COVID-19?  . If you are having a medical emergency, call 911. . Seek medical care right away. Before you go to a doctor's office, urgent care or emergency department, call ahead and tell them about your recent travel, contact with someone diagnosed with COVID-19, and your symptoms. You should receive instructions from your physician's office regarding next steps of care.  . When you arrive at healthcare provider, tell the healthcare staff immediately you have returned from visiting [place], [place], [place], [place] or [place]; or traveled in the [place] to [place], [place], [place], or [place]; in the last two weeks or you have been in close contact with a person diagnosed with COVID-19 in the last 2 weeks.   . Tell the health care staff about your symptoms: fever, cough and shortness of breath. . After you have been seen by a medical provider, you will be either: o Tested for (COVID-19) and discharged home on quarantine except to seek medical care if symptoms worsen, and asked to  - Stay home and avoid contact with others until you get your results (4-5 days)  - Avoid travel on public transportation if possible (such as bus, train, or airplane) or o Sent to the Emergency Department by EMS for evaluation, COVID-19 testing, and possible  admission depending on your condition and test results.  What to do if you are LOW RISK for COVID-19?  Reduce your risk of any infection by using the same precautions used for avoiding the common cold or flu:  . Wash your hands often with soap and warm water for at least 20 seconds.  If soap and water are not readily available, use an alcohol-based hand sanitizer with at least 60% alcohol.  . If coughing or sneezing, cover your mouth and nose by coughing or sneezing into the elbow areas of your shirt or coat, into a tissue or into your sleeve (not your hands). . Avoid shaking hands with others and consider head nods or verbal greetings only. . Avoid touching your eyes, nose, or mouth with unwashed hands.  . Avoid close contact with people who are sick. . Avoid places or events with large numbers of people in one location, like concerts or sporting events. . Carefully consider travel plans you have or are making. . If you are planning any travel outside or inside the [place], visit the CDC's Travelers' Health webpage for the latest health notices. . If you have some symptoms but not all symptoms, continue to monitor at home and seek medical attention if your symptoms worsen. . If you are having a medical emergency, call 911.   ADDITIONAL HEALTHCARE OPTIONS FOR PATIENTS  [facility] / e-Visit: [email or web address]         [facility]: [phone]  [facility]: [phone]                   [facility]: [phone]   

## 2019-08-14 NOTE — Progress Notes (Signed)
Radiation Oncology         (336) [phone] ________________________________  Name: Joanne Hansen        MRN: [id number]  Date of Service: 08/14/2019 DOB: 16-Jun-1946  [id number], Steele Berg, MD  Wilber Bihari Cornett*     REFERRING PHYSICIAN: Wilber Bihari Cornett*   DIAGNOSIS: The encounter diagnosis was Malignant neoplasm of upper-inner quadrant of right breast in female, estrogen receptor negative ([place]).   HISTORY OF PRESENT ILLNESS: Joanne Hansen is a 73 y.o. female with a recent diagnosis of right breast cancer. The patient was noted to have a screening detected mass in the 1:00 position of the right breast. Further diagnostic workup revealed an 8 x 5 x 4 mm, and a 4 x 4 x 4 mm mass like change, it was possible the two were contiguous. Her axilla was negative for adenopathy. 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  She did undergo her lumpectomy on 05/15/2019 which revealed a grade 3 invasive ductal carcinoma measuring 1 cm.  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  Her margins were clear, and 2 sampled lymph nodes were negative for disease. Because of the final size of her tumor, she began systemic chemotherapy x 4 cycles between May 29, 2019 and completed on July 31, 2019.  When we last saw her she was also being worked up for an anorectal cyst and underwent EUA with resection on 05/18/2019 revealing a benign polyp. She is seen today to discuss options of adjuvant radiotherapy. Of note she is being worked up for a syncopal episode and has a 30 day heart monitor in place that was applied on 08/05/2019 with Dr. Kennon Holter office. She is curious how this could impact her treatment.   PREVIOUS RADIATION THERAPY: No   PAST MEDICAL HISTORY:  Past Medical History:  Diagnosis Date  . Arthritis   . Family history of brain cancer   . Family history of breast cancer   . Family history of colon  cancer   . Fecal incontinence    05-16-2019 per pt only a little leakage but controllable (was in clinical trial for [facility] without benefit.)  . Frequent headaches   . History of syncope    pre-syncope  08/ 2015  per pt no issue with this since   . Hyperlipidemia   . Malignant neoplasm of upper-inner quadrant of right breast in female, estrogen receptor negative [facility]) oncologist-- dr Jana Hakim  dr moody   dx 08/ 2020--  invasive ductal carcinoma,  05-13-2019  s/p right breast lumpectomy   . PAF (paroxysmal atrial fibrillation) ([place]) (05-16-2019   pt from [place], recently moved to [place] to be near daughter, has not established a cardiology yet--  previously seen by dr Lowella Dandy cossu (last office note dated 02-28-2018 scanned in epic)---  echo 04-15-2014  ef 55-60%, G1DD mild AR without stenosis, RVSp 21mmHg  . Rectal cyst   . Thyroid goiter    pt ultrasound in epic 06-04-2014 , nodules, no bx done  . Urinary incontinence, mixed        PAST SURGICAL HISTORY: Past Surgical History:  Procedure Laterality Date  . ABDOMINAL HYSTERECTOMY  1978   w/  RSO  and APPENDECTOMY  . BREAST LUMPECTOMY WITH RADIOACTIVE SEED AND SENTINEL LYMPH NODE BIOPSY Right 05/15/2019   Procedure: RIGHT BREAST LUMPECTOMY WITH RADIOACTIVE SEED AND SENTINEL LYMPH NODE MAPPING;  Surgeon: Erroll Luna, MD;  Location: [facility];  Service: General;  Laterality: Right;  . BREAST SURGERY  2011   biospy of right breast  . CATARACT EXTRACTION W/ INTRAOCULAR LENS  IMPLANT, BILATERAL  2004  . FLEXIBLE SIGMOIDOSCOPY N/A 05/18/2019   Procedure: FLEXIBLE SIGMOIDOSCOPY,  TRANSANAL EXCISION inclusion cyst;  Surgeon: Leighton Ruff, MD;  Location: [facility];  Service: General;  Laterality: N/A;  . gluteus medius repair  2017  . LUMBAR LAMINECTOMY  2013   L5 -- S1  . PORTACATH PLACEMENT Right 05/15/2019   Procedure: INSERTION PORT-A-CATH WITH ULTRASOUND;  Surgeon: Erroll Luna, MD;  Location: [place];  Service: General;  Laterality: Right;  . ROTATOR CUFF REPAIR Left 2001     FAMILY HISTORY:  Family History  Problem Relation Age of Onset  . COPD Mother   . Cancer Mother 32       colon  . Arthritis Mother   . Hypertension Mother   . Miscarriages / [place] Mother   . Stroke Mother 75  . Alzheimer's disease Mother 64  . Heart disease Father   . Heart attack Father 65  . Lung disease Father   . Arthritis Sister   . Breast cancer Sister 36       Lumpectomy  . Diabetes Maternal Grandmother   . Breast cancer Niece 41       Louise's Daughter  . Cervical cancer Niece 55     SOCIAL HISTORY:  reports that she has never smoked. She has never used smokeless tobacco. She reports current alcohol use. She reports current drug use. Drug: Amphetamines. The patient is married and lives in [place]. She relocated from [place] about a year ago.    ALLERGIES: Penicillins and Sulfa antibiotics   MEDICATIONS:  Current Outpatient Medications  Medication Sig Dispense Refill  . Calcium-Phosphorus-Vitamin D (CITRACAL CALCIUM GUMMIES PO) Take 2 each by mouth daily.    . Cyanocobalamin (VITAMIN B 12 PO) Take 1,000 mg by mouth daily. sublingal    . dexamethasone (DECADRON) 4 MG tablet Take 2 tablets (8 mg total) by mouth 2 (two) times daily. Start the day before Taxotere. Then again the day after chemo for 3 days. 30 tablet 1  . HYDROcodone-acetaminophen (NORCO) 5-325 MG tablet Take 1 tablet by mouth every 6 (six) hours as needed for moderate pain. 30 tablet 0  . lidocaine-prilocaine (EMLA) cream Apply to affected area once 30 g 3  . loratadine (CLARITIN) 10 MG tablet Take 1 tablet (10 mg total) by mouth daily. 90 tablet 0  . LORazepam (ATIVAN) 0.5 MG tablet Take 1 tablet (0.5 mg total) by mouth at bedtime as needed (Nausea or vomiting). 30 tablet 0  . methylcellulose oral powder Take by mouth every evening.     . metoprolol tartrate (LOPRESSOR) 25 MG tablet Take 1/2  (one-half) tablet by mouth twice daily 90 tablet 0  . metroNIDAZOLE (METROCREAM) 0.75 % cream Apply topically 2 (two) times daily. 45 g 0  . ondansetron (ZOFRAN) 8 MG tablet Take 1 tablet (8 mg total) by mouth 2 (two) times daily as needed for refractory nausea / vomiting. Start on day 3 after chemo. 30 tablet 1  . prochlorperazine (COMPAZINE) 10 MG tablet Take 1 tablet (10 mg total) by mouth every 6 (six) hours as needed (Nausea or vomiting). 30 tablet 1  . simvastatin (ZOCOR) 40 MG tablet Take 1 tablet by mouth once daily 90 tablet 1   Current Facility-Administered Medications  Medication Dose Route Frequency Provider Last Rate  Last Admin  . ondansetron (ZOFRAN) injection 4 mg  4 mg Intravenous Once PRN Magrinat, Virgie Dad, MD         REVIEW OF SYSTEMS: On review of systems, the patient reports that she is doing well overall. She has had some feelings of anxiety and depression since completing radiotherapy. She denies any chest pain, shortness of breath, cough, fevers, chills, night sweats, unintended weight changes. She denies any bowel or bladder disturbances, and denies abdominal pain, nausea or vomiting. She denies any new musculoskeletal or joint aches or pains. A complete review of systems is obtained and is otherwise negative.   PHYSICAL EXAM:  Wt Readings from Last 3 Encounters:  08/02/19 132 lb 9.6 oz (60.1 kg)  07/31/19 133 lb 9.6 oz (60.6 kg)  07/10/19 130 lb 4.8 oz (59.1 kg)    In general this is a well appearing caucasian female in no acute distress. She's alert and oriented x4 and appropriate throughout the examination. Cardiopulmonary assessment is negative for acute distress and she exhibits normal effort. Breast exam is deferred. She is able to show [place] on mychart the cardiac monitor that spans what looks to be about 4-5" wide and crosses over the midline though the majority is over the left breast.   ECOG = 0  0 - Asymptomatic (Fully active, able to carry on all predisease  activities without restriction)  1 - Symptomatic but completely ambulatory (Restricted in physically strenuous activity but ambulatory and able to carry out work of a light or sedentary nature. For example, light housework, office work)  2 - Symptomatic, <50% in bed during the day (Ambulatory and capable of all self care but unable to carry out any work activities. Up and about more than 50% of waking hours)  3 - Symptomatic, >50% in bed, but not bedbound (Capable of only limited self-care, confined to bed or chair 50% or more of waking hours)  4 - Bedbound (Completely disabled. Cannot carry on any self-care. Totally confined to bed or chair)  5 - Death   Eustace Pen MM, Creech RH, Tormey DC, et al. [phone]). "Toxicity and response criteria of the [facility]". [place] Oncol. 5 (6): 649-55    LABORATORY DATA:  Lab Results  Component Value Date   WBC 7.9 07/31/2019   HGB 9.8 (L) 07/31/2019   HCT 29.9 (L) 07/31/2019   MCV 103.1 (H) 07/31/2019   PLT 288 07/31/2019   Lab Results  Component Value Date   NA 142 07/31/2019   K 4.1 07/31/2019   CL 109 07/31/2019   CO2 23 07/31/2019   Lab Results  Component Value Date   ALT 10 07/31/2019   AST 11 (L) 07/31/2019   ALKPHOS 83 07/31/2019   BILITOT 0.4 07/31/2019      RADIOGRAPHY: No results found.     IMPRESSION/PLAN: 1. Stage IB, pT1bN0M0 grade 3, triple negative invasive ductal carcinoma of the right breast. Dr. Lisbeth Renshaw discusses the final pathology findings and reviews the nature of triple negative, right breast disease and her course since completing systemic therapy. She is ready now to proceed with adjuvant radiotherapy to the breast to reduce the risks of local recurrence. We discussed the risks, benefits, short, and long term effects of radiotherapy, and the patient is interested in proceeding. Dr. Lisbeth Renshaw discusses the delivery and logistics of radiotherapy and recommends a course of 4  weeks of radiotherapy.  She will come in this afternoon to sign consent and proceed with simulation.  2. Anorectal cyst and fecal urgency. The patient will follow up with Dr. Marcello Moores as she had a benign cyst. She will need a complete colonoscopy per report. We will follow this expectantly.  3. Heart monitor. I have reached out to Dr. Kennon Holter office to find out more about the position of this monitor and if it can be removed entirely during treatment. Dr. Lisbeth Renshaw discusses that we will plan to simulate today and hopefully be able to mark the area so it's out of the treatment field. The alternative would be to proceed once she's had the monitor removed.   This encounter was provided by telemedicine platform MyChart.  The patient has given verbal consent for this type of encounter and has been advised to only accept a meeting of this type in a secure network environment. The time spent during this encounter was 45 minutes. The attendants for this meeting include  Dr. Lisbeth Renshaw, Hayden Pedro  and [name].  Mr Amrhein her husband was also on the call.   During the encounter, Dr. Lisbeth Renshaw, and Hayden Pedro were located at [facility] Radiation Oncology Department.  [name] and her husband Jozee Croll were located at home.   The above documentation reflects my direct findings during this shared patient visit. Please see the separate note by Dr. Lisbeth Renshaw on this date for the remainder of the patient's plan of care.    Carola Rhine, PAC

## 2019-08-14 NOTE — Progress Notes (Signed)
Patient finished chemotherapy on December the 8th. Doing well. Denies any issues at this time.

## 2019-08-15 ENCOUNTER — Ambulatory Visit (HOSPITAL_COMMUNITY): Payer: Medicare Other | Attending: Cardiology

## 2019-08-15 ENCOUNTER — Telehealth: Payer: Self-pay | Admitting: *Deleted

## 2019-08-15 ENCOUNTER — Telehealth: Payer: Self-pay | Admitting: Radiation Oncology

## 2019-08-15 DIAGNOSIS — R0602 Shortness of breath: Secondary | ICD-10-CM | POA: Diagnosis present

## 2019-08-15 DIAGNOSIS — R609 Edema, unspecified: Secondary | ICD-10-CM | POA: Insufficient documentation

## 2019-08-15 NOTE — Telephone Encounter (Signed)
Spoke to [employer] PA for Dr.  She wanted to know if  Patient could move  Event monitor placement could be move more towards the left.  At the present time  Monitor is now placed somewhat on her sternum.  Bryson Ha states these placement may interfer with patient during  Radiation treatment Wanted to know if patient stop monitor , or move placement , or take it off during radiation treatment.  RN informed informed  Bryson Ha will contact her instructions.  RN spoke with monitor tech 1)Patient can take monitor off press "pause" have treatment the place it back on and press "resume" 2)if monitor irrates skin may move mor to the left place it horizontal arrow pointing up  And resume monitoring patient will need to use a new sticker.

## 2019-08-15 NOTE — Telephone Encounter (Signed)
I called and let the patient know on a VM we can proceed with XRT since her monitor sticker can be moved further to the left an off the sternum. I reiterated the instructions per cardiology's notes, and let her know to call if she has any questions.

## 2019-08-15 NOTE — Telephone Encounter (Signed)
Left message on secure voicemail for Alison,PA May call back if needed

## 2019-08-15 NOTE — Progress Notes (Signed)
  Radiation Oncology         [phone]) [phone] ________________________________  Name: Joanne Hansen MRN: [id number]  Date: 08/14/2019  DOB: [birth year]   DIAGNOSIS:     ICD-10-CM   1. Malignant neoplasm of upper-inner quadrant of right breast in female, estrogen receptor negative ([place])  C50.211    Z17.1     SIMULATION AND TREATMENT PLANNING NOTE  The patient presented for simulation prior to beginning her course of radiation treatment for her diagnosis of right-sided breast cancer. The patient was placed in a supine position on a breast board. A customized vac-lock bag was constructed and this complex treatment device will be used on a daily basis during her treatment. In this fashion, a CT scan was obtained through the chest area and an isocenter was placed near the chest wall within the breast.  The patient will be planned to receive a course of radiation initially to a dose of 42.56 Gy. This will consist of a whole breast radiotherapy technique. To accomplish this, 2 customized blocks have been designed which will correspond to medial and lateral whole breast tangent fields. This treatment will be accomplished at 2.66 Gy per fraction. A forward planning technique will also be evaluated to determine if this approach improves the plan. It is anticipated that the patient will then receive a 8 Gy boost to the seroma cavity which has been contoured. This will be accomplished at 2 Gy per fraction.   This initial treatment will consist of a 3-D conformal technique. The seroma has been contoured as the primary target structure. Additionally, dose volume histograms of both this target as well as the lungs and heart will also be evaluated. Such an approach is necessary to ensure that the target area is adequately covered while the nearby critical  normal structures are adequately spared.  Plan:  The final anticipated total dose therefore will correspond to 50.56  Gy.    _______________________________   Jodelle Gross, MD, PhD

## 2019-08-15 NOTE — Progress Notes (Signed)
  Radiation Oncology         [phone] ________________________________  Name: Joanne Hansen MRN: [id number]  Date: 08/14/2019  DOB: 19-Aug-1946  Optical Surface Tracking Plan:  Since intensity modulated radiotherapy (IMRT) and 3D conformal radiation treatment methods are predicated on accurate and precise positioning for treatment, intrafraction motion monitoring is medically necessary to ensure accurate and safe treatment delivery.  The ability to quantify intrafraction motion without excessive ionizing radiation dose can only be performed with optical surface tracking. Accordingly, surface imaging offers the opportunity to obtain 3D measurements of patient position throughout IMRT and 3D treatments without excessive radiation exposure.  I am ordering optical surface tracking for this patient's upcoming course of radiotherapy. ________________________________  Kyung Rudd, MD 08/15/2019 2:30 PM    Reference:   Ursula Alert, J, et al. Surface imaging-based analysis of intrafraction motion for breast radiotherapy patients.Journal of [place], n. 6, nov. 2014. ISSN [id number].   Available at: <http://www.jacmp.org/index.php/jacmp/article/view/4957>.

## 2019-08-20 ENCOUNTER — Telehealth: Payer: Self-pay

## 2019-08-20 NOTE — Telephone Encounter (Signed)
Spoke with patient due to concerns of new cough that started about 5 days ago along with feeling "out of breath".  She has no temp and wondering if this could be related to chemo (last day of treatment 12/8). Patient has scheduled a Virtual appt with her PCP at 10 am and has called to see "if that is the right thing to do".  This nurse encouraged patient to keep visit with PCP.  Message will be sent to RN for GM for follow up and recommendation if patient needs to be seen in clinic.

## 2019-08-21 ENCOUNTER — Ambulatory Visit: Payer: Medicare Other | Admitting: Radiation Oncology

## 2019-08-21 ENCOUNTER — Telehealth (INDEPENDENT_AMBULATORY_CARE_PROVIDER_SITE_OTHER): Payer: Medicare Other | Admitting: Family Medicine

## 2019-08-21 ENCOUNTER — Other Ambulatory Visit: Payer: Self-pay

## 2019-08-21 ENCOUNTER — Encounter: Payer: Self-pay | Admitting: Family Medicine

## 2019-08-21 VITALS — Temp 98.5°F | Wt 129.2 lb

## 2019-08-21 DIAGNOSIS — C50211 Malignant neoplasm of upper-inner quadrant of right female breast: Secondary | ICD-10-CM | POA: Diagnosis not present

## 2019-08-21 DIAGNOSIS — R059 Cough, unspecified: Secondary | ICD-10-CM

## 2019-08-21 DIAGNOSIS — R05 Cough: Secondary | ICD-10-CM

## 2019-08-21 MED ORDER — BENZONATATE 100 MG PO CAPS: 100.0000 mg | ORAL_CAPSULE | Freq: Two times a day (BID) | ORAL | 0 refills | Status: DC | PRN

## 2019-08-21 NOTE — Patient Instructions (Signed)
Make sure your oncologist is aware of your symptoms  Get the COVID19 test as we discussed.   Try flonase 2 sprays each nostril daily for [redacted] weeks along with the claritin. Tessalon for cough if needed.  -I sent the medication(s) we discussed to your pharmacy: Meds ordered this encounter  Medications  . benzonatate (TESSALON PERLES) 100 MG capsule    Sig: Take 1 capsule (100 mg total) by mouth 2 (two) times daily as needed.    Dispense:  20 capsule    Refill:  0    Please let us know if you have any questions or concerns regarding this prescription.  I hope you are feeling better soon! Seek care promptly if your symptoms worsen, new concerns arise or you are not improving with treatment.

## 2019-08-21 NOTE — Progress Notes (Signed)
Virtual Visit via Video Note  I connected with Joanne Hansen  on 08/21/19 at 10:00 AM EST by a video enabled telemedicine application and verified that I am speaking with the correct person using two identifiers.  Location patient: home Location provider:work or home office Persons participating in the virtual visit: patient, provider, husband   I discussed the limitations of evaluation and management by telemedicine and the availability of in person appointments. The patient expressed understanding and agreed to proceed.   HPI:  Acute visit for cough: -started: 6 days ago -symptoms include dry cough, ? mild SOB but has not been as active the last 4 months and she thought this may be from being out of shape, drainage in the throat  -denies nasal congestion, wheezing, fever, malaise, NVD, GERD -takes claritin -no known sick contacts, she is a cancer patient and finished chemo several weeks ago - she has contacted her oncologist about this as well, she did get together with her daughter with masks on, husband does go into stores - but he has been healthy -she does wonder about COVID - though has tried to be very careful -t 98.5 -actually is feeling better today and has not had much cough or symptoms this morning.  ROS: See pertinent positives and negatives per HPI.  Past Medical History:  Diagnosis Date  . Arthritis   . Family history of brain cancer   . Family history of breast cancer   . Family history of colon cancer   . Fecal incontinence    05-16-2019 per pt only a little leakage but controllable (was in clinical trial for [facility] without benefit.)  . Frequent headaches   . History of syncope    pre-syncope  08/ 2015  per pt no issue with this since   . Hyperlipidemia   . 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 PAF (paroxysmal atrial fibrillation) ([place]) (05-16-2019   pt from [place], recently moved to [place] to be near daughter, has not established a cardiology yet--  previously seen by dr Lowella Dandy cossu (last office note dated 02-28-2018 scanned in epic)---  echo 04-15-2014  ef 55-60%, G1DD mild AR without stenosis, RVSp 39mmHg  . Rectal cyst   . Thyroid goiter    pt ultrasound in epic 06-04-2014 , nodules, no bx done  . Urinary incontinence, mixed     Past Surgical History:  Procedure Laterality Date  . ABDOMINAL HYSTERECTOMY  1978   w/  RSO  and APPENDECTOMY  . BREAST LUMPECTOMY WITH RADIOACTIVE SEED AND SENTINEL LYMPH NODE BIOPSY Right 05/15/2019   Procedure: RIGHT BREAST LUMPECTOMY WITH RADIOACTIVE SEED AND SENTINEL LYMPH NODE MAPPING;  Surgeon: Erroll Luna, MD;  Location: [place];  Service: General;  Laterality: Right;  . BREAST SURGERY  2011   biospy of right breast  . CATARACT EXTRACTION W/ INTRAOCULAR LENS  IMPLANT, BILATERAL  2004  . FLEXIBLE SIGMOIDOSCOPY N/A 05/18/2019   Procedure: FLEXIBLE SIGMOIDOSCOPY,  TRANSANAL EXCISION inclusion cyst;  Surgeon: Leighton Ruff, MD;  Location: [facility];  Service: General;  Laterality: N/A;  . gluteus medius repair  2017  . LUMBAR LAMINECTOMY  2013   L5 -- S1  . PORTACATH PLACEMENT Right 05/15/2019   Procedure: INSERTION PORT-A-CATH WITH ULTRASOUND;  Surgeon: Erroll Luna, MD;  Location:  SUR[facility]ERY  [facility];  Service: General;  Laterality: Right;  . ROTATOR CUFF REPAIR Left 2001    Family History  Problem Relation Age of Onset  . COPD Mother   . Cancer Mother 59       colon  . Arthritis Mother   . Hypertension Mother   . Miscarriages / [place] Mother   . Stroke Mother 72  . Alzheimer's disease Mother 41  . Heart disease Father   . Heart attack Father 28  . Lung disease Father   . Arthritis Sister   . Breast cancer Sister 48       Lumpectomy  . Diabetes Maternal Grandmother    . Breast cancer Niece 28       Louise's Daughter  . Cervical cancer Niece 44    SOCIAL HX: see hpi   Current Outpatient Medications:  .  Calcium-Phosphorus-Vitamin D (CITRACAL CALCIUM GUMMIES PO), Take 2 each by mouth daily., Disp: , Rfl:  .  Cyanocobalamin (VITAMIN B 12 PO), Take 1,000 mg by mouth daily. sublingal, Disp: , Rfl:  .  HYDROcodone-acetaminophen (NORCO) 5-325 MG tablet, Take 1 tablet by mouth every 6 (six) hours as needed for moderate pain., Disp: 30 tablet, Rfl: 0 .  loratadine (CLARITIN) 10 MG tablet, Take 1 tablet (10 mg total) by mouth daily., Disp: 90 tablet, Rfl: 0 .  LORazepam (ATIVAN) 0.5 MG tablet, Take 1 tablet (0.5 mg total) by mouth at bedtime as needed (Nausea or vomiting)., Disp: 30 tablet, Rfl: 0 .  methylcellulose oral powder, Take by mouth every evening. , Disp: , Rfl:  .  metoprolol tartrate (LOPRESSOR) 25 MG tablet, Take 1/2 (one-half) tablet by mouth twice daily, Disp: 90 tablet, Rfl: 0 .  prochlorperazine (COMPAZINE) 10 MG tablet, Take 1 tablet (10 mg total) by mouth every 6 (six) hours as needed (Nausea or vomiting)., Disp: 30 tablet, Rfl: 1 .  simvastatin (ZOCOR) 40 MG tablet, Take 1 tablet by mouth once daily, Disp: 90 tablet, Rfl: 1 .  benzonatate (TESSALON PERLES) 100 MG capsule, Take 1 capsule (100 mg total) by mouth 2 (two) times daily as needed., Disp: 20 capsule, Rfl: 0  Current Facility-Administered Medications:  .  ondansetron (ZOFRAN) injection 4 mg, 4 mg, Intravenous, Once PRN, Magrinat, Virgie Dad, MD  EXAM:  VITALS per patient if applicable:see hpi  GENERAL: alert, oriented, appears well and in no acute distress  HEENT: atraumatic, conjunttiva clear, no obvious abnormalities on inspection of external nose and ears  NECK: normal movements of the head and neck  LUNGS: on inspection no signs of respiratory distress, breathing rate appears normal, no obvious gross SOB, gasping or wheezing, no cough during this visit, does clear throat a  few times  CV: no obvious cyanosis  MS: moves all visible extremities without noticeable abnormality  PSYCH/NEURO: pleasant and cooperative, no obvious depression or anxiety, speech and thought processing grossly intact  ASSESSMENT AND PLAN:  Discussed the following assessment and plan:  Cough  -we discussed possible serious and likely etiologies, options for evaluation and workup, limitations of telemedicine visit vs in person visit, treatment, treatment risks and precautions. Pt prefers to treat via telemedicine empirically rather then risking or undertaking an in person visit at this moment. She feels better today, had recent neg echo, and has been careful given the pandemic. We opted to do a COVID test given her concerns and discussed various options for testing which she will consider, limitations, etc. She will remain isolated. Since feeling better today opted to try  flonase for PND, tessalon and she has call into her oncologist as well. Patient agrees to seek prompt in person care if worsening, new symptoms arise, or if is not improving with treatment.   I discussed the assessment and treatment plan with the patient. The patient was provided an opportunity to ask questions and all were answered. The patient agreed with the plan and demonstrated an understanding of the instructions.   The patient was advised to call back or seek an in-person evaluation if the symptoms worsen or if the condition fails to improve as anticipated.   Lucretia Kern, DO

## 2019-08-22 ENCOUNTER — Ambulatory Visit: Payer: Medicare Other

## 2019-08-22 ENCOUNTER — Telehealth: Payer: Self-pay | Admitting: *Deleted

## 2019-08-22 DIAGNOSIS — C50211 Malignant neoplasm of upper-inner quadrant of right female breast: Secondary | ICD-10-CM | POA: Diagnosis not present

## 2019-08-23 ENCOUNTER — Ambulatory Visit: Payer: Medicare Other

## 2019-08-27 ENCOUNTER — Ambulatory Visit
Admission: RE | Admit: 2019-08-27 | Discharge: 2019-08-27 | Disposition: A | Discharge status: Home / Self Care | Payer: Medicare Other | Source: Ambulatory Visit | Attending: Radiation Oncology | Admitting: Radiation Oncology

## 2019-08-27 ENCOUNTER — Other Ambulatory Visit: Payer: Self-pay

## 2019-08-27 ENCOUNTER — Other Ambulatory Visit: Payer: Self-pay | Admitting: *Deleted

## 2019-08-27 DIAGNOSIS — Z171 Estrogen receptor negative status [ER-]: Secondary | ICD-10-CM | POA: Diagnosis present

## 2019-08-27 DIAGNOSIS — C50211 Malignant neoplasm of upper-inner quadrant of right female breast: Secondary | ICD-10-CM | POA: Insufficient documentation

## 2019-08-27 DIAGNOSIS — R05 Cough: Secondary | ICD-10-CM

## 2019-08-27 DIAGNOSIS — R059 Cough, unspecified: Secondary | ICD-10-CM

## 2019-08-28 ENCOUNTER — Ambulatory Visit
Admission: RE | Admit: 2019-08-28 | Discharge: 2019-08-28 | Disposition: A | Discharge status: Home / Self Care | Payer: Medicare Other | Source: Ambulatory Visit | Attending: Radiation Oncology | Admitting: Radiation Oncology

## 2019-08-28 ENCOUNTER — Ambulatory Visit (HOSPITAL_COMMUNITY)
Admission: RE | Admit: 2019-08-28 | Discharge: 2019-08-28 | Disposition: A | Discharge status: Home / Self Care | Payer: Medicare Other | Source: Ambulatory Visit | Attending: Oncology | Admitting: Oncology

## 2019-08-28 DIAGNOSIS — C50211 Malignant neoplasm of upper-inner quadrant of right female breast: Secondary | ICD-10-CM | POA: Diagnosis not present

## 2019-08-28 DIAGNOSIS — R059 Cough, unspecified: Secondary | ICD-10-CM

## 2019-08-28 DIAGNOSIS — R05 Cough: Secondary | ICD-10-CM | POA: Diagnosis present

## 2019-08-28 DIAGNOSIS — Z171 Estrogen receptor negative status [ER-]: Secondary | ICD-10-CM | POA: Insufficient documentation

## 2019-08-29 ENCOUNTER — Ambulatory Visit
Admission: RE | Admit: 2019-08-29 | Discharge: 2019-08-29 | Disposition: A | Discharge status: Home / Self Care | Payer: Medicare Other | Source: Ambulatory Visit | Attending: Radiation Oncology | Admitting: Radiation Oncology

## 2019-08-29 ENCOUNTER — Other Ambulatory Visit: Payer: Self-pay

## 2019-08-29 DIAGNOSIS — C50211 Malignant neoplasm of upper-inner quadrant of right female breast: Secondary | ICD-10-CM | POA: Diagnosis not present

## 2019-08-30 ENCOUNTER — Other Ambulatory Visit: Payer: Self-pay | Admitting: Family Medicine

## 2019-08-30 ENCOUNTER — Other Ambulatory Visit: Payer: Self-pay

## 2019-08-30 ENCOUNTER — Ambulatory Visit
Admission: RE | Admit: 2019-08-30 | Discharge: 2019-08-30 | Disposition: A | Discharge status: Home / Self Care | Payer: Medicare Other | Source: Ambulatory Visit | Attending: Radiation Oncology | Admitting: Radiation Oncology

## 2019-08-30 DIAGNOSIS — C50211 Malignant neoplasm of upper-inner quadrant of right female breast: Secondary | ICD-10-CM | POA: Diagnosis not present

## 2019-08-31 ENCOUNTER — Other Ambulatory Visit: Payer: Self-pay

## 2019-08-31 ENCOUNTER — Other Ambulatory Visit: Payer: Self-pay | Admitting: Family Medicine

## 2019-08-31 ENCOUNTER — Telehealth: Payer: Self-pay | Admitting: Family Medicine

## 2019-08-31 ENCOUNTER — Ambulatory Visit
Admission: RE | Admit: 2019-08-31 | Discharge: 2019-08-31 | Disposition: A | Discharge status: Home / Self Care | Payer: Medicare Other | Source: Ambulatory Visit | Attending: Radiation Oncology | Admitting: Radiation Oncology

## 2019-08-31 DIAGNOSIS — C50211 Malignant neoplasm of upper-inner quadrant of right female breast: Secondary | ICD-10-CM

## 2019-08-31 MED ORDER — SONAFINE EX EMUL
1.0000 "application " | Freq: Two times a day (BID) | CUTANEOUS | Status: DC
  Administered 2019-08-31: 1 via TOPICAL

## 2019-08-31 MED ORDER — BENZONATATE 100 MG PO CAPS: 100.0000 mg | ORAL_CAPSULE | Freq: Two times a day (BID) | ORAL | 2 refills | Status: DC | PRN

## 2019-08-31 NOTE — Telephone Encounter (Signed)
Pt called back in to follow up on refill request for med, pt says that it is urgent that she receives this medication because her cough has not completely cleared up. Pt says that she was told by her oncologist to be sure to continue medication.

## 2019-08-31 NOTE — Telephone Encounter (Signed)
Medication Refill - Medication: benzonatate (TESSALON PERLES) 100 MG capsule   Pt called and says that her oncologists recommends that she continues taking this medication for her cough. Please advise, pt is out of medication and wants refill before the weekend.   Has the patient contacted their pharmacy? Yes.   (Agent: If no, request that the patient contact the pharmacy for the refill.) (Agent: If yes, when and what did the pharmacy advise?)  Preferred Pharmacy (with phone number or street name):  [place], [place]  [place] [address]  Phone: [phone] Fax: [phone]     Agent: Please be advised that RX refills may take up to 3 business days. We ask that you follow-up with your pharmacy.

## 2019-08-31 NOTE — Telephone Encounter (Signed)
Message Routed to PCP CMA 

## 2019-09-03 ENCOUNTER — Encounter: Payer: Self-pay | Admitting: Family Medicine

## 2019-09-03 ENCOUNTER — Ambulatory Visit
Admission: RE | Admit: 2019-09-03 | Discharge: 2019-09-03 | Disposition: A | Discharge status: Home / Self Care | Payer: Medicare Other | Source: Ambulatory Visit | Attending: Radiation Oncology | Admitting: Radiation Oncology

## 2019-09-03 ENCOUNTER — Other Ambulatory Visit: Payer: Self-pay

## 2019-09-03 DIAGNOSIS — C50211 Malignant neoplasm of upper-inner quadrant of right female breast: Secondary | ICD-10-CM | POA: Diagnosis not present

## 2019-09-03 NOTE — Telephone Encounter (Signed)
I left a detailed message at the pts home number with the information below and asked that she call back if needed.

## 2019-09-03 NOTE — Telephone Encounter (Signed)
I sent this in on the 8th; can you make sure she got it?

## 2019-09-04 ENCOUNTER — Other Ambulatory Visit: Payer: Self-pay

## 2019-09-04 ENCOUNTER — Ambulatory Visit
Admission: RE | Admit: 2019-09-04 | Discharge: 2019-09-04 | Disposition: A | Discharge status: Home / Self Care | Payer: Medicare Other | Source: Ambulatory Visit | Attending: Radiation Oncology | Admitting: Radiation Oncology

## 2019-09-04 ENCOUNTER — Other Ambulatory Visit: Payer: Self-pay | Admitting: Family Medicine

## 2019-09-04 DIAGNOSIS — C50211 Malignant neoplasm of upper-inner quadrant of right female breast: Secondary | ICD-10-CM | POA: Diagnosis not present

## 2019-09-05 ENCOUNTER — Ambulatory Visit
Admission: RE | Admit: 2019-09-05 | Discharge: 2019-09-05 | Disposition: A | Discharge status: Home / Self Care | Payer: Medicare Other | Source: Ambulatory Visit | Attending: Radiation Oncology | Admitting: Radiation Oncology

## 2019-09-05 ENCOUNTER — Other Ambulatory Visit: Payer: Self-pay

## 2019-09-05 DIAGNOSIS — C50211 Malignant neoplasm of upper-inner quadrant of right female breast: Secondary | ICD-10-CM | POA: Diagnosis not present

## 2019-09-06 ENCOUNTER — Other Ambulatory Visit: Payer: Self-pay

## 2019-09-06 ENCOUNTER — Ambulatory Visit
Admission: RE | Admit: 2019-09-06 | Discharge: 2019-09-06 | Disposition: A | Discharge status: Home / Self Care | Payer: Medicare Other | Source: Ambulatory Visit | Attending: Radiation Oncology | Admitting: Radiation Oncology

## 2019-09-06 ENCOUNTER — Ambulatory Visit: Payer: Medicare Other

## 2019-09-06 DIAGNOSIS — C50211 Malignant neoplasm of upper-inner quadrant of right female breast: Secondary | ICD-10-CM | POA: Diagnosis not present

## 2019-09-07 ENCOUNTER — Ambulatory Visit
Admission: RE | Admit: 2019-09-07 | Discharge: 2019-09-07 | Disposition: A | Discharge status: Home / Self Care | Payer: Medicare Other | Source: Ambulatory Visit | Attending: Radiation Oncology | Admitting: Radiation Oncology

## 2019-09-07 ENCOUNTER — Other Ambulatory Visit: Payer: Self-pay

## 2019-09-07 DIAGNOSIS — C50211 Malignant neoplasm of upper-inner quadrant of right female breast: Secondary | ICD-10-CM | POA: Diagnosis not present

## 2019-09-10 ENCOUNTER — Ambulatory Visit
Admission: RE | Admit: 2019-09-10 | Discharge: 2019-09-10 | Disposition: A | Discharge status: Home / Self Care | Payer: Medicare Other | Source: Ambulatory Visit | Attending: Radiation Oncology | Admitting: Radiation Oncology

## 2019-09-10 ENCOUNTER — Other Ambulatory Visit: Payer: Self-pay

## 2019-09-10 DIAGNOSIS — C50211 Malignant neoplasm of upper-inner quadrant of right female breast: Secondary | ICD-10-CM | POA: Diagnosis not present

## 2019-09-11 ENCOUNTER — Ambulatory Visit
Admission: RE | Admit: 2019-09-11 | Discharge: 2019-09-11 | Disposition: A | Discharge status: Home / Self Care | Payer: Medicare Other | Source: Ambulatory Visit | Attending: Radiation Oncology | Admitting: Radiation Oncology

## 2019-09-11 ENCOUNTER — Other Ambulatory Visit: Payer: Self-pay

## 2019-09-11 ENCOUNTER — Telehealth: Payer: Self-pay | Admitting: *Deleted

## 2019-09-11 DIAGNOSIS — C50211 Malignant neoplasm of upper-inner quadrant of right female breast: Secondary | ICD-10-CM | POA: Diagnosis not present

## 2019-09-11 NOTE — Progress Notes (Signed)
Cardiology Clinic Note   Patient Name: Joanne Hansen Date of Encounter: 09/13/2019  Primary Care Provider:  Caren Macadam, MD Primary Cardiologist:  Quay Burow, MD  Patient Profile     Joanne Hansen 74 year old female presents today for evaluation of her PAF, hyperlipidemia, dizziness, and lower extremity edema.  Past Medical History    Past Medical History:  Diagnosis Date  . Arthritis   . Family history of brain cancer   . Family history of breast cancer   . Family history of colon cancer   . Fecal incontinence    05-16-2019 per pt only a little leakage but controllable (was in clinical trial for [facility] without benefit.)  . Frequent headaches   . History of syncope    pre-syncope  08/ 2015  per pt no issue with this since   . Hyperlipidemia   . Malignant neoplasm of upper-inner quadrant of right breast in female, estrogen receptor negative [facility]) oncologist-- dr Jana Hakim  dr moody   dx 08/ 2020--  invasive ductal carcinoma,  05-13-2019  s/p right breast lumpectomy   . PAF (paroxysmal atrial fibrillation) ([place]) (05-16-2019   pt from [place], recently moved to [place] to be near daughter, has not established a cardiology yet--  previously seen by dr Lowella Dandy cossu (last office note dated 02-28-2018 scanned in epic)---  echo 04-15-2014  ef 55-60%, G1DD mild AR without stenosis, RVSp 47mmHg  . Rectal cyst   . Thyroid goiter    pt ultrasound in epic 06-04-2014 , nodules, no bx done  . Urinary incontinence, mixed    Past Surgical History:  Procedure Laterality Date  . ABDOMINAL HYSTERECTOMY  1978   w/  RSO  and APPENDECTOMY  . BREAST LUMPECTOMY WITH RADIOACTIVE SEED AND SENTINEL LYMPH NODE BIOPSY Right 05/15/2019   Procedure: RIGHT BREAST LUMPECTOMY WITH RADIOACTIVE SEED AND SENTINEL LYMPH NODE MAPPING;  Surgeon: Erroll Luna, MD;  Location: [place];  Service: General;  Laterality: Right;  . BREAST SURGERY  2011   biospy of right breast  .  CATARACT EXTRACTION W/ INTRAOCULAR LENS  IMPLANT, BILATERAL  2004  . FLEXIBLE SIGMOIDOSCOPY N/A 05/18/2019   Procedure: FLEXIBLE SIGMOIDOSCOPY,  TRANSANAL EXCISION inclusion cyst;  Surgeon: Leighton Ruff, MD;  [facility];  Service: General;  Laterality: N/A;  . gluteus medius repair  2017  . LUMBAR LAMINECTOMY  2013   L5 -- S1  . PORTACATH PLACEMENT Right 05/15/2019   Procedure: INSERTION PORT-A-CATH WITH ULTRASOUND;  Surgeon: Erroll Luna, MD;  Location: [place];  Service: General;  Laterality: Right;  . ROTATOR CUFF REPAIR Left 2001    Allergies  Allergies  Allergen Reactions  . Penicillins Swelling  . Sulfa Antibiotics Nausea And Vomiting    History of Present Illness   Joanne Hansen has a past medical history of dizziness, breast cancer, and  remote paroxysmal atrial fibrillation.  She has not been anticoagulated for her PAF.  This was diagnosed by a Holter monitor in [place] in 2014.  She underwent lumpectomy in September 2020, then subsequently had chemotherapy, and was scheduled for radiation.  During her last office visit with Dr. Gwenlyn Found on 08/02/2019 she expressed feeling fatigued and having 6 episodes of dizziness that coincided with her chemotherapy treatment.    She presents the clinic today and states she has not had any further episodes of dizziness since completing her chemotherapy.  She continues to be active although she is not as active as she  was prior to cancer treatment.  She states she routinely walks between 2000 and 5000 steps daily.  She is also started to climb the cancer center stairs without having to take breaks.  She had a bout of bronchitis which she said she still occasionally notices cough from.  She is currently taking 1 Tessalon Perle per day and feels she is ready to stop this medication.  Her 30-day event monitor was reviewed.  There was no evidence of atrial fibrillation or unstable arrhythmias.  I will have her  continue to take her metoprolol tartrate until she completes her radiation.  It seems that her palpitations were caused by dehydration in combination with her cancer treatment.  She denies chest pain, shortness of breath, lower extremity edema, fatigue, palpitations, melena, hematuria, hemoptysis, diaphoresis, weakness, presyncope, syncope, orthopnea, and PND.   Home Medications    Prior to Admission medications   Medication Sig Start Date End Date Taking? Authorizing Provider  benzonatate (TESSALON PERLES) 100 MG capsule Take 1 capsule (100 mg total) by mouth 2 (two) times daily as needed. 08/31/19   Caren Macadam, MD  Calcium-Phosphorus-Vitamin D (CITRACAL CALCIUM GUMMIES PO) Take 2 each by mouth daily.    [provider]  Cyanocobalamin (VITAMIN B 12 PO) Take 1,000 mg by mouth daily. sublingal    [provider]  HYDROcodone-acetaminophen (NORCO) 5-325 MG tablet Take 1 tablet by mouth every 6 (six) hours as needed for moderate pain. 06/19/19   Magrinat, Virgie Dad, MD  loratadine (CLARITIN) 10 MG tablet Take 1 tablet (10 mg total) by mouth daily. 05/07/19   Magrinat, Virgie Dad, MD  LORazepam (ATIVAN) 0.5 MG tablet Take 1 tablet (0.5 mg total) by mouth at bedtime as needed (Nausea or vomiting). 07/10/19   Gardenia Phlegm, NP  methylcellulose oral powder Take by mouth every evening.     [provider]  metoprolol tartrate (LOPRESSOR) 25 MG tablet Take 1/2 (one-half) tablet by mouth twice daily 09/04/19   Koberlein, Steele Berg, MD  prochlorperazine (COMPAZINE) 10 MG tablet Take 1 tablet (10 mg total) by mouth every 6 (six) hours as needed (Nausea or vomiting). 05/07/19   Magrinat, Virgie Dad, MD  simvastatin (ZOCOR) 40 MG tablet Take 1 tablet by mouth once daily 07/25/19   Caren Macadam, MD    Family History    Family History  Problem Relation Age of Onset  . COPD Mother   . Cancer Mother 67       colon  . Arthritis Mother   . Hypertension Mother   .  Miscarriages / [place] Mother   . Stroke Mother 54  . Alzheimer's disease Mother 46  . Heart disease Father   . Heart attack Father 45  . Lung disease Father   . Arthritis Sister   . Breast cancer Sister 44       Lumpectomy  . Diabetes Maternal Grandmother   . Breast cancer Niece 76       Louise's Daughter  . Cervical cancer Niece 64   She indicated that her mother is deceased. She indicated that her father is deceased. She indicated that all of her three sisters are alive. She indicated that her maternal grandmother is deceased. She indicated that her maternal grandfather is deceased. She indicated that her paternal grandmother is deceased. She indicated that her paternal grandfather is deceased. She indicated that her maternal aunt is alive. She indicated that her maternal uncle is alive. She indicated that her paternal aunt is  deceased. She indicated that the status of her niece is unknown.  Social History    Social History   Socioeconomic History  . Marital status: Married    Spouse name: Not on file  . Number of children: Not on file  . Years of education: Not on file  . Highest education level: Not on file  Occupational History  . Not on file  Tobacco Use  . Smoking status: Never Smoker  . Smokeless tobacco: Never Used  Substance and Sexual Activity  . Alcohol use: Yes  . Drug use: Yes    Types: Amphetamines  . Sexual activity: Not on file  Other Topics Concern  . Not on file  Social History Narrative  . Not on file   Social Determinants of Health   Financial Resource Strain:   . Difficulty of Paying Living Expenses: Not on file  Food Insecurity:   . Worried About Charity fundraiser in the Last Year: Not on file  . Ran Out of Food in the Last Year: Not on file  Transportation Needs: No Transportation Needs  . Lack of Transportation (Medical): No  . Lack of Transportation (Non-Medical): No  Physical Activity:   . Days of Exercise per Week: Not on file  .  Minutes of Exercise per Session: Not on file  Stress:   . Feeling of Stress : Not on file  Social Connections:   . Frequency of Communication with Friends and Family: Not on file  . Frequency of Social Gatherings with Friends and Family: Not on file  . Attends Religious Services: Not on file  . Active Member of Clubs or Organizations: Not on file  . Attends Archivist Meetings: Not on file  . Marital Status: Not on file  Intimate Partner Violence:   . Fear of Current or Ex-Partner: Not on file  . Emotionally Abused: Not on file  . Physically Abused: Not on file  . Sexually Abused: Not on file     Review of Systems    General:  No chills, fever, night sweats or weight changes.  Cardiovascular:  No chest pain, dyspnea on exertion, edema, orthopnea, palpitations, paroxysmal nocturnal dyspnea. Dermatological: No rash, lesions/masses Respiratory: No cough, dyspnea Urologic: No hematuria, dysuria Abdominal:   No nausea, vomiting, diarrhea, bright red blood per rectum, melena, or hematemesis Neurologic:  No visual changes, wkns, changes in mental status. All other systems reviewed and are otherwise negative except as noted above.  Physical Exam    VS:  BP 119/82 (BP Location: Left Arm, Patient Position: Sitting, Cuff Size: Normal)   Pulse 73   Ht 5\' 2"  (1.575 m)   Wt 132 lb 12.8 oz (60.2 kg)   BMI 24.29 kg/m  , BMI Body mass index is 24.29 kg/m. GEN: Well nourished, well developed, in no acute distress. HEENT: normal. Neck: Supple, no JVD, carotid bruits, or masses. Cardiac: RRR, no murmurs, rubs, or gallops. No clubbing, cyanosis, edema.  Radials/DP/PT 2+ and equal bilaterally.  Respiratory:  Respirations regular and unlabored, clear to auscultation bilaterally. GI: Soft, nontender, nondistended, BS + x 4. MS: no deformity or atrophy. Skin: warm and dry, no rash. Neuro:  Strength and sensation are intact. Psych: Normal affect.  Accessory Clinical Findings    ECG  personally reviewed by me today-none today  EKG 05/09/2019 Normal sinus rhythm possible left atrial enlargement 62 bpm  Echocardiogram 08/15/2019 IMPRESSIONS    1. Left ventricular ejection fraction, by visual estimation, is 60 to 65%.  The left ventricle has normal function. There is no left ventricular hypertrophy.  2. The left ventricle has no regional wall motion abnormalities.  3. Global right ventricle has normal systolic function.The right ventricular size is normal. No increase in right ventricular wall thickness.  4. Left atrial size was normal.  5. Right atrial size was normal.  6. Presence of pericardial fat pad.  7. Trivial pericardial effusion is present.  8. Mild mitral annular calcification.  9. The mitral valve is normal in structure. Trivial mitral valve regurgitation. No evidence of mitral stenosis. 10. The tricuspid valve is normal in structure. 11. Aortic valve regurgitation is mild to moderate. 12. The aortic valve is tricuspid. Aortic valve regurgitation is mild to moderate. No evidence of aortic valve sclerosis or stenosis. 13. The pulmonic valve was normal in structure. Pulmonic valve regurgitation is not visualized. 14. Normal pulmonary artery systolic pressure. 15. The inferior vena cava is normal in size with greater than 50% respiratory variability, suggesting right atrial pressure of 3 mmHg.   Assessment & Plan   1.  Paroxysmal atrial fibrillation-documented history of PAF per minute Holter monitor reading done in [place] in 2014.  She is not anticoagulated for this due to her episodes been very infrequent.  Noticed increased heart rate around chemotherapy and not being able to maintain hydration. Avoid triggers caffeine, chocolate, EtOH etc. Heart healthy low-sodium diet Increase physical activity as tolerated Continue metoprolol tartrate 12.5 mg twice daily Maintain p.o. hydration  Dizziness-last episode of dizziness with last dose of chemotherapy.   Her prior episodes of dizziness coincided with her initiation of chemotherapy treatment in September 2020 and dehydration.  She describes 6 episodes without syncope.  A 30-day event monitor from 08/05/2019-09/03/2019 showed stable sinus tachycardia, sinus rhythm with PSVT, and artifact.  No atrial fibrillation or pauses were detected.  She has started radiation and will finish in 2 weeks. Continue metoprolol tartrate 12.5 twice daily-may consider DC at follow-up if no further palpitations or dizziness.  Lower extremity edema-euvolemic today.  Her repeat echocardiogram showed an LVEF of 60-65%, mild to moderate aortic regurgitation, and trivial pericardial effusion. Heart healthy low-sodium diet Elevate extremities when not active  Hyperlipidemia-LDL 79 07/02/2019 Continue simvastatin 40 mg daily Heart healthy low-sodium high-fiber diet Increase physical activity as tolerated  Disposition: Follow-up with Dr. Gwenlyn Found in 3 months.  Jossie Ng. [facility] [address] Office [phone] Fax [phone]

## 2019-09-11 NOTE — Telephone Encounter (Signed)
-----   Message from Caren Macadam, MD sent at 09/08/2019  3:35 PM EST ----- Regarding: FW: Bulk patient communication Contact patient please; not responding to GI consult ----- Message ----- From: Webb Laws D Sent: 09/03/2019   8:40 AM EST To: Caren Macadam, MD Subject: Bulk patient communication

## 2019-09-11 NOTE — Telephone Encounter (Signed)
Spoke with the patient and informed her of the message below.  Patient was given the number to call [facility] at [phone].

## 2019-09-12 ENCOUNTER — Ambulatory Visit
Admission: RE | Admit: 2019-09-12 | Discharge: 2019-09-12 | Disposition: A | Discharge status: Home / Self Care | Payer: Medicare Other | Source: Ambulatory Visit | Attending: Radiation Oncology | Admitting: Radiation Oncology

## 2019-09-12 ENCOUNTER — Other Ambulatory Visit: Payer: Self-pay

## 2019-09-12 DIAGNOSIS — C50211 Malignant neoplasm of upper-inner quadrant of right female breast: Secondary | ICD-10-CM | POA: Diagnosis not present

## 2019-09-12 NOTE — Telephone Encounter (Signed)
No entry 

## 2019-09-13 ENCOUNTER — Ambulatory Visit (INDEPENDENT_AMBULATORY_CARE_PROVIDER_SITE_OTHER): Payer: Medicare Other | Admitting: General Practice

## 2019-09-13 ENCOUNTER — Encounter: Payer: Self-pay | Admitting: General Practice

## 2019-09-13 ENCOUNTER — Ambulatory Visit
Admission: RE | Admit: 2019-09-13 | Discharge: 2019-09-13 | Disposition: A | Discharge status: Home / Self Care | Payer: Medicare Other | Source: Ambulatory Visit | Attending: Radiation Oncology | Admitting: Radiation Oncology

## 2019-09-13 ENCOUNTER — Other Ambulatory Visit: Payer: Self-pay

## 2019-09-13 VITALS — BP 119/82 | HR 73 | Ht 62.0 in | Wt 132.8 lb

## 2019-09-13 DIAGNOSIS — R600 Localized edema: Secondary | ICD-10-CM

## 2019-09-13 DIAGNOSIS — I480 Paroxysmal atrial fibrillation: Secondary | ICD-10-CM | POA: Diagnosis not present

## 2019-09-13 DIAGNOSIS — E782 Mixed hyperlipidemia: Secondary | ICD-10-CM

## 2019-09-13 DIAGNOSIS — R42 Dizziness and giddiness: Secondary | ICD-10-CM | POA: Diagnosis not present

## 2019-09-13 DIAGNOSIS — C50211 Malignant neoplasm of upper-inner quadrant of right female breast: Secondary | ICD-10-CM | POA: Diagnosis not present

## 2019-09-13 NOTE — Patient Instructions (Signed)
Medication Instructions:  The current medical regimen is effective;  continue present plan and medications as directed. Please refer to the Current Medication list given to you today. If you need a refill on your cardiac medications before your next appointment, please call your pharmacy.  Follow-Up: IN 3 months  In Person Quay Burow, MD.    Special Instructions: CONTINUE INCREASED ORAL HYDRATION  PLEASE INCREASE PHYSICAL ACTIVITY SLOWLY  Reduce your risk of getting COVID-19 With your heart disease it is especially important for people at increased risk of severe illness from COVID-19, and those who live with them, to protect themselves from getting COVID-19. The best way to protect yourself and to help reduce the spread of the virus that causes COVID-19 is to: Marland Kitchen Limit your interactions with other people as much as possible. . Take precautions to prevent getting COVID-19 when you do interact with others. If you start feeling sick and think you may have COVID-19, get in touch with your healthcare provider within 24  At [facility], you and your health needs are our priority.  As part of our continuing mission to provide you with exceptional heart care, we have created designated Provider Care Teams.  These Care Teams include your primary Cardiologist (physician) and Advanced Practice Providers (APPs -  Physician Assistants and Nurse Practitioners) who all work together to provide you with the care you need, when you need it.  Thank you for choosing [facility] at [facility]!!

## 2019-09-14 ENCOUNTER — Other Ambulatory Visit: Payer: Self-pay

## 2019-09-14 ENCOUNTER — Ambulatory Visit
Admission: RE | Admit: 2019-09-14 | Discharge: 2019-09-14 | Disposition: A | Discharge status: Home / Self Care | Payer: Medicare Other | Source: Ambulatory Visit | Attending: Radiation Oncology | Admitting: Radiation Oncology

## 2019-09-14 DIAGNOSIS — C50211 Malignant neoplasm of upper-inner quadrant of right female breast: Secondary | ICD-10-CM

## 2019-09-14 MED ORDER — SONAFINE EX EMUL: 1.0000 "application " | Freq: Once | CUTANEOUS | Status: DC

## 2019-09-16 ENCOUNTER — Ambulatory Visit: Payer: Medicare Other | Attending: Internal Medicine

## 2019-09-16 DIAGNOSIS — Z23 Encounter for immunization: Secondary | ICD-10-CM | POA: Insufficient documentation

## 2019-09-16 NOTE — Progress Notes (Signed)
   Covid-19 Vaccination Clinic  Name:  Joanne Hansen    MRN: [id number] DOB: June 04, 1946  09/16/2019  [name] was observed post Covid-19 immunization for 15 minutes without incidence. She was provided with Vaccine Information Sheet and instruction to access the V-Safe system.   [name] was instructed to call 911 with any severe reactions post vaccine: Marland Kitchen Difficulty breathing  . Swelling of your face and throat  . A fast heartbeat  . A bad rash all over your body  . Dizziness and weakness    Immunizations Administered    Name Date Dose VIS Date Route   Pfizer COVID-19 Vaccine 09/16/2019 10:57 AM 0.3 mL 08/03/2019 Intramuscular   Manufacturer: [place]   Lot: [id number]   [place]: [id number]

## 2019-09-17 ENCOUNTER — Other Ambulatory Visit: Payer: Self-pay

## 2019-09-17 ENCOUNTER — Ambulatory Visit
Admission: RE | Admit: 2019-09-17 | Discharge: 2019-09-17 | Disposition: A | Discharge status: Home / Self Care | Payer: Medicare Other | Source: Ambulatory Visit | Attending: Radiation Oncology | Admitting: Radiation Oncology

## 2019-09-17 DIAGNOSIS — C50211 Malignant neoplasm of upper-inner quadrant of right female breast: Secondary | ICD-10-CM | POA: Diagnosis not present

## 2019-09-18 ENCOUNTER — Ambulatory Visit
Admission: RE | Admit: 2019-09-18 | Discharge: 2019-09-18 | Disposition: A | Discharge status: Home / Self Care | Payer: Medicare Other | Source: Ambulatory Visit | Attending: Radiation Oncology | Admitting: Radiation Oncology

## 2019-09-18 ENCOUNTER — Ambulatory Visit: Payer: Medicare Other

## 2019-09-18 ENCOUNTER — Other Ambulatory Visit: Payer: Self-pay

## 2019-09-18 DIAGNOSIS — C50211 Malignant neoplasm of upper-inner quadrant of right female breast: Secondary | ICD-10-CM | POA: Diagnosis not present

## 2019-09-19 ENCOUNTER — Ambulatory Visit
Admission: RE | Admit: 2019-09-19 | Discharge: 2019-09-19 | Disposition: A | Discharge status: Home / Self Care | Payer: Medicare Other | Source: Ambulatory Visit | Attending: Radiation Oncology | Admitting: Radiation Oncology

## 2019-09-19 ENCOUNTER — Telehealth: Payer: Self-pay | Admitting: Gastroenterology

## 2019-09-19 ENCOUNTER — Other Ambulatory Visit: Payer: Self-pay

## 2019-09-19 ENCOUNTER — Encounter (HOSPITAL_BASED_OUTPATIENT_CLINIC_OR_DEPARTMENT_OTHER): Payer: Self-pay | Admitting: Surgery

## 2019-09-19 DIAGNOSIS — C50211 Malignant neoplasm of upper-inner quadrant of right female breast: Secondary | ICD-10-CM | POA: Diagnosis not present

## 2019-09-19 NOTE — Telephone Encounter (Addendum)
Hi Dr. Tarri Glenn, we received a referral for this patient to have a repeat colon, she previously had a colonoscopy done on 06/15/2011 with Dr. Donato Schultz she also had a EGD on 05/18/2001. I requested the records for your review and she also has additional records in Epic which state she had a cyst removal with Dr. Marcello Moores and she informed the patient that has she was doing the procedure she noticed the patient having some polyps.  Please advise on scheduling. Thank you

## 2019-09-20 ENCOUNTER — Encounter: Payer: Self-pay | Admitting: *Deleted

## 2019-09-20 ENCOUNTER — Ambulatory Visit: Payer: Medicare Other

## 2019-09-20 ENCOUNTER — Other Ambulatory Visit: Payer: Self-pay

## 2019-09-20 ENCOUNTER — Ambulatory Visit
Admission: RE | Admit: 2019-09-20 | Discharge: 2019-09-20 | Disposition: A | Discharge status: Home / Self Care | Payer: Medicare Other | Source: Ambulatory Visit | Attending: Radiation Oncology | Admitting: Radiation Oncology

## 2019-09-20 DIAGNOSIS — C50211 Malignant neoplasm of upper-inner quadrant of right female breast: Secondary | ICD-10-CM | POA: Diagnosis not present

## 2019-09-21 ENCOUNTER — Other Ambulatory Visit: Payer: Self-pay

## 2019-09-21 ENCOUNTER — Ambulatory Visit: Payer: Medicare Other

## 2019-09-21 ENCOUNTER — Ambulatory Visit
Admission: RE | Admit: 2019-09-21 | Discharge: 2019-09-21 | Disposition: A | Discharge status: Home / Self Care | Payer: Medicare Other | Source: Ambulatory Visit | Attending: Radiation Oncology | Admitting: Radiation Oncology

## 2019-09-21 ENCOUNTER — Encounter: Payer: Self-pay | Admitting: Radiation Oncology

## 2019-09-21 DIAGNOSIS — C50211 Malignant neoplasm of upper-inner quadrant of right female breast: Secondary | ICD-10-CM | POA: Diagnosis not present

## 2019-09-24 ENCOUNTER — Ambulatory Visit: Payer: Medicare Other

## 2019-09-24 ENCOUNTER — Other Ambulatory Visit: Payer: Self-pay

## 2019-09-24 ENCOUNTER — Encounter (HOSPITAL_BASED_OUTPATIENT_CLINIC_OR_DEPARTMENT_OTHER)
Admission: RE | Admit: 2019-09-24 | Discharge: 2019-09-24 | Disposition: A | Discharge status: Home / Self Care | Payer: Medicare Other | Source: Ambulatory Visit | Attending: Surgery | Admitting: Surgery

## 2019-09-24 ENCOUNTER — Other Ambulatory Visit (HOSPITAL_COMMUNITY)
Admission: RE | Admit: 2019-09-24 | Discharge: 2019-09-24 | Disposition: A | Discharge status: Home / Self Care | Payer: Medicare Other | Source: Ambulatory Visit | Attending: Surgery | Admitting: Surgery

## 2019-09-24 DIAGNOSIS — Z882 Allergy status to sulfonamides status: Secondary | ICD-10-CM | POA: Diagnosis not present

## 2019-09-24 DIAGNOSIS — Z79899 Other long term (current) drug therapy: Secondary | ICD-10-CM | POA: Diagnosis not present

## 2019-09-24 DIAGNOSIS — Z01812 Encounter for preprocedural laboratory examination: Secondary | ICD-10-CM | POA: Diagnosis present

## 2019-09-24 DIAGNOSIS — Z8249 Family history of ischemic heart disease and other diseases of the circulatory system: Secondary | ICD-10-CM | POA: Diagnosis not present

## 2019-09-24 DIAGNOSIS — I480 Paroxysmal atrial fibrillation: Secondary | ICD-10-CM | POA: Diagnosis not present

## 2019-09-24 DIAGNOSIS — Z853 Personal history of malignant neoplasm of breast: Secondary | ICD-10-CM | POA: Diagnosis not present

## 2019-09-24 DIAGNOSIS — Z20822 Contact with and (suspected) exposure to covid-19: Secondary | ICD-10-CM | POA: Diagnosis not present

## 2019-09-24 DIAGNOSIS — E785 Hyperlipidemia, unspecified: Secondary | ICD-10-CM | POA: Diagnosis not present

## 2019-09-24 DIAGNOSIS — Z803 Family history of malignant neoplasm of breast: Secondary | ICD-10-CM | POA: Diagnosis not present

## 2019-09-24 DIAGNOSIS — Z452 Encounter for adjustment and management of vascular access device: Secondary | ICD-10-CM | POA: Diagnosis present

## 2019-09-24 DIAGNOSIS — Z880 Allergy status to penicillin: Secondary | ICD-10-CM | POA: Diagnosis not present

## 2019-09-24 LAB — BASIC METABOLIC PANEL
Anion gap: 11 (ref 5–15)
BUN: 10 mg/dL (ref 8–23)
CO2: 23 mmol/L (ref 22–32)
Calcium: 9.1 mg/dL (ref 8.9–10.3)
Chloride: 109 mmol/L (ref 98–111)
Creatinine, Ser: 0.58 mg/dL (ref 0.44–1.00)
GFR calc Af Amer: >60 mL/min (ref >60)
GFR calc non Af Amer: >60 mL/min (ref >60)
Glucose, Bld: 94 mg/dL (ref 70–99)
Potassium: 5 mmol/L (ref 3.5–5.1)
Sodium: 143 mmol/L (ref 135–145)

## 2019-09-24 LAB — SARS CORONAVIRUS 2 (TAT 6-24 HRS): SARS Coronavirus 2: NEGATIVE

## 2019-09-24 NOTE — Progress Notes (Signed)

## 2019-09-24 NOTE — Progress Notes (Signed)
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   She completed chemotherapy on 07/31/2019.  She did generally well with chemo and does not have any residual peripheral neuropathy symptoms that she complains about.  Her hair however has been very slow to come back.  She was referred back to Dr. Lisbeth Renshaw for radiation therapy. She received treatment from 08/27/2019 through 09/21/2019.  She underwent chest x-ray on 08/28/2019 for cough with shortness of breath for 3 weeks. X-ray was negative for acute cardiopulmonary process.  She also underwent coronavirus testing yesterday, 09/24/2019, in anticipation of port removal on 09/27/2019.  This test was negative.   REVIEW OF SYSTEMS: Aryona received her first coronavirus shot [employer]) 09/16/2019.  Her hair is just beginning to come in.  It looks salt-and-pepper.  She has no symptoms related to her anemia and particularly no shortness of breath no unusual fatigue (beyond that due to radiation), and no palpitations beyond what she was experiencing previously, which is the reason she has been on a Holter.  She has normal activity for her age and enjoyed her birthday this past weekend.  She has an excellent diet.  A detailed review  of systems today was otherwise stable   HISTORY OF CURRENT ILLNESS: From the original intake note:  COURTENAY Hansen had routine screening mammography on 04/10/2019 showing a possible abnormality in the right breast. She underwent unilateral right diagnostic mammography with tomography and right breast ultrasonography at [facility] on 04/12/2019 showing: Breast Density Category B. Spot compression views of the medial right breast, including tomography confirm an irregular mass spanning approximately 0.9 cm. On physical exam, no mass is palpated in the upper inner quadrant of the right breast. Targeted ultrasound is performed, showing a single irregular mass versus two immediately adjacent small irregular masses, that in total measure 1.1 cm. Individually, the two irregular masses measure 0.8 x 0.4 x 0.5 cm and 0.4 x 0.4 x 0.4 cm, respectively. Mass(es) are at 1 o'clock position 3 cm from the nipple. Ultrasound of the right axilla is negative for lymphadenopathy.  Accordingly on 04/16/2019 she proceeded to biopsy of the right breast area in question. The pathology from this procedure showed ([id number]): invasive ductal carcinoma, grade II. Prognostic indicators significant for: estrogen receptor, 0% negative and progesterone receptor, 0% negative,. Proliferation marker Ki67 at 20%. HER2 negative (1+) by immunohistochemistry.  The patient's subsequent history is as detailed below.   PAST MEDICAL HISTORY: Past Medical History:  Diagnosis Date  . Arthritis   . Family history of brain cancer   . Family history of breast cancer   . Family history of colon cancer   . Fecal incontinence    05-16-2019 per pt only a little leakage but controllable (was in clinical trial for [facility] without benefit.)  . Frequent headaches   . History of syncope    pre-syncope  08/  2015  per pt no issue with this since   . Hyperlipidemia   . Malignant neoplasm of upper-inner quadrant of right breast in female,  estrogen receptor negative [facility]) oncologist-- dr Jana Hakim  dr moody   dx 08/ 2020--  invasive ductal carcinoma,  05-13-2019  s/p right breast lumpectomy   . PAF (paroxysmal atrial fibrillation) ([place]) (05-16-2019   pt from [place], recently moved to [place] to be near daughter, has not established a cardiology yet--  previously seen by dr Lowella Dandy cossu (last office note dated 02-28-2018 scanned in epic)---  echo 04-15-2014  ef 55-60%, G1DD mild AR without stenosis, RVSp 63mHg  . Rectal cyst   . Thyroid goiter    pt ultrasound in epic 06-04-2014 , nodules, no bx done  . Urinary incontinence, mixed     PAST SURGICAL HISTORY: Past Surgical History:  Procedure Laterality Date  . ABDOMINAL HYSTERECTOMY  1978   w/  RSO  and APPENDECTOMY  . BREAST LUMPECTOMY WITH RADIOACTIVE SEED AND SENTINEL LYMPH NODE BIOPSY Right 05/15/2019   Procedure: RIGHT BREAST LUMPECTOMY WITH RADIOACTIVE SEED AND SENTINEL LYMPH NODE MAPPING;  Surgeon: CErroll Luna MD;  [facility]  Service: General;  Laterality: Right;  . BREAST SURGERY  2011   biospy of right breast  . CATARACT EXTRACTION W/ INTRAOCULAR LENS  IMPLANT, BILATERAL  2004  . FLEXIBLE SIGMOIDOSCOPY N/A 05/18/2019   Procedure: FLEXIBLE SIGMOIDOSCOPY,  TRANSANAL EXCISION inclusion cyst;  Surgeon: TLeighton Ruff MD;  [facility]  Service: General;  Laterality: N/A;  . gluteus medius repair  2017  . LUMBAR LAMINECTOMY  2013   L5 -- S1  . PORTACATH PLACEMENT Right 05/15/2019   Procedure: INSERTION PORT-A-CATH WITH ULTRASOUND;  Surgeon: CErroll Luna MD;  [facility]  Service: General;  Laterality: Right;  . ROTATOR CUFF REPAIR Left 2001    FAMILY HISTORY: Family History  Problem Relation Age of Onset  . COPD Mother   . Cancer Mother 672      colon  . Arthritis Mother   . Hypertension Mother   . Miscarriages / SKoreaMother   . Stroke Mother 79 . Alzheimer's disease  Mother 848 . Heart disease Father   . Heart attack Father 255 . Lung disease Father   . Arthritis Sister   . Breast cancer Sister 752      Lumpectomy  . Diabetes Maternal Grandmother   . Breast cancer Niece 315      Louise's Daughter  . Cervical cancer Niece 441  The patient's father died at age 7490from a heart attack.  The patient's mother died at age 45426 she had a gastrointestinal cancer diagnosed age 72  The patient has 1 sister with breast cancer and one niece with breast cancer diagnosed at age 72   GYNECOLOGIC HISTORY:  No LMP recorded. Patient has had a hysterectomy. 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  Her husband GGermain Osgood(goes by "GRonald Pippins) is a retired eChief Financial Officer  Their daughter KJohnanna Schneiders(currently divorced) [place] in [place]  The patient has no grandchildren.  She is not a church attender   ADVANCED DIRECTIVES: The patient's husband is her healthcare power of attorney   HEALTH  MAINTENANCE: Social History   Tobacco Use  . Smoking status: Never Smoker  . Smokeless tobacco: Never Used  Substance Use Topics  . Alcohol use: Yes    Comment: Cocktails-3 days per week  . Drug use: Yes    Types: Amphetamines    Colonoscopy: Due 2022  PAP: Status post hysterectomy  Bone density: Pending   Allergies  Allergen Reactions  . Penicillins Swelling  . Sulfa Antibiotics Nausea And Vomiting    Current Outpatient Medications  Medication Sig Dispense Refill  . Calcium-Phosphorus-Vitamin D (CITRACAL CALCIUM GUMMIES PO) Take 2 each by mouth daily.    . Cyanocobalamin (VITAMIN B 12 PO) Take 1,000 mg by mouth daily. sublingal    . methylcellulose oral powder Take by mouth every evening.     . metoprolol tartrate (LOPRESSOR)  25 MG tablet Take 1/2 (one-half) tablet by mouth twice daily 90 tablet 0  . prochlorperazine (COMPAZINE) 10 MG tablet Take 1 tablet (10 mg total) by mouth every 6 (six) hours as needed (Nausea or vomiting). 30 tablet 1  . simvastatin (ZOCOR) 40 MG tablet Take 1 tablet by mouth once daily 90 tablet 1   Current Facility-Administered Medications  Medication Dose Route Frequency Provider Last Rate Last Admin  . ondansetron (ZOFRAN) injection 4 mg  4 mg Intravenous Once PRN Magrinat, Virgie Dad, MD         OBJECTIVE: Middle-aged white woman in no acute distress  Vitals:   09/25/19 0946  BP: 119/72  Pulse: 75  Resp: 18  Temp: 97.9 F (36.6 C)  SpO2: 100%   Wt Readings from Last 3 Encounters:  09/25/19 132 lb (59.9 kg)  09/13/19 132 lb 12.8 oz (60.2 kg)  08/21/19 129 lb 3.2 oz (58.6 kg)   Body mass index is 24.14 kg/m.    ECOG FS:1 - Symptomatic but completely ambulatory   Sclerae unicteric, EOMs intact Wearing a mask No cervical or supraclavicular adenopathy Lungs no rales or rhonchi Heart regular rate and rhythm Abd soft, nontender, positive bowel sounds MSK no focal spinal tenderness, no upper extremity lymphedema Neuro: nonfocal, well oriented, appropriate affect Breasts: The right breast is status post lumpectomy and radiation.  The cosmetic result is excellent.  There is erythema but no desquamation.  Left breast is benign.  Both axillae are benign.  LAB RESULTS:  CMP     Component Value Date/Time   NA 143 09/24/2019 1000   K 5.0 09/24/2019 1000   CL 109 09/24/2019 1000   CO2 23 09/24/2019 1000   GLUCOSE 94 09/24/2019 1000   BUN 10 09/24/2019 1000   CREATININE 0.58 09/24/2019 1000   CREATININE 0.77 05/07/2019 1547   CALCIUM 9.1 09/24/2019 1000   PROT 6.0 (L) 07/31/2019 0940   ALBUMIN 3.4 (L) 07/31/2019 0940   AST 11 (L) 07/31/2019 0940   AST 19 05/07/2019 1547   ALT 10 07/31/2019 0940   ALT 13 05/07/2019 1547   ALKPHOS 83 07/31/2019 0940   BILITOT 0.4  07/31/2019 0940   BILITOT 0.5 05/07/2019 1547   GFRNONAA >60 09/24/2019 1000   GFRNONAA >60 05/07/2019 1547   GFRAA >60 09/24/2019 1000   GFRAA >60 05/07/2019 1547    No results found for: TOTALPROTELP, ALBUMINELP, A1GS, A2GS, BETS, BETA2SER, GAMS, MSPIKE, SPEI  No results found for: KPAFRELGTCHN, LAMBDASER, KAPLAMBRATIO  Lab Results  Component Value Date   WBC 7.9 07/31/2019   NEUTROABS 6.6 07/31/2019   HGB 9.8 (L) 07/31/2019   HCT 29.9 (L) 07/31/2019   MCV  103.1 (H) 07/31/2019   PLT 288 07/31/2019    No results found for: LABCA2  No components found for: XJOITG549  No results for input(s): INR in the last 168 hours.  No results found for: LABCA2  No results found for: IYM415  No results found for: AXE940  No results found for: HWK088  No results found for: CA2729  No components found for: HGQUANT  No results found for: CEA1 / No results found for: CEA1   No results found for: AFPTUMOR  No results found for: CHROMOGRNA  No results found for: HGBA, HGBA2QUANT, HGBFQUANT, HGBSQUAN (Hemoglobinopathy evaluation)   No results found for: LDH  No results found for: IRON, TIBC, IRONPCTSAT (Iron and TIBC)  No results found for: FERRITIN  Urinalysis No results found for: COLORURINE, APPEARANCEUR, LABSPEC, PHURINE, GLUCOSEU, HGBUR, BILIRUBINUR, KETONESUR, PROTEINUR, UROBILINOGEN, NITRITE, LEUKOCYTESUR   STUDIES:  DG Chest 2 View  Result Date: 08/28/2019 CLINICAL DATA:  Pt c/o cough w/ sob x 3 wks w/ exertion, negative covid, hx breast ca, going for chemo today.ongoing cough with SOB x 3 weeks with exertion - negative Covid test EXAM: CHEST - 2 VIEW COMPARISON:  05/15/2019 FINDINGS: Port in the anterior chest wall with tip in distal SVC. Normal cardiac silhouette. No effusion, infiltrate or pneumothorax. No acute osseous abnormality. IMPRESSION: No acute cardiopulmonary process. Electronically Signed   By: Suzy Bouchard M.D.   On: 08/28/2019 13:43   Cardiac  event monitor  Result Date: 09/20/2019 1: Sinus rhythm/sinus tachycardia/sinus bradycardia 2: Short runs of PSVT    ELIGIBLE FOR AVAILABLE RESEARCH PROTOCOL: NO   ASSESSMENT: 74 y.o. [place], [place] woman status post right breast upper quadrant biopsy for an mT1b (or single T1c)  invasive ductal carcinoma, grade 2, triple negative, with an MIB-1-1 of 20%.  (1) Genetic testing on 05/07/2019 through the [facility] Breast Cancer STAT Panel + Common Hereditary cancer panel found no pathogenic mutations. The STAT Breast cancer panel offered by [facility] includes sequencing and rearrangement analysis for the following 9 genes:  ATM, BRCA1, BRCA2, CDH1, CHEK2, PALB2, PTEN, STK11 and TP53.  The Common Hereditary Cancers Panel offered by [facility] includes sequencing and/or deletion duplication testing of the following 47 genes: APC, ATM, AXIN2, BARD1, BMPR1A, BRCA1, BRCA2, BRIP1, CDH1, CDKN2A (p14ARF), CDKN2A (p16INK4a), CKD4, CHEK2, CTNNA1, DICER1, EPCAM (Deletion/duplication testing only), GREM1 (promoter region deletion/duplication testing only), KIT, MEN1, MLH1, MSH2, MSH3, MSH6, MUTYH, NBN, NF1, NHTL1, PALB2, PDGFRA, PMS2, POLD1, POLE, PTEN, RAD50, RAD51C, RAD51D, SDHB, SDHC, SDHD, SMAD4, SMARCA4. STK11, TP53, TSC1, TSC2, and VHL.  The following genes were evaluated for sequence changes only: SDHA and HOXB13 c.251G>A variant only.  (2) s/p right lumpectomy and sentinel lymph node sampling 05/15/2019 for a pT1b pN0 stage IB invasive ductal carcinoma, grade 3, with negative margins.  (a) a total of 2 sentinel lymph nodes were removed  (3) adjuvant chemotherapy consisting of cyclophosphamide and docetaxel every 21 days x 4, started 05/29/2019, completed 07/31/2019  (4) adjuvant radiation 08/27/2019 - 09/21/2019  (5) opted against prophylactic antiestrogens  (6) macrocytic anemia: moderate; further evaluation pending   PLAN: Lynnann is done with treatment for her triple negative breast cancer.  She has an  excellent prognosis and I would guess her risk of recurrence is in the 10% or less range at this point.  Her risk of developing another breast cancer, simply because she had breast cancer in the past, is higher than that.  Is more likely in the 20% range.  She can cut that in  half by taking antiestrogens.  We discussed anastrozole and tamoxifen today.  She has a good understanding of this option but she really does not want to pursue it.  She does not want to take any pill that she does not absolutely have to take and she is deterred by the potential side effects of these medications.  Accordingly we are proceeding with observation alone.  She has a polyp which was noted by Dr. Marcello Moores and she is trying to get an appointment with Dr. Tarri Glenn at [facility] to discuss further evaluation and management of that.  She had a Holter which showed some tachycardia but not atrial fibrillation.  She will be meeting with cardiology to decide whether she needs to continue on metoprolol or not.  She already has an appointment with Dr. Brantley Stage on 09/27/2019 to have her port removed and her Covid test in preparation for that was negative.  Her hair is finally beginning to come in.  There is an area in the front which is lagging a little but I think she will have a pretty full head of hair by the time I see her again in September.  Before that visit she will have her next mammogram.  She will meet with my nurse practitioner for survivorship visit in April  Elecia knows to call for any other issue that may develop before the next visit  Total encounter time 30 minutes.Sarajane Jews C. Finnlee Guarnieri, MD  09/25/19 10:32 AM Medical Oncology and Hematology [facility] [place], [place] [address] Tel. [phone]    Fax. [phone]    I, Wilburn Mylar, am acting as scribe for Dr. Virgie Dad. Orissa Arreaga.  I, Lurline Del MD, have reviewed the above documentation for accuracy and completeness,  and I agree with the above.    *Total Encounter Time as defined by the Centers for Medicare and Medicaid Services includes, in addition to the face-to-face time of a patient visit (documented in the note above) non-face-to-face time: obtaining and reviewing outside history, ordering and reviewing medications, tests or procedures, care coordination (communications with other health care professionals or caregivers) and documentation in the medical record.

## 2019-09-25 ENCOUNTER — Other Ambulatory Visit: Payer: Self-pay

## 2019-09-25 ENCOUNTER — Inpatient Hospital Stay: Payer: Medicare Other | Attending: Oncology | Admitting: Oncology

## 2019-09-25 VITALS — BP 119/72 | HR 75 | Temp 97.9°F | Resp 18 | Ht 62.0 in | Wt 132.0 lb

## 2019-09-25 DIAGNOSIS — D539 Nutritional anemia, unspecified: Secondary | ICD-10-CM

## 2019-09-25 DIAGNOSIS — C50211 Malignant neoplasm of upper-inner quadrant of right female breast: Secondary | ICD-10-CM | POA: Diagnosis present

## 2019-09-25 DIAGNOSIS — R600 Localized edema: Secondary | ICD-10-CM

## 2019-09-25 DIAGNOSIS — E782 Mixed hyperlipidemia: Secondary | ICD-10-CM

## 2019-09-25 DIAGNOSIS — R000 Tachycardia, unspecified: Secondary | ICD-10-CM | POA: Diagnosis not present

## 2019-09-25 DIAGNOSIS — N393 Stress incontinence (female) (male): Secondary | ICD-10-CM | POA: Diagnosis not present

## 2019-09-25 DIAGNOSIS — Z171 Estrogen receptor negative status [ER-]: Secondary | ICD-10-CM | POA: Diagnosis not present

## 2019-09-25 DIAGNOSIS — Z79899 Other long term (current) drug therapy: Secondary | ICD-10-CM | POA: Insufficient documentation

## 2019-09-25 DIAGNOSIS — I480 Paroxysmal atrial fibrillation: Secondary | ICD-10-CM

## 2019-09-26 ENCOUNTER — Telehealth: Payer: Self-pay | Admitting: Oncology

## 2019-09-26 NOTE — Telephone Encounter (Signed)
I left a message regarding schedule  

## 2019-09-26 NOTE — Anesthesia Preprocedure Evaluation (Addendum)
Anesthesia Evaluation  Patient identified by MRN, date of birth, ID band Patient awake    Reviewed: Allergy & Precautions, NPO status , Patient's Chart, lab work & pertinent test results, reviewed documented beta blocker date and time   Airway Mallampati: I  TM Distance: >3 FB Neck ROM: Full    Dental  (+) Teeth Intact, Dental Advisory Given   Pulmonary neg pulmonary ROS, neg recent URI,  covid - 05/12/19   breath sounds clear to auscultation       Cardiovascular negative cardio ROS  + dysrhythmias Atrial Fibrillation  Rhythm:Regular Rate:Normal     Neuro/Psych  Headaches, negative psych ROS   GI/Hepatic negative GI ROS, Neg liver ROS,   Endo/Other  negative endocrine ROS  Renal/GU negative Renal ROS  negative genitourinary   Musculoskeletal  (+) Arthritis , Osteoarthritis,    Abdominal   Peds  Hematology negative hematology ROS (+)   Anesthesia Other Findings S/p excision right breast CA 05/15/19  Reproductive/Obstetrics                            Anesthesia Physical  Anesthesia Plan  ASA: II  Anesthesia Plan: MAC   Post-op Pain Management:    Induction:   PONV Risk Score and Plan: 2 and Treatment may vary due to age or medical condition and Propofol infusion  Airway Management Planned: Nasal Cannula  Additional Equipment: None  Intra-op Plan:   Post-operative Plan:   Informed Consent: I have reviewed the patients History and Physical, chart, labs and discussed the procedure including the risks, benefits and alternatives for the proposed anesthesia with the patient or authorized representative who has indicated his/her understanding and acceptance.     Dental advisory given  Plan Discussed with: CRNA, Surgeon and Anesthesiologist  Anesthesia Plan Comments:         Anesthesia Quick Evaluation

## 2019-09-27 ENCOUNTER — Encounter (HOSPITAL_BASED_OUTPATIENT_CLINIC_OR_DEPARTMENT_OTHER): Payer: Self-pay | Admitting: Surgery

## 2019-09-27 ENCOUNTER — Other Ambulatory Visit: Payer: Self-pay

## 2019-09-27 ENCOUNTER — Ambulatory Visit (HOSPITAL_BASED_OUTPATIENT_CLINIC_OR_DEPARTMENT_OTHER)
Admission: RE | Admit: 2019-09-27 | Discharge: 2019-09-27 | Disposition: A | Discharge status: Home / Self Care | Payer: Medicare Other | Attending: Surgery | Admitting: Surgery

## 2019-09-27 ENCOUNTER — Encounter (HOSPITAL_BASED_OUTPATIENT_CLINIC_OR_DEPARTMENT_OTHER)
Admission: RE | Disposition: A | Discharge status: Home / Self Care | Payer: Self-pay | Source: Home / Self Care | Attending: Surgery

## 2019-09-27 ENCOUNTER — Ambulatory Visit (HOSPITAL_BASED_OUTPATIENT_CLINIC_OR_DEPARTMENT_OTHER): Payer: Medicare Other | Admitting: Anesthesiology

## 2019-09-27 DIAGNOSIS — Z8249 Family history of ischemic heart disease and other diseases of the circulatory system: Secondary | ICD-10-CM | POA: Insufficient documentation

## 2019-09-27 DIAGNOSIS — Z882 Allergy status to sulfonamides status: Secondary | ICD-10-CM | POA: Insufficient documentation

## 2019-09-27 DIAGNOSIS — Z452 Encounter for adjustment and management of vascular access device: Secondary | ICD-10-CM | POA: Diagnosis not present

## 2019-09-27 DIAGNOSIS — Z853 Personal history of malignant neoplasm of breast: Secondary | ICD-10-CM | POA: Diagnosis not present

## 2019-09-27 DIAGNOSIS — E785 Hyperlipidemia, unspecified: Secondary | ICD-10-CM | POA: Diagnosis not present

## 2019-09-27 DIAGNOSIS — I480 Paroxysmal atrial fibrillation: Secondary | ICD-10-CM | POA: Insufficient documentation

## 2019-09-27 DIAGNOSIS — Z803 Family history of malignant neoplasm of breast: Secondary | ICD-10-CM | POA: Insufficient documentation

## 2019-09-27 DIAGNOSIS — Z880 Allergy status to penicillin: Secondary | ICD-10-CM | POA: Insufficient documentation

## 2019-09-27 DIAGNOSIS — Z79899 Other long term (current) drug therapy: Secondary | ICD-10-CM | POA: Insufficient documentation

## 2019-09-27 HISTORY — PX: PORT-A-CATH REMOVAL: SHX5289

## 2019-09-27 SURGERY — REMOVAL PORT-A-CATH
Anesthesia: Monitor Anesthesia Care | Site: Chest | Laterality: Right

## 2019-09-27 MED ORDER — CHLORHEXIDINE GLUCONATE CLOTH 2 % EX PADS: 6.0000 | MEDICATED_PAD | Freq: Once | CUTANEOUS | Status: DC

## 2019-09-27 MED ORDER — MIDAZOLAM HCL 2 MG/2ML IJ SOLN: 1.0000 mg | INTRAMUSCULAR | Status: DC | PRN

## 2019-09-27 MED ORDER — BUPIVACAINE HCL (PF) 0.25 % IJ SOLN
INTRAMUSCULAR | Status: DC | PRN
  Administered 2019-09-27: 10 mL

## 2019-09-27 MED ORDER — ACETAMINOPHEN 160 MG/5ML PO SOLN: 325.0000 mg | ORAL | Status: DC | PRN

## 2019-09-27 MED ORDER — FENTANYL CITRATE (PF) 100 MCG/2ML IJ SOLN
INTRAMUSCULAR | Status: AC
  Filled 2019-09-27: qty 2

## 2019-09-27 MED ORDER — FENTANYL CITRATE (PF) 100 MCG/2ML IJ SOLN: 50.0000 ug | INTRAMUSCULAR | Status: DC | PRN

## 2019-09-27 MED ORDER — ONDANSETRON HCL 4 MG/2ML IJ SOLN
INTRAMUSCULAR | Status: DC | PRN
  Administered 2019-09-27: 4 mg via INTRAVENOUS

## 2019-09-27 MED ORDER — CLINDAMYCIN PHOSPHATE 900 MG/50ML IV SOLN
INTRAVENOUS | Status: AC
  Filled 2019-09-27: qty 50

## 2019-09-27 MED ORDER — CLINDAMYCIN PHOSPHATE 900 MG/50ML IV SOLN
900.0000 mg | INTRAVENOUS | Status: AC
  Administered 2019-09-27: 900 mg via INTRAVENOUS

## 2019-09-27 MED ORDER — PROPOFOL 500 MG/50ML IV EMUL
INTRAVENOUS | Status: DC | PRN
  Administered 2019-09-27: 25 ug/kg/min via INTRAVENOUS

## 2019-09-27 MED ORDER — FENTANYL CITRATE (PF) 100 MCG/2ML IJ SOLN
INTRAMUSCULAR | Status: DC | PRN
  Administered 2019-09-27 (×2): 50 ug via INTRAVENOUS

## 2019-09-27 MED ORDER — ACETAMINOPHEN 325 MG PO TABS: 325.0000 mg | ORAL_TABLET | ORAL | Status: DC | PRN

## 2019-09-27 MED ORDER — OXYCODONE HCL 5 MG/5ML PO SOLN: 5.0000 mg | Freq: Once | ORAL | Status: DC | PRN

## 2019-09-27 MED ORDER — FENTANYL CITRATE (PF) 100 MCG/2ML IJ SOLN: 25.0000 ug | INTRAMUSCULAR | Status: DC | PRN

## 2019-09-27 MED ORDER — MEPERIDINE HCL 25 MG/ML IJ SOLN: 6.2500 mg | INTRAMUSCULAR | Status: DC | PRN

## 2019-09-27 MED ORDER — OXYCODONE HCL 5 MG PO TABS: 5.0000 mg | ORAL_TABLET | Freq: Once | ORAL | Status: DC | PRN

## 2019-09-27 MED ORDER — ONDANSETRON HCL 4 MG/2ML IJ SOLN: 4.0000 mg | Freq: Once | INTRAMUSCULAR | Status: DC | PRN

## 2019-09-27 MED ORDER — ONDANSETRON HCL 4 MG/2ML IJ SOLN
INTRAMUSCULAR | Status: AC
  Filled 2019-09-27: qty 2

## 2019-09-27 MED ORDER — LACTATED RINGERS IV SOLN: INTRAVENOUS | Status: DC

## 2019-09-27 MED ORDER — PROPOFOL 10 MG/ML IV BOLUS
INTRAVENOUS | Status: AC
  Filled 2019-09-27: qty 20

## 2019-09-27 SURGICAL SUPPLY — 36 items
BENZOIN TINCTURE PRP APPL 2/3 (GAUZE/BANDAGES/DRESSINGS) IMPLANT
BLADE SURG 15 STRL LF DISP TIS (BLADE) ×1 IMPLANT
BLADE SURG 15 STRL SS (BLADE) ×2
CHLORAPREP W/TINT 26 (MISCELLANEOUS) ×3 IMPLANT
CLOSURE WOUND 1/2 X4 (GAUZE/BANDAGES/DRESSINGS)
COVER BACK TABLE 60X90IN (DRAPES) ×3 IMPLANT
COVER MAYO STAND STRL (DRAPES) ×3 IMPLANT
COVER WAND RF STERILE (DRAPES) IMPLANT
DECANTER SPIKE VIAL GLASS SM (MISCELLANEOUS) ×3 IMPLANT
DERMABOND ADVANCED (GAUZE/BANDAGES/DRESSINGS) ×2
DERMABOND ADVANCED .7 DNX12 (GAUZE/BANDAGES/DRESSINGS) ×1 IMPLANT
DRAPE LAPAROTOMY 100X72 PEDS (DRAPES) ×3 IMPLANT
DRAPE UTILITY XL STRL (DRAPES) ×3 IMPLANT
ELECT REM PT RETURN 9FT ADLT (ELECTROSURGICAL) ×3
ELECTRODE REM PT RTRN 9FT ADLT (ELECTROSURGICAL) ×1 IMPLANT
GLOVE BIO SURGEON STRL SZ7 (GLOVE) ×3 IMPLANT
GLOVE BIOGEL PI IND STRL 7.0 (GLOVE) ×1 IMPLANT
GLOVE BIOGEL PI IND STRL 7.5 (GLOVE) ×1 IMPLANT
GLOVE BIOGEL PI IND STRL 8 (GLOVE) ×1 IMPLANT
GLOVE BIOGEL PI INDICATOR 7.0 (GLOVE) ×2
GLOVE BIOGEL PI INDICATOR 7.5 (GLOVE) ×2
GLOVE BIOGEL PI INDICATOR 8 (GLOVE) ×2
GLOVE ECLIPSE 8.0 STRL XLNG CF (GLOVE) ×3 IMPLANT
GOWN STRL REUS W/ TWL LRG LVL3 (GOWN DISPOSABLE) ×2 IMPLANT
GOWN STRL REUS W/TWL LRG LVL3 (GOWN DISPOSABLE) ×4
NEEDLE HYPO 25X1 1.5 SAFETY (NEEDLE) ×3 IMPLANT
NS IRRIG 1000ML POUR BTL (IV SOLUTION) ×3 IMPLANT
PACK BASIN DAY SURGERY FS (CUSTOM PROCEDURE TRAY) ×3 IMPLANT
PENCIL SMOKE EVACUATOR (MISCELLANEOUS) IMPLANT
SLEEVE SCD COMPRESS KNEE MED (MISCELLANEOUS) ×3 IMPLANT
SPONGE LAP 4X18 RFD (DISPOSABLE) ×3 IMPLANT
STRIP CLOSURE SKIN 1/2X4 (GAUZE/BANDAGES/DRESSINGS) IMPLANT
SUT MON AB 4-0 PC3 18 (SUTURE) ×3 IMPLANT
SUT VICRYL 3-0 CR8 SH (SUTURE) ×3 IMPLANT
SYR CONTROL 10ML LL (SYRINGE) ×3 IMPLANT
TOWEL GREEN STERILE FF (TOWEL DISPOSABLE) ×3 IMPLANT

## 2019-09-27 NOTE — Anesthesia Procedure Notes (Signed)
Procedure Name: MAC Date/Time: 09/27/2019 8:09 AM Performed by: Marrianne Mood, CRNA Pre-anesthesia Checklist: Patient identified, Emergency Drugs available, Suction available, Patient being monitored and Timeout performed Patient Re-evaluated:Patient Re-evaluated prior to induction Oxygen Delivery Method: Simple face mask Preoxygenation: Pre-oxygenation with 100% oxygen

## 2019-09-27 NOTE — Interval H&P Note (Signed)
History and Physical Interval Note:  09/27/2019 7:55 AM  Joanne Hansen  has presented today for surgery, with the diagnosis of PORT IN PLACE.  The various methods of treatment have been discussed with the patient and family. After consideration of risks, benefits and other options for treatment, the patient has consented to  Procedure(s): PORT REMOVAL (N/A) as a surgical intervention.  The patient's history has been reviewed, patient examined, no change in status, stable for surgery.  I have reviewed the patient's chart and labs.  Questions were answered to the patient's satisfaction.     [place]

## 2019-09-27 NOTE — Transfer of Care (Signed)
Immediate Anesthesia Transfer of Care Note  Patient: Joanne Hansen  Procedure(s) Performed: PORT REMOVAL (Right Chest)  Patient Location: PACU  Anesthesia Type:MAC  Level of Consciousness: awake and patient cooperative  Airway & Oxygen Therapy: Patient Spontanous Breathing and Patient connected to face mask oxygen  Post-op Assessment: Report given to RN and Post -op Vital signs reviewed and stable  Post vital signs: Reviewed and stable  Last Vitals:  Vitals Value Taken Time  BP    Temp    Pulse 75 09/27/19 0834  Resp 25 09/27/19 0834  SpO2 100 % 09/27/19 0834  Vitals shown include unvalidated device data.  Last Pain:  Vitals:   09/27/19 0742  TempSrc: Oral  PainSc: 0-No pain      Patients Stated Pain Goal: 3 (86/38/17 7116)  Complications: No apparent anesthesia complications

## 2019-09-27 NOTE — Discharge Instructions (Signed)
GENERAL SURGERY: POST OP INSTRUCTIONS  ######################################################################  EAT Gradually transition to a high fiber diet with a fiber supplement over the next few weeks after discharge.  Start with a pureed / full liquid diet (see below)  WALK Walk an hour a day.  Control your pain to do that.    CONTROL PAIN Control pain so that you can walk, sleep, tolerate sneezing/coughing, go up/down stairs.  HAVE A BOWEL MOVEMENT DAILY Keep your bowels regular to avoid problems.  OK to try a laxative to override constipation.  OK to use an antidairrheal to slow down diarrhea.  Call if not better after 2 tries  CALL IF YOU HAVE PROBLEMS/CONCERNS Call if you are still struggling despite following these instructions. Call if you have concerns not answered by these instructions  ######################################################################    1. DIET: Follow a light bland diet & liquids the first 24 hours after arrival home, such as soup, liquids, starches, etc.  Be sure to drink plenty of fluids.  Quickly advance to a usual solid diet within a few days.  Avoid fast food or heavy meals as your are more likely to get nauseated or have irregular bowels.  A low-fat, high-fiber diet for the rest of your life is ideal.   2. Take your usually prescribed home medications unless otherwise directed. 3. PAIN CONTROL: a. Pain is best controlled by a usual combination of three different methods TOGETHER: i. Ice/Heat ii. Over the counter pain medication iii. Prescription pain medication b. Most patients will experience some swelling and bruising around the incisions.  Ice packs or heating pads (30-60 minutes up to 6 times a day) will help. Use ice for the first few days to help decrease swelling and bruising, then switch to heat to help relax tight/sore spots and speed recovery.  Some people prefer to use ice alone, heat alone, alternating between ice & heat.   Experiment to what works for you.  Swelling and bruising can take several weeks to resolve.   c. It is helpful to take an over-the-counter pain medication regularly for the first few weeks.  Choose one of the following that works best for you: i. Naproxen (Aleve, etc)  Two 220mg tabs twice a day ii. Ibuprofen (Advil, etc) Three 200mg tabs four times a day (every meal & bedtime) iii. Acetaminophen (Tylenol, etc) 500-650mg four times a day (every meal & bedtime) d. A  prescription for pain medication (such as oxycodone, hydrocodone, etc) should be given to you upon discharge.  Take your pain medication as prescribed.  i. If you are having problems/concerns with the prescription medicine (does not control pain, nausea, vomiting, rash, itching, etc), please call us [phone] to see if we need to switch you to a different pain medicine that will work better for you and/or control your side effect better. ii. If you need a refill on your pain medication, please contact your pharmacy.  They will contact our office to request authorization. Prescriptions will not be filled after 5 pm or on week-ends. 4. Avoid getting constipated.  Between the surgery and the pain medications, it is common to experience some constipation.  Increasing fluid intake and taking a fiber supplement (such as Metamucil, Citrucel, FiberCon, MiraLax, etc) 1-2 times a day regularly will usually help prevent this problem from occurring.  A mild laxative (prune juice, Milk of Magnesia, MiraLax, etc) should be taken according to package directions if there are no bowel movements after 48 hours.   5. Wash /   shower every day.  You may shower over the dressings as they are waterproof.  Continue to shower over incision(s) after the dressing is off. 6. Remove your waterproof bandages 5 days after surgery.  You may leave the incision open to air.  You may have skin tapes (Steri Strips) covering the incision(s).  Leave them on until one week, then  remove.  You may replace a dressing/Band-Aid to cover the incision for comfort if you wish.      7. ACTIVITIES as tolerated:   a. You may resume regular (light) daily activities beginning the next day--such as daily self-care, walking, climbing stairs--gradually increasing activities as tolerated.  If you can walk 30 minutes without difficulty, it is safe to try more intense activity such as jogging, treadmill, bicycling, low-impact aerobics, swimming, etc. b. Save the most intensive and strenuous activity for last such as sit-ups, heavy lifting, contact sports, etc  Refrain from any heavy lifting or straining until you are off narcotics for pain control.   c. DO NOT PUSH THROUGH PAIN.  Let pain be your guide: If it hurts to do something, don't do it.  Pain is your body warning you to avoid that activity for another week until the pain goes down. d. You may drive when you are no longer taking prescription pain medication, you can comfortably wear a seatbelt, and you can safely maneuver your car and apply brakes. e. You may have sexual intercourse when it is comfortable.  8. FOLLOW UP in our office a. Please call [facility] at [phone] to set up an appointment to see your surgeon in the office for a follow-up appointment approximately 2-3 weeks after your surgery. b. Make sure that you call for this appointment the day you arrive home to insure a convenient appointment time. 9. IF YOU HAVE DISABILITY OR FAMILY LEAVE FORMS, BRING THEM TO THE OFFICE FOR PROCESSING.  DO NOT GIVE THEM TO YOUR DOCTOR.   WHEN TO CALL US [phone]: 1. Poor pain control 2. Reactions / problems with new medications (rash/itching, nausea, etc)  3. Fever over 101.5 F (38.5 C) 4. Worsening swelling or bruising 5. Continued bleeding from incision. 6. Increased pain, redness, or drainage from the incision 7. Difficulty breathing / swallowing   The clinic staff is available to answer your questions during regular  business hours (8:30am-5pm).  Please don't hesitate to call and ask to speak to one of our nurses for clinical concerns.   If you have a medical emergency, go to the nearest emergency room or call 911.  A surgeon from [facility] is always on call at the hospitals   [facility] [address], [address], [place],   [address] ? MAIN: [phone] ? TOLL FREE: [phone] ?  FAX [phone] [email or web address]    Post Anesthesia Home Care Instructions  Activity: Get plenty of rest for the remainder of the day. A responsible individual must stay with you for 24 hours following the procedure.  For the next 24 hours, DO NOT: -Drive a car -Operate machinery -Drink alcoholic beverages -Take any medication unless instructed by your physician -Make any legal decisions or sign important papers.  Meals: Start with liquid foods such as gelatin or soup. Progress to regular foods as tolerated. Avoid greasy, spicy, heavy foods. If nausea and/or vomiting occur, drink only clear liquids until the nausea and/or vomiting subsides. Call your physician if vomiting continues.  Special Instructions/Symptoms: Your throat may feel dry or   sore from the anesthesia or the breathing tube placed in your throat during surgery. If this causes discomfort, gargle with warm salt water. The discomfort should disappear within 24 hours.  If you had a scopolamine patch placed behind your ear for the management of post- operative nausea and/or vomiting:  1. The medication in the patch is effective for 72 hours, after which it should be removed.  Wrap patch in a tissue and discard in the trash. Wash hands thoroughly with soap and water. 2. You may remove the patch earlier than 72 hours if you experience unpleasant side effects which may include dry mouth, dizziness or visual disturbances. 3. Avoid touching the patch. Wash your hands with soap and water after  contact with the patch.     

## 2019-09-27 NOTE — Op Note (Signed)

## 2019-09-27 NOTE — Anesthesia Postprocedure Evaluation (Signed)
Anesthesia Post Note  Patient: NADINA FOMBY  Procedure(s) Performed: PORT REMOVAL (Right Chest)     Patient location during evaluation: PACU Anesthesia Type: MAC Level of consciousness: awake and alert Pain management: pain level controlled Vital Signs Assessment: post-procedure vital signs reviewed and stable Respiratory status: spontaneous breathing, nonlabored ventilation, respiratory function stable and patient connected to nasal cannula oxygen Cardiovascular status: stable and blood pressure returned to baseline Postop Assessment: no apparent nausea or vomiting Anesthetic complications: no    Last Vitals:  Vitals:   09/27/19 0845 09/27/19 0915  BP: 111/70 109/63  Pulse: 70 68  Resp: 12 13  Temp:  37 C  SpO2: 100% 100%    Last Pain:  Vitals:   09/27/19 0915  TempSrc: Oral  PainSc: 0-No pain                 Keva Darty

## 2019-09-27 NOTE — H&P (Signed)
Joanne Hansen is an 74 y.o. female.   Chief Complaint: port in place  HPI: pt here for port removal    Past Medical History:  Diagnosis Date  . Arthritis   . Family history of brain cancer   . Family history of breast cancer   . Family history of colon cancer   . Fecal incontinence    05-16-2019 per pt only a little leakage but controllable (was in clinical trial for [facility] without benefit.)  . Frequent headaches   . History of syncope    pre-syncope  08/ 2015  per pt no issue with this since   . Hyperlipidemia   . 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 PAF (paroxysmal atrial fibrillation) ([place]) (05-16-2019   pt from [place], recently moved to [place] to be near daughter, has not established a cardiology yet--  previously seen by dr Lowella Dandy cossu (last office note dated 02-28-2018 scanned in epic)---  echo 04-15-2014  ef 55-60%, G1DD mild AR without stenosis, RVSp 20mmHg  . Rectal cyst   . Thyroid goiter    pt ultrasound in epic 06-04-2014 , nodules, no bx done  . Urinary incontinence, mixed     Past Surgical History:  Procedure Laterality Date  . ABDOMINAL HYSTERECTOMY  1978   w/  RSO  and APPENDECTOMY  . BREAST LUMPECTOMY WITH RADIOACTIVE SEED AND SENTINEL LYMPH NODE BIOPSY Right 05/15/2019   Procedure: RIGHT BREAST LUMPECTOMY WITH RADIOACTIVE SEED AND SENTINEL LYMPH NODE MAPPING;  Surgeon: Erroll Luna, MD;  Location: [place];  Service: General;  Laterality: Right;  . BREAST SURGERY  2011   biospy of right breast  . CATARACT EXTRACTION W/ INTRAOCULAR LENS  IMPLANT, BILATERAL  2004  . FLEXIBLE SIGMOIDOSCOPY N/A 05/18/2019   Procedure: FLEXIBLE SIGMOIDOSCOPY,  TRANSANAL EXCISION inclusion cyst;  Surgeon: Leighton Ruff, MD;  Location: [facility];  Service:  General;  Laterality: N/A;  . gluteus medius repair  2017  . LUMBAR LAMINECTOMY  2013   L5 -- S1  . PORTACATH PLACEMENT Right 05/15/2019   Procedure: INSERTION PORT-A-CATH WITH ULTRASOUND;  Surgeon: Erroll Luna, MD;  Location: [place];  Service: General;  Laterality: Right;  . ROTATOR CUFF REPAIR Left 2001    Family History  Problem Relation Age of Onset  . COPD Mother   . Cancer Mother 12       colon  . Arthritis Mother   . Hypertension Mother   . Miscarriages / [place] Mother   . Stroke Mother 59  . Alzheimer's disease Mother 54  . Heart disease Father   . Heart attack Father 60  . Lung disease Father   . Arthritis Sister   . Breast cancer Sister 19       Lumpectomy  . Diabetes Maternal Grandmother   . Breast cancer Niece 30       Louise's Daughter  . Cervical cancer Niece 66   Social History:  reports that she has never smoked. She has never used smokeless tobacco. She reports current alcohol use. She reports current drug use. Drug: Amphetamines.  Allergies:  Allergies  Allergen Reactions  . Penicillins Swelling  . Sulfa Antibiotics Nausea And Vomiting    Facility-Administered Medications Prior to Admission  Medication Dose Route Frequency Provider Last Rate Last Admin  .  ondansetron (ZOFRAN) injection 4 mg  4 mg Intravenous Once PRN Magrinat, Virgie Dad, MD       Medications Prior to Admission  Medication Sig Dispense Refill  . Calcium-Phosphorus-Vitamin D (CITRACAL CALCIUM GUMMIES PO) Take 2 each by mouth daily.    . Cyanocobalamin (VITAMIN B 12 PO) Take 1,000 mg by mouth daily. sublingal    . methylcellulose oral powder Take by mouth every evening.     . metoprolol tartrate (LOPRESSOR) 25 MG tablet Take 1/2 (one-half) tablet by mouth twice daily 90 tablet 0  . simvastatin (ZOCOR) 40 MG tablet Take 1 tablet by mouth once daily 90 tablet 1  . prochlorperazine (COMPAZINE) 10 MG tablet Take 1 tablet (10 mg total) by mouth every 6 (six) hours  as needed (Nausea or vomiting). 30 tablet 1    No results found for this or any previous visit (from the past 48 hour(s)). No results found.  Review of Systems  All other systems reviewed and are negative.   Blood pressure 112/64, pulse 82, temperature (!) 97.1 F (36.2 C), temperature source Oral, resp. rate 20, height 5\' 2"  (1.575 m), weight 60 kg, SpO2 99 %. Physical Exam  Constitutional: She is oriented to person, place, and time. She appears well-developed.  HENT:  Head: Normocephalic.  Eyes: Pupils are equal, round, and reactive to light.  Cardiovascular: Normal rate.  Respiratory: Effort normal.  Right sided port   GI: Soft. There is no abdominal tenderness.  Musculoskeletal:        General: Normal range of motion.     Cervical back: Normal range of motion.  Neurological: She is alert and oriented to person, place, and time.     Assessment/Plan    removal The procedure has been discussed with the patient.  Alternative therapies have been discussed with the patient.  Operative risks include bleeding,  Infection,  Organ injury,  Nerve injury,  Blood vessel injury,  DVT,  Pulmonary embolism,  Death,  And possible reoperation.  Medical management risks include worsening of present situation.  The success of the procedure is 50 -90 % at treating patients symptoms.  The patient understands and agrees to proceed.  Turner Daniels, MD 09/27/2019, 7:53 AM

## 2019-10-02 ENCOUNTER — Encounter: Payer: Self-pay | Admitting: Gastroenterology

## 2019-10-02 ENCOUNTER — Encounter: Payer: Self-pay | Admitting: *Deleted

## 2019-10-05 ENCOUNTER — Telehealth: Payer: Self-pay | Admitting: *Deleted

## 2019-10-05 ENCOUNTER — Encounter: Payer: Self-pay | Admitting: Family Medicine

## 2019-10-05 ENCOUNTER — Other Ambulatory Visit: Payer: Self-pay

## 2019-10-05 ENCOUNTER — Telehealth (INDEPENDENT_AMBULATORY_CARE_PROVIDER_SITE_OTHER): Payer: Medicare Other | Admitting: Family Medicine

## 2019-10-05 VITALS — Temp 97.0°F

## 2019-10-05 DIAGNOSIS — B0230 Zoster ocular disease, unspecified: Secondary | ICD-10-CM

## 2019-10-05 MED ORDER — ACYCLOVIR 5 % EX OINT: 1.0000 "application " | TOPICAL_OINTMENT | CUTANEOUS | 1 refills | Status: AC

## 2019-10-05 MED ORDER — LIDOCAINE 5 % EX OINT: TOPICAL_OINTMENT | CUTANEOUS | 2 refills | Status: DC

## 2019-10-05 MED ORDER — VALACYCLOVIR HCL 1 G PO TABS: 1000.0000 mg | ORAL_TABLET | Freq: Three times a day (TID) | ORAL | 0 refills | Status: AC

## 2019-10-05 MED ORDER — GABAPENTIN 100 MG PO CAPS: 100.0000 mg | ORAL_CAPSULE | Freq: Three times a day (TID) | ORAL | 1 refills | Status: DC | PRN

## 2019-10-05 NOTE — Telephone Encounter (Signed)
Spoke with the pt and informed her of the message below.   

## 2019-10-05 NOTE — Telephone Encounter (Signed)
-----   Message from Caren Macadam, MD sent at 10/05/2019  4:25 PM EST ----- Please let her know [facility] couldn't compound so I sent topical pain cream to [facility] on [address]: [phone]. If she wants other rx transferred there we can do that.

## 2019-10-05 NOTE — Progress Notes (Signed)
Virtual Visit via Video Note  I connected with Joanne Hansen on 10/05/19 at  3:30 PM EST by a video enabled telemedicine application and verified that I am speaking with the correct person using two identifiers.  Location patient: home Location provider:work or home office Persons participating in the virtual visit: patient, provider  I discussed the limitations of evaluation and management by telemedicine and the availability of in person appointments. The patient expressed understanding and agreed to proceed.   Joanne Hansen DOB: Jul 25, 1946 Encounter date: 10/05/2019  This is a 74 y.o. female who presents with Chief Complaint  Patient presents with  . Facial Pain    patient complains of a "dull" sensation along the enitire left side of her face and mandible-blisters present x2 days ago, tender to the touch and worse now, "prickly sensation"    History of present illness: Terrible pain face - now getting blistery rash starting. Also some redness in corner of eye, which is where it started. Felt like pin in eye and progressed. Took 2 tylenol this morning and it didn't do anything. Inside of nose is very irritated. Hurts all the time, but touching is extremely sensitive. Every once in awhile gets quick pain side of head.   No changes in vision.   Not had shingles before, but had zostavax in 2012. Pain is prickly but much worse with touch.    Allergies  Allergen Reactions  . Penicillins Swelling  . Sulfa Antibiotics Nausea And Vomiting   Current Meds  Medication Sig  . Calcium-Phosphorus-Vitamin D (CITRACAL CALCIUM GUMMIES PO) Take 2 each by mouth daily.  . Cyanocobalamin (VITAMIN B 12 PO) Take 1,000 mg by mouth daily. sublingal  . methylcellulose oral powder Take by mouth every evening.   . metoprolol tartrate (LOPRESSOR) 25 MG tablet Take 1/2 (one-half) tablet by mouth twice daily  . simvastatin (ZOCOR) 40 MG tablet Take 1 tablet by mouth once daily   Current  Facility-Administered Medications for the 10/05/19 encounter (Telemedicine) with Caren Macadam, MD  Medication  . ondansetron (ZOFRAN) injection 4 mg    Review of Systems  Constitutional: Negative for chills, fatigue and fever.  HENT: Negative for congestion, postnasal drip, rhinorrhea, sinus pressure, sinus pain and sore throat.   Respiratory: Negative for cough, chest tightness, shortness of breath and wheezing.   Cardiovascular: Negative for chest pain, palpitations and leg swelling.  Skin: Positive for rash.    Objective:  Temp (!) 97 F (36.1 C)       BP Readings from Last 3 Encounters:  09/27/19 109/63  09/25/19 119/72  09/13/19 119/82   Wt Readings from Last 3 Encounters:  09/27/19 132 lb 4.4 oz (60 kg)  09/25/19 132 lb (59.9 kg)  09/13/19 132 lb 12.8 oz (60.2 kg)    EXAM:  GENERAL: alert, oriented, appears well and in no acute distress  HEENT: atraumatic, conjunctiva clear, no obvious abnormalities on inspection of external nose and ears.  There is erythema of her left cheek.  It is difficult via camera to see currently blisters that she is able to see, but I do appreciate a papular appearance of the skin in this area.  NECK: normal movements of the head and neck  LUNGS: on inspection no signs of respiratory distress, breathing rate appears normal, no obvious gross SOB, gasping or wheezing  CV: no obvious cyanosis  MS: moves all visible extremities without noticeable abnormality  PSYCH/NEURO: pleasant and cooperative, no obvious depression or anxiety, speech and thought  processing grossly intact    Assessment/Plan  1. Herpes zoster with ophthalmic complication, unspecified herpes zoster eye disease Given her current symptoms, I feel that shingles is the most likely diagnosis.  We will treat with Valtrex.  I have encouraged her to call her eye doctor soon as we get off the phone to make sure they are aware since she does have some irritation of the by the  eye.  She was instructed to follow-up with a physician if she has any worsening of involvement around the eye area this weekend.  Topical acyclovir to help with blisters.  Also sent in compounding cream to help with pain.  Gabapentin if needed.  She prefers to limit medication use when able, but will take antiviral.  Reviewed proper hygiene to limit spread to others. - valACYclovir (VALTREX) 1000 MG tablet; Take 1 tablet (1,000 mg total) by mouth 3 (three) times daily for 7 days.  Dispense: 21 tablet; Refill: 0 - gabapentin (NEURONTIN) 100 MG capsule; Take 1-2 capsules (100-200 mg total) by mouth 3 (three) times daily as needed.  Dispense: 90 capsule; Refill: 1 - acyclovir ointment (ZOVIRAX) 5 %; Apply 1 application topically every 3 (three) hours for 7 days.  Dispense: 5 g; Refill: 1   Return if symptoms worsen or fail to improve. But we will check in next week  I discussed the assessment and treatment plan with the patient. The patient was provided an opportunity to ask questions and all were answered. The patient agreed with the plan and demonstrated an understanding of the instructions.   The patient was advised to call back or seek an in-person evaluation if the symptoms worsen or if the condition fails to improve as anticipated.  I provided 30 minutes of non-face-to-face time during this encounter.   Micheline Rough, MD

## 2019-10-08 ENCOUNTER — Other Ambulatory Visit: Payer: Self-pay

## 2019-10-08 ENCOUNTER — Telehealth: Payer: Self-pay | Admitting: Family Medicine

## 2019-10-08 ENCOUNTER — Encounter (HOSPITAL_COMMUNITY): Payer: Self-pay

## 2019-10-08 ENCOUNTER — Emergency Department (HOSPITAL_COMMUNITY)
Admission: EM | Admit: 2019-10-08 | Discharge: 2019-10-08 | Disposition: A | Discharge status: Home / Self Care | Payer: Medicare Other | Attending: Emergency Medicine | Admitting: Emergency Medicine

## 2019-10-08 ENCOUNTER — Ambulatory Visit: Payer: Medicare Other

## 2019-10-08 ENCOUNTER — Telehealth (INDEPENDENT_AMBULATORY_CARE_PROVIDER_SITE_OTHER): Payer: Medicare Other | Admitting: Family Medicine

## 2019-10-08 ENCOUNTER — Telehealth: Payer: Self-pay | Admitting: *Deleted

## 2019-10-08 DIAGNOSIS — B029 Zoster without complications: Secondary | ICD-10-CM | POA: Insufficient documentation

## 2019-10-08 DIAGNOSIS — Z79899 Other long term (current) drug therapy: Secondary | ICD-10-CM | POA: Insufficient documentation

## 2019-10-08 DIAGNOSIS — B0230 Zoster ocular disease, unspecified: Secondary | ICD-10-CM

## 2019-10-08 MED ORDER — PREDNISONE 20 MG PO TABS: ORAL_TABLET | ORAL | 0 refills | Status: DC

## 2019-10-08 MED ORDER — FLUORESCEIN SODIUM 1 MG OP STRP
1.0000 | ORAL_STRIP | Freq: Once | OPHTHALMIC | Status: AC
  Administered 2019-10-08: 1 via OPHTHALMIC
  Filled 2019-10-08: qty 1

## 2019-10-08 MED ORDER — PREDNISONE 20 MG PO TABS
60.0000 mg | ORAL_TABLET | Freq: Once | ORAL | Status: AC
  Administered 2019-10-08: 60 mg via ORAL
  Filled 2019-10-08: qty 3

## 2019-10-08 NOTE — ED Provider Notes (Signed)
[facility] DEPT Provider Note   CSN: [id number] Arrival date & time: 10/08/19  1144     History Chief Complaint  Patient presents with  . Herpes Zoster    Joanne Hansen is a 74 y.o. female.  Patient c/o shingles to left side of face. Symptoms acute onset 5 days ago, moderate, persistent, pain dull/burning. States is on day 5 valtrex - states pcp/televisit today instructed patient to go to ED to get checked. Patient denies headache. No neck pain or stiffness. No eye fb sensation/pain, or change in vision. No fever or chills. No other skin lesions or rash. No hx shingles.   The history is provided by the patient.       Past Medical History:  Diagnosis Date  . Arthritis   . Family history of brain cancer   . Family history of breast cancer   . Family history of colon cancer   . Fecal incontinence    05-16-2019 per pt only a little leakage but controllable (was in clinical trial for [facility] without benefit.)  . Frequent headaches   . History of syncope    pre-syncope  08/ 2015  per pt no issue with this since   . Hyperlipidemia   . Malignant neoplasm of upper-inner quadrant of right breast in female, estrogen receptor negative [facility]) oncologist-- dr Jana Hakim  dr moody   dx 08/ 2020--  invasive ductal carcinoma,  05-13-2019  s/p right breast lumpectomy   . PAF (paroxysmal atrial fibrillation) ([place]) (05-16-2019   pt from [place], recently moved to [place] to be near daughter, has not established a cardiology yet--  previously seen by dr Lowella Dandy cossu (last office note dated 02-28-2018 scanned in epic)---  echo 04-15-2014  ef 55-60%, G1DD mild AR without stenosis, RVSp 78mmHg  . Rectal cyst   . Thyroid goiter    pt ultrasound in epic 06-04-2014 , nodules, no bx done  . Urinary incontinence, mixed     Patient Active Problem List   Diagnosis Date Noted  . Anemia, macrocytic 09/25/2019  . Paroxysmal atrial fibrillation ([place]) 08/02/2019  . Dizziness  08/02/2019  . Lower extremity edema 08/02/2019  . Genetic testing 05/16/2019  . Malignant neoplasm of upper-inner quadrant of right breast in female, estrogen receptor negative ([place]) 05/07/2019  . Family history of breast cancer   . Family history of colon cancer   . Family history of brain cancer   . Hyperlipidemia 11/08/2018  . Fecal incontinence 11/08/2018  . Urinary, incontinence, stress female 11/08/2018    Past Surgical History:  Procedure Laterality Date  . ABDOMINAL HYSTERECTOMY  1978   w/  RSO  and APPENDECTOMY  . BREAST LUMPECTOMY WITH RADIOACTIVE SEED AND SENTINEL LYMPH NODE BIOPSY Right 05/15/2019   Procedure: RIGHT BREAST LUMPECTOMY WITH RADIOACTIVE SEED AND SENTINEL LYMPH NODE MAPPING;  Surgeon: Erroll Luna, MD;  Location: [facility];  Service: General;  Laterality: Right;  . BREAST SURGERY  2011   biospy of right breast  . CATARACT EXTRACTION W/ INTRAOCULAR LENS  IMPLANT, BILATERAL  2004  . FLEXIBLE SIGMOIDOSCOPY N/A 05/18/2019   Procedure: FLEXIBLE SIGMOIDOSCOPY,  TRANSANAL EXCISION inclusion cyst;  Surgeon: Leighton Ruff, MD;  Location: [facility];  Service: General;  Laterality: N/A;  . gluteus medius repair  2017  . LUMBAR LAMINECTOMY  2013   L5 -- S1  . PORT-A-CATH REMOVAL Right 09/27/2019   Procedure: PORT REMOVAL;  Surgeon: Erroll Luna, MD;  Location: [facility];  Service: General;  Laterality: Right;  . PORTACATH PLACEMENT Right 05/15/2019   Procedure: INSERTION PORT-A-CATH WITH ULTRASOUND;  Surgeon: Erroll Luna, MD;  Location: [facility];  Service: General;  Laterality: Right;  . ROTATOR CUFF REPAIR Left 2001     OB History   No obstetric history on file.     Family History  Problem Relation Age of Onset  . COPD Mother   . Cancer Mother 35       colon  . Arthritis Mother   . Hypertension Mother   . Miscarriages / [place] Mother   . Stroke Mother 96  . Alzheimer's disease  Mother 80  . Heart disease Father   . Heart attack Father 57  . Lung disease Father   . Arthritis Sister   . Breast cancer Sister 15       Lumpectomy  . Diabetes Maternal Grandmother   . Breast cancer Niece 74       Louise's Daughter  . Cervical cancer Niece 79    Social History   Tobacco Use  . Smoking status: Never Smoker  . Smokeless tobacco: Never Used  Substance Use Topics  . Alcohol use: Yes    Comment: Cocktails-3 days per week  . Drug use: Never    Types: Amphetamines    Home Medications Prior to Admission medications   Medication Sig Start Date End Date Taking? Authorizing Provider  acyclovir ointment (ZOVIRAX) 5 % Apply 1 application topically every 3 (three) hours for 7 days. 10/05/19 10/12/19  Caren Macadam, MD  Calcium-Phosphorus-Vitamin D (CITRACAL CALCIUM GUMMIES PO) Take 2 each by mouth daily.    [provider]  Cyanocobalamin (VITAMIN B 12 PO) Take 1,000 mg by mouth daily. sublingal    [provider]  gabapentin (NEURONTIN) 100 MG capsule Take 1-2 capsules (100-200 mg total) by mouth 3 (three) times daily as needed. 10/05/19   Caren Macadam, MD  lidocaine (XYLOCAINE) 5 % ointment Apply sparingly up to TID for pain 10/05/19   Caren Macadam, MD  methylcellulose oral powder Take by mouth every evening.     [provider]  metoprolol tartrate (LOPRESSOR) 25 MG tablet Take 1/2 (one-half) tablet by mouth twice daily 09/04/19   Caren Macadam, MD  simvastatin (ZOCOR) 40 MG tablet Take 1 tablet by mouth once daily 07/25/19   Koberlein, Steele Berg, MD  valACYclovir (VALTREX) 1000 MG tablet Take 1 tablet (1,000 mg total) by mouth 3 (three) times daily for 7 days. 10/05/19 10/12/19  Caren Macadam, MD    Allergies    Penicillins and Sulfa antibiotics  Review of Systems   Review of Systems  Constitutional: Negative for fever.  HENT: Negative for sore throat.   Eyes: Negative for photophobia, pain, discharge and visual  disturbance.  Respiratory: Negative for cough.   Gastrointestinal: Negative for vomiting.  Musculoskeletal: Negative for neck pain and neck stiffness.  Skin: Positive for rash.  Neurological: Negative for headaches.    Physical Exam Updated Vital Signs BP (!) 119/98 (BP Location: Left Arm)   Pulse 92   Temp 99.4 F (37.4 C) (Oral)   Resp 14   Ht 1.575 m (5\' 2" )   Wt 60 kg   SpO2 100%   BMI 24.19 kg/m   Physical Exam Vitals and nursing note reviewed.  Constitutional:      Appearance: Normal appearance. She is well-developed.  HENT:     Head: Atraumatic.     Ears:  Comments: No ear lesions.     Nose: No congestion or rhinorrhea.     Mouth/Throat:     Mouth: Mucous membranes are moist.     Pharynx: Oropharynx is clear. No oropharyngeal exudate or posterior oropharyngeal erythema.  Eyes:     General: No scleral icterus.       Right eye: No discharge.        Left eye: No discharge.     Extraocular Movements: Extraocular movements intact.     Pupils: Pupils are equal, round, and reactive to light.     Comments: No pain w eom. No keratitis or iritis. No corneal ulcer, dye uptake or dendritic lesion.   Neck:     Trachea: No tracheal deviation.  Cardiovascular:     Rate and Rhythm: Normal rate and regular rhythm.     Pulses: Normal pulses.     Heart sounds: Normal heart sounds. No murmur. No friction rub. No gallop.   Pulmonary:     Effort: Pulmonary effort is normal. No respiratory distress.     Breath sounds: Normal breath sounds.  Abdominal:     General: Bowel sounds are normal. There is no distension.     Palpations: Abdomen is soft.     Tenderness: There is no abdominal tenderness. There is no guarding.  Genitourinary:    Comments: No cva tenderness.  Musculoskeletal:        General: No swelling.     Cervical back: Normal range of motion and neck supple. No rigidity. No muscular tenderness.  Skin:    General: Skin is warm and dry.     Findings: No rash.      Comments: Rash c/w shingles to left side of scalp and face. No cellulitis.   Neurological:     Mental Status: She is alert.     Comments: Alert, speech normal. Steady gait.   Psychiatric:        Mood and Affect: Mood normal.     ED Results / Procedures / Treatments   Labs (all labs ordered are listed, but only abnormal results are displayed) Labs Reviewed - No data to display  EKG None  Radiology No results found.  Procedures Procedures (including critical care time)  Medications Ordered in ED Medications  fluorescein ophthalmic strip 1 strip (has no administration in time range)    ED Course  I have reviewed the triage vital signs and the nursing notes.  Pertinent labs & imaging results that were available during my care of the patient were reviewed by me and considered in my medical decision making (see chart for details).    MDM Rules/Calculators/A&P                      Tetracaine, fluorescein staining - no corneal lesion/ulcer, dendritic lesion, or iritis.   Reviewed nursing notes and prior charts for additional history.   Patient encourage to complete course of valtrex.  At this point pt requested additional tx/steroid tx - I discussed studies without clear and/or consistent benefit, however pt requests tx.   Will give rx prednisone.   Rec pcp f/u.  Return precautions provided.    Final Clinical Impression(s) / ED Diagnoses Final diagnoses:  None    Rx / DC Orders ED Discharge Orders    None       Lajean Saver, MD 10/08/19 1306

## 2019-10-08 NOTE — Progress Notes (Signed)
Virtual Visit via Video Note  I connected with Joanne Hansen on 10/08/19 at 11:00 AM EST by a video enabled telemedicine application 2/2 [id number] pandemic and verified that I am speaking with the correct person using two identifiers.  Location patient: home Location provider:work or home office Persons participating in the virtual visit: patient, provider  I discussed the limitations of evaluation and management by telemedicine and the availability of in person appointments. The patient expressed understanding and agreed to proceed.   HPI: Pt is a 74 yo female with pmh sig for PAF, goiter, h/o R breast cancer ER-, HLD, macrocytic anemia followed by Dr. Ethlyn Gallery.  Pt seen 2/12 for pain and vesicles of L face for 2 days.  Pt dx'd with shingles, given rx for valtrex po, gabapentin, and acyclovir ointment (did not pick up).  Seen by Ophthalmology, Dr. Annamaria Boots last wk.  Per pt pressure was ok.  Has an appt this afternoon with her regular Ophthalmologist Dr. Nancy Fetter.    Pt notes pain and rash are worse despite valtrex and gabapentin 200 mg TID.  Vesicles, edema, and erythema spreading across midline of face.  Pt denies changes in vision.  Pt states this is "worse than having cancer".    Pt had her 1 st COVID vaccine.  She was scheduled to get the 2nd dose today, but has rescheduled it until March.   ROS: See pertinent positives and negatives per HPI.  Past Medical History:  Diagnosis Date  . Arthritis   . Family history of brain cancer   . Family history of breast cancer   . Family history of colon cancer   . Fecal incontinence    05-16-2019 per pt only a little leakage but controllable (was in clinical trial for [facility] without benefit.)  . Frequent headaches   . History of syncope    pre-syncope  08/ 2015  per pt no issue with this since   . Hyperlipidemia   . Malignant neoplasm of upper-inner quadrant of right breast in female, estrogen receptor negative [facility]) oncologist-- dr Jana Hakim   dr moody   dx 08/ 2020--  invasive ductal carcinoma,  05-13-2019  s/p right breast lumpectomy   . PAF (paroxysmal atrial fibrillation) ([place]) (05-16-2019   pt from [place], recently moved to [place] to be near daughter, has not established a cardiology yet--  previously seen by dr Lowella Dandy cossu (last office note dated 02-28-2018 scanned in epic)---  echo 04-15-2014  ef 55-60%, G1DD mild AR without stenosis, RVSp 70mmHg  . Rectal cyst   . Thyroid goiter    pt ultrasound in epic 06-04-2014 , nodules, no bx done  . Urinary incontinence, mixed     Past Surgical History:  Procedure Laterality Date  . ABDOMINAL HYSTERECTOMY  1978   w/  RSO  and APPENDECTOMY  . BREAST LUMPECTOMY WITH RADIOACTIVE SEED AND SENTINEL LYMPH NODE BIOPSY Right 05/15/2019   Procedure: RIGHT BREAST LUMPECTOMY WITH RADIOACTIVE SEED AND SENTINEL LYMPH NODE MAPPING;  Surgeon: Erroll Luna, MD;  Location: [facility];  Service: General;  Laterality: Right;  . BREAST SURGERY  2011   biospy of right breast  . CATARACT EXTRACTION W/ INTRAOCULAR LENS  IMPLANT, BILATERAL  2004  . FLEXIBLE SIGMOIDOSCOPY N/A 05/18/2019   Procedure: FLEXIBLE SIGMOIDOSCOPY,  TRANSANAL EXCISION inclusion cyst;  Surgeon: Leighton Ruff, MD;  Location: [facility];  Service: General;  Laterality: N/A;  . gluteus medius repair  2017  . LUMBAR LAMINECTOMY  2013   L5 --  S1  . PORT-A-CATH REMOVAL Right 09/27/2019   Procedure: PORT REMOVAL;  Surgeon: Erroll Luna, MD;  Location: [facility];  Service: General;  Laterality: Right;  . PORTACATH PLACEMENT Right 05/15/2019   Procedure: INSERTION PORT-A-CATH WITH ULTRASOUND;  Surgeon: Erroll Luna, MD;  Location: [facility];  Service: General;  Laterality: Right;  . ROTATOR CUFF REPAIR Left 2001    Family History  Problem Relation Age of Onset  . COPD Mother   . Cancer Mother 19       colon  . Arthritis Mother   . Hypertension Mother   .  Miscarriages / [place] Mother   . Stroke Mother 18  . Alzheimer's disease Mother 33  . Heart disease Father   . Heart attack Father 72  . Lung disease Father   . Arthritis Sister   . Breast cancer Sister 79       Lumpectomy  . Diabetes Maternal Grandmother   . Breast cancer Niece 78       Louise's Daughter  . Cervical cancer Niece 49    Current Outpatient Medications:  .  acyclovir ointment (ZOVIRAX) 5 %, Apply 1 application topically every 3 (three) hours for 7 days., Disp: 5 g, Rfl: 1 .  Calcium-Phosphorus-Vitamin D (CITRACAL CALCIUM GUMMIES PO), Take 2 each by mouth daily., Disp: , Rfl:  .  Cyanocobalamin (VITAMIN B 12 PO), Take 1,000 mg by mouth daily. sublingal, Disp: , Rfl:  .  gabapentin (NEURONTIN) 100 MG capsule, Take 1-2 capsules (100-200 mg total) by mouth 3 (three) times daily as needed., Disp: 90 capsule, Rfl: 1 .  lidocaine (XYLOCAINE) 5 % ointment, Apply sparingly up to TID for pain, Disp: 60 g, Rfl: 2 .  methylcellulose oral powder, Take by mouth every evening. , Disp: , Rfl:  .  metoprolol tartrate (LOPRESSOR) 25 MG tablet, Take 1/2 (one-half) tablet by mouth twice daily, Disp: 90 tablet, Rfl: 0 .  simvastatin (ZOCOR) 40 MG tablet, Take 1 tablet by mouth once daily, Disp: 90 tablet, Rfl: 1 .  valACYclovir (VALTREX) 1000 MG tablet, Take 1 tablet (1,000 mg total) by mouth 3 (three) times daily for 7 days., Disp: 21 tablet, Rfl: 0  Current Facility-Administered Medications:  .  ondansetron (ZOFRAN) injection 4 mg, 4 mg, Intravenous, Once PRN, Magrinat, Virgie Dad, MD  EXAM:  VITALS per patient if applicable:  GENERAL: alert, oriented, appears well and in no acute distress  HEENT: atraumatic, conjunctiva clear, no obvious abnormalities on inspection of external nose and ears  NECK: normal movements of the head and neck  LUNGS: on inspection no signs of respiratory distress, breathing rate appears normal, no obvious gross SOB, gasping or wheezing  CV: no obvious  cyanosis  SKIN: Vesicles on tip of nose, L nasolabial sulcus, near L temporal area at hairline.  L upper lip, L periorbital, and L cheek edema.  L conjunctivitis.  R eye normal in appearance.    MS: moves all visible extremities without noticeable abnormality  PSYCH/NEURO: pleasant and cooperative, no obvious depression or anxiety, speech and thought processing grossly intact  ASSESSMENT AND PLAN:  Discussed the following assessment and plan:  Herpes zoster with ophthalmic complication, unspecified herpes zoster eye disease -given facial involvement and worsened symptoms pt advised to proceed to the nearest ED for further treatment. -Pt to notify Dr. Nancy Fetter   I discussed the assessment and treatment plan with the patient. The patient was provided an opportunity to ask questions and all were answered. The  patient agreed with the plan and demonstrated an understanding of the instructions.   The patient was advised to call back or seek an in-person evaluation if the symptoms worsen or if the condition fails to improve as anticipated.   Billie Ruddy, MD

## 2019-10-08 NOTE — ED Triage Notes (Signed)
Patient states she was diagnosed with Shngles on her face  X 3 days. Patient states she has been on Valtrex, but eyes have worsened.

## 2019-10-08 NOTE — Telephone Encounter (Signed)
See prior phone note-patient called in and appt scheduled with Dr Volanda Napoleon at [facility].

## 2019-10-08 NOTE — Telephone Encounter (Signed)
-----   Message from Caren Macadam, MD sent at 10/07/2019 10:55 PM EST ----- Please check in with her and see how rash and pain are doing.

## 2019-10-08 NOTE — Discharge Instructions (Addendum)
It was our pleasure to provide your ER care today - we hope that you feel better.  Complete the full course of your valtrex. You may also take prednisone as prescribed.   Take acetaminophen as need for pain.  Follow up with your doctor in the coming week.  Return to ER if worse, new symptoms, severe eye pain, change in vision, severe headache, spreading redness, high fevers, or other concern.

## 2019-10-08 NOTE — Telephone Encounter (Signed)
Pt had a virtual visit on Friday and said with taking the medication her shingles have became worse. The side of her face is deformed and would like help with this as soon as possible.   Patient Phone: [phone]

## 2019-10-08 NOTE — Telephone Encounter (Signed)
Spoke with Dr Elease Hashimoto and informed him of the message below as PCP is out of the office.  Dr Elease Hashimoto stated the pt should have a follow up for evaluation.  I called the pt and scheduled a virtual visit with Dr Volanda Napoleon today at [facility].

## 2019-10-09 ENCOUNTER — Encounter: Payer: Self-pay | Admitting: Gastroenterology

## 2019-10-09 ENCOUNTER — Telehealth: Payer: Self-pay | Admitting: Radiation Oncology

## 2019-10-09 NOTE — Telephone Encounter (Signed)
  Radiation Oncology         [phone] ________________________________  Name: Joanne Hansen MRN: [id number]  Date of Service: 10/09/2019  DOB: 26-Sep-1945  Post Treatment Telephone Note  Diagnosis:  Stage IB, pT1bN0M0 grade 3, triple negative invasive ductal carcinoma of the right breast.  Interval Since Last Radiation:  3 weeks   08/27/19-09/21/19: The right breast was treated to 42.56 Gy in 16 fractions followed by an 8 Gy boost to the surgical cavity.  Narrative:  The patient was contacted today for routine follow-up. During treatment she did very well with radiotherapy and did not have significant desquamation. She reports she is doing okay. She unfortunately developed shings on her face on the left side. She states this has been worse than any other treatment she had for her breast cancer but is slowly noticing improvement in the blistered areas.  Impression/Plan: 1. Stage IB, pT1bN0M0 grade 3, triple negative invasive ductal carcinoma of the right breast. The patient has been doing well since completion of radiotherapy. We discussed that we would be happy to continue to follow her as needed, but she will also continue to follow up with Dr. Jana Hakim in medical oncology. She was counseled on skin care as well as measures to avoid sun exposure to this area.  2. Survivorship. We discussed the importance of survivorship evaluation and encouraged her to attend her upcoming visit with that clinic. 3. Zoster. She will continue with her valtrex and neurontin and prednisone per her PCP and ED encounters. We will follow this expectantly.   Carola Rhine, PAC

## 2019-10-11 NOTE — Progress Notes (Signed)
  Radiation Oncology         [phone] ________________________________  Name: Joanne Hansen MRN: [id number]  Date: 09/21/2019  DOB: Nov 10, 1945  End of Treatment Note  Diagnosis:   right-sided breast cancer     Indication for treatment:  Curative       Radiation treatment dates:   08/27/19 - 09/21/19  Site/dose:   The patient initially received a dose of 42.56 Gy in 16 fractions to the breast using whole-breast tangent fields. This was delivered using a 3-D conformal technique. The patient then received a boost to the seroma. This delivered an additional 10 Gy in 34fractions using an en face electron field due to the depth of the seroma. The total dose was 52.56Gy.  Narrative: The patient tolerated radiation treatment relatively well.   The patient had some expected skin irritation as she progressed during treatment. Moist desquamation was not present at the end of treatment.  Plan: The patient has completed radiation treatment. The patient will return to radiation oncology clinic for routine followup in one month. I advised the patient to call or return sooner if they have any questions or concerns related to their recovery or treatment. ________________________________  Jodelle Gross, M.D., Ph.D.

## 2019-10-14 ENCOUNTER — Ambulatory Visit: Payer: Medicare Other

## 2019-10-19 ENCOUNTER — Ambulatory Visit: Payer: Medicare Other

## 2019-10-22 ENCOUNTER — Ambulatory Visit: Payer: Medicare Other | Attending: Internal Medicine

## 2019-10-22 DIAGNOSIS — Z23 Encounter for immunization: Secondary | ICD-10-CM

## 2019-10-22 NOTE — Progress Notes (Signed)
   Covid-19 Vaccination Clinic  Name:  Joanne Hansen    MRN: [id number] DOB: February 14, 1946  10/22/2019  Joanne Hansen was observed post Covid-19 immunization for 15 minutes without incidence. She was provided with Vaccine Information Sheet and instruction to access the V-Safe system.   Joanne Hansen was instructed to call 911 with any severe reactions post vaccine: Marland Kitchen Difficulty breathing  . Swelling of your face and throat  . A fast heartbeat  . A bad rash all over your body  . Dizziness and weakness    Immunizations Administered    Name Date Dose VIS Date Route   Pfizer COVID-19 Vaccine 10/22/2019 11:01 AM 0.3 mL 08/03/2019 Intramuscular   Manufacturer: [place]   Lot: [id number]   [place]: [id number]

## 2019-11-05 ENCOUNTER — Ambulatory Visit (INDEPENDENT_AMBULATORY_CARE_PROVIDER_SITE_OTHER): Payer: Medicare Other | Admitting: Gastroenterology

## 2019-11-05 ENCOUNTER — Encounter: Payer: Self-pay | Admitting: Gastroenterology

## 2019-11-05 VITALS — BP 108/64 | HR 72 | Temp 97.7°F | Ht 62.0 in | Wt 129.0 lb

## 2019-11-05 DIAGNOSIS — Z8719 Personal history of other diseases of the digestive system: Secondary | ICD-10-CM

## 2019-11-05 MED ORDER — NA SULFATE-K SULFATE-MG SULF 17.5-3.13-1.6 GM/177ML PO SOLN: 1.0000 | Freq: Once | ORAL | 0 refills | Status: AC

## 2019-11-05 NOTE — Patient Instructions (Addendum)
If you are age 72 or older, your body mass index should be between 23-30. Your Body mass index is 23.59 kg/m. If this is out of the aforementioned range listed, please consider follow up with your Primary Care Provider.  If you are age 83 or younger, your body mass index should be between 19-25. Your Body mass index is 23.59 kg/m. If this is out of the aformentioned range listed, please consider follow up with your Primary Care Provider.   Due to recent changes in healthcare laws, you may see the results of your imaging and laboratory studies on MyChart before your provider has had a chance to review them.  We understand that in some cases there may be results that are confusing or concerning to you. Not all laboratory results come back in the same time frame and the provider may be waiting for multiple results in order to interpret others.  Please give [place] 48 hours in order for your provider to thoroughly review all the results before contacting the office for clarification of your results.   Continue Methylcellulose daily  Tips for colonoscopy:  - Stay well hydrated for 3-4 days prior to the exam. This reduces nausea and dehydration.  - To prevent skin/hemorrhoid irritation - prior to wiping, put A&Dointment or vaseline on the toilet paper. - Keep a towel or pad on the bed.  - Drink  64oz of clear liquids in the morning of prep day (prior to starting the prep) to be sure that there is enough fluid to flush the colon and stay hydrated!!!! This is in addition to the fluids required for preparation. - Use of a flavored hard candy, such as grape Anise Salvo, can counteract some of the flavor of the prep and may prevent some nausea.

## 2019-11-05 NOTE — Progress Notes (Addendum)
Referring Provider: Caren Macadam, MD Primary Care Physician:  Caren Macadam, MD  Reason for Consultation:  Rectal polyp   IMPRESSION:  Polyp in the proximal rectum noted by Dr. Marcello Moores Recent transanal excision of distal rectal cyst with Dr. Marcello Moores Fecal incontinence x years    - normal sigmoid colon biopsies on colonoscopy 06/15/11    - improved on methylcellulose Fair prep on colonoscopy 2012 Mother with "intestinal cancer" at age 72  Colonoscopy recommended given the recently identified rectal polyp. She feels up to endoscopy after recently completing chemotherapy and radiation.    Fecal incontinence x years: Continue methylcellulose PRN given the symptomatic improvement.   PLAN: Continue with daily methylcellulose Colonoscopy with 2 day bowel prep given the history of fair prep on colonoscopy 2012  Please see the "Patient Instructions" section for addition details about the plan.  HPI: Joanne Hansen is a 74 y.o. female with a rectal polyp. History is obtained through the patient, review of her electronic health records, review of records from her gastroenterologist in [place], and review of records from Dr. Marcello Moores from 2020. Moved to [place] from November 2019. Diagnosed with breast cancer in 2020 and completed chemotherapy 08/01/19 and radiation therapy 09/21/19. Shingles 4-5 weeks ago.  She has completed her Covid vaccines.   Several year history of fecal incontinence recently evaluated by Dr. Marcello Moores. Leakage after bowel movements. Occasional incontinence to flatus. She was in a study in [place] for [place] injections. Treatment offered no improvement, but, she wonders if this ultimately worsened her symptoms. Started on methylcelluose by Dr. Marcello Moores. Uses it most days with improvement in symptoms. Will have difficult defecation if she uses it daily. Has known hemorrhoids that intermittent bleeding. No rectal pain.  Has transanal excision of a distal rectal cyst  in September. Proximal rectal polyp identified at that time. Colonoscopy recommended.  Has recently seen some blood in the stool. No mucous.   Prior endoscopic history: - Screening colonoscopy 1998, 2005 in [place] - Colonoscopy for rectal bleeding, change in bowel habits, constipation performed at [place] [facility] by Dr. Douglass Rivers 06/15/2011.  Bowel prep noted to be fair.  Nonbleeding external hemorrhoids seen.  Localized mild inflammation was found in the sigmoid colon.  Biopsied.  Diverticulosis in the left colon.  Biopsies showed unremarkable sigmoid colonic mucosa. - EGD 05/18/2001 for epigastric pain and nausea: Mild reflux esophagitis, hiatal hernia, acute gastritis with erosion  Mother had "intestinal cancer" and had 18 inches of colon removed at age 60. No further problems with her GI tract prior to her death. No other known family history of colon cancer or polyps. No family history of uterine/endometrial cancer, pancreatic cancer or gastric/stomach cancer.   Past Medical History:  Diagnosis Date  . Arthritis   . Family history of brain cancer   . Family history of breast cancer   . Family history of colon cancer   . Fecal incontinence    05-16-2019 per pt only a little leakage but controllable (was in clinical trial for [facility] without benefit.)  . Frequent headaches   . History of syncope    pre-syncope  08/ 2015  per pt no issue with this since   . Hyperlipidemia   . Malignant neoplasm of upper-inner quadrant of right breast in female, estrogen receptor negative [facility]) oncologist-- dr Jana Hakim  dr moody   dx 08/ 2020--  invasive ductal carcinoma,  05-13-2019  s/p right breast lumpectomy   . PAF (paroxysmal atrial fibrillation) ([place]) (05-16-2019  pt from [place], recently moved to [place] to be near daughter, has not established a cardiology yet--  previously seen by dr Lowella Dandy cossu (last office note dated 02-28-2018 scanned in epic)---  echo 04-15-2014  ef 55-60%, G1DD  mild AR without stenosis, RVSp 35mmHg  . Rectal cyst   . Thyroid goiter    pt ultrasound in epic 06-04-2014 , nodules, no bx done  . Urinary incontinence, mixed     Past Surgical History:  Procedure Laterality Date  . ABDOMINAL HYSTERECTOMY  1978   w/  RSO  and APPENDECTOMY  . BREAST LUMPECTOMY WITH RADIOACTIVE SEED AND SENTINEL LYMPH NODE BIOPSY Right 05/15/2019   Procedure: RIGHT BREAST LUMPECTOMY WITH RADIOACTIVE SEED AND SENTINEL LYMPH NODE MAPPING;  Surgeon: Erroll Luna, MD;  Location: [facility];  Service: General;  Laterality: Right;  . BREAST SURGERY  2011   biospy of right breast  . CATARACT EXTRACTION W/ INTRAOCULAR LENS  IMPLANT, BILATERAL  2004  . FLEXIBLE SIGMOIDOSCOPY N/A 05/18/2019   Procedure: FLEXIBLE SIGMOIDOSCOPY,  TRANSANAL EXCISION inclusion cyst;  Surgeon: Leighton Ruff, MD;  Location: [facility];  Service: General;  Laterality: N/A;  . gluteus medius repair  2017  . LUMBAR LAMINECTOMY  2013   L5 -- S1  . PORT-A-CATH REMOVAL Right 09/27/2019   Procedure: PORT REMOVAL;  Surgeon: Erroll Luna, MD;  Location: [facility];  Service: General;  Laterality: Right;  . PORTACATH PLACEMENT Right 05/15/2019   Procedure: INSERTION PORT-A-CATH WITH ULTRASOUND;  Surgeon: Erroll Luna, MD;  Location: [facility];  Service: General;  Laterality: Right;  . ROTATOR CUFF REPAIR Left 2001    Current Outpatient Medications  Medication Sig Dispense Refill  . Calcium-Phosphorus-Vitamin D (CITRACAL CALCIUM GUMMIES PO) Take 2 each by mouth daily.    . Cyanocobalamin (VITAMIN B 12 PO) Take 1,000 mg by mouth daily. sublingal    . hydrocortisone 2.5 % ointment Apply topically 2 (two) times daily.    Marland Kitchen loteprednol (LOTEMAX) 0.2 % SUSP 1 drop daily.    . methylcellulose oral powder Take by mouth every evening.     . metoprolol tartrate (LOPRESSOR) 25 MG tablet Take 1/2 (one-half) tablet by mouth twice daily 90 tablet 0  .  simvastatin (ZOCOR) 40 MG tablet Take 1 tablet by mouth once daily 90 tablet 1   Current Facility-Administered Medications  Medication Dose Route Frequency Provider Last Rate Last Admin  . ondansetron (ZOFRAN) injection 4 mg  4 mg Intravenous Once PRN Magrinat, Virgie Dad, MD        Allergies as of 11/05/2019 - Review Complete 11/05/2019  Allergen Reaction Noted  . Penicillins Swelling 11/08/2018  . Sulfa antibiotics Nausea And Vomiting 05/16/2019    Family History  Problem Relation Age of Onset  . COPD Mother   . Cancer Mother 60       colon  . Arthritis Mother   . Hypertension Mother   . Miscarriages / [place] Mother   . Stroke Mother 71  . Alzheimer's disease Mother 72  . Heart disease Father   . Heart attack Father 13  . Lung disease Father   . Arthritis Sister   . Breast cancer Sister 15       Lumpectomy  . Diabetes Maternal Grandmother   . Breast cancer Niece 33       Louise's Daughter  . Cervical cancer Niece 72    Social History   Socioeconomic History  . Marital status: Married  Spouse name: Not on file  . Number of children: Not on file  . Years of education: Not on file  . Highest education level: Not on file  Occupational History  . Not on file  Tobacco Use  . Smoking status: Never Smoker  . Smokeless tobacco: Never Used  Substance and Sexual Activity  . Alcohol use: Yes    Comment: Cocktails-3 days per week  . Drug use: Never    Types: Amphetamines  . Sexual activity: Not on file  Other Topics Concern  . Not on file  Social History Narrative  . Not on file   Social Determinants of Health   Financial Resource Strain:   . Difficulty of Paying Living Expenses:   Food Insecurity:   . Worried About Charity fundraiser in the Last Year:   . Arboriculturist in the Last Year:   Transportation Needs: No Transportation Needs  . Lack of Transportation (Medical): No  . Lack of Transportation (Non-Medical): No  Physical Activity:   . Days of  Exercise per Week:   . Minutes of Exercise per Session:   Stress:   . Feeling of Stress :   Social Connections:   . Frequency of Communication with Friends and Family:   . Frequency of Social Gatherings with Friends and Family:   . Attends Religious Services:   . Active Member of Clubs or Organizations:   . Attends Archivist Meetings:   Marland Kitchen Marital Status:   Intimate Partner Violence:   . Fear of Current or Ex-Partner:   . Emotionally Abused:   Marland Kitchen Physically Abused:   . Sexually Abused:     Physical Exam: General:   Alert,  well-nourished, pleasant and cooperative in NAD Head:  Normocephalic and atraumatic. Eyes:  Sclera clear, no icterus.   Conjunctiva pink. Ears:  Normal auditory acuity. Nose:  No deformity, discharge,  or lesions. Mouth:  No deformity or lesions.   Neck:  Supple; no masses or thyromegaly. Lungs:  Clear throughout to auscultation.   No wheezes. Heart:  Regular rate and rhythm; no murmurs. Abdomen:  Soft, nontender, nondistended, normal bowel sounds, no rebound or guarding. No hepatosplenomegaly.   Rectal:  Deferred  Msk:  Symmetrical. No boney deformities LAD: No inguinal or umbilical LAD Extremities:  No clubbing or edema. Neurologic:  Alert and  oriented x4;  grossly nonfocal Skin:  Intact without significant lesions or rashes. Psych:  Alert and cooperative. Normal mood and affect.   Juvia Aerts L. Tarri Glenn, MD, MPH 11/05/2019, 11:06 AM

## 2019-11-21 ENCOUNTER — Ambulatory Visit (AMBULATORY_SURGERY_CENTER): Payer: Medicare Other | Admitting: Gastroenterology

## 2019-11-21 ENCOUNTER — Encounter: Payer: Self-pay | Admitting: Gastroenterology

## 2019-11-21 ENCOUNTER — Other Ambulatory Visit: Payer: Self-pay

## 2019-11-21 VITALS — BP 118/47 | HR 67 | Temp 97.5°F | Resp 13 | Ht 62.0 in | Wt 129.0 lb

## 2019-11-21 DIAGNOSIS — D128 Benign neoplasm of rectum: Secondary | ICD-10-CM

## 2019-11-21 DIAGNOSIS — Z8719 Personal history of other diseases of the digestive system: Secondary | ICD-10-CM | POA: Diagnosis not present

## 2019-11-21 DIAGNOSIS — K5730 Diverticulosis of large intestine without perforation or abscess without bleeding: Secondary | ICD-10-CM | POA: Diagnosis not present

## 2019-11-21 DIAGNOSIS — K621 Rectal polyp: Secondary | ICD-10-CM

## 2019-11-21 HISTORY — PX: COLONOSCOPY: SHX174

## 2019-11-21 MED ORDER — SODIUM CHLORIDE 0.9 % IV SOLN: 500.0000 mL | Freq: Once | INTRAVENOUS | Status: DC

## 2019-11-21 NOTE — Op Note (Signed)
[facility] Patient Name: Kenneth Daigler Procedure Date: 11/21/2019 3:40 PM MRN: [id number] Endoscopist: Thornton Park MD, MD Age: 74 Referring MD:  Date of Birth: Jul 11, 1946 Gender: Female Account #: [id number] Procedure:                Colonoscopy Indications:              Polyp in the proximal rectum noted by Dr. Marcello Moores on                            recent transanal excision of distal rectal cyst                           Fecal incontinence x years                           - normal sigmoid colon biopsies on colonoscopy                            06/15/11                           - improved on methylcellulose                           Fair prep on colonoscopy 2012                           Mother with "intestinal cancer" at age 64 Medicines:                Monitored Anesthesia Care Procedure:                Pre-Anesthesia Assessment:                           - Prior to the procedure, a History and Physical                            was performed, and patient medications and                            allergies were reviewed. The patient's tolerance of                            previous anesthesia was also reviewed. The risks                            and benefits of the procedure and the sedation                            options and risks were discussed with the patient.                            All questions were answered, and informed consent                            was obtained. Prior  Anticoagulants: The patient has                            taken no previous anticoagulant or antiplatelet                            agents. ASA Grade Assessment: II - A patient with                            mild systemic disease. After reviewing the risks                            and benefits, the patient was deemed in                            satisfactory condition to undergo the procedure.                           After obtaining informed consent, the  colonoscope                            was passed under direct vision. Throughout the                            procedure, the patient's blood pressure, pulse, and                            oxygen saturations were monitored continuously. The                            Colonoscope was introduced through the anus and                            advanced to the 4 cm into the ileum. A second                            forward view of the right colon was performed. The                            colonoscopy was performed without difficulty. The                            patient tolerated the procedure well. The quality                            of the bowel preparation was good. The terminal                            ileum, ileocecal valve, appendiceal orifice, and                            rectum were photographed. Scope In: 3:45:21 PM Scope Out: 4:07:43 PM Scope Withdrawal Time: 0 hours 16 minutes 2 seconds  Total Procedure  Duration: 0 hours 22 minutes 22 seconds  Findings:                 The perianal and digital rectal examinations were                            normal.                           A 6 mm polyp was found in the rectum approximately                            12cm from the anal verge. The polyp was                            semi-pedunculated. The polyp was removed with a                            cold snare. Resection and retrieval were complete.                            Estimated blood loss was minimal.                           Multiple small and large-mouthed diverticula were                            found in the sigmoid colon and descending colon.                           The exam was otherwise without abnormality on                            direct and retroflexion views. Complications:            No immediate complications. Estimated blood loss:                            Minimal. Estimated Blood Loss:     Estimated blood loss was minimal. Impression:                - One 6 mm polyp in the rectum, removed with a cold                            snare. Resected and retrieved.                           - Diverticulosis in the sigmoid colon and in the                            descending colon.                           - The examination was otherwise normal on direct  and retroflexion views. Recommendation:           - Patient has a contact number available for                            emergencies. The signs and symptoms of potential                            delayed complications were discussed with the                            patient. Return to normal activities tomorrow.                            Written discharge instructions were provided to the                            patient.                           - Continue present medications.                           - Await pathology results.                           - Repeat colonoscopy date to be determined after                            pending pathology results are reviewed for                            surveillance.                           - Two day prep recommended with all future                            colonoscopies.                           - Follow a high fiber diet. Drink at least 64                            ounces of water daily. Add a daily stool bulking                            agent such as psyllium (an exampled would be                            Metamucil).                           - Emerging evidence supports eating a diet of                            fruits, vegetables, grains,  calcium, and yogurt                            while reducing red meat and alcohol may reduce the                            risk of colon cancer.                           - Thank you for allowing me to be involved in your                            colon cancer prevention. Thornton Park MD, MD 11/21/2019 4:17:16 PM This report has been signed  electronically.

## 2019-11-21 NOTE — Progress Notes (Signed)
PT taken to PACU. Monitors in place. VSS. Report given to RN. 

## 2019-11-21 NOTE — Patient Instructions (Signed)
Impression/Recommendations:  Polyp handout given to patient. Diverticulosis handout given to patient. High fiber diet handout given to patient.  Continue present medications. Await pathology results.  Repeat colonoscopy for surveillance.  Date to be determined after pathology results reviewed.  Drink at least 64 oz, if water daily.  Add a daily stool bulking age such as psyllium (ex. Metamucil).  YOU HAD AN ENDOSCOPIC PROCEDURE TODAY AT [facility]:   Refer to the procedure report that was given to you for any specific questions about what was found during the examination.  If the procedure report does not answer your questions, please call your gastroenterologist to clarify.  If you requested that your care partner not be given the details of your procedure findings, then the procedure report has been included in a sealed envelope for you to review at your convenience later.  YOU SHOULD EXPECT: Some feelings of bloating in the abdomen. Passage of more gas than usual.  Walking can help get rid of the air that was put into your GI tract during the procedure and reduce the bloating. If you had a lower endoscopy (such as a colonoscopy or flexible sigmoidoscopy) you may notice spotting of blood in your stool or on the toilet paper. If you underwent a bowel prep for your procedure, you may not have a normal bowel movement for a few days.  Please Note:  You might notice some irritation and congestion in your nose or some drainage.  This is from the oxygen used during your procedure.  There is no need for concern and it should clear up in a day or so.  SYMPTOMS TO REPORT IMMEDIATELY:   Following lower endoscopy (colonoscopy or flexible sigmoidoscopy):  Excessive amounts of blood in the stool  Significant tenderness or worsening of abdominal pains  Swelling of the abdomen that is new, acute  Fever of 100F or higher For urgent or emergent issues, a gastroenterologist can be  reached at any hour by calling [phone]. Do not use MyChart messaging for urgent concerns.    DIET:  We do recommend a small meal at first, but then you may proceed to your regular diet.  Drink plenty of fluids but you should avoid alcoholic beverages for 24 hours.  ACTIVITY:  You should plan to take it easy for the rest of today and you should NOT DRIVE or use heavy machinery until tomorrow (because of the sedation medicines used during the test).    FOLLOW UP: Our staff will call the number listed on your records 48-72 hours following your procedure to check on you and address any questions or concerns that you may have regarding the information given to you following your procedure. If we do not reach you, we will leave a message.  We will attempt to reach you two times.  During this call, we will ask if you have developed any symptoms of COVID 19. If you develop any symptoms (ie: fever, flu-like symptoms, shortness of breath, cough etc.) before then, please call [phone].  If you test positive for Covid 19 in the 2 weeks post procedure, please call and report this information to [place].    If any biopsies were taken you will be contacted by phone or by letter within the next 1-3 weeks.  Please call us at [phone] if you have not heard about the biopsies in 3 weeks.    SIGNATURES/CONFIDENTIALITY: You and/or your care partner have signed paperwork which will be entered into your  that the information above on your After Visit Summary has been reviewed and is understood.  Full responsibility of the confidentiality of this discharge information lies with you and/or your care-partner.  

## 2019-11-21 NOTE — Progress Notes (Signed)
Called to room to assist during endoscopic procedure.  Patient ID and intended procedure confirmed with present staff. Received instructions for my participation in the procedure from the performing physician.  

## 2019-11-21 NOTE — Progress Notes (Signed)
[place]

## 2019-11-26 ENCOUNTER — Telehealth: Payer: Self-pay | Admitting: *Deleted

## 2019-11-26 NOTE — Telephone Encounter (Signed)
  Follow up Call-  Call back number 11/21/2019  Post procedure Call Back phone  # [phone]  Permission to leave phone message Yes  Some recent data might be hidden     Patient questions:  Do you have a fever, pain , or abdominal swelling? No. Pain Score  0 *  Have you tolerated food without any problems? Yes.    Have you been able to return to your normal activities? Yes.    Do you have any questions about your discharge instructions: Diet   No. Medications  No. Follow up visit  No.  Do you have questions or concerns about your Care? No.  Actions: * If pain score is 4 or above: 1. No action needed, pain <4.Have you developed a fever since your procedure? no  2.   Have you had an respiratory symptoms (SOB or cough) since your procedure? no  3.   Have you tested positive for COVID 19 since your procedure no  4.   Have you had any family members/close contacts diagnosed with the COVID 19 since your procedure?  no   If yes to any of these questions please route to Joylene John, RN and Erenest Rasher, RN

## 2019-11-29 ENCOUNTER — Encounter: Payer: Self-pay | Admitting: Gastroenterology

## 2019-12-02 ENCOUNTER — Other Ambulatory Visit: Payer: Self-pay | Admitting: Family Medicine

## 2019-12-14 ENCOUNTER — Other Ambulatory Visit: Payer: Self-pay

## 2019-12-14 ENCOUNTER — Encounter: Payer: Self-pay | Admitting: Cardiovascular Disease

## 2019-12-14 ENCOUNTER — Ambulatory Visit (INDEPENDENT_AMBULATORY_CARE_PROVIDER_SITE_OTHER): Payer: Medicare Other | Admitting: Cardiovascular Disease

## 2019-12-14 VITALS — BP 120/74 | HR 62 | Ht 62.0 in | Wt 131.2 lb

## 2019-12-14 DIAGNOSIS — E782 Mixed hyperlipidemia: Secondary | ICD-10-CM

## 2019-12-14 DIAGNOSIS — I480 Paroxysmal atrial fibrillation: Secondary | ICD-10-CM | POA: Diagnosis not present

## 2019-12-14 NOTE — Assessment & Plan Note (Signed)
History of paroxysmal A. fib without recurrence with recent event monitor performed 08/05/2019 which revealed sinus rhythm with short runs of SVT.  She has had no recurrent clinical A. fib or anything caught on her event monitor.

## 2019-12-14 NOTE — Assessment & Plan Note (Signed)
History of hyperlipidemia on statin therapy with lipid profile performed 07/02/2019 revealing total cholesterol 159, LDL 79 and HDL 54.

## 2019-12-14 NOTE — Progress Notes (Signed)
12/14/2019 Joanne Hansen   [birth year]  [id number]  Primary Physician Caren Macadam, MD Primary Cardiologist: Lorretta Harp MD Lupe Carney, [place]  HPI:  Joanne Hansen is a 74 y.o.  thin appearing married Caucasian female mother of 1 child who recently relocated from the [place] to [place] to be closer to family.  She is retired from doing clerical work.  She was referred by Dr. Ethlyn Gallery , her PCP, for episodes of dizziness and remote PAF.  I last saw her in the office 08/02/2019.  She did see Coletta Memos, NP in the office 09/13/2019. Her only risk factor is mild hyperlipidemia on statin therapy.  Her father did have a myocardial infarction at age 31 had bypass surgery.  She is never smoked.  She is never had a heart attack or stroke.  She denies chest pain or shortness of breath but she does have fatigue.  She developed breast cancer in September and had lumpectomy and has just finished her last round of chemotherapy and scheduled to start radiation therapy.  She feels fatigued.  She said 6 episodes of dizziness since starting chemotherapy without frank syncope.  She did have PAF demonstrated by Holter monitor in [place] in 2014.  She was followed by a cardiologist there who did not anticoagulate her.  Since that time she has had infrequent episodes of brief tachypalpitations.  I did order an event monitor but resulted on 08/05/2019 that did not show any PAF.  She had short runs of PSVT.  A 2D echo performed 08/16/2019 was essentially normal.  Unfortunately she did contract herpes zoster on the left side of her face which she is significantly symptomatic from but is slowly improving on Valtrex.  She did have her last radiation treatment for her breast cancer 09/21/2019.   Current Meds  Medication Sig  . Calcium-Phosphorus-Vitamin D (CITRACAL CALCIUM GUMMIES PO) Take 2 each by mouth daily.  . carboxymethylcellulose (REFRESH PLUS) 0.5 % SOLN 1  drop 3 (three) times daily as needed.  . Cyanocobalamin (VITAMIN B 12 PO) Take 1,000 mg by mouth daily. sublingal  . Doxepin HCl, Antipruritic, (DOXEPIN HCL EX) Apply topically. Compounded with naltrexone/pramoxine/lidocaine  . hydrocortisone 2.5 % ointment Apply topically 2 (two) times daily.  . Lidocaine 5 % CREA Apply topically. Compounded with doxepin/pramoxine/naltrexone  . methylcellulose oral powder Take by mouth every evening.   . metoprolol tartrate (LOPRESSOR) 25 MG tablet TAKE 1/2 (ONE-HALF) TABLET BY MOUTH TWICE DAILY  . Naltrexone POWD by Does not apply route. Compounded with doxepin/pramoxine/lidocaine  . Pramoxine-HC 1-1 % OINT Apply topically. Compounded with doxepin/naltrexone/lidocaine  . simvastatin (ZOCOR) 40 MG tablet Take 1 tablet by mouth once daily   Current Facility-Administered Medications for the 12/14/19 encounter (Office Visit) with Lorretta Harp, MD  Medication  . 0.9 %  sodium chloride infusion     Allergies  Allergen Reactions  . Penicillins Swelling  . Sulfa Antibiotics Nausea And Vomiting    Social History   Socioeconomic History  . Marital status: Married    Spouse name: Not on file  . Number of children: Not on file  . Years of education: Not on file  . Highest education level: Not on file  Occupational History  . Not on file  Tobacco Use  . Smoking status: Never Smoker  . Smokeless tobacco: Never Used  Substance and Sexual Activity  . Alcohol use: Yes    Comment: Cocktails-3 days per  week  . Drug use: Never    Types: Amphetamines  . Sexual activity: Not on file  Other Topics Concern  . Not on file  Social History Narrative  . Not on file   Social Determinants of Health   Financial Resource Strain:   . Difficulty of Paying Living Expenses:   Food Insecurity:   . Worried About Charity fundraiser in the Last Year:   . Arboriculturist in the Last Year:   Transportation Needs: No Transportation Needs  . Lack of Transportation  (Medical): No  . Lack of Transportation (Non-Medical): No  Physical Activity:   . Days of Exercise per Week:   . Minutes of Exercise per Session:   Stress:   . Feeling of Stress :   Social Connections:   . Frequency of Communication with Friends and Family:   . Frequency of Social Gatherings with Friends and Family:   . Attends Religious Services:   . Active Member of Clubs or Organizations:   . Attends Archivist Meetings:   Marland Kitchen Marital Status:   Intimate Partner Violence:   . Fear of Current or Ex-Partner:   . Emotionally Abused:   Marland Kitchen Physically Abused:   . Sexually Abused:      Review of Systems: General: negative for chills, fever, night sweats or weight changes.  Cardiovascular: negative for chest pain, dyspnea on exertion, edema, orthopnea, palpitations, paroxysmal nocturnal dyspnea or shortness of breath Dermatological: negative for rash Respiratory: negative for cough or wheezing Urologic: negative for hematuria Abdominal: negative for nausea, vomiting, diarrhea, bright red blood per rectum, melena, or hematemesis Neurologic: negative for visual changes, syncope, or dizziness All other systems reviewed and are otherwise negative except as noted above.    Blood pressure 120/74, pulse 62, height 5\' 2"  (1.575 m), weight 131 lb 3.2 oz (59.5 kg), SpO2 96 %.  General appearance: alert and no distress Neck: no adenopathy, no carotid bruit, no JVD, supple, symmetrical, trachea midline and thyroid not enlarged, symmetric, no tenderness/mass/nodules Lungs: clear to auscultation bilaterally Heart: regular rate and rhythm, S1, S2 normal, no murmur, click, rub or gallop Extremities: extremities normal, atraumatic, no cyanosis or edema Pulses: 2+ and symmetric Skin: Skin color, texture, turgor normal. No rashes or lesions Neurologic: Alert and oriented X 3, normal strength and tone. Normal symmetric reflexes. Normal coordination and gait  EKG sinus rhythm at 62 without ST  or T wave changes.  Personally reviewed this EKG.  ASSESSMENT AND PLAN:   Hyperlipidemia History of hyperlipidemia on statin therapy with lipid profile performed 07/02/2019 revealing total cholesterol 159, LDL 79 and HDL 54.  Paroxysmal atrial fibrillation (HCC) History of paroxysmal A. fib without recurrence with recent event monitor performed 08/05/2019 which revealed sinus rhythm with short runs of SVT.  She has had no recurrent clinical A. fib or anything caught on her event monitor.      Lorretta Harp MD FACP,FACC,FAHA, [facility] 12/14/2019 11:42 AM

## 2019-12-14 NOTE — Patient Instructions (Signed)

## 2019-12-19 ENCOUNTER — Telehealth: Payer: Self-pay | Admitting: Adult Health

## 2019-12-19 NOTE — Telephone Encounter (Signed)
R/s appt per 4/28 sch message - unable to reach pt- left message with appt date and time

## 2019-12-19 NOTE — Progress Notes (Signed)
SURVIVORSHIP VISIT:    BRIEF ONCOLOGIC HISTORY:  Oncology History  Malignant neoplasm of upper-inner quadrant of right breast in female, estrogen receptor negative ([place])  05/07/2019 Initial Diagnosis   Malignant neoplasm of upper-inner quadrant of right breast in female, estrogen receptor negative ([facility])   05/07/2019 Cancer Staging   Staging form: Breast, AJCC 8th Edition - Clinical: Stage IB (cT1b, cN0, cM0, G2, ER-, PR-, HER2-)    05/15/2019 Genetic Testing   VUS in TSC2 called c.1014C>G identified on the [facility] Common Hereditary Cancers Panel + STAT Panel. No pathogenic variants identified. The STAT Breast cancer panel offered by [facility] includes sequencing and rearrangement analysis for the following 9 genes:  ATM, BRCA1, BRCA2, CDH1, CHEK2, PALB2, PTEN, STK11 and TP53.  The Common Hereditary Cancers Panel offered by [facility] includes sequencing and/or deletion duplication testing of the following 47 genes: APC, ATM, AXIN2, BARD1, BMPR1A, BRCA1, BRCA2, BRIP1, CDH1, CDKN2A (p14ARF), CDKN2A (p16INK4a), CKD4, CHEK2, CTNNA1, DICER1, EPCAM (Deletion/duplication testing only), GREM1 (promoter region deletion/duplication testing only), KIT, MEN1, MLH1, MSH2, MSH3, MSH6, MUTYH, NBN, NF1, NHTL1, PALB2, PDGFRA, PMS2, POLD1, POLE, PTEN, RAD50, RAD51C, RAD51D, SDHB, SDHC, SDHD, SMAD4, SMARCA4. STK11, TP53, TSC1, TSC2, and VHL.  The following genes were evaluated for sequence changes only: SDHA and HOXB13 c.251G>A variant only. The report date is 05/15/2019.    05/15/2019 Surgery   Right lumpectomy ([facility]) [phone]): IDC, grade III, 1.0 cm. Margins negative. Triple negative. 2 lymph nodes negative for carcinoma.   05/29/2019 - 07/31/2019 Chemotherapy   palonosetron (ALOXI) injection 0.25 mg, 0.25 mg, Intravenous,  Once, 4 of 4 cycles. Administration: 0.25 mg (05/29/2019), 0.25 mg (06/19/2019), 0.25 mg (07/10/2019), 0.25 mg (07/31/2019)  pegfilgrastim (NEULASTA ONPRO KIT) injection 6 mg, 6 mg,  Subcutaneous, Once, 4 of 4 cycles. Administration: 6 mg (05/29/2019), 6 mg (06/19/2019), 6 mg (07/10/2019), 6 mg (07/31/2019)  cyclophosphamide (CYTOXAN) 960 mg in sodium chloride 0.9 % 250 mL chemo infusion, 600 mg/m2 = 960 mg, Intravenous,  Once, 4 of 4 cycles. Administration: 960 mg (05/29/2019), 960 mg (06/19/2019), 960 mg (07/10/2019), 960 mg (07/31/2019)  DOCEtaxel (TAXOTERE) 120 mg in sodium chloride 0.9 % 250 mL chemo infusion, 75 mg/m2 = 120 mg, Intravenous,  Once, 4 of 4 cycles. Administration: 120 mg (05/29/2019), 120 mg (06/19/2019), 120 mg (07/10/2019), 120 mg (07/31/2019)   08/27/2019 - 09/21/2019 Radiation Therapy   The patient initially received a dose of 42.56 Gy in 16 fractions to the breast using whole-breast tangent fields. This was delivered using a 3-D conformal technique. The patient then received a boost to the seroma. This delivered an additional 8 Gy in 4 fractions using an en face electron field due to the depth of the seroma. The total dose was 50.56 Gy.     INTERVAL HISTORY:  Joanne Hansen to review her survivorship care plan detailing her treatment course for breast cancer, as well as monitoring long-term side effects of that treatment, education regarding health maintenance, screening, and overall wellness and health promotion.     Overall, Joanne Hansen reports feeling quite well.  She completed the survivorship survey (scanned in system), and noted that she has some mild fatigue, some swelling in her right breast, and some pain in her hip and back that she is seeing ortho for.  She is exercising regularly, eating plenty of fruits and vegetables, and is up to date with her cancer screenings.    REVIEW OF SYSTEMS:  Review of Systems  Constitutional: Negative for appetite change, chills, fatigue, fever and unexpected weight  change.  HENT:   Negative for hearing loss, lump/mass and trouble swallowing.   Eyes: Negative for eye problems and icterus.  Respiratory: Negative for chest  tightness, cough and shortness of breath.   Cardiovascular: Negative for chest pain, leg swelling and palpitations.  Gastrointestinal: Negative for abdominal distention, abdominal pain, constipation, diarrhea, nausea, rectal pain and vomiting.  Endocrine: Negative for hot flashes.  Genitourinary: Negative for difficulty urinating.   Musculoskeletal: Negative for arthralgias.  Skin: Negative for itching and rash.  Neurological: Negative for dizziness, extremity weakness, headaches and numbness.  Hematological: Negative for adenopathy. Does not bruise/bleed easily.  Psychiatric/Behavioral: Negative for depression. The patient is not nervous/anxious.   Breast: Denies any new nodularity, masses, tenderness, nipple changes, or nipple discharge.      ONCOLOGY TREATMENT TEAM:  1. Surgeon:  Dr. Brantley Stage at [facility] 2. Medical Oncologist: Dr. Jana Hakim  3. Radiation Oncologist: Dr. Lisbeth Renshaw    PAST MEDICAL/SURGICAL HISTORY:  Past Medical History:  Diagnosis Date  . Arthritis   . Family history of brain cancer   . Family history of breast cancer   . Family history of colon cancer   . Fecal incontinence    05-16-2019 per pt only a little leakage but controllable (was in clinical trial for [facility] without benefit.)  . Frequent headaches   . History of syncope    pre-syncope  08/ 2015  per pt no issue with this since   . Hyperlipidemia   . Malignant neoplasm of upper-inner quadrant of right breast in female, estrogen receptor negative [facility]) oncologist-- dr Jana Hakim  dr moody   dx 08/ 2020--  invasive ductal carcinoma,  05-13-2019  s/p right breast lumpectomy   . PAF (paroxysmal atrial fibrillation) ([place]) (05-16-2019   pt from [place], recently moved to [place] to be near daughter, has not established a cardiology yet--  previously seen by dr Lowella Dandy cossu (last office note dated 02-28-2018 scanned in epic)---  echo 04-15-2014  ef 55-60%, G1DD mild AR without stenosis, RVSp 52mHg  .  Rectal cyst   . Thyroid goiter    pt ultrasound in epic 06-04-2014 , nodules, no bx done  . Urinary incontinence, mixed    Past Surgical History:  Procedure Laterality Date  . ABDOMINAL HYSTERECTOMY  1978   w/  RSO  and APPENDECTOMY  . BREAST LUMPECTOMY WITH RADIOACTIVE SEED AND SENTINEL LYMPH NODE BIOPSY Right 05/15/2019   Procedure: RIGHT BREAST LUMPECTOMY WITH RADIOACTIVE SEED AND SENTINEL LYMPH NODE MAPPING;  Surgeon: CErroll Luna MD;  [facility]  Service: General;  Laterality: Right;  . BREAST SURGERY  2011   biospy of right breast  . CATARACT EXTRACTION W/ INTRAOCULAR LENS  IMPLANT, BILATERAL  2004  . COLONOSCOPY  11/21/2019  . FLEXIBLE SIGMOIDOSCOPY N/A 05/18/2019   Procedure: FLEXIBLE SIGMOIDOSCOPY,  TRANSANAL EXCISION inclusion cyst;  Surgeon: TLeighton Ruff MD;  [facility]  Service: General;  Laterality: N/A;  . gluteus medius repair  2017  . LUMBAR LAMINECTOMY  2013   L5 -- S1  . PORT-A-CATH REMOVAL Right 09/27/2019   Procedure: PORT REMOVAL;  Surgeon: CErroll Luna MD;  [facility]  Service: General;  Laterality: Right;  . PORTACATH PLACEMENT Right 05/15/2019   Procedure: INSERTION PORT-A-CATH WITH ULTRASOUND;  Surgeon: CErroll Luna MD;  [facility]  Service: General;  Laterality: Right;  . ROTATOR CUFF REPAIR Left 2001     ALLERGIES:  Allergies  Allergen Reactions  .  Penicillins Swelling  . Sulfa Antibiotics Nausea And Vomiting     CURRENT MEDICATIONS:  Outpatient Encounter Medications as of 12/20/2019  Medication Sig  . Calcium-Phosphorus-Vitamin D (CITRACAL CALCIUM GUMMIES PO) Take 2 each by mouth daily.  . carboxymethylcellulose (REFRESH PLUS) 0.5 % SOLN 1 drop 3 (three) times daily as needed.  . Cyanocobalamin (VITAMIN B 12 PO) Take 1,000 mg by mouth daily. sublingal  . Doxepin HCl, Antipruritic, (DOXEPIN HCL EX) Apply topically. Compounded with  naltrexone/pramoxine/lidocaine  . hydrocortisone 2.5 % ointment Apply topically 2 (two) times daily.  . Lidocaine 5 % CREA Apply topically. Compounded with doxepin/pramoxine/naltrexone  . methylcellulose oral powder Take by mouth every evening.   . metoprolol tartrate (LOPRESSOR) 25 MG tablet TAKE 1/2 (ONE-HALF) TABLET BY MOUTH TWICE DAILY  . Naltrexone POWD by Does not apply route. Compounded with doxepin/pramoxine/lidocaine  . Pramoxine-HC 1-1 % OINT Apply topically. Compounded with doxepin/naltrexone/lidocaine  . simvastatin (ZOCOR) 40 MG tablet Take 1 tablet by mouth once daily   Facility-Administered Encounter Medications as of 12/20/2019  Medication  . 0.9 %  sodium chloride infusion     ONCOLOGIC FAMILY HISTORY:  Family History  Problem Relation Age of Onset  . COPD Mother   . Cancer Mother 62       colon  . Arthritis Mother   . Hypertension Mother   . Miscarriages / [place] Mother   . Stroke Mother 75  . Alzheimer's disease Mother 13  . Heart disease Father   . Heart attack Father 44  . Lung disease Father   . Arthritis Sister   . Breast cancer Sister 26       Lumpectomy  . Diabetes Maternal Grandmother   . Breast cancer Niece 33       Louise's Daughter  . Cervical cancer Niece 46     GENETIC COUNSELING/TESTING: Not at this time  SOCIAL HISTORY:  Social History   Socioeconomic History  . Marital status: Married    Spouse name: Not on file  . Number of children: Not on file  . Years of education: Not on file  . Highest education level: Not on file  Occupational History  . Not on file  Tobacco Use  . Smoking status: Never Smoker  . Smokeless tobacco: Never Used  Substance and Sexual Activity  . Alcohol use: Yes    Comment: Cocktails-3 days per week  . Drug use: Never    Types: Amphetamines  . Sexual activity: Not on file  Other Topics Concern  . Not on file  Social History Narrative  . Not on file   Social Determinants of Health    Financial Resource Strain:   . Difficulty of Paying Living Expenses:   Food Insecurity:   . Worried About Charity fundraiser in the Last Year:   . Arboriculturist in the Last Year:   Transportation Needs: No Transportation Needs  . Lack of Transportation (Medical): No  . Lack of Transportation (Non-Medical): No  Physical Activity:   . Days of Exercise per Week:   . Minutes of Exercise per Session:   Stress:   . Feeling of Stress :   Social Connections:   . Frequency of Communication with Friends and Family:   . Frequency of Social Gatherings with Friends and Family:   . Attends Religious Services:   . Active Member of Clubs or Organizations:   . Attends Archivist Meetings:   Marland Kitchen Marital Status:   Intimate Partner Violence:   .  Fear of Current or Ex-Partner:   . Emotionally Abused:   Marland Kitchen Physically Abused:   . Sexually Abused:      OBSERVATIONS/OBJECTIVE:   BP 113/76 (BP Location: Left Arm, Patient Position: Sitting)   Pulse 73   Temp 98.9 F (37.2 C) (Temporal)   Resp 18   Ht 5' 2"  (1.575 m)   Wt 131 lb 3.2 oz (59.5 kg)   SpO2 100%   BMI 24.00 kg/m  GENERAL: Patient is a well appearing female in no acute distress HEENT:  Sclerae anicteric.  Oropharynx clear and moist. No ulcerations or evidence of oropharyngeal candidiasis. Neck is supple.  NODES:  No cervical, supraclavicular, or axillary lymphadenopathy palpated.  BREAST EXAM:  Right breast s/p lumpectomy and radiation, mild swelling, no sign of recurrence, left breast benign LUNGS:  Clear to auscultation bilaterally.  No wheezes or rhonchi. HEART:  Regular rate and rhythm. No murmur appreciated. ABDOMEN:  Soft, nontender.  Positive, normoactive bowel sounds. No organomegaly palpated. MSK:  No focal spinal tenderness to palpation. Full range of motion bilaterally in the upper extremities. EXTREMITIES:  No peripheral edema.   SKIN:  Clear with no obvious rashes or skin changes. No nail dyscrasia. NEURO:   Nonfocal. Well oriented.  Appropriate affect.    LABORATORY DATA:  None for this visit.  DIAGNOSTIC IMAGING:  None for this visit.      ASSESSMENT AND PLAN:  Ms.. Hansen is a pleasant 74 y.o. female with Stage IB right breast invasive ductal carcinoma, ER-/PR-/HER2-, diagnosed in 04/2019, treated with lumpectomy, adjuvant chemotherapy, and adjuvant radiation therapy.  She presents to the Survivorship Clinic for our initial meeting and routine follow-up post-completion of treatment for breast cancer.    1. Stage IB right breast cancer:  Ms. Orrick is continuing to recover from definitive treatment for breast cancer. She will follow-up with her medical oncologist, Dr. Jana Hakim in 04/2020 with history and physical exam per surveillance protocol.   Her mammogram is due 03/2020 Today, a comprehensive survivorship care plan and treatment summary was reviewed with the patient today detailing her breast cancer diagnosis, treatment course, potential late/long-term effects of treatment, appropriate follow-up care with recommendations for the future, and patient education resources.  A copy of this summary, along with a letter will be sent to the patient's primary care provider via mail/fax/In Basket message after today's visit.    2. Bone health:   She was given education on specific activities to promote bone health.  3. Cancer screening:  Due to Ms. Emry history and her age, she should receive screening for skin cancers, colon cancer, and gynecologic cancers.  The information and recommendations are listed on the patient's comprehensive care plan/treatment summary and were reviewed in detail with the patient.    4. Health maintenance and wellness promotion: Ms. Like was encouraged to consume 5-7 servings of fruits and vegetables per day. We reviewed the "Nutrition Rainbow" handout, as well as the handout "Take Control of Your Health and Reduce Your Cancer Risk" from the [employer].   She was also encouraged to engage in moderate to vigorous exercise for 30 minutes per day most days of the week. We discussed the [employer] fitness program, which is designed for cancer survivors to help them become more physically fit after cancer treatments.  She was instructed to limit her alcohol consumption and continue to abstain from tobacco use.     5. Support services/counseling: It is not uncommon for this period of the patient's cancer  care trajectory to be one of many emotions and stressors.  We discussed how this can be increasingly difficult during the times of quarantine and social distancing due to the COVID-19 pandemic.   She was given information regarding our available services and encouraged to contact me with any questions or for help enrolling in any of our support group/programs.    Follow up instructions:    -Return to cancer center in 04/2020 for f/u with Dr. Jana Hakim  -Mammogram due in 03/2020 -I will see her back in 10/2020 She was recommended to continue with the appropriate pandemic precautions.  She knows to call for any questions that may arise between now and her next appointment.  We are happy to see her sooner if needed.  Total encounter time: 45 minutes*  Wilber Bihari, NP 12/21/19 4:18 PM Medical Oncology and Hematology [facility] [place], [place] [address] Tel. [phone]    Fax. [phone]  *Total Encounter Time as defined by the Centers for Medicare and Medicaid Services includes, in addition to the face-to-face time of a patient visit (documented in the note above) non-face-to-face time: obtaining and reviewing outside history, ordering and reviewing medications, tests or procedures, care coordination (communications with other health care professionals or caregivers) and documentation in the medical record.

## 2019-12-20 ENCOUNTER — Other Ambulatory Visit: Payer: Self-pay

## 2019-12-20 ENCOUNTER — Telehealth: Payer: Self-pay

## 2019-12-20 ENCOUNTER — Inpatient Hospital Stay: Payer: Medicare Other | Attending: Oncology

## 2019-12-20 ENCOUNTER — Encounter: Payer: Self-pay | Admitting: Adult Health

## 2019-12-20 ENCOUNTER — Inpatient Hospital Stay (HOSPITAL_BASED_OUTPATIENT_CLINIC_OR_DEPARTMENT_OTHER): Payer: Medicare Other | Admitting: Adult Health

## 2019-12-20 ENCOUNTER — Inpatient Hospital Stay: Payer: Medicare Other

## 2019-12-20 ENCOUNTER — Inpatient Hospital Stay: Payer: Medicare Other | Admitting: Adult Health

## 2019-12-20 VITALS — BP 113/76 | HR 73 | Temp 98.9°F | Resp 18 | Ht 62.0 in | Wt 131.2 lb

## 2019-12-20 DIAGNOSIS — E2839 Other primary ovarian failure: Secondary | ICD-10-CM

## 2019-12-20 DIAGNOSIS — Z9221 Personal history of antineoplastic chemotherapy: Secondary | ICD-10-CM | POA: Diagnosis not present

## 2019-12-20 DIAGNOSIS — N6310 Unspecified lump in the right breast, unspecified quadrant: Secondary | ICD-10-CM | POA: Insufficient documentation

## 2019-12-20 DIAGNOSIS — R600 Localized edema: Secondary | ICD-10-CM

## 2019-12-20 DIAGNOSIS — C50211 Malignant neoplasm of upper-inner quadrant of right female breast: Secondary | ICD-10-CM | POA: Diagnosis present

## 2019-12-20 DIAGNOSIS — D539 Nutritional anemia, unspecified: Secondary | ICD-10-CM

## 2019-12-20 DIAGNOSIS — R5383 Other fatigue: Secondary | ICD-10-CM | POA: Diagnosis not present

## 2019-12-20 DIAGNOSIS — N393 Stress incontinence (female) (male): Secondary | ICD-10-CM

## 2019-12-20 DIAGNOSIS — M25559 Pain in unspecified hip: Secondary | ICD-10-CM | POA: Insufficient documentation

## 2019-12-20 DIAGNOSIS — I480 Paroxysmal atrial fibrillation: Secondary | ICD-10-CM

## 2019-12-20 DIAGNOSIS — Z923 Personal history of irradiation: Secondary | ICD-10-CM | POA: Diagnosis not present

## 2019-12-20 DIAGNOSIS — Z171 Estrogen receptor negative status [ER-]: Secondary | ICD-10-CM | POA: Diagnosis not present

## 2019-12-20 DIAGNOSIS — E782 Mixed hyperlipidemia: Secondary | ICD-10-CM

## 2019-12-20 LAB — COMPREHENSIVE METABOLIC PANEL
ALT: 16 U/L (ref 0–44)
AST: 19 U/L (ref 15–41)
Albumin: 4.1 g/dL (ref 3.5–5.0)
Alkaline Phosphatase: 83 U/L (ref 38–126)
Anion gap: 8 (ref 5–15)
BUN: 18 mg/dL (ref 8–23)
CO2: 26 mmol/L (ref 22–32)
Calcium: 9.3 mg/dL (ref 8.9–10.3)
Chloride: 108 mmol/L (ref 98–111)
Creatinine, Ser: 0.76 mg/dL (ref 0.44–1.00)
GFR calc Af Amer: >60 mL/min (ref >60)
GFR calc non Af Amer: >60 mL/min (ref >60)
Glucose, Bld: 91 mg/dL (ref 70–99)
Potassium: 3.9 mmol/L (ref 3.5–5.1)
Sodium: 142 mmol/L (ref 135–145)
Total Bilirubin: 0.8 mg/dL (ref 0.3–1.2)
Total Protein: 6.9 g/dL (ref 6.5–8.1)

## 2019-12-20 LAB — CBC WITH DIFFERENTIAL/PLATELET
Abs Immature Granulocytes: 0.01 10*3/uL (ref 0.00–0.07)
Basophils Absolute: 0 10*3/uL (ref 0.0–0.1)
Basophils Relative: 1 %
Eosinophils Absolute: 0.1 10*3/uL (ref 0.0–0.5)
Eosinophils Relative: 2 %
HCT: 39.2 % (ref 36.0–46.0)
Hemoglobin: 13.1 g/dL (ref 12.0–15.0)
Immature Granulocytes: 0 %
Lymphocytes Relative: 21 %
Lymphs Abs: 1.2 10*3/uL (ref 0.7–4.0)
MCH: 32.7 pg (ref 26.0–34.0)
MCHC: 33.4 g/dL (ref 30.0–36.0)
MCV: 97.8 fL (ref 80.0–100.0)
Monocytes Absolute: 0.6 10*3/uL (ref 0.1–1.0)
Monocytes Relative: 10 %
Neutro Abs: 3.8 10*3/uL (ref 1.7–7.7)
Neutrophils Relative %: 66 %
Platelets: 201 10*3/uL (ref 150–400)
RBC: 4.01 MIL/uL (ref 3.87–5.11)
RDW: 13.2 % (ref 11.5–15.5)
WBC: 5.8 10*3/uL (ref 4.0–10.5)
nRBC: 0 % (ref 0.0–0.2)

## 2019-12-20 LAB — IRON AND TIBC
Iron: 117 ug/dL (ref 41–142)
Saturation Ratios: 39 % (ref 21–57)
TIBC: 298 ug/dL (ref 236–444)
UIBC: 180 ug/dL (ref 120–384)

## 2019-12-20 LAB — FOLATE: Folate: 12.3 ng/mL (ref >5.9)

## 2019-12-20 LAB — FERRITIN: Ferritin: 92 ng/mL (ref 11–307)

## 2019-12-20 LAB — RETICULOCYTES
Immature Retic Fract: 11.1 % (ref 2.3–15.9)
RBC.: 4.05 MIL/uL (ref 3.87–5.11)
Retic Count, Absolute: 76.5 10*3/uL (ref 19.0–186.0)
Retic Ct Pct: 1.9 % (ref 0.4–3.1)

## 2019-12-20 LAB — VITAMIN B12: Vitamin B-12: 790 pg/mL (ref 180–914)

## 2019-12-20 NOTE — Telephone Encounter (Signed)
TC to Pt per Suzan Garibaldi NP to confirm date and time of Mammogram and Bone density appointment at the [facility]. Spoke with Pt confirmed appointments and informed Pt not to take calcium supplement 48 hours before bone density. Pt. Verbalized understanding.

## 2019-12-21 ENCOUNTER — Telehealth: Payer: Self-pay | Admitting: Adult Health

## 2019-12-21 NOTE — Telephone Encounter (Signed)
No 4/29 los. No changes made to pt's schedule.

## 2020-01-24 ENCOUNTER — Other Ambulatory Visit: Payer: Self-pay | Admitting: Family Medicine

## 2020-02-27 ENCOUNTER — Other Ambulatory Visit: Payer: Self-pay

## 2020-02-27 ENCOUNTER — Encounter: Payer: Self-pay | Admitting: Internal Medicine

## 2020-02-27 ENCOUNTER — Ambulatory Visit (INDEPENDENT_AMBULATORY_CARE_PROVIDER_SITE_OTHER): Payer: Medicare Other | Admitting: Internal Medicine

## 2020-02-27 VITALS — BP 128/74 | HR 75 | Temp 97.9°F | Ht 62.0 in | Wt 123.6 lb

## 2020-02-27 DIAGNOSIS — L299 Pruritus, unspecified: Secondary | ICD-10-CM | POA: Diagnosis not present

## 2020-02-27 DIAGNOSIS — R350 Frequency of micturition: Secondary | ICD-10-CM | POA: Diagnosis not present

## 2020-02-27 DIAGNOSIS — R3989 Other symptoms and signs involving the genitourinary system: Secondary | ICD-10-CM

## 2020-02-27 LAB — POCT URINALYSIS DIPSTICK
Bilirubin, UA: NEGATIVE
Blood, UA: POSITIVE
Glucose, UA: NEGATIVE
Ketones, UA: NEGATIVE
Nitrite, UA: NEGATIVE
Protein, UA: NEGATIVE
Spec Grav, UA: 1.01 (ref 1.010–1.025)
Urobilinogen, UA: 0.2 E.U./dL
pH, UA: 7 (ref 5.0–8.0)

## 2020-02-27 MED ORDER — NITROFURANTOIN MONOHYD MACRO 100 MG PO CAPS
100.0000 mg | ORAL_CAPSULE | Freq: Two times a day (BID) | ORAL | 0 refills | Status: AC
Start: 2020-02-27 — End: 2020-03-03

## 2020-02-27 NOTE — Progress Notes (Signed)
Chief Complaint  Patient presents with  . Urinary Frequency    Not able to void much, painful urination, strong odor, started about 3 days ago    HPI: Joanne Hansen 74 y.o. come in for SDA  PCP appt NA today   sx x 3 days urgency and pressure over  Bladder.  No fever chills flank pain  No meds  Under care for breast cancer  Dx 9 20  1B right  Increase fluids  And cramberry    ROS: See pertinent positives and negatives per HPI. Ever since shingles has had itching when walks gets hot  Or exercises without  Hives or resp sx when cools off goes away . No hx of same and no  Hives  No new meds  Past Medical History:  Diagnosis Date  . Arthritis   . Family history of brain cancer   . Family history of breast cancer   . Family history of colon cancer   . Fecal incontinence    05-16-2019 per pt only a little leakage but controllable (was in clinical trial for [facility] without benefit.)  . Frequent headaches   . History of syncope    pre-syncope  08/ 2015  per pt no issue with this since   . Hyperlipidemia   . Malignant neoplasm of upper-inner quadrant of right breast in female, estrogen receptor negative [facility]) oncologist-- dr Jana Hakim  dr moody   dx 08/ 2020--  invasive ductal carcinoma,  05-13-2019  s/p right breast lumpectomy   . PAF (paroxysmal atrial fibrillation) ([place]) (05-16-2019   pt from [place], recently moved to [place] to be near daughter, has not established a cardiology yet--  previously seen by dr Lowella Dandy cossu (last office note dated 02-28-2018 scanned in epic)---  echo 04-15-2014  ef 55-60%, G1DD mild AR without stenosis, RVSp 32mmHg  . Rectal cyst   . Thyroid goiter    pt ultrasound in epic 06-04-2014 , nodules, no bx done  . Urinary incontinence, mixed     Family History  Problem Relation Age of Onset  . COPD Mother   . Cancer Mother 76       colon  . Arthritis Mother   . Hypertension Mother   . Miscarriages / [place] Mother   . Stroke Mother 36  .  Alzheimer's disease Mother 54  . Heart disease Father   . Heart attack Father 59  . Lung disease Father   . Arthritis Sister   . Breast cancer Sister 28       Lumpectomy  . Diabetes Maternal Grandmother   . Breast cancer Niece 15       Louise's Daughter  . Cervical cancer Niece 61    Social History   Socioeconomic History  . Marital status: Married    Spouse name: Not on file  . Number of children: Not on file  . Years of education: Not on file  . Highest education level: Not on file  Occupational History  . Not on file  Tobacco Use  . Smoking status: Never Smoker  . Smokeless tobacco: Never Used  Vaping Use  . Vaping Use: Never used  Substance and Sexual Activity  . Alcohol use: Yes    Comment: Cocktails-3 days per week  . Drug use: Never    Types: Amphetamines  . Sexual activity: Not on file  Other Topics Concern  . Not on file  Social History Narrative  . Not on file   Social Determinants of  Health   Financial Resource Strain:   . Difficulty of Paying Living Expenses:   Food Insecurity:   . Worried About Charity fundraiser in the Last Year:   . Arboriculturist in the Last Year:   Transportation Needs: No Transportation Needs  . Lack of Transportation (Medical): No  . Lack of Transportation (Non-Medical): No  Physical Activity:   . Days of Exercise per Week:   . Minutes of Exercise per Session:   Stress:   . Feeling of Stress :   Social Connections:   . Frequency of Communication with Friends and Family:   . Frequency of Social Gatherings with Friends and Family:   . Attends Religious Services:   . Active Member of Clubs or Organizations:   . Attends Archivist Meetings:   Marland Kitchen Marital Status:     Outpatient Medications Prior to Visit  Medication Sig Dispense Refill  . CALCIUM-VITAMIN D PO Take 2 capsules by mouth daily. Vit D 1000 with minerals    . carboxymethylcellulose (REFRESH PLUS) 0.5 % SOLN 1 drop 3 (three) times daily as needed.      . Cyanocobalamin (VITAMIN B 12 PO) Take 1,000 mg by mouth daily. sublingal    . Doxepin HCl, Antipruritic, (DOXEPIN HCL EX) Apply topically. Compounded with naltrexone/pramoxine/lidocaine    . methylcellulose oral powder Take by mouth every evening.     . metoprolol tartrate (LOPRESSOR) 25 MG tablet TAKE 1/2 (ONE-HALF) TABLET BY MOUTH TWICE DAILY 90 tablet 0  . Naltrexone POWD by Does not apply route. Compounded with doxepin/pramoxine/lidocaine    . Pramoxine-HC 1-1 % OINT Apply topically. Compounded with doxepin/naltrexone/lidocaine    . prednisoLONE acetate (PRED FORTE) 1 % ophthalmic suspension     . simvastatin (ZOCOR) 40 MG tablet Take 1 tablet by mouth once daily 90 tablet 0  . Calcium-Phosphorus-Vitamin D (CITRACAL CALCIUM GUMMIES PO) Take 2 each by mouth daily. (Patient not taking: Reported on 02/27/2020)    . hydrocortisone 2.5 % ointment Apply topically 2 (two) times daily. (Patient not taking: Reported on 02/27/2020)    . Lidocaine 5 % CREA Apply topically. Compounded with doxepin/pramoxine/naltrexone (Patient not taking: Reported on 02/27/2020)     Facility-Administered Medications Prior to Visit  Medication Dose Route Frequency Provider Last Rate Last Admin  . 0.9 %  sodium chloride infusion  500 mL Intravenous Once [place], MD         EXAM:  BP 128/74   Pulse 75   Temp 97.9 F (36.6 C) (Temporal)   Ht 5\' 2"  (1.575 m)   Wt 123 lb 9.6 oz (56.1 kg)   SpO2 98%   BMI 22.61 kg/m   Body mass index is 22.61 kg/m.  GENERAL: vitals reviewed and listed above, alert, oriented, appears well hydrated and in no acute distress HEENT: atraumatic, conjunctiva  clear, no obvious abnormalities on inspection of external nose and ears OP : masked  NECK: no obvious masses on inspection palpation  LUNGS: clear to auscultation bilaterally, no wheezes, rales or rhonchi, good air movement Abdomen:  Sof,t normal bowel sounds without hepatosplenomegaly, no guarding rebound or masses no  CVA tenderness CV: HRRR, no clubbing cyanosis or  peripheral edema nl cap refill  MS: moves all extremities without noticeable focal  abnormality PSYCH: pleasant and cooperative, no obvious depression or anxiety Lab Results  Component Value Date   WBC 5.8 12/20/2019   HGB 13.1 12/20/2019   HCT 39.2 12/20/2019   PLT 201 12/20/2019  GLUCOSE 91 12/20/2019   CHOL 159 07/02/2019   TRIG 130.0 07/02/2019   HDL 54.20 07/02/2019   LDLCALC 79 07/02/2019   ALT 16 12/20/2019   AST 19 12/20/2019   NA 142 12/20/2019   K 3.9 12/20/2019   CL 108 12/20/2019   CREATININE 0.76 12/20/2019   BUN 18 12/20/2019   CO2 26 12/20/2019   TSH 1.06 07/02/2019   HGBA1C 5.6 07/02/2019   BP Readings from Last 3 Encounters:  02/27/20 128/74  12/20/19 113/76  12/14/19 120/74   Urinalysis    Component Value Date/Time   BILIRUBINUR Negative 02/27/2020 1123   PROTEINUR Negative 02/27/2020 1123   UROBILINOGEN 0.2 02/27/2020 1123   NITRITE Negative 02/27/2020 1123   LEUKOCYTESUR Trace (A) 02/27/2020 1123     ASSESSMENT AND PLAN:  Discussed the following assessment and plan:  Suspected UTI  Urinary frequency - Plan: POCT urinalysis dipstick, Culture, Urine, Culture, Urine, CANCELED: Culture, Urine, CANCELED: Culture, Urine  Itching - when hot exrcising  no hives  no new meds? Cant take pcn or sulfa  Trial macrobid ucx pending  -Patient advised to return or notify health care team  if  new concerns arise.  Patient Instructions  Antibiotic for 5 days .   Will let you know when culture results back Can try claritin antihistamine  Pre exercise  And talk with oncologist  About the itching     Standley Brooking. Sheletha Bow M.D.

## 2020-02-27 NOTE — Patient Instructions (Signed)
Antibiotic for 5 days .   Will let you know when culture results back Can try claritin antihistamine  Pre exercise  And talk with oncologist  About the itching

## 2020-02-29 LAB — URINE CULTURE
MICRO NUMBER:: 10675693
SPECIMEN QUALITY:: ADEQUATE

## 2020-02-29 NOTE — Progress Notes (Signed)
Tell patient that urine culture shows e coli  sensitive to medication given . Should resolve with current treatment .FU if not better. 

## 2020-03-03 ENCOUNTER — Other Ambulatory Visit: Payer: Self-pay | Admitting: Family Medicine

## 2020-04-10 ENCOUNTER — Other Ambulatory Visit: Payer: Self-pay

## 2020-04-10 ENCOUNTER — Ambulatory Visit
Admission: RE | Admit: 2020-04-10 | Discharge: 2020-04-10 | Disposition: A | Discharge status: Home / Self Care | Payer: Medicare Other | Source: Ambulatory Visit | Attending: Oncology | Admitting: Oncology

## 2020-04-10 ENCOUNTER — Ambulatory Visit
Admission: RE | Admit: 2020-04-10 | Discharge: 2020-04-10 | Disposition: A | Discharge status: Home / Self Care | Payer: Medicare Other | Source: Ambulatory Visit | Attending: Adult Health | Admitting: Adult Health

## 2020-04-10 DIAGNOSIS — D539 Nutritional anemia, unspecified: Secondary | ICD-10-CM

## 2020-04-10 DIAGNOSIS — R600 Localized edema: Secondary | ICD-10-CM

## 2020-04-10 DIAGNOSIS — I480 Paroxysmal atrial fibrillation: Secondary | ICD-10-CM

## 2020-04-10 DIAGNOSIS — N393 Stress incontinence (female) (male): Secondary | ICD-10-CM

## 2020-04-10 DIAGNOSIS — C50211 Malignant neoplasm of upper-inner quadrant of right female breast: Secondary | ICD-10-CM

## 2020-04-10 DIAGNOSIS — E782 Mixed hyperlipidemia: Secondary | ICD-10-CM

## 2020-04-10 DIAGNOSIS — E2839 Other primary ovarian failure: Secondary | ICD-10-CM

## 2020-04-10 HISTORY — DX: Personal history of irradiation: Z92.3

## 2020-04-15 ENCOUNTER — Other Ambulatory Visit: Payer: Self-pay | Admitting: Family Medicine

## 2020-04-15 ENCOUNTER — Telehealth: Payer: Self-pay

## 2020-04-15 NOTE — Telephone Encounter (Signed)
Called and given below message. She verbalized understanding. 

## 2020-04-15 NOTE — Telephone Encounter (Signed)
-----   Message from Gardenia Phlegm, NP sent at 04/10/2020  3:34 PM EDT ----- Please call patient.  Bone density shows osteopenia, mild to moderatel.  Recommend calcium, vitamin d, weight bearing exercise ----- Message ----- From: Interface, Rad Results In Sent: 04/10/2020   2:42 PM EDT To: Gardenia Phlegm, NP

## 2020-05-21 ENCOUNTER — Inpatient Hospital Stay (HOSPITAL_BASED_OUTPATIENT_CLINIC_OR_DEPARTMENT_OTHER): Payer: Medicare Other | Admitting: Oncology

## 2020-05-21 ENCOUNTER — Inpatient Hospital Stay: Payer: Medicare Other | Attending: Oncology

## 2020-05-21 ENCOUNTER — Other Ambulatory Visit: Payer: Self-pay

## 2020-05-21 ENCOUNTER — Telehealth: Payer: Self-pay | Admitting: Oncology

## 2020-05-21 VITALS — BP 116/63 | HR 60 | Temp 97.8°F | Resp 17 | Ht 62.0 in | Wt 121.8 lb

## 2020-05-21 DIAGNOSIS — Z803 Family history of malignant neoplasm of breast: Secondary | ICD-10-CM | POA: Insufficient documentation

## 2020-05-21 DIAGNOSIS — C50211 Malignant neoplasm of upper-inner quadrant of right female breast: Secondary | ICD-10-CM

## 2020-05-21 DIAGNOSIS — D539 Nutritional anemia, unspecified: Secondary | ICD-10-CM | POA: Insufficient documentation

## 2020-05-21 DIAGNOSIS — Z79899 Other long term (current) drug therapy: Secondary | ICD-10-CM | POA: Insufficient documentation

## 2020-05-21 DIAGNOSIS — C50911 Malignant neoplasm of unspecified site of right female breast: Secondary | ICD-10-CM | POA: Diagnosis not present

## 2020-05-21 DIAGNOSIS — R4189 Other symptoms and signs involving cognitive functions and awareness: Secondary | ICD-10-CM | POA: Diagnosis not present

## 2020-05-21 DIAGNOSIS — Z171 Estrogen receptor negative status [ER-]: Secondary | ICD-10-CM | POA: Diagnosis not present

## 2020-05-21 DIAGNOSIS — Z923 Personal history of irradiation: Secondary | ICD-10-CM | POA: Insufficient documentation

## 2020-05-21 LAB — CBC WITH DIFFERENTIAL/PLATELET
Abs Immature Granulocytes: 0.01 10*3/uL (ref 0.00–0.07)
Basophils Absolute: 0.1 10*3/uL (ref 0.0–0.1)
Basophils Relative: 1 %
Eosinophils Absolute: 0.1 10*3/uL (ref 0.0–0.5)
Eosinophils Relative: 2 %
HCT: 39.4 % (ref 36.0–46.0)
Hemoglobin: 13 g/dL (ref 12.0–15.0)
Immature Granulocytes: 0 %
Lymphocytes Relative: 24 %
Lymphs Abs: 1.4 10*3/uL (ref 0.7–4.0)
MCH: 33.1 pg (ref 26.0–34.0)
MCHC: 33 g/dL (ref 30.0–36.0)
MCV: 100.3 fL — ABNORMAL HIGH (ref 80.0–100.0)
Monocytes Absolute: 0.6 10*3/uL (ref 0.1–1.0)
Monocytes Relative: 10 %
Neutro Abs: 3.7 10*3/uL (ref 1.7–7.7)
Neutrophils Relative %: 63 %
Platelets: 203 10*3/uL (ref 150–400)
RBC: 3.93 MIL/uL (ref 3.87–5.11)
RDW: 12.2 % (ref 11.5–15.5)
WBC: 5.8 10*3/uL (ref 4.0–10.5)
nRBC: 0 % (ref 0.0–0.2)

## 2020-05-21 LAB — COMPREHENSIVE METABOLIC PANEL
ALT: 18 U/L (ref 0–44)
AST: 19 U/L (ref 15–41)
Albumin: 4 g/dL (ref 3.5–5.0)
Alkaline Phosphatase: 63 U/L (ref 38–126)
Anion gap: 8 (ref 5–15)
BUN: 15 mg/dL (ref 8–23)
CO2: 27 mmol/L (ref 22–32)
Calcium: 9.7 mg/dL (ref 8.9–10.3)
Chloride: 104 mmol/L (ref 98–111)
Creatinine, Ser: 0.73 mg/dL (ref 0.44–1.00)
GFR calc Af Amer: >60 mL/min (ref >60)
GFR calc non Af Amer: >60 mL/min (ref >60)
Glucose, Bld: 93 mg/dL (ref 70–99)
Potassium: 4.3 mmol/L (ref 3.5–5.1)
Sodium: 139 mmol/L (ref 135–145)
Total Bilirubin: 0.6 mg/dL (ref 0.3–1.2)
Total Protein: 7 g/dL (ref 6.5–8.1)

## 2020-05-21 NOTE — Progress Notes (Signed)
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 She is now under observation.  Since her last visit, she underwent port removal on 09/27/2019.   She also underwent bilateral diagnostic mammography with tomography at [facility] on 04/10/2020 showing: breast density category B; no evidence of malignancy in either breast.  She underwent bone density screening the same day showing a T-score of -1.9, which is considered osteopenic.  Finally, she also underwent colonoscopy on 11/21/2019 under Dr. Tarri Glenn. Pathology from the procedure ([id number]) was benign.   REVIEW OF SYSTEMS: Joanne Hansen is having significant back problems.  She is seeing Dr. Nelva Bush.  She has lost 14 pounds thanks to exercise and diet and is hoping not to ever have to have back surgery.  She received the Pfizer vaccine x2 and did fine with that.  She is planning to receive a booster shot.  She is very concerned that her mammogram was read by only one physician although she understands that is the standard of care.  She would like it to be read by 2 physicians.  She had a history of shingles on her face.  Occasionally when she begins to sweat she itches all over and she  wonders if that is due to chemotherapy or if that is due to shingles.  She also has significant scar tissue from her prior surgery and radiation.  She does not think she is getting anywhere by massaging it.  She notices mild cognitive deficits.  A detailed review of systems today was otherwise stable.   HISTORY OF CURRENT ILLNESS: From the original intake note:  Joanne Hansen had routine screening mammography on 04/10/2019 showing a possible abnormality in the right breast. She underwent unilateral right diagnostic mammography with tomography and right breast ultrasonography at [facility] on 04/12/2019 showing: Breast Density Category B. Spot compression views of the medial right breast, including tomography confirm an irregular mass spanning approximately 0.9 cm. On physical exam, no mass is palpated in the upper inner quadrant of the right breast. Targeted ultrasound is performed, showing a single irregular mass versus two immediately adjacent small irregular masses, that in total measure 1.1 cm. Individually, the two irregular masses measure 0.8 x 0.4 x 0.5 cm and 0.4 x 0.4 x 0.4 cm, respectively. Mass(es) are at 1 o'clock position 3 cm from the nipple. Ultrasound of the right axilla is negative for lymphadenopathy.  Accordingly on 04/16/2019 she proceeded to biopsy of the right breast area in question. The pathology from this procedure showed ([id number]): invasive ductal carcinoma, grade II. Prognostic indicators significant for: estrogen receptor, 0% negative and progesterone receptor, 0% negative,. Proliferation marker Ki67 at 20%. HER2 negative (1+) by immunohistochemistry.  The patient's subsequent history is as detailed below.   PAST MEDICAL HISTORY: Past Medical History:  Diagnosis Date  . Arthritis   . Breast cancer ([place])  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  . Family history of brain cancer   . Family history of breast cancer   . Family history of colon cancer   .  Fecal incontinence    05-16-2019 per pt only a little leakage but controllable (was in clinical trial for [facility] without benefit.)  . Frequent headaches   . History of syncope    pre-syncope  08/ 2015  per pt no issue with this since   . Hyperlipidemia   . Malignant neoplasm of upper-inner quadrant of right breast in female, estrogen receptor negative [facility]) oncologist-- dr Jana Hakim  dr moody   dx 08/ 2020--  invasive ductal carcinoma,  05-13-2019  s/p right breast lumpectomy   . PAF (paroxysmal atrial fibrillation) ([place]) (05-16-2019   pt from [place], recently moved to [place] to be near daughter, has not established a cardiology yet--  previously seen by dr Lowella Dandy cossu (last office note dated 02-28-2018 scanned in epic)---  echo 04-15-2014  ef 55-60%, G1DD mild AR without stenosis, RVSp 82mHg  . Personal history of radiation therapy    stopped 09/22/19   . Rectal cyst   . Thyroid goiter    pt ultrasound in epic 06-04-2014 , nodules, no bx done  . Urinary incontinence, mixed     PAST SURGICAL HISTORY: Past Surgical History:  Procedure Laterality Date  . ABDOMINAL HYSTERECTOMY  1978   w/  RSO  and APPENDECTOMY  . BREAST BIOPSY Right 08/2009   benign  . BREAST LUMPECTOMY WITH RADIOACTIVE SEED AND SENTINEL LYMPH NODE BIOPSY Right 05/15/2019   Procedure: RIGHT BREAST LUMPECTOMY WITH RADIOACTIVE SEED AND SENTINEL LYMPH NODE MAPPING;  Surgeon: CErroll Luna MD;  [facility]  Service: General;  Laterality: Right;  . BREAST SURGERY  2011   biospy of right breast  . CATARACT EXTRACTION W/ INTRAOCULAR LENS  IMPLANT, BILATERAL  2004  . COLONOSCOPY  11/21/2019  . FLEXIBLE SIGMOIDOSCOPY N/A 05/18/2019   Procedure: FLEXIBLE SIGMOIDOSCOPY,  TRANSANAL EXCISION inclusion cyst;  Surgeon: TLeighton Ruff MD;  [facility]  Service: General;  Laterality: N/A;  . gluteus medius repair  2017  . LUMBAR LAMINECTOMY  2013   L5 -- S1  . PORT-A-CATH REMOVAL  Right 09/27/2019   Procedure: PORT REMOVAL;  Surgeon: CErroll Luna MD;  [facility]  Service: General;  Laterality: Right;  . PORTACATH PLACEMENT Right 05/15/2019   Procedure: INSERTION PORT-A-CATH WITH ULTRASOUND;  Surgeon: CErroll Luna MD;  [facility]  Service: General;  Laterality: Right;  . ROTATOR CUFF REPAIR Left 2001    FAMILY HISTORY: Family History  Problem Relation Age of Onset  . COPD Mother   . Cancer Mother 616      colon  . Arthritis Mother   . Hypertension Mother   . Miscarriages / SKoreaMother   . Stroke Mother 773 . Alzheimer's disease Mother 85 . Heart disease Father   . Heart attack Father 214 . Lung disease Father   . Arthritis Sister   . Breast cancer Sister 772      Lumpectomy  . Diabetes Maternal Grandmother   . Breast cancer Niece 346      Louise's Daughter  . Cervical cancer Niece 466  The patient's father died at age 3669from a heart attack.  The patient's mother died at age 72 she had a gastrointestinal cancer diagnosed age 215  The patient has  1 sister with breast cancer and one niece with breast cancer diagnosed at age 45.   GYNECOLOGIC HISTORY:  No LMP recorded. Patient has had a hysterectomy. 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  Her husband Joanne Hansen (goes by "Ronald Pippins") is a retired Chief Financial Officer.  Their daughter Joanne Hansen (currently divorced) lives in [place].  The patient has no grandchildren.  She is not a church attender   ADVANCED DIRECTIVES: The patient's husband is her healthcare power of attorney   HEALTH MAINTENANCE: Social History   Tobacco Use  . Smoking status: Never Smoker  . Smokeless tobacco: Never Used  Vaping Use    . Vaping Use: Never used  Substance Use Topics  . Alcohol use: Yes    Comment: Cocktails-3 days per week  . Drug use: Never    Types: Amphetamines    Colonoscopy: March 2021/  PAP: Status post hysterectomy  Bone density: Pending   Allergies  Allergen Reactions  . Penicillins Swelling  . Sulfa Antibiotics Nausea And Vomiting    Current Outpatient Medications  Medication Sig Dispense Refill  . CALCIUM-VITAMIN D PO Take 2 capsules by mouth daily. Vit D 1000 with minerals    . carboxymethylcellulose (REFRESH PLUS) 0.5 % SOLN 1 drop 3 (three) times daily as needed.    . Cyanocobalamin (VITAMIN B 12 PO) Take 1,000 mg by mouth daily. sublingal    . methylcellulose oral powder Take by mouth every evening.     . metoprolol tartrate (LOPRESSOR) 25 MG tablet Take 1/2 (one-half) tablet by mouth twice daily 90 tablet 0  . simvastatin (ZOCOR) 40 MG tablet Take 1 tablet by mouth once daily 90 tablet 0   Current Facility-Administered Medications  Medication Dose Route Frequency Provider Last Rate Last Admin  . 0.9 %  sodium chloride infusion  500 mL Intravenous Once Thornton Park, MD         OBJECTIVE: Middle-aged white woman who appears well  Vitals:   05/21/20 1441  BP: 116/63  Pulse: 60  Resp: 17  Temp: 97.8 F (36.6 C)  SpO2: 100%   Wt Readings from Last 3 Encounters:  05/21/20 121 lb 12.8 oz (55.2 kg)  02/27/20 123 lb 9.6 oz (56.1 kg)  12/20/19 131 lb 3.2 oz (59.5 kg)   Body mass index is 22.28 kg/m.    ECOG FS:1 - Symptomatic but completely ambulatory   Sclerae unicteric, EOMs intact Wearing a mask No cervical or supraclavicular adenopathy Lungs no rales or rhonchi Heart regular rate and rhythm Abd soft, nontender, positive bowel sounds MSK no focal spinal tenderness, no upper extremity lymphedema Neuro: nonfocal, well oriented, appropriate affect Breasts: The right breast is status post lumpectomy and radiation.  There is significant scar tissue as  previously noted especially in the superior aspect.  Nevertheless the breast contour is only minimally affected.  The left breast is benign.  Both axillae are benign.   LAB RESULTS:  CMP     Component Value Date/Time   NA 139 05/21/2020 1413   K 4.3 05/21/2020 1413   CL 104 05/21/2020 1413   CO2 27 05/21/2020 1413   GLUCOSE 93 05/21/2020 1413   BUN 15 05/21/2020 1413   CREATININE 0.73 05/21/2020 1413   CREATININE 0.77 05/07/2019 1547   CALCIUM  9.7 05/21/2020 1413   PROT 7.0 05/21/2020 1413   ALBUMIN 4.0 05/21/2020 1413   AST 19 05/21/2020 1413   AST 19 05/07/2019 1547   ALT 18 05/21/2020 1413   ALT 13 05/07/2019 1547   ALKPHOS 63 05/21/2020 1413   BILITOT 0.6 05/21/2020 1413   BILITOT 0.5 05/07/2019 1547   GFRNONAA >60 05/21/2020 1413   GFRNONAA >60 05/07/2019 1547   GFRAA >60 05/21/2020 1413   GFRAA >60 05/07/2019 1547    No results found for: TOTALPROTELP, ALBUMINELP, A1GS, A2GS, BETS, BETA2SER, GAMS, MSPIKE, SPEI  No results found for: KPAFRELGTCHN, LAMBDASER, KAPLAMBRATIO  Lab Results  Component Value Date   WBC 5.8 05/21/2020   NEUTROABS 3.7 05/21/2020   HGB 13.0 05/21/2020   HCT 39.4 05/21/2020   MCV 100.3 (H) 05/21/2020   PLT 203 05/21/2020    No results found for: LABCA2  No components found for: CVELFY101  No results for input(s): INR in the last 168 hours.  No results found for: LABCA2  No results found for: BPZ025  No results found for: ENI778  No results found for: EUM353  No results found for: CA2729  No components found for: HGQUANT  No results found for: CEA1 / No results found for: CEA1   No results found for: AFPTUMOR  No results found for: CHROMOGRNA  No results found for: HGBA, HGBA2QUANT, HGBFQUANT, HGBSQUAN (Hemoglobinopathy evaluation)   No results found for: LDH  Lab Results  Component Value Date   IRON 117 12/20/2019   TIBC 298 12/20/2019   IRONPCTSAT 39 12/20/2019   (Iron and TIBC)  Lab Results  Component  Value Date   FERRITIN 92 12/20/2019    Urinalysis    Component Value Date/Time   BILIRUBINUR Negative 02/27/2020 1123   PROTEINUR Negative 02/27/2020 1123   UROBILINOGEN 0.2 02/27/2020 1123   NITRITE Negative 02/27/2020 1123   LEUKOCYTESUR Trace (A) 02/27/2020 1123     STUDIES:  No results found.   ELIGIBLE FOR AVAILABLE RESEARCH PROTOCOL: NO   ASSESSMENT: 74 y.o. [place], [place] woman status post right breast upper quadrant biopsy for an mT1b (or single T1c)  invasive ductal carcinoma, grade 2, triple negative, with an MIB-1-1 of 20%.  (1) Genetic testing on 05/07/2019 through the [facility] Breast Cancer STAT Panel + Common Hereditary cancer panel found no pathogenic mutations. The STAT Breast cancer panel offered by [facility] includes sequencing and rearrangement analysis for the following 9 genes:  ATM, BRCA1, BRCA2, CDH1, CHEK2, PALB2, PTEN, STK11 and TP53.  The Common Hereditary Cancers Panel offered by [facility] includes sequencing and/or deletion duplication testing of the following 47 genes: APC, ATM, AXIN2, BARD1, BMPR1A, BRCA1, BRCA2, BRIP1, CDH1, CDKN2A (p14ARF), CDKN2A (p16INK4a), CKD4, CHEK2, CTNNA1, DICER1, EPCAM (Deletion/duplication testing only), GREM1 (promoter region deletion/duplication testing only), KIT, MEN1, MLH1, MSH2, MSH3, MSH6, MUTYH, NBN, NF1, NHTL1, PALB2, PDGFRA, PMS2, POLD1, POLE, PTEN, RAD50, RAD51C, RAD51D, SDHB, SDHC, SDHD, SMAD4, SMARCA4. STK11, TP53, TSC1, TSC2, and VHL.  The following genes were evaluated for sequence changes only: SDHA and HOXB13 c.251G>A variant only.  (2) s/p right lumpectomy and sentinel lymph node sampling 05/15/2019 for a pT1b pN0 stage IB invasive ductal carcinoma, grade 3, with negative margins.  (a) a total of 2 sentinel lymph nodes were removed  (3) adjuvant chemotherapy consisting of cyclophosphamide and docetaxel every 21 days x 4, started 05/29/2019, completed 07/31/2019  (4) adjuvant radiation 08/27/2019 - 09/21/2019 The right  breast was treated to 42.56 Gy in 16 fractions followed by an 8  Gy boost to the surgical cavity.  (5) opted against prophylactic antiestrogens  (6) macrocytic anemia: moderate  (a) normal B12, folate, and iron studies April 2021   PLAN: Shery is now a year out from definitive surgery for her breast cancer with no evidence of disease recurrence.  This is very favorable.  She is having mild cognitive deficits.  Some of this of course is average for age.  Some of it may be due to to "chemo fall" or "I think is more accurate, and mild posttraumatic stress.  We discussed the fact that this does not require any intervention such as an antidepressant for example since she is managing quite well.  Nevertheless I find many of my patients are easily distracted after a diagnosis of cancer and this lasts usually up to 3 years after which things to get back to pretty much to normal.  I think the itching she is feeling when she starts to sweat is physiologic and not due to chemo or shingles.  The solution I think is to sweat more so not just swing a little but sweat more in which case she will not itch.  She would like all her mammograms to be read by 2 different radiologists.  I reassured her that our radiologists only do mammography, that they are extremely experienced, and that they do not miss much.  Nevertheless if we can achieve that for her I will be glad to try and I have sent a request to one of our radiologist to see if they can do a second reading of the mammogram already read in August.  I commended her exercise program and weight loss.  She will see me again in 6 months.  She knows to call for any other issue that may develop before the next visit.  Total encounter time 35 minutes.Sarajane Jews C. Kareem Cathey, MD  05/21/20 6:05 PM Medical Oncology and Hematology [facility] [place], [place] [address] Tel. [phone]    Fax. [phone]    I, Wilburn Mylar, am acting as scribe for Dr. Virgie Dad. Ha Placeres.  I, Lurline Del MD, have reviewed the above documentation for accuracy and completeness, and I agree with the above.   *Total Encounter Time as defined by the Centers for Medicare and Medicaid Services includes, in addition to the face-to-face time of a patient visit (documented in the note above) non-face-to-face time: obtaining and reviewing outside history, ordering and reviewing medications, tests or procedures, care coordination (communications with other health care professionals or caregivers) and documentation in the medical record.

## 2020-05-21 NOTE — Telephone Encounter (Signed)
Scheduled appointments per 9/29 los. Gave patient calendar print out.

## 2020-06-02 ENCOUNTER — Other Ambulatory Visit: Payer: Self-pay | Admitting: Family Medicine

## 2020-07-24 ENCOUNTER — Other Ambulatory Visit: Payer: Self-pay | Admitting: Family Medicine

## 2020-07-25 ENCOUNTER — Ambulatory Visit: Payer: Medicare Other

## 2020-07-25 NOTE — Progress Notes (Signed)
Subjective:   Joanne Hansen is a 74 y.o. female who presents for Medicare Annual (Subsequent) preventive examination.  Review of Systems    N/A  Cardiac Risk Factors include: advanced age (>26men, >33 women);dyslipidemia     Objective:    Today's Vitals   07/28/20 1450  BP: 116/70  Pulse: 79  Temp: 98.4 F (36.9 C)  TempSrc: Oral  SpO2: 98%  Weight: 119 lb 6 oz (54.1 kg)  Height: 5\' 2"  (1.575 m)   Body mass index is 21.83 kg/m.  Advanced Directives 07/28/2020 10/08/2019 09/27/2019 08/14/2019 05/18/2019 05/15/2019 05/08/2019  Does Patient Have a Medical Advance Directive? No No No No No No No  Would patient like information on creating a medical advance directive? No - Patient declined No - Patient declined No - Patient declined - No - Patient declined Yes (MAU/Ambulatory/Procedural Areas - Information given) -    Current Medications (verified) Outpatient Encounter Medications as of 07/28/2020  Medication Sig  . CALCIUM-VITAMIN D PO Take 2 capsules by mouth daily. Vit D 1000 with minerals  . carboxymethylcellulose (REFRESH PLUS) 0.5 % SOLN 1 drop 3 (three) times daily as needed.  . Cyanocobalamin (VITAMIN B 12 PO) Take 1,000 mg by mouth daily. sublingal  . methylcellulose oral powder Take by mouth every evening.   . metoprolol tartrate (LOPRESSOR) 25 MG tablet TAKE 1/2 (ONE-HALF) TABLET BY MOUTH TWICE DAILY  . simvastatin (ZOCOR) 40 MG tablet Take 1 tablet by mouth once daily   Facility-Administered Encounter Medications as of 07/28/2020  Medication  . 0.9 %  sodium chloride infusion    Allergies (verified) Penicillins and Sulfa antibiotics   History: Past Medical History:  Diagnosis Date  . Arthritis   . Breast cancer (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 Family history of brain cancer   . Family history of breast cancer   . Family history of colon cancer   . Fecal incontinence    05-16-2019 per pt only a little leakage but controllable (was in  clinical trial for [facility] without benefit.)  . Frequent headaches   . History of syncope    pre-syncope  08/ 2015  per pt no issue with this since   . Hyperlipidemia   . Malignant neoplasm of upper-inner quadrant of right breast in female, estrogen receptor negative [facility]) oncologist-- dr Jana Hakim  dr moody   dx 08/ 2020--  invasive ductal carcinoma,  05-13-2019  s/p right breast lumpectomy   . PAF (paroxysmal atrial fibrillation) ([place]) (05-16-2019   pt from [place], recently moved to [place] to be near daughter, has not established a cardiology yet--  previously seen by dr Lowella Dandy cossu (last office note dated 02-28-2018 scanned in epic)---  echo 04-15-2014  ef 55-60%, G1DD mild AR without stenosis, RVSp 49mmHg  . Personal history of radiation therapy    stopped 09/22/19   . Rectal cyst   . Thyroid goiter    pt ultrasound in epic 06-04-2014 , nodules, no bx done  . Urinary incontinence, mixed    Past Surgical History:  Procedure Laterality Date  . ABDOMINAL HYSTERECTOMY  1978   w/  RSO  and APPENDECTOMY  . BREAST BIOPSY Right 08/2009   benign  . BREAST LUMPECTOMY WITH RADIOACTIVE SEED AND SENTINEL LYMPH NODE BIOPSY Right 05/15/2019   Procedure: RIGHT BREAST LUMPECTOMY WITH RADIOACTIVE SEED AND SENTINEL LYMPH NODE MAPPING;  Surgeon: Erroll Luna, MD;  Location: [facility];  Service: General;  Laterality: Right;  .  BREAST SURGERY  2011   biospy of right breast  . CATARACT EXTRACTION W/ INTRAOCULAR LENS  IMPLANT, BILATERAL  2004  . COLONOSCOPY  11/21/2019  . FLEXIBLE SIGMOIDOSCOPY N/A 05/18/2019   Procedure: FLEXIBLE SIGMOIDOSCOPY,  TRANSANAL EXCISION inclusion cyst;  Surgeon: Leighton Ruff, MD;  Location: [facility];  Service: General;  Laterality: N/A;  . gluteus medius repair  2017  . LUMBAR LAMINECTOMY  2013   L5 -- S1  . PORT-A-CATH REMOVAL Right 09/27/2019   Procedure: PORT REMOVAL;  Surgeon: Erroll Luna, MD;  Location: [facility];   Service: General;  Laterality: Right;  . PORTACATH PLACEMENT Right 05/15/2019   Procedure: INSERTION PORT-A-CATH WITH ULTRASOUND;  Surgeon: Erroll Luna, MD;  Location: [facility];  Service: General;  Laterality: Right;  . ROTATOR CUFF REPAIR Left 2001   Family History  Problem Relation Age of Onset  . COPD Mother   . Cancer Mother 11       colon  . Arthritis Mother   . Hypertension Mother   . Miscarriages / [place] Mother   . Stroke Mother 64  . Alzheimer's disease Mother 49  . Heart disease Father   . Heart attack Father 65  . Lung disease Father   . Arthritis Sister   . Breast cancer Sister 23       Lumpectomy  . Diabetes Maternal Grandmother   . Breast cancer Niece 15       Louise's Daughter  . Cervical cancer Niece 61   Social History   Socioeconomic History  . Marital status: Married    Spouse name: Not on file  . Number of children: Not on file  . Years of education: Not on file  . Highest education level: Not on file  Occupational History  . Not on file  Tobacco Use  . Smoking status: Never Smoker  . Smokeless tobacco: Never Used  Vaping Use  . Vaping Use: Never used  Substance and Sexual Activity  . Alcohol use: Yes    Comment: Cocktails-3 days per week  . Drug use: Never    Types: Amphetamines  . Sexual activity: Not on file  Other Topics Concern  . Not on file  Social History Narrative  . Not on file   Social Determinants of Health   Financial Resource Strain: Low Risk   . Difficulty of Paying Living Expenses: Not hard at all  Food Insecurity: No Food Insecurity  . Worried About Charity fundraiser in the Last Year: Never true  . Ran Out of Food in the Last Year: Never true  Transportation Needs:   . Lack of Transportation (Medical): Not on file  . Lack of Transportation (Non-Medical): Not on file  Physical Activity: Inactive  . Days of Exercise per Week: 0 days  . Minutes of Exercise per Session: 0 min  Stress: No  Stress Concern Present  . Feeling of Stress : Not at all  Social Connections: Moderately Isolated  . Frequency of Communication with Friends and Family: More than three times a week  . Frequency of Social Gatherings with Friends and Family: Once a week  . Attends Religious Services: Never  . Active Member of Clubs or Organizations: No  . Attends Archivist Meetings: Never  . Marital Status: Married    Tobacco Counseling Counseling given: Not Answered   Clinical Intake:  Pre-visit preparation completed: Yes  Pain : No/denies pain     Nutritional Risks: None Diabetes:  No  How often do you need to have someone help you when you read instructions, pamphlets, or other written materials from your doctor or pharmacy?: 1 - Never What is the last grade level you completed in school?: High School  Diabetic?No   Interpreter Needed?: No  Information entered by :: [place] of Daily Living In your present state of health, do you have any difficulty performing the following activities: 07/28/2020 09/27/2019  Hearing? Y N  Comment Has some issues when there is excessive background noise -  Vision? N N  Difficulty concentrating or making decisions? N N  Walking or climbing stairs? N N  Dressing or bathing? N N  Doing errands, shopping? N -  Preparing Food and eating ? N -  Using the Toilet? N -  In the past six months, have you accidently leaked urine? Y -  Comment Has some occassional bladder leakage -  Do you have problems with loss of bowel control? N -  Managing your Medications? N -  Managing your Finances? N -  Housekeeping or managing your Housekeeping? N -  Some recent data might be hidden    Patient Care Team: Caren Macadam, MD as PCP - General (Family Medicine) Lorretta Harp, MD as PCP - Cardiology (Cardiology) Magrinat, Virgie Dad, MD as Consulting Physician (Oncology) Erroll Luna, MD as Consulting Physician (General Surgery)  Kyung Rudd, MD as Consulting Physician (Radiation Oncology) Leighton Ruff, MD as Consulting Physician (General Surgery) Bjorn Loser, MD as Consulting Physician (Urology) Suella Broad, MD as Consulting Physician (Physical Medicine and Rehabilitation)  Indicate any recent [place] you may have received from other than [facility] providers in the past year (date may be approximate).     Assessment:   This is a routine wellness examination for Kamea.  Hearing/Vision screen  Hearing Screening   125Hz  250Hz  500Hz  1000Hz  2000Hz  3000Hz  4000Hz  6000Hz  8000Hz   Right ear:           Left ear:           Vision Screening Comments: Patient states gets eyes checked once per year. Recently had eye infection  Dietary issues and exercise activities discussed: Current Exercise Habits: Home exercise routine, Type of exercise: walking, Time (Minutes): 35, Frequency (Times/Week): 5, Weekly Exercise (Minutes/Week): 175, Intensity: Moderate, Exercise limited by: None identified  Goals    . Patient stated     I will continue to walk at least a couple miles 5 times per week     . Patient Stated     I would like to stay healthy.       Depression Screen PHQ 2/9 Scores 07/28/2020 07/02/2019 11/08/2018  PHQ - 2 Score 0 0 0  PHQ- 9 Score 0 - -    Fall Risk Fall Risk  07/28/2020 07/02/2019 11/08/2018  Falls in the past year? 0 0 0  Number falls in past yr: 0 0 0  Injury with Fall? 0 0 0  Risk for fall due to : No Fall Risks - -  Follow up Falls evaluation completed;Falls prevention discussed - Falls evaluation completed    Any stairs in or around the home? No  If so, are there any without handrails? No  Home free of loose throw rugs in walkways, pet beds, electrical cords, etc? Yes  Adequate lighting in your home to reduce risk of falls? Yes   ASSISTIVE DEVICES UTILIZED TO PREVENT FALLS:  Life alert? No  Use of a cane, walker or  w/c? No  Grab bars in the bathroom? Yes  Shower chair or  bench in shower? Yes  Elevated toilet seat or a handicapped toilet? No   TIMED UP AND GO:  Was the test performed? Yes .  Length of time to ambulate 10 feet: 4  sec.   Gait steady and fast without use of assistive device  Cognitive Function:    Cognitive screening within normal limits based on direct observation     Immunizations Immunization History  Administered Date(s) Administered  . DTaP 04/12/2012  . Influenza, High Dose Seasonal PF 05/02/2019  . Influenza-Unspecified 06/01/2011, 05/17/2012, 06/27/2013, 06/03/2014, 05/19/2015, 04/23/2017, 05/06/2018, 05/02/2019  . PFIZER SARS-COV-2 Vaccination 09/16/2019, 10/22/2019, 06/28/2020  . Pneumococcal Conjugate-13 06/03/2014  . Pneumococcal Polysaccharide-23 12/26/2012  . Td 08/23/1994  . Tdap 04/11/2012, 04/12/2012  . Zoster 09/04/2010    TDAP status: Up to date Flu Vaccine status: Completed at today's visit Pneumococcal vaccine status: Up to date Covid-19 vaccine status: Completed vaccines  Qualifies for Shingles Vaccine? Yes   Zostavax completed Yes   Shingrix Completed?: No.    Education has been provided regarding the importance of this vaccine. Patient has been advised to call insurance company to determine out of pocket expense if they have not yet received this vaccine. Advised may also receive vaccine at local pharmacy or Health Dept. Verbalized acceptance and understanding.  Screening Tests Health Maintenance  Topic Date Due  . INFLUENZA VACCINE  03/23/2020  . MAMMOGRAM  04/10/2022  . TETANUS/TDAP  04/12/2022  . COLONOSCOPY  11/20/2024  . DEXA SCAN  Completed  . COVID-19 Vaccine  Completed  . Hepatitis C Screening  Completed  . PNA vac Low Risk Adult  Completed    Health Maintenance  Health Maintenance Due  Topic Date Due  . INFLUENZA VACCINE  03/23/2020    Colorectal cancer screening: Completed 11/21/2019. Repeat every 5 years Mammogram status: Completed 04/10/2020. Repeat every year Bone Density  status: Completed 04/10/2020. Results reflect: Bone density results: OSTEOPENIA. Repeat every 5 years.  Lung Cancer Screening: (Low Dose CT Chest recommended if Age 58-80 years, 30 pack-year currently smoking OR have quit w/in 15years.) does not qualify.   Lung Cancer Screening Referral: N/A   Additional Screening:  Hepatitis C Screening: does qualify; Completed 07/02/2019  Vision Screening: Recommended annual ophthalmology exams for early detection of glaucoma and other disorders of the eye. Is the patient up to date with their annual eye exam?  Yes  Who is the provider or what is the name of the office in which the patient attends annual eye exams? Dr. Joya San If pt is not established with a provider, would they like to be referred to a provider to establish care? No .   Dental Screening: Recommended annual dental exams for proper oral hygiene  Community Resource Referral / Chronic Care Management: CRR required this visit?  No   CCM required this visit?  No      Plan:     I have personally reviewed and noted the following in the patient's chart:   . Medical and social history . Use of alcohol, tobacco or illicit drugs  . Current medications and supplements . Functional ability and status . Nutritional status . Physical activity . Advanced directives . List of other physicians . Hospitalizations, surgeries, and ER visits in previous 12 months . Vitals . Screenings to include cognitive, depression, and falls . Referrals and appointments  In addition, I have reviewed and discussed with patient certain preventive protocols, quality  metrics, and best practice recommendations. A written personalized care plan for preventive services as well as general preventive health recommendations were provided to patient.     Ofilia Neas, LPN   15/10/7941   Nurse Notes: None

## 2020-07-28 ENCOUNTER — Other Ambulatory Visit: Payer: Self-pay

## 2020-07-28 ENCOUNTER — Ambulatory Visit (INDEPENDENT_AMBULATORY_CARE_PROVIDER_SITE_OTHER): Payer: Medicare Other

## 2020-07-28 VITALS — BP 116/70 | HR 79 | Temp 98.4°F | Ht 62.0 in | Wt 119.4 lb

## 2020-07-28 DIAGNOSIS — Z0000 Encounter for general adult medical examination without abnormal findings: Secondary | ICD-10-CM | POA: Diagnosis not present

## 2020-07-28 DIAGNOSIS — Z0100 Encounter for examination of eyes and vision without abnormal findings: Secondary | ICD-10-CM | POA: Diagnosis not present

## 2020-07-28 NOTE — Patient Instructions (Signed)
[name] , Thank you for taking time to come for your Medicare Wellness Visit. I appreciate your ongoing commitment to your health goals. Please review the following plan we discussed and let me know if I can assist you in the future.   Screening recommendations/referrals: Colonoscopy: Up to date, next due 11/20/2024 Mammogram: Up to date, next due 04/10/2021 Bone Density: Up to date, next due 04/10/2025 Recommended yearly ophthalmology/optometry visit for glaucoma screening and checkup Recommended yearly dental visit for hygiene and checkup  Vaccinations: Influenza vaccine: Up to date, next due fall 2022 Pneumococcal vaccine: Completed series  Tdap vaccine: Up to date, next due 04/12/2022 Shingles vaccine: Currently due for Shingrix, if you wish to receive we recommend that you do so at your local pharmacy as it will be less expensive     Advanced directives: Advance directive discussed with you today. Even though you declined this today please call our office should you change your mind and we can give you the proper paperwork for you to fill out.   Conditions/risks identifiedGery Pray [phone]  Next appointment: None   Preventive Care 42 Years and Older, Female Preventive care refers to lifestyle ch/oices and visits with your health care provider that can promote health and wellness. What does preventive care include?  A yearly physical exam. This is also called an annual well check.  Dental exams once or twice a year.  Routine eye exams. Ask your health care provider how often you should have your eyes checked.  Personal lifestyle choices, including:  Daily care of your teeth and gums.  Regular physical activity.  Eating a healthy diet.  Avoiding tobacco and drug use.  Limiting alcohol use.  Practicing safe sex.  Taking low-dose aspirin every day.  Taking vitamin and mineral supplements as recommended by your health care provider. What happens  during an annual well check? The services and screenings done by your health care provider during your annual well check will depend on your age, overall health, lifestyle risk factors, and family history of disease. Counseling  Your health care provider may ask you questions about your:  Alcohol use.  Tobacco use.  Drug use.  Emotional well-being.  Home and relationship well-being.  Sexual activity.  Eating habits.  History of falls.  Memory and ability to understand (cognition).  Work and work Statistician.  Reproductive health. Screening  You may have the following tests or measurements:  Height, weight, and BMI.  Blood pressure.  Lipid and cholesterol levels. These may be checked every 5 years, or more frequently if you are over 41 years old.  Skin check.  Lung cancer screening. You may have this screening every year starting at age 52 if you have a 30-pack-year history of smoking and currently smoke or have quit within the past 15 years.  Fecal occult blood test (FOBT) of the stool. You may have this test every year starting at age 30.  Flexible sigmoidoscopy or colonoscopy. You may have a sigmoidoscopy every 5 years or a colonoscopy every 10 years starting at age 20.  Hepatitis C blood test.  Hepatitis B blood test.  Sexually transmitted disease (STD) testing.  Diabetes screening. This is done by checking your blood sugar (glucose) after you have not eaten for a while (fasting). You may have this done every 1-3 years.  Bone density scan. This is done to screen for osteoporosis. You may have this done starting at age 30.  Mammogram. This may be done every 1-2  years. Talk to your health care provider about how often you should have regular mammograms. Talk with your health care provider about your test results, treatment options, and if necessary, the need for more tests. Vaccines  Your health care provider may recommend certain vaccines, such  as:  Influenza vaccine. This is recommended every year.  Tetanus, diphtheria, and acellular pertussis (Tdap, Td) vaccine. You may need a Td booster every 10 years.  Zoster vaccine. You may need this after age 32.  Pneumococcal 13-valent conjugate (PCV13) vaccine. One dose is recommended after age 33.  Pneumococcal polysaccharide (PPSV23) vaccine. One dose is recommended after age 9. Talk to your health care provider about which screenings and vaccines you need and how often you need them. This information is not intended to replace advice given to you by your health care provider. Make sure you discuss any questions you have with your health care provider. Document Released: 09/05/2015 Document Revised: 04/28/2016 Document Reviewed: 06/10/2015 Elsevier Interactive Patient Education  2017 [employer] Prevention in the Home Falls can cause injuries. They can happen to people of all ages. There are many things you can do to make your home safe and to help prevent falls. What can I do on the outside of my home?  Regularly fix the edges of walkways and driveways and fix any cracks.  Remove anything that might make you trip as you walk through a door, such as a raised step or threshold.  Trim any bushes or trees on the path to your home.  Use bright outdoor lighting.  Clear any walking paths of anything that might make someone trip, such as rocks or tools.  Regularly check to see if handrails are loose or broken. Make sure that both sides of any steps have handrails.  Any raised decks and porches should have guardrails on the edges.  Have any leaves, snow, or ice cleared regularly.  Use sand or salt on walking paths during winter.  Clean up any spills in your garage right away. This includes oil or grease spills. What can I do in the bathroom?  Use night lights.  Install grab bars by the toilet and in the tub and shower. Do not use towel bars as grab bars.  Use  non-skid mats or decals in the tub or shower.  If you need to sit down in the shower, use a plastic, non-slip stool.  Keep the floor dry. Clean up any water that spills on the floor as soon as it happens.  Remove soap buildup in the tub or shower regularly.  Attach bath mats securely with double-sided non-slip rug tape.  Do not have throw rugs and other things on the floor that can make you trip. What can I do in the bedroom?  Use night lights.  Make sure that you have a light by your bed that is easy to reach.  Do not use any sheets or blankets that are too big for your bed. They should not hang down onto the floor.  Have a firm chair that has side arms. You can use this for support while you get dressed.  Do not have throw rugs and other things on the floor that can make you trip. What can I do in the kitchen?  Clean up any spills right away.  Avoid walking on wet floors.  Keep items that you use a lot in easy-to-reach places.  If you need to reach something above you, use a strong step  stool that has a grab bar.  Keep electrical cords out of the way.  Do not use floor polish or wax that makes floors slippery. If you must use wax, use non-skid floor wax.  Do not have throw rugs and other things on the floor that can make you trip. What can I do with my stairs?  Do not leave any items on the stairs.  Make sure that there are handrails on both sides of the stairs and use them. Fix handrails that are broken or loose. Make sure that handrails are as long as the stairways.  Check any carpeting to make sure that it is firmly attached to the stairs. Fix any carpet that is loose or worn.  Avoid having throw rugs at the top or bottom of the stairs. If you do have throw rugs, attach them to the floor with carpet tape.  Make sure that you have a light switch at the top of the stairs and the bottom of the stairs. If you do not have them, ask someone to add them for you. What  else can I do to help prevent falls?  Wear shoes that:  Do not have high heels.  Have rubber bottoms.  Are comfortable and fit you well.  Are closed at the toe. Do not wear sandals.  If you use a stepladder:  Make sure that it is fully opened. Do not climb a closed stepladder.  Make sure that both sides of the stepladder are locked into place.  Ask someone to hold it for you, if possible.  Clearly mark and make sure that you can see:  Any grab bars or handrails.  First and last steps.  Where the edge of each step is.  Use tools that help you move around (mobility aids) if they are needed. These include:  Canes.  Walkers.  Scooters.  Crutches.  Turn on the lights when you go into a dark area. Replace any light bulbs as soon as they burn out.  Set up your furniture so you have a clear path. Avoid moving your furniture around.  If any of your floors are uneven, fix them.  If there are any pets around you, be aware of where they are.  Review your medicines with your doctor. Some medicines can make you feel dizzy. This can increase your chance of falling. Ask your doctor what other things that you can do to help prevent falls. This information is not intended to replace advice given to you by your health care provider. Make sure you discuss any questions you have with your health care provider. Document Released: 06/05/2009 Document Revised: 01/15/2016 Document Reviewed: 09/13/2014 Elsevier Interactive Patient Education  2017 [employer].

## 2020-08-29 ENCOUNTER — Other Ambulatory Visit: Payer: Self-pay | Admitting: Family Medicine

## 2020-10-30 ENCOUNTER — Other Ambulatory Visit: Payer: Self-pay | Admitting: Family Medicine

## 2020-11-17 NOTE — Progress Notes (Signed)
Joanne Hansen  [phone] [phone]    ID: Joanne Hansen DOB: [birth year]  MR#: [id number]  DXA#:[id number]  Patient Care Team: Caren Macadam, MD as PCP - General (Family Medicine) Lorretta Harp, MD as PCP - Cardiology (Cardiology) Briannon Boggio, Virgie Dad, MD as Consulting Physician (Oncology) Erroll Luna, MD as Consulting Physician (General Surgery) Kyung Rudd, MD as Consulting Physician (Radiation Oncology) Leighton Ruff, MD as Consulting Physician (General Surgery) Bjorn Loser, MD as Consulting Physician (Urology) Suella Broad, MD as Consulting Physician (Physical Medicine and Rehabilitation) OTHER MD:  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 She is now under observation.  Since her last visit, she has not undergone any additional studies. She is up to date on mammography, most recently on 04/10/2020.  Bone density on the same day showed a T score of -1.9   REVIEW OF SYSTEMS: Joanne Hansen is walking about 2 miles most days.  She is doing gardening.  She wants to go swimming this summer and we discussed that.  She is overall "doing good".  The one thing that is bothering her is continuing itching over the radiation port area.  This is troubling to her particularly in the mornings, sometimes in the evenings as well.  Aside from that a detailed review of systems today was stable   COVID 19 VACCINATION STATUS: fully vaccinated [employer]), with booster 06/2020   HISTORY OF CURRENT ILLNESS: From the original intake note:  Joanne Hansen had routine screening mammography on 04/10/2019 showing a possible abnormality in the right breast. She underwent unilateral right diagnostic mammography with tomography and right breast ultrasonography at [facility] on 04/12/2019 showing: Breast Density Category B. Spot compression views of the medial right breast, including tomography confirm an irregular mass spanning approximately 0.9 cm. On physical exam, no mass is palpated in the upper inner quadrant of the right breast. Targeted ultrasound is performed, showing a single irregular mass versus two immediately adjacent small irregular masses, that in total measure 1.1 cm. Individually, the two irregular masses measure 0.8 x 0.4 x 0.5 cm and 0.4 x 0.4 x 0.4 cm, respectively. Mass(es) are at 1 o'clock position 3 cm from the nipple. Ultrasound of the right axilla is negative for lymphadenopathy.  Accordingly on 04/16/2019 she proceeded to biopsy of the right breast area in question. The pathology from this procedure showed ([id number]): invasive ductal carcinoma, grade II. Prognostic indicators significant for: estrogen receptor, 0% negative and progesterone receptor, 0% negative,. Proliferation marker Ki67 at 20%. HER2 negative (1+) by immunohistochemistry.  The patient's subsequent history is as detailed below.   PAST MEDICAL HISTORY: Past Medical History:  Diagnosis Date  . Arthritis   . Breast cancer ([place])    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  . Family history of brain cancer   . Family history of breast cancer   . Family history of colon cancer   . Fecal incontinence    05-16-2019 per pt only a little leakage but controllable (was in clinical trial for [facility] without benefit.)  . Frequent headaches   . History of syncope    pre-syncope  08/ 2015  per pt no issue with this since   . Hyperlipidemia   . Malignant neoplasm of upper-inner quadrant of right breast in female, estrogen receptor negative [facility]) oncologist-- dr Jana Hakim  dr Lisbeth Renshaw  dx 08/ 2020--  invasive ductal carcinoma,  05-13-2019  s/p right breast lumpectomy   . PAF (paroxysmal atrial fibrillation) (HCC) (05-16-2019   pt from [place], recently moved to [place] to be near  daughter, has not established a cardiology yet--  previously seen by dr sergio cossu (last office note dated 02-28-2018 scanned in epic)---  echo 04-15-2014  ef 55-60%, G1DD mild AR without stenosis, RVSp 31mmHg  . Personal history of radiation therapy    stopped 09/22/19   . Rectal cyst   . Thyroid goiter    pt ultrasound in epic 06-04-2014 , nodules, no bx done  . Urinary incontinence, mixed     PAST SURGICAL HISTORY: Past Surgical History:  Procedure Laterality Date  . ABDOMINAL HYSTERECTOMY  1978   w/  RSO  and APPENDECTOMY  . BREAST BIOPSY Right 08/2009   benign  . BREAST LUMPECTOMY WITH RADIOACTIVE SEED AND SENTINEL LYMPH NODE BIOPSY Right 05/15/2019   Procedure: RIGHT BREAST LUMPECTOMY WITH RADIOACTIVE SEED AND SENTINEL LYMPH NODE MAPPING;  Surgeon: Cornett, Thomas, MD;  Location: [facility];  Service: General;  Laterality: Right;  . BREAST SURGERY  2011   biospy of right breast  . CATARACT EXTRACTION W/ INTRAOCULAR LENS  IMPLANT, BILATERAL  2004  . COLONOSCOPY  11/21/2019  . FLEXIBLE SIGMOIDOSCOPY N/A 05/18/2019   Procedure: FLEXIBLE SIGMOIDOSCOPY,  TRANSANAL EXCISION inclusion cyst;  Surgeon: Thomas, Alicia, MD;  Location: [facility];  Service: General;  Laterality: N/A;  . gluteus medius repair  2017  . LUMBAR LAMINECTOMY  2013   L5 -- S1  . PORT-A-CATH REMOVAL Right 09/27/2019   Procedure: PORT REMOVAL;  Surgeon: Cornett, Thomas, MD;  Location: [facility];  Service: General;  Laterality: Right;  . PORTACATH PLACEMENT Right 05/15/2019   Procedure: INSERTION PORT-A-CATH WITH ULTRASOUND;  Surgeon: Cornett, Thomas, MD;  Location: [facility];  Service: General;  Laterality: Right;  . ROTATOR CUFF REPAIR Left 2001    FAMILY HISTORY: Family History  Problem Relation Age of Onset  . COPD Mother   . Cancer Mother 66       colon  . Arthritis Mother   . Hypertension Mother   . Miscarriages / Stillbirths Mother   .  Stroke Mother 75  . Alzheimer's disease Mother 85  . Heart disease Father   . Heart attack Father 27  . Lung disease Father   . Arthritis Sister   . Breast cancer Sister 73       Lumpectomy  . Diabetes Maternal Grandmother   . Breast cancer Niece 38       Louise's Daughter  . Cervical cancer Niece 41  The patient's father died at age 67 from a heart attack.  The patient's mother died at age 80; she had a gastrointestinal cancer diagnosed age 56.  The patient has 1 sister with breast cancer and one niece with breast cancer diagnosed at age 38.   GYNECOLOGIC HISTORY:  No LMP recorded. Patient has had a hysterectomy. 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  Her husband Gerard (goes by "Gerry") is a retired engineer.  Their daughter Kathryn Vonfeldt (currently divorced) lives in [place].  The patient has no grandchildren.  She is not a church attender     ADVANCED DIRECTIVES: The patient's husband is her healthcare power of attorney   HEALTH MAINTENANCE: Social History   Tobacco Use  . Smoking status: Never Smoker  . Smokeless tobacco: Never Used  Vaping Use  . Vaping Use: Never used  Substance Use Topics  . Alcohol use: Yes    Comment: Cocktails-3 days per week  . Drug use: Never    Types: Amphetamines    Colonoscopy: March 2021/  PAP: Status post hysterectomy  Bone density: Pending   Allergies  Allergen Reactions  . Penicillins Swelling  . Sulfa Antibiotics Nausea And Vomiting    Current Outpatient Medications  Medication Sig Dispense Refill  . CALCIUM-VITAMIN D PO Take 2 capsules by mouth daily. Vit D 1000 with minerals    . carboxymethylcellulose (REFRESH PLUS) 0.5 % SOLN 1 drop 3 (three) times daily as needed.    .  Cyanocobalamin (VITAMIN B 12 PO) Take 1,000 mg by mouth daily. sublingal    . methylcellulose oral powder Take by mouth every evening.     . metoprolol tartrate (LOPRESSOR) 25 MG tablet TAKE 1/2 (ONE-HALF) TABLET BY MOUTH TWICE DAILY *NEED TO MAKE AN APPOINTMENT* 90 tablet 0  . simvastatin (ZOCOR) 40 MG tablet Take 1 tablet by mouth once daily 90 tablet 0   Current Facility-Administered Medications  Medication Dose Route Frequency Provider Last Rate Last Admin  . 0.9 %  sodium chloride infusion  500 mL Intravenous Once Beavers, Kimberly, MD         OBJECTIVE: white woman who appears well  Vitals:   11/18/20 1046  BP: 119/76  Pulse: 62  Resp: 18  Temp: 97.7 F (36.5 C)  SpO2: 100%   Wt Readings from Last 3 Encounters:  11/18/20 120 lb 9.6 oz (54.7 kg)  07/28/20 119 lb 6 oz (54.1 kg)  05/21/20 121 lb 12.8 oz (55.2 kg)   Body mass index is 22.06 kg/m.    ECOG FS:1 - Symptomatic but completely ambulatory   Sclerae unicteric, EOMs intact Wearing a mask No cervical or supraclavicular adenopathy Lungs no rales or rhonchi Heart regular rate and rhythm Abd soft, nontender, positive bowel sounds MSK no focal spinal tenderness, no upper extremity lymphedema Neuro: nonfocal, well oriented, appropriate affect Breasts: The right breast is status post surgery and radiation.  There is some distortion of the contour but overall the cosmetic result is good.  There are no skin changes of concern and there is no evidence of local recurrence.   LAB RESULTS:  CMP     Component Value Date/Time   NA 143 11/18/2020 0958   K 4.0 11/18/2020 0958   CL 104 11/18/2020 0958   CO2 27 11/18/2020 0958   GLUCOSE 90 11/18/2020 0958   BUN 11 11/18/2020 0958   CREATININE 0.75 11/18/2020 0958   CREATININE 0.77 05/07/2019 1547   CALCIUM 9.4 11/18/2020 0958   PROT 7.5 11/18/2020 0958   ALBUMIN 4.4 11/18/2020 0958   AST 20 11/18/2020 0958   AST 19 05/07/2019 1547   ALT 19 11/18/2020 0958   ALT 13  05/07/2019 1547   ALKPHOS 66 11/18/2020 0958   BILITOT 1.0 11/18/2020 0958   BILITOT 0.5 05/07/2019 1547   GFRNONAA >60 11/18/2020 0958   GFRNONAA >60 05/07/2019 1547   GFRAA >60 05/21/2020 1413   GFRAA >60 05/07/2019 1547    No results found for: TOTALPROTELP, ALBUMINELP, A1GS, A2GS, BETS, BETA2SER, GAMS, MSPIKE, SPEI  No results found for: KPAFRELGTCHN, LAMBDASER, KAPLAMBRATIO  Lab Results  Component Value   Date   WBC 4.9 11/18/2020   NEUTROABS 2.7 11/18/2020   HGB 13.9 11/18/2020   HCT 41.3 11/18/2020   MCV 100.2 (H) 11/18/2020   PLT 213 11/18/2020    No results found for: LABCA2  No components found for: LABCAN125  No results for input(s): INR in the last 168 hours.  No results found for: LABCA2  No results found for: CAN199  No results found for: CAN125  No results found for: CAN153  No results found for: CA2729  No components found for: HGQUANT  No results found for: CEA1 / No results found for: CEA1   No results found for: AFPTUMOR  No results found for: CHROMOGRNA  No results found for: HGBA, HGBA2QUANT, HGBFQUANT, HGBSQUAN (Hemoglobinopathy evaluation)   No results found for: LDH  Lab Results  Component Value Date   IRON 117 12/20/2019   TIBC 298 12/20/2019   IRONPCTSAT 39 12/20/2019   (Iron and TIBC)  Lab Results  Component Value Date   FERRITIN 92 12/20/2019    Urinalysis    Component Value Date/Time   BILIRUBINUR Negative 02/27/2020 1123   PROTEINUR Negative 02/27/2020 1123   UROBILINOGEN 0.2 02/27/2020 1123   NITRITE Negative 02/27/2020 1123   LEUKOCYTESUR Trace (A) 02/27/2020 1123    STUDIES:  No results found.   ELIGIBLE FOR AVAILABLE RESEARCH PROTOCOL: NO   ASSESSMENT: 75 y.o. , [place] woman status post right breast upper quadrant biopsy for an mT1b (or single T1c)  invasive ductal carcinoma, grade 2, triple negative, with an MIB-1-1 of 20%.  (1) Genetic testing on 05/07/2019 through the [facility] Breast Cancer  STAT Panel + Common Hereditary cancer panel found no pathogenic mutations. The STAT Breast cancer panel offered by [facility] includes sequencing and rearrangement analysis for the following 9 genes:  ATM, BRCA1, BRCA2, CDH1, CHEK2, PALB2, PTEN, STK11 and TP53.  The Common Hereditary Cancers Panel offered by [facility] includes sequencing and/or deletion duplication testing of the following 47 genes: APC, ATM, AXIN2, BARD1, BMPR1A, BRCA1, BRCA2, BRIP1, CDH1, CDKN2A (p14ARF), CDKN2A (p16INK4a), CKD4, CHEK2, CTNNA1, DICER1, EPCAM (Deletion/duplication testing only), GREM1 (promoter region deletion/duplication testing only), KIT, MEN1, MLH1, MSH2, MSH3, MSH6, MUTYH, NBN, NF1, NHTL1, PALB2, PDGFRA, PMS2, POLD1, POLE, PTEN, RAD50, RAD51C, RAD51D, SDHB, SDHC, SDHD, SMAD4, SMARCA4. STK11, TP53, TSC1, TSC2, and VHL.  The following genes were evaluated for sequence changes only: SDHA and HOXB13 c.251G>A variant only.  (2) s/p right lumpectomy and sentinel lymph node sampling 05/15/2019 for a pT1b pN0 stage IB invasive ductal carcinoma, grade 3, with negative margins.  (a) a total of 2 sentinel lymph nodes were removed  (3) adjuvant chemotherapy consisting of cyclophosphamide and docetaxel every 21 days x 4, started 05/29/2019, completed 07/31/2019  (4) adjuvant radiation 08/27/2019 - 09/21/2019 The right breast was treated to 42.56 Gy in 16 fractions followed by an 8 Gy boost to the surgical cavity.  (5) opted against prophylactic antiestrogens  (a) bone density 04/10/2020 shows a T score of -1.9     PLAN: Joey is now a year and a half out from definitive surgery for her breast cancer with no evidence of disease recurrence.  This is favorable.  We discussed the itching she is experiencing in the right breast and axilla.  This is going to be related to her prior treatment.  It does not indicate recurrent breast cancer.  I think what would work best there is ice.  If that gets to be tedious she can try Benadryl  cream.  Other more   complicated options would include gabapentin or steroids.  I cautioned her against excessive scratching which can cause more permanent skin changes and I will be more difficult to take care of.  She will have her next mammogram in August.  She will see me in September.  After that visit we will start seeing her on a once a year basis  Total encounter time 20 minutes.*   Gustav C. Magrinat, MD  11/18/20 11:09 AM Medical Oncology and Hematology [facility] [address] [place], [place] [address] Tel. [phone]    Fax. [phone]    I, Katie Daubenspeck, am acting as scribe for Dr. Gustav C. Magrinat.  I, Gustav Magrinat MD, have reviewed the above documentation for accuracy and completeness, and I agree with the above.   *Total Encounter Time as defined by the Centers for Medicare and Medicaid Services includes, in addition to the face-to-face time of a patient visit (documented in the note above) non-face-to-face time: obtaining and reviewing outside history, ordering and reviewing medications, tests or procedures, care coordination (communications with other health care professionals or caregivers) and documentation in the medical record.  

## 2020-11-18 ENCOUNTER — Other Ambulatory Visit: Payer: Self-pay

## 2020-11-18 ENCOUNTER — Inpatient Hospital Stay (HOSPITAL_BASED_OUTPATIENT_CLINIC_OR_DEPARTMENT_OTHER): Payer: Medicare Other | Admitting: Oncology

## 2020-11-18 ENCOUNTER — Inpatient Hospital Stay: Payer: Medicare Other | Attending: Oncology

## 2020-11-18 VITALS — BP 119/76 | HR 62 | Temp 97.7°F | Resp 18 | Ht 62.0 in | Wt 120.6 lb

## 2020-11-18 DIAGNOSIS — Z171 Estrogen receptor negative status [ER-]: Secondary | ICD-10-CM | POA: Diagnosis not present

## 2020-11-18 DIAGNOSIS — C50211 Malignant neoplasm of upper-inner quadrant of right female breast: Secondary | ICD-10-CM

## 2020-11-18 DIAGNOSIS — Z853 Personal history of malignant neoplasm of breast: Secondary | ICD-10-CM | POA: Diagnosis not present

## 2020-11-18 DIAGNOSIS — Z9221 Personal history of antineoplastic chemotherapy: Secondary | ICD-10-CM | POA: Insufficient documentation

## 2020-11-18 LAB — CBC WITH DIFFERENTIAL/PLATELET
Abs Immature Granulocytes: 0.01 10*3/uL (ref 0.00–0.07)
Basophils Absolute: 0.1 10*3/uL (ref 0.0–0.1)
Basophils Relative: 1 %
Eosinophils Absolute: 0.2 10*3/uL (ref 0.0–0.5)
Eosinophils Relative: 4 %
HCT: 41.3 % (ref 36.0–46.0)
Hemoglobin: 13.9 g/dL (ref 12.0–15.0)
Immature Granulocytes: 0 %
Lymphocytes Relative: 27 %
Lymphs Abs: 1.3 10*3/uL (ref 0.7–4.0)
MCH: 33.7 pg (ref 26.0–34.0)
MCHC: 33.7 g/dL (ref 30.0–36.0)
MCV: 100.2 fL — ABNORMAL HIGH (ref 80.0–100.0)
Monocytes Absolute: 0.6 10*3/uL (ref 0.1–1.0)
Monocytes Relative: 12 %
Neutro Abs: 2.7 10*3/uL (ref 1.7–7.7)
Neutrophils Relative %: 56 %
Platelets: 213 10*3/uL (ref 150–400)
RBC: 4.12 MIL/uL (ref 3.87–5.11)
RDW: 12 % (ref 11.5–15.5)
WBC: 4.9 10*3/uL (ref 4.0–10.5)
nRBC: 0 % (ref 0.0–0.2)

## 2020-11-18 LAB — COMPREHENSIVE METABOLIC PANEL
ALT: 19 U/L (ref 0–44)
AST: 20 U/L (ref 15–41)
Albumin: 4.4 g/dL (ref 3.5–5.0)
Alkaline Phosphatase: 66 U/L (ref 38–126)
Anion gap: 12 (ref 5–15)
BUN: 11 mg/dL (ref 8–23)
CO2: 27 mmol/L (ref 22–32)
Calcium: 9.4 mg/dL (ref 8.9–10.3)
Chloride: 104 mmol/L (ref 98–111)
Creatinine, Ser: 0.75 mg/dL (ref 0.44–1.00)
GFR, Estimated: >60 mL/min (ref >60)
Glucose, Bld: 90 mg/dL (ref 70–99)
Potassium: 4 mmol/L (ref 3.5–5.1)
Sodium: 143 mmol/L (ref 135–145)
Total Bilirubin: 1 mg/dL (ref 0.3–1.2)
Total Protein: 7.5 g/dL (ref 6.5–8.1)

## 2020-11-20 ENCOUNTER — Telehealth: Payer: Self-pay | Admitting: Oncology

## 2020-11-20 NOTE — Telephone Encounter (Signed)
Scheduled appt per 3/29 los. Pt aware.

## 2020-11-28 ENCOUNTER — Other Ambulatory Visit: Payer: Self-pay | Admitting: Family Medicine

## 2020-12-05 ENCOUNTER — Encounter (HOSPITAL_COMMUNITY): Payer: Self-pay

## 2020-12-05 ENCOUNTER — Ambulatory Visit (HOSPITAL_COMMUNITY)
Admission: EM | Admit: 2020-12-05 | Discharge: 2020-12-05 | Disposition: A | Discharge status: Home / Self Care | Payer: Medicare Other

## 2020-12-05 DIAGNOSIS — U071 COVID-19: Secondary | ICD-10-CM | POA: Diagnosis not present

## 2020-12-05 NOTE — ED Provider Notes (Signed)
[facility]    CSN: [id number] Arrival date & time: 12/05/20  1926      History   Chief Complaint Chief Complaint  Patient presents with  . Fever  . Covid positive  . Headache    HPI Joanne Hansen is a 75 y.o. female.   Pt tested positive for COVID yesterday.  Reports fever and headache at home.  Fever of 100.7 earlier today, improved with tylenol.  She denies cough, shortness of breath.  She reports nasal congestion.  She is vaccinated against COVID, has had a booster as well.  Husband is also positive for COVID.       Past Medical History:  Diagnosis Date  . Arthritis   . Breast cancer ([place])    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  . Family history of brain cancer   . Family history of breast cancer   . Family history of colon cancer   . Fecal incontinence    05-16-2019 per pt only a little leakage but controllable (was in clinical trial for [facility] without benefit.)  . Frequent headaches   . History of syncope    pre-syncope  08/ 2015  per pt no issue with this since   . Hyperlipidemia   . Malignant neoplasm of upper-inner quadrant of right breast in female, estrogen receptor negative [facility]) oncologist-- dr Jana Hakim  dr moody   dx 08/ 2020--  invasive ductal carcinoma,  05-13-2019  s/p right breast lumpectomy   . PAF (paroxysmal atrial fibrillation) ([place]) (05-16-2019   pt from [place], recently moved to [place] to be near daughter, has not established a cardiology yet--  previously seen by dr Lowella Dandy cossu (last office note dated 02-28-2018 scanned in epic)---  echo 04-15-2014  ef 55-60%, G1DD mild AR without stenosis, RVSp 60mmHg  . Personal history of radiation therapy    stopped 09/22/19   . Rectal cyst   . Thyroid goiter    pt ultrasound in epic 06-04-2014 , nodules, no bx done  . Urinary incontinence, mixed     Patient Active Problem List   Diagnosis Date Noted  . Anemia, macrocytic 09/25/2019  . Paroxysmal atrial fibrillation ([place])  08/02/2019  . Dizziness 08/02/2019  . Lower extremity edema 08/02/2019  . Genetic testing 05/16/2019  . Malignant neoplasm of upper-inner quadrant of right breast in female, estrogen receptor negative ([place]) 05/07/2019  . Family history of breast cancer   . Family history of colon cancer   . Family history of brain cancer   . Hyperlipidemia 11/08/2018  . Fecal incontinence 11/08/2018  . Urinary, incontinence, stress female 11/08/2018    Past Surgical History:  Procedure Laterality Date  . ABDOMINAL HYSTERECTOMY  1978   w/  RSO  and APPENDECTOMY  . BREAST BIOPSY Right 08/2009   benign  . BREAST LUMPECTOMY WITH RADIOACTIVE SEED AND SENTINEL LYMPH NODE BIOPSY Right 05/15/2019   Procedure: RIGHT BREAST LUMPECTOMY WITH RADIOACTIVE SEED AND SENTINEL LYMPH NODE MAPPING;  Surgeon: Erroll Luna, MD;  Location: [facility];  Service: General;  Laterality: Right;  . BREAST SURGERY  2011   biospy of right breast  . CATARACT EXTRACTION W/ INTRAOCULAR LENS  IMPLANT, BILATERAL  2004  . COLONOSCOPY  11/21/2019  . FLEXIBLE SIGMOIDOSCOPY N/A 05/18/2019   Procedure: FLEXIBLE SIGMOIDOSCOPY,  TRANSANAL EXCISION inclusion cyst;  Surgeon: Leighton Ruff, MD;  [facility];  Service: General;  Laterality: N/A;  . gluteus medius repair  2017  . LUMBAR LAMINECTOMY  2013   L5 -- S1  . PORT-A-CATH REMOVAL Right 09/27/2019   Procedure: PORT REMOVAL;  Surgeon: Erroll Luna, MD;  Location: [facility];  Service: General;  Laterality: Right;  . PORTACATH PLACEMENT Right 05/15/2019   Procedure: INSERTION PORT-A-CATH WITH ULTRASOUND;  Surgeon: Erroll Luna, MD;  Location: [facility];  Service: General;  Laterality: Right;  . ROTATOR CUFF REPAIR Left 2001    OB History   No obstetric history on file.      Home Medications    Prior to Admission medications   Medication Sig Start Date End Date Taking? Authorizing Provider   CALCIUM-VITAMIN D PO Take 2 capsules by mouth daily. Vit D 1000 with minerals    [provider]  carboxymethylcellulose (REFRESH PLUS) 0.5 % SOLN 1 drop 3 (three) times daily as needed.    [provider]  Cyanocobalamin (VITAMIN B 12 PO) Take 1,000 mg by mouth daily. sublingal    [provider]  methylcellulose oral powder Take by mouth every evening.     [provider]  metoprolol tartrate (LOPRESSOR) 25 MG tablet TAKE 1/2 (ONE-HALF) TABLET BY MOUTH TWICE DAILY *NEED  TO  MAKE  AN  APPOINTMENT* 11/28/20   Caren Macadam, MD  simvastatin (ZOCOR) 40 MG tablet Take 1 tablet by mouth once daily 10/30/20   Caren Macadam, MD    Family History Family History  Problem Relation Age of Onset  . COPD Mother   . Cancer Mother 42       colon  . Arthritis Mother   . Hypertension Mother   . Miscarriages / [place] Mother   . Stroke Mother 66  . Alzheimer's disease Mother 39  . Heart disease Father   . Heart attack Father 57  . Lung disease Father   . Arthritis Sister   . Breast cancer Sister 30       Lumpectomy  . Diabetes Maternal Grandmother   . Breast cancer Niece 51       Louise's Daughter  . Cervical cancer Niece 66    Social History Social History   Tobacco Use  . Smoking status: Never Smoker  . Smokeless tobacco: Never Used  Vaping Use  . Vaping Use: Never used  Substance Use Topics  . Alcohol use: Yes    Comment: Cocktails-3 days per week  . Drug use: Never    Types: Amphetamines     Allergies   Penicillins and Sulfa antibiotics   Review of Systems Review of Systems  Constitutional: Positive for fever. Negative for chills.  HENT: Positive for congestion. Negative for ear pain and sore throat.   Eyes: Negative for pain and visual disturbance.  Respiratory: Negative for cough, shortness of breath and wheezing.   Cardiovascular: Negative for chest pain and palpitations.  Gastrointestinal: Negative for abdominal pain  and vomiting.  Genitourinary: Negative for dysuria and hematuria.  Musculoskeletal: Negative for arthralgias and back pain.  Skin: Negative for color change and rash.  Neurological: Positive for headaches. Negative for seizures and syncope.  All other systems reviewed and are negative.    Physical Exam Triage Vital Signs ED Triage Vitals  Enc Vitals Group     BP 12/05/20 1942 134/79     Pulse Rate 12/05/20 1942 97     Resp 12/05/20 1942 17     Temp 12/05/20 1942 98.2 F (36.8 C)     Temp src --      SpO2 12/05/20 1942 99 %  Weight --      Height --      Head Circumference --      Peak Flow --      Pain Score 12/05/20 1941 0     Pain Loc --      Pain Edu? --      Excl. in [place]? --    No data found.  Updated Vital Signs BP 134/79   Pulse 97   Temp 98.2 F (36.8 C)   Resp 17   SpO2 99%   Visual Acuity Right Eye Distance:   Left Eye Distance:   Bilateral Distance:    Right Eye Near:   Left Eye Near:    Bilateral Near:     Physical Exam Vitals and nursing note reviewed.  Constitutional:      General: She is not in acute distress.    Appearance: She is well-developed.  HENT:     Head: Normocephalic and atraumatic.  Eyes:     Conjunctiva/sclera: Conjunctivae normal.  Cardiovascular:     Rate and Rhythm: Normal rate and regular rhythm.     Heart sounds: No murmur heard.   Pulmonary:     Effort: Pulmonary effort is normal. No respiratory distress.     Breath sounds: Normal breath sounds.  Abdominal:     Palpations: Abdomen is soft.     Tenderness: There is no abdominal tenderness.  Musculoskeletal:     Cervical back: Neck supple.  Skin:    General: Skin is warm and dry.  Neurological:     Mental Status: She is alert.      UC Treatments / Results  Labs (all labs ordered are listed, but only abnormal results are displayed) Labs Reviewed - No data to display  EKG   Radiology No results found.  Procedures Procedures (including critical  care time)  Medications Ordered in UC Medications - No data to display  Initial Impression / Assessment and Plan / UC Course  I have reviewed the triage vital signs and the nursing notes.  Pertinent labs & imaging results that were available during my care of the patient were reviewed by me and considered in my medical decision making (see chart for details).     COVID, recommend supportive treatment.  Advised pt to seek medical care if she is experiencing shortness of breath, trouble breathing.  Vitals wnl here, O2 99%. Referral sent to COVID infusion clinic.  Final Clinical Impressions(s) / UC Diagnoses   Final diagnoses:  COVID     Discharge Instructions     Can take Coricidin which is safe to take for patients with high blood pressure Seek medical care if you develop shortness of breath Can continue Tylenol as needed for fever or headache    ED Prescriptions    None     PDMP not reviewed this encounter.   Konrad Felix, PA-C 12/05/20 2000

## 2020-12-05 NOTE — ED Triage Notes (Signed)
Pt in with c/o fever tmax 100.7 and headache. Also states she had positive covid test yesterday   Pt  Has been taking tylenol for sxs

## 2020-12-05 NOTE — Discharge Instructions (Addendum)
Can take Coricidin which is safe to take for patients with high blood pressure Seek medical care if you develop shortness of breath Can continue Tylenol as needed for fever or headache

## 2020-12-06 ENCOUNTER — Telehealth: Payer: Self-pay | Admitting: Unknown Physician Specialty

## 2020-12-06 ENCOUNTER — Other Ambulatory Visit (HOSPITAL_COMMUNITY): Payer: Self-pay

## 2020-12-06 MED ORDER — NIRMATRELVIR & RITONAVIR 20 X 150 MG & 10 X 100MG PO TBPK
3.0000 | ORAL_TABLET | Freq: Two times a day (BID) | ORAL | 0 refills | Status: AC
  Filled 2020-12-06: qty 30, 5d supply, fill #0

## 2020-12-06 NOTE — Telephone Encounter (Signed)
Outpatient Oral COVID Treatment Note  I connected with Joanne Hansen on 12/06/2020/10:38 AM by telephone and verified that I am speaking with the correct person using two identifiers.  I discussed the limitations, risks, security, and privacy concerns of performing an evaluation and management service by telephone and the availability of in person appointments. I also discussed with the patient that there may be a patient responsible charge related to this service. The patient expressed understanding and agreed to proceed.  Patient location: home Provider location: home  Diagnosis: COVID-19 infection  Purpose of visit: Discussion of potential use of Molnupiravir or Paxlovid, a new treatment for mild to moderate COVID-19 viral infection in non-hospitalized patients.   Subjective: Patient is a 75 y.o. female who has been diagnosed with COVID 19 viral infection.  Their symptoms began on 4/13 with scratchy throat.    Past Medical History:  Diagnosis Date  . Arthritis   . 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 Family history of brain cancer   . Family history of breast cancer   . Family history of colon cancer   . Fecal incontinence    05-16-2019 per pt only a little leakage but controllable (was in clinical trial for [facility] without benefit.)  . Frequent headaches   . History of syncope    pre-syncope  08/ 2015  per pt no issue with this since   . Hyperlipidemia   . Malignant neoplasm of upper-inner quadrant of right breast in female, estrogen receptor negative [facility]) oncologist-- dr Jana Hakim  dr moody   dx 08/ 2020--  invasive ductal carcinoma,  05-13-2019  s/p right breast lumpectomy   . PAF (paroxysmal atrial fibrillation) ([place]) (05-16-2019   pt from [place], recently moved to [place] to be near daughter, has not established a cardiology yet--  previously seen by dr Lowella Dandy cossu (last office note dated 02-28-2018 scanned in epic)---  echo 04-15-2014  ef 55-60%,  G1DD mild AR without stenosis, RVSp 16mmHg  . Personal history of radiation therapy    stopped 09/22/19   . Rectal cyst   . Thyroid goiter    pt ultrasound in epic 06-04-2014 , nodules, no bx done  . Urinary incontinence, mixed     Allergies  Allergen Reactions  . Penicillins Swelling  . Sulfa Antibiotics Nausea And Vomiting     Current Outpatient Medications:  .  CALCIUM-VITAMIN D PO, Take 2 capsules by mouth daily. Vit D 1000 with minerals, Disp: , Rfl:  .  carboxymethylcellulose (REFRESH PLUS) 0.5 % SOLN, 1 drop 3 (three) times daily as needed., Disp: , Rfl:  .  Cyanocobalamin (VITAMIN B 12 PO), Take 1,000 mg by mouth daily. sublingal, Disp: , Rfl:  .  methylcellulose oral powder, Take by mouth every evening. , Disp: , Rfl:  .  metoprolol tartrate (LOPRESSOR) 25 MG tablet, TAKE 1/2 (ONE-HALF) TABLET BY MOUTH TWICE DAILY *NEED  TO  MAKE  AN  APPOINTMENT*, Disp: 90 tablet, Rfl: 0 .  simvastatin (ZOCOR) 40 MG tablet, Take 1 tablet by mouth once daily, Disp: 90 tablet, Rfl: 0  Current Facility-Administered Medications:  .  0.9 %  sodium chloride infusion, 500 mL, Intravenous, Once, [place], MD  Objective: Patient appears/sounds congested.  They are in no apparent distress.  Breathing is non labored.  Mood and behavior are normal.  Laboratory Data:  Recent Results (from the past 2160 hour(s))  Comprehensive metabolic panel     Status: None  Collection Time: 11/18/20  9:58 AM  Result Value Ref Range   Sodium 143 135 - 145 mmol/L   Potassium 4.0 3.5 - 5.1 mmol/L   Chloride 104 98 - 111 mmol/L   CO2 27 22 - 32 mmol/L   Glucose, Bld 90 70 - 99 mg/dL    Comment: Glucose reference range applies only to samples taken after fasting for at least 8 hours.   BUN 11 8 - 23 mg/dL   Creatinine, Ser 0.75 0.44 - 1.00 mg/dL   Calcium 9.4 8.9 - 10.3 mg/dL   Total Protein 7.5 6.5 - 8.1 g/dL   Albumin 4.4 3.5 - 5.0 g/dL   AST 20 15 - 41 U/L   ALT 19 0 - 44 U/L   Alkaline  Phosphatase 66 38 - 126 U/L   Total Bilirubin 1.0 0.3 - 1.2 mg/dL   GFR, Estimated >60 >60 mL/min    Comment: (NOTE) Calculated using the CKD-EPI Creatinine Equation (2021)    Anion gap 12 5 - 15    Comment: Performed at [facility], [place] [address] [place], [place] [address]  CBC with Differential/Platelet     Status: Abnormal   Collection Time: 11/18/20  9:58 AM  Result Value Ref Range   WBC 4.9 4.0 - 10.5 K/uL   RBC 4.12 3.87 - 5.11 MIL/uL   Hemoglobin 13.9 12.0 - 15.0 g/dL   HCT 41.3 36.0 - 46.0 %   MCV 100.2 (H) 80.0 - 100.0 fL   MCH 33.7 26.0 - 34.0 pg   MCHC 33.7 30.0 - 36.0 g/dL   RDW 12.0 11.5 - 15.5 %   Platelets 213 150 - 400 K/uL   nRBC 0.0 0.0 - 0.2 %   Neutrophils Relative % 56 %   Neutro Abs 2.7 1.7 - 7.7 K/uL   Lymphocytes Relative 27 %   Lymphs Abs 1.3 0.7 - 4.0 K/uL   Monocytes Relative 12 %   Monocytes Absolute 0.6 0.1 - 1.0 K/uL   Eosinophils Relative 4 %   Eosinophils Absolute 0.2 0.0 - 0.5 K/uL   Basophils Relative 1 %   Basophils Absolute 0.1 0.0 - 0.1 K/uL   Immature Granulocytes 0 %   Abs Immature Granulocytes 0.01 0.00 - 0.07 K/uL    Comment: Performed at [facility], [place] [address], [place], [place] [address]     Assessment: 75 y.o. female with mild/moderate COVID 19 viral infection diagnosed on 4/15 at high risk for progression to severe COVID 19.  Plan:  This patient is a 75 y.o. female that meets the following criteria for Emergency Use Authorization of: Paxlovid 1. Age >12 yr AND > 40 kg 2. SARS-COV-2 positive test 3. Symptom onset < 5 days 4. Mild-to-moderate COVID disease with high risk for severe progression to hospitalization or death  I have spoken and communicated the following to the patient or parent/caregiver regarding: 1. Paxlovid is an unapproved drug that is authorized for use under an Emergency Use Authorization.  2. There are no adequate, approved, available products for the  treatment of COVID-19 in adults who have mild-to-moderate COVID-19 and are at high risk for progressing to severe COVID-19, including hospitalization or death. 3. Other therapeutics are currently authorized. For additional information on all products authorized for treatment or prevention of COVID-19, please see [email or web address].  4. There are benefits and risks of taking this treatment as outlined in the "Fact Sheet for Patients and Caregivers."  5. "Fact Sheet for  Patients and Caregivers" was reviewed with patient. A hard copy will be provided to patient from pharmacy prior to the patient receiving treatment. 6. Patients should continue to self-isolate and use infection control measures (e.g., wear mask, isolate, social distance, avoid sharing personal items, clean and disinfect "high touch" surfaces, and frequent handwashing) according to CDC guidelines.  7. The patient or parent/caregiver has the option to accept or refuse treatment. 8. Patient medication history was reviewed for potential drug interactions:Interaction with home meds: Simvastatin which she will hold 9. Patient's GFR was calculated to be >60, and they were therefore prescribed Normal dose (GFR>60) - nirmatrelvir 150mg  tab (2 tablet) by mouth twice daily AND ritonavir 100mg  tab (1 tablet) by mouth twice daily   After reviewing above information with the patient, the patient agrees to receive Paxlovid.  Follow up instructions:    . Take prescription BID x 5 days as directed . Reach out to pharmacist for counseling on medication if desired . For concerns regarding further COVID symptoms please follow up with your PCP or urgent care . For urgent or life-threatening issues, seek care at your local emergency department  The patient was provided an opportunity to ask questions, and all were answered. The patient agreed with the  plan and demonstrated an understanding of the instructions.   Script sent to [facility] and opted to pick up RX.  The patient was advised to call their PCP or seek an in-person evaluation if the symptoms worsen or if the condition fails to improve as anticipated.   I provided 20 minutes of non face-to-face telephone visit time during this encounter, and > 50% was spent counseling as documented under my assessment & plan.  Kathrine Haddock, NP 12/06/2020 /10:38 AM

## 2020-12-12 ENCOUNTER — Ambulatory Visit: Payer: Medicare Other | Admitting: Cardiovascular Disease

## 2021-01-20 ENCOUNTER — Other Ambulatory Visit: Payer: Self-pay

## 2021-01-20 ENCOUNTER — Ambulatory Visit (INDEPENDENT_AMBULATORY_CARE_PROVIDER_SITE_OTHER): Payer: Medicare Other | Admitting: Cardiovascular Disease

## 2021-01-20 ENCOUNTER — Encounter: Payer: Self-pay | Admitting: Cardiovascular Disease

## 2021-01-20 DIAGNOSIS — R002 Palpitations: Secondary | ICD-10-CM | POA: Diagnosis not present

## 2021-01-20 DIAGNOSIS — E782 Mixed hyperlipidemia: Secondary | ICD-10-CM

## 2021-01-20 NOTE — Assessment & Plan Note (Signed)
History of hyperlipidemia on statin therapy with lipid profile last performed 07/02/2019 revealing total cholesterol 59, LDL 79 HDL 54.

## 2021-01-20 NOTE — Patient Instructions (Signed)
Medication Instructions:  Your physician recommends that you continue on your current medications as directed. Please refer to the Current Medication list given to you today.  *If you need a refill on your cardiac medications before your next appointment, please call your pharmacy*   Lab Work: Your physician recommends that you return for lab work in: next week or two for fasting lipid/liver profile.  If you have labs (blood work) drawn today and your tests are completely normal, you will receive your results only by: Marland Kitchen MyChart Message (if you have MyChart) OR . A paper copy in the mail If you have any lab test that is abnormal or we need to change your treatment, we will call you to review the results.   Follow-Up: At [facility], you and your health needs are our priority.  As part of our continuing mission to provide you with exceptional heart care, we have created designated Provider Care Teams.  These Care Teams include your primary Cardiologist (physician) and Advanced Practice Providers (APPs -  Physician Assistants and Nurse Practitioners) who all work together to provide you with the care you need, when you need it.  We recommend signing up for the patient portal called "MyChart".  Sign up information is provided on this After Visit Summary.  MyChart is used to connect with patients for Virtual Visits (Telemedicine).  Patients are able to view lab/test results, encounter notes, upcoming appointments, etc.  Non-urgent messages can be sent to your provider as well.   To learn more about what you can do with MyChart, go to [email or web address].    Your next appointment:   12 month(s)  The format for your next appointment:   In Person  Provider:   Quay Burow, MD

## 2021-01-20 NOTE — Assessment & Plan Note (Signed)
History of palpitations with event monitoring performed a year ago that showed short runs of SVT.  She is on low-dose beta-blockade.

## 2021-01-20 NOTE — Progress Notes (Signed)
01/20/2021 [name]   [birth year]  [id number]  Primary Physician Joanne Macadam, MD Primary Cardiologist: Lorretta Harp MD Joanne Hansen, [place]  HPI:  Joanne Hansen is a 75 y.o.  thin and fit appearing married Caucasian female mother of 1 child who recently relocated from the [place] to [place] to be closer to family. She is retired from doing clerical work. She was referred by Dr. Ethlyn Hansen ,her PCP, for episodes of dizziness and remote PAF.  I last saw her in the office 12/14/2019.  She did see Joanne Memos, NP in the office 09/13/2019.Her only risk factor is mild hyperlipidemia on statin therapy. Her father did have a myocardial infarction at age 65 had bypass surgery. She is never smoked. She is never had a heart attack or stroke. She denies chest pain or shortness of breath but she does have fatigue. She developed breast cancer in September and had lumpectomy and has just finished her last round of chemotherapy and scheduled to start radiation therapy. She feels fatigued. She said 6 episodes of dizziness since starting chemotherapy without frank syncope. She did have PAF demonstrated by Holter monitor in [place] in 2014. She was followed by a cardiologist there who did not anticoagulate her. Since that time she has had infrequent episodes of brief tachypalpitations.  I did order an event monitor but resulted on 08/05/2019 that did not show any PAF.  She had short runs of PSVT.  A 2D echo performed 08/16/2019 was essentially normal.  Unfortunately she did contract herpes zoster on the left side of her face which she is significantly symptomatic from but is slowly improving on Valtrex.  She did have her last radiation treatment for her breast cancer 09/21/2019.  Since I saw her a year ago she continues to do well.  She gets occasional palpitations.  She walks several miles a day.  She is completely asymptomatic.  She is heading up  to [place] [facility] tomorrow by train to celebrate her 54th wedding anniversary.   Current Meds  Medication Sig  . CALCIUM-VITAMIN D PO Take 2 capsules by mouth daily. Vit D 1000 with minerals  . carboxymethylcellulose (REFRESH PLUS) 0.5 % SOLN 1 drop 3 (three) times daily as needed.  . Cyanocobalamin (VITAMIN B 12 PO) Take 1,000 mg by mouth daily. sublingal  . methylcellulose oral powder Take by mouth every evening.   . metoprolol tartrate (LOPRESSOR) 25 MG tablet TAKE 1/2 (ONE-HALF) TABLET BY MOUTH TWICE DAILY *NEED  TO  MAKE  AN  APPOINTMENT*  . simvastatin (ZOCOR) 40 MG tablet Take 1 tablet by mouth once daily   Current Facility-Administered Medications for the 01/20/21 encounter (Office Visit) with Lorretta Harp, MD  Medication  . 0.9 %  sodium chloride infusion     Allergies  Allergen Reactions  . Penicillins Swelling  . Sulfa Antibiotics Nausea And Vomiting    Social History   Socioeconomic History  . Marital status: Married    Spouse name: Not on file  . Number of children: Not on file  . Years of education: Not on file  . Highest education level: Not on file  Occupational History  . Not on file  Tobacco Use  . Smoking status: Never Smoker  . Smokeless tobacco: Never Used  Vaping Use  . Vaping Use: Never used  Substance and Sexual Activity  . Alcohol use: Yes    Comment: Cocktails-3 days per week  . Drug use:  Never    Types: Amphetamines  . Sexual activity: Not on file  Other Topics Concern  . Not on file  Social History Narrative  . Not on file   Social Determinants of Health   Financial Resource Strain: Low Risk   . Difficulty of Paying Living Expenses: Not hard at all  Food Insecurity: No Food Insecurity  . Worried About Charity fundraiser in the Last Year: Never true  . Ran Out of Food in the Last Year: Never true  Transportation Needs: Not on file  Physical Activity: Inactive  . Days of Exercise per Week: 0 days  . Minutes of Exercise per Session: 0  min  Stress: No Stress Concern Present  . Feeling of Stress : Not at all  Social Connections: Moderately Isolated  . Frequency of Communication with Friends and Family: More than three times a week  . Frequency of Social Gatherings with Friends and Family: Once a week  . Attends Religious Services: Never  . Active Member of Clubs or Organizations: No  . Attends Archivist Meetings: Never  . Marital Status: Married  Human resources officer Violence: Not At Risk  . Fear of Current or Ex-Partner: No  . Emotionally Abused: No  . Physically Abused: No  . Sexually Abused: No     Review of Systems: General: negative for chills, fever, night sweats or weight changes.  Cardiovascular: negative for chest pain, dyspnea on exertion, edema, orthopnea, palpitations, paroxysmal nocturnal dyspnea or shortness of breath Dermatological: negative for rash Respiratory: negative for cough or wheezing Urologic: negative for hematuria Abdominal: negative for nausea, vomiting, diarrhea, bright red blood per rectum, melena, or hematemesis Neurologic: negative for visual changes, syncope, or dizziness All other systems reviewed and are otherwise negative except as noted above.    Blood pressure 120/70, pulse 63, height 5\' 2"  (1.575 m), weight 118 lb 6.4 oz (53.7 kg), SpO2 99 %.  General appearance: alert and no distress Neck: no adenopathy, no carotid bruit, no JVD, supple, symmetrical, trachea midline and thyroid not enlarged, symmetric, no tenderness/mass/nodules Lungs: clear to auscultation bilaterally Heart: regular rate and rhythm, S1, S2 normal, no murmur, click, rub or gallop Extremities: extremities normal, atraumatic, no cyanosis or edema Pulses: 2+ and symmetric Skin: Skin color, texture, turgor normal. No rashes or lesions Neurologic: Alert and oriented X 3, normal strength and tone. Normal symmetric reflexes. Normal coordination and gait  EKG rhythm at 63 without ST or T wave changes.  I  personally reviewed this EKG.  ASSESSMENT AND PLAN:   Hyperlipidemia History of hyperlipidemia on statin therapy with lipid profile last performed 07/02/2019 revealing total cholesterol 59, LDL 79 HDL 54.  Palpitations History of palpitations with event monitoring performed a year ago that showed short runs of SVT.  She is on low-dose beta-blockade.      Lorretta Harp MD FACP,FACC,FAHA, [facility] 01/20/2021 10:23 AM

## 2021-02-06 ENCOUNTER — Other Ambulatory Visit: Payer: Self-pay | Admitting: Family Medicine

## 2021-02-06 LAB — LIPID PANEL
Chol/HDL Ratio: 2.2 ratio (ref 0.0–4.4)
Cholesterol, Total: 176 mg/dL (ref 100–199)
HDL: 81 mg/dL (ref >39)
LDL Chol Calc (NIH): 79 mg/dL (ref 0–99)
Triglycerides: 86 mg/dL (ref 0–149)
VLDL Cholesterol Cal: 16 mg/dL (ref 5–40)

## 2021-02-06 LAB — HEPATIC FUNCTION PANEL
ALT: 14 IU/L (ref 0–32)
AST: 18 IU/L (ref 0–40)
Albumin: 4.7 g/dL (ref 3.7–4.7)
Alkaline Phosphatase: 70 IU/L (ref 44–121)
Bilirubin Total: 0.7 mg/dL (ref 0.0–1.2)
Bilirubin, Direct: 0.21 mg/dL (ref 0.00–0.40)
Total Protein: 6.6 g/dL (ref 6.0–8.5)

## 2021-02-27 ENCOUNTER — Other Ambulatory Visit: Payer: Self-pay | Admitting: Family Medicine

## 2021-03-07 ENCOUNTER — Other Ambulatory Visit: Payer: Self-pay | Admitting: Family Medicine

## 2021-03-10 ENCOUNTER — Other Ambulatory Visit: Payer: Self-pay | Admitting: Family Medicine

## 2021-03-10 NOTE — Telephone Encounter (Signed)
Patient has not seen me in almost 1.5 years. Please set up visit. 90 days given since she did have bloodwork done through cardiology last month. p

## 2021-03-11 ENCOUNTER — Ambulatory Visit (INDEPENDENT_AMBULATORY_CARE_PROVIDER_SITE_OTHER): Payer: Medicare Other | Admitting: Family Medicine

## 2021-03-11 ENCOUNTER — Encounter: Payer: Self-pay | Admitting: Family Medicine

## 2021-03-11 ENCOUNTER — Other Ambulatory Visit: Payer: Self-pay

## 2021-03-11 VITALS — BP 110/62 | HR 84 | Temp 98.8°F | Ht 62.0 in | Wt 119.0 lb

## 2021-03-11 DIAGNOSIS — N390 Urinary tract infection, site not specified: Secondary | ICD-10-CM | POA: Diagnosis not present

## 2021-03-11 DIAGNOSIS — R319 Hematuria, unspecified: Secondary | ICD-10-CM

## 2021-03-11 MED ORDER — METOPROLOL TARTRATE 25 MG PO TABS: ORAL_TABLET | ORAL | 0 refills | Status: DC

## 2021-03-11 MED ORDER — NITROFURANTOIN MONOHYD MACRO 100 MG PO CAPS: 100.0000 mg | ORAL_CAPSULE | Freq: Two times a day (BID) | ORAL | 0 refills | Status: DC

## 2021-03-11 NOTE — Progress Notes (Signed)
   Subjective:    Patient ID: Joanne Hansen, female    DOB: 1945-09-03, 75 y.o.   MRN: [id number]  HPI Here for 3 days of urinary urgency and burning, and her urine has a foul odor. No back pain or nausea or fever. Drinking lots of water.    Review of Systems  Constitutional: Negative.   Respiratory: Negative.    Cardiovascular: Negative.   Gastrointestinal: Negative.   Genitourinary:  Positive for dysuria, frequency and urgency. Negative for flank pain and hematuria.      Objective:   Physical Exam Constitutional:      Appearance: Normal appearance. She is not ill-appearing.  Cardiovascular:     Rate and Rhythm: Normal rate and regular rhythm.     Pulses: Normal pulses.     Heart sounds: Normal heart sounds.  Pulmonary:     Effort: Pulmonary effort is normal.     Breath sounds: Normal breath sounds.  Abdominal:     General: Abdomen is flat. Bowel sounds are normal. There is no distension.     Palpations: Abdomen is soft. There is no mass.     Tenderness: There is no abdominal tenderness. There is no right CVA tenderness, left CVA tenderness, guarding or rebound.     Hernia: No hernia is present.  Neurological:     Mental Status: She is alert.          Assessment & Plan:  UTI, treat with Macrobid. Culture the sample.  Alysia Penna, MD

## 2021-03-11 NOTE — Telephone Encounter (Signed)
Spoke with the pt, informed her of the message below and scheduled an appt for 8/15 to arrive at 11:45am.

## 2021-03-13 LAB — URINE CULTURE
MICRO NUMBER:: 12142000
Result:: NO GROWTH
SPECIMEN QUALITY:: ADEQUATE

## 2021-04-03 ENCOUNTER — Other Ambulatory Visit: Payer: Self-pay

## 2021-04-06 ENCOUNTER — Encounter: Payer: Self-pay | Admitting: Family Medicine

## 2021-04-06 ENCOUNTER — Ambulatory Visit (INDEPENDENT_AMBULATORY_CARE_PROVIDER_SITE_OTHER): Payer: Medicare Other | Admitting: Family Medicine

## 2021-04-06 ENCOUNTER — Other Ambulatory Visit: Payer: Self-pay

## 2021-04-06 VITALS — BP 100/58 | HR 78 | Temp 98.9°F | Ht 62.0 in | Wt 118.7 lb

## 2021-04-06 DIAGNOSIS — G8929 Other chronic pain: Secondary | ICD-10-CM

## 2021-04-06 DIAGNOSIS — N941 Unspecified dyspareunia: Secondary | ICD-10-CM | POA: Diagnosis not present

## 2021-04-06 DIAGNOSIS — M5441 Lumbago with sciatica, right side: Secondary | ICD-10-CM | POA: Diagnosis not present

## 2021-04-06 MED ORDER — MELOXICAM 7.5 MG PO TABS: 7.5000 mg | ORAL_TABLET | Freq: Every day | ORAL | 0 refills | Status: DC

## 2021-04-06 NOTE — Progress Notes (Signed)
LARUE DEFARIA DOB: 09/27/1945 Encounter date: 04/06/2021  This is a 75 y.o. female who presents with Chief Complaint  Patient presents with   Follow-up    History of present illness:  Has been having physical pelvic therapy for dyspareunia. Also has back problems. One spot on outside of vagina that hurts, burns. Dr. Julien Girt was the one that sent her for therapy feeling there was muscular issue. Particular spot started after cancer treatment. She is is using vaginal dilators, vibrator, exercises. She hasn't followed up with Dr. Julien Girt since referral. Dr. Julien Girt examined her twice and couldn't see anything on skin. Has been in therapy since April.   Also getting dry needling for back. Has developed issue now in buttock area. That is giving her a lot of trouble too. Bending down to pick something up causes a lot of trouble. Getting radiation really through leg and around thigh. Chronic back issues; last year had several injections from Dr. Nelva Bush. She is managing to walk regularly.   Allergies  Allergen Reactions   Penicillins Swelling   Sulfa Antibiotics Nausea And Vomiting   Current Meds  Medication Sig   CALCIUM-VITAMIN D PO Take 2 capsules by mouth daily. Vit D 1000 with minerals   carboxymethylcellulose (REFRESH PLUS) 0.5 % SOLN 1 drop as needed.   Cyanocobalamin (VITAMIN B 12 PO) Take 1,000 mg by mouth daily. sublingal   methylcellulose oral powder Take by mouth every evening.    metoprolol tartrate (LOPRESSOR) 25 MG tablet TAKE 1/2 (ONE-HALF) TABLET BY MOUTH TWICE DAILY   simvastatin (ZOCOR) 40 MG tablet TAKE 1 TABLET BY MOUTH ONCE DAILY     Review of Systems  Constitutional:  Negative for chills, fatigue and fever.  Respiratory:  Negative for cough, chest tightness, shortness of breath and wheezing.   Cardiovascular:  Negative for chest pain, palpitations and leg swelling.  Genitourinary:  Positive for dyspareunia and vaginal pain (with intercourse). Negative for genital  sores and vaginal discharge.  Musculoskeletal:  Positive for arthralgias (right hip) and back pain.   Objective:  BP (!) 100/58 (BP Location: Left Arm, Patient Position: Sitting, Cuff Size: Normal)   Pulse 78   Temp 98.9 F (37.2 C) (Rectal)   Ht '5\' 2"'$  (1.575 m)   Wt 118 lb 11.2 oz (53.8 kg)   BMI 21.71 kg/m   Weight: 118 lb 11.2 oz (53.8 kg)   BP Readings from Last 3 Encounters:  04/06/21 (!) 100/58  03/11/21 110/62  01/20/21 120/70   Wt Readings from Last 3 Encounters:  04/06/21 118 lb 11.2 oz (53.8 kg)  03/11/21 119 lb (54 kg)  01/20/21 118 lb 6.4 oz (53.7 kg)    Physical Exam Constitutional:      General: She is not in acute distress.    Appearance: She is well-developed.  Cardiovascular:     Rate and Rhythm: Normal rate and regular rhythm.     Heart sounds: Normal heart sounds. No murmur heard.   No friction rub.  Pulmonary:     Effort: Pulmonary effort is normal. No respiratory distress.     Breath sounds: Normal breath sounds. No wheezing or rales.  Musculoskeletal:     Right lower leg: No edema.     Left lower leg: No edema.  Neurological:     Mental Status: She is alert and oriented to person, place, and time.  Psychiatric:        Behavior: Behavior normal.    Assessment/Plan  1. Chronic midline low back pain  with right-sided sciatica She is working with PT currently; discussed icing and great toe stretch/IT band stretch to add to other exercises. She has had benefit from DO treatment in past and would consider if not improving. Trial mobic daily x 2 weeks with stretching, icing hip. If not improved with this she will let me know.   2. Dyspareunia in female Post menopausal and post chemo. She has worked with pelvic floor therapist for months without improvement. I am going to reach out to gyn as well as oncology re their opinions on further treatment. I suspect she would have some improvement with topical estrogen cream but will get their input and include  patient in discussion re risks/benefits.   Return if symptoms worsen or fail to improve.     Micheline Rough, MD

## 2021-04-07 ENCOUNTER — Other Ambulatory Visit: Payer: Self-pay | Admitting: Oncology

## 2021-04-13 ENCOUNTER — Ambulatory Visit
Admission: RE | Admit: 2021-04-13 | Discharge: 2021-04-13 | Disposition: A | Discharge status: Home / Self Care | Payer: Medicare Other | Source: Ambulatory Visit | Attending: Oncology | Admitting: Oncology

## 2021-04-13 ENCOUNTER — Other Ambulatory Visit: Payer: Self-pay

## 2021-04-13 DIAGNOSIS — Z171 Estrogen receptor negative status [ER-]: Secondary | ICD-10-CM

## 2021-04-13 DIAGNOSIS — C50211 Malignant neoplasm of upper-inner quadrant of right female breast: Secondary | ICD-10-CM

## 2021-04-16 ENCOUNTER — Telehealth: Payer: Self-pay

## 2021-04-16 NOTE — Telephone Encounter (Signed)
Patient called requesting physical therapy referral and also would like Dr. Elesa Massed to know how she is doing taking new medication

## 2021-04-17 ENCOUNTER — Other Ambulatory Visit: Payer: Self-pay | Admitting: Family Medicine

## 2021-04-17 DIAGNOSIS — M545 Low back pain, unspecified: Secondary | ICD-10-CM

## 2021-04-17 DIAGNOSIS — M25551 Pain in right hip: Secondary | ICD-10-CM

## 2021-04-17 NOTE — Telephone Encounter (Signed)
Patient didn't note improvement with meloxicam. Ok to stop this. She needs referral to see additional therapist at same location as pelvic floor therapy - they suggested seeing someone between pelvic floor sessions to work on lower back and hip. This is reasonable given her lack of improvement with pelvic therapy alone. Referral has been placed.

## 2021-04-20 ENCOUNTER — Encounter: Payer: Self-pay | Admitting: Family Medicine

## 2021-05-20 ENCOUNTER — Ambulatory Visit (HOSPITAL_COMMUNITY)
Admission: RE | Admit: 2021-05-20 | Discharge: 2021-05-20 | Disposition: A | Discharge status: Home / Self Care | Payer: Medicare Other | Source: Ambulatory Visit | Attending: Oncology | Admitting: Oncology

## 2021-05-20 ENCOUNTER — Encounter: Payer: Self-pay | Admitting: Oncology

## 2021-05-20 ENCOUNTER — Other Ambulatory Visit (HOSPITAL_COMMUNITY): Payer: Self-pay

## 2021-05-20 ENCOUNTER — Inpatient Hospital Stay: Payer: Medicare Other | Attending: Oncology

## 2021-05-20 ENCOUNTER — Other Ambulatory Visit: Payer: Self-pay

## 2021-05-20 ENCOUNTER — Inpatient Hospital Stay (HOSPITAL_BASED_OUTPATIENT_CLINIC_OR_DEPARTMENT_OTHER): Payer: Medicare Other | Admitting: Oncology

## 2021-05-20 ENCOUNTER — Encounter: Payer: Self-pay | Admitting: Adult Health

## 2021-05-20 VITALS — BP 114/65 | HR 69 | Temp 97.7°F | Resp 18 | Ht 62.0 in | Wt 118.0 lb

## 2021-05-20 DIAGNOSIS — C50411 Malignant neoplasm of upper-outer quadrant of right female breast: Secondary | ICD-10-CM | POA: Diagnosis present

## 2021-05-20 DIAGNOSIS — C50211 Malignant neoplasm of upper-inner quadrant of right female breast: Secondary | ICD-10-CM

## 2021-05-20 DIAGNOSIS — M549 Dorsalgia, unspecified: Secondary | ICD-10-CM | POA: Insufficient documentation

## 2021-05-20 DIAGNOSIS — Z171 Estrogen receptor negative status [ER-]: Secondary | ICD-10-CM

## 2021-05-20 DIAGNOSIS — Z9221 Personal history of antineoplastic chemotherapy: Secondary | ICD-10-CM | POA: Insufficient documentation

## 2021-05-20 DIAGNOSIS — N952 Postmenopausal atrophic vaginitis: Secondary | ICD-10-CM | POA: Insufficient documentation

## 2021-05-20 LAB — CBC WITH DIFFERENTIAL/PLATELET
Abs Immature Granulocytes: 0.01 10*3/uL (ref 0.00–0.07)
Basophils Absolute: 0.1 10*3/uL (ref 0.0–0.1)
Basophils Relative: 1 %
Eosinophils Absolute: 0.1 10*3/uL (ref 0.0–0.5)
Eosinophils Relative: 2 %
HCT: 38 % (ref 36.0–46.0)
Hemoglobin: 13.3 g/dL (ref 12.0–15.0)
Immature Granulocytes: 0 %
Lymphocytes Relative: 28 %
Lymphs Abs: 1.6 10*3/uL (ref 0.7–4.0)
MCH: 34.5 pg — ABNORMAL HIGH (ref 26.0–34.0)
MCHC: 35 g/dL (ref 30.0–36.0)
MCV: 98.7 fL (ref 80.0–100.0)
Monocytes Absolute: 0.5 10*3/uL (ref 0.1–1.0)
Monocytes Relative: 9 %
Neutro Abs: 3.4 10*3/uL (ref 1.7–7.7)
Neutrophils Relative %: 60 %
Platelets: 205 10*3/uL (ref 150–400)
RBC: 3.85 MIL/uL — ABNORMAL LOW (ref 3.87–5.11)
RDW: 12 % (ref 11.5–15.5)
WBC: 5.6 10*3/uL (ref 4.0–10.5)
nRBC: 0 % (ref 0.0–0.2)

## 2021-05-20 LAB — COMPREHENSIVE METABOLIC PANEL
ALT: 16 U/L (ref 0–44)
AST: 20 U/L (ref 15–41)
Albumin: 4.2 g/dL (ref 3.5–5.0)
Alkaline Phosphatase: 61 U/L (ref 38–126)
Anion gap: 11 (ref 5–15)
BUN: 13 mg/dL (ref 8–23)
CO2: 25 mmol/L (ref 22–32)
Calcium: 10.1 mg/dL (ref 8.9–10.3)
Chloride: 104 mmol/L (ref 98–111)
Creatinine, Ser: 0.79 mg/dL (ref 0.44–1.00)
GFR, Estimated: >60 mL/min (ref >60)
Glucose, Bld: 91 mg/dL (ref 70–99)
Potassium: 4.1 mmol/L (ref 3.5–5.1)
Sodium: 140 mmol/L (ref 135–145)
Total Bilirubin: 0.7 mg/dL (ref 0.3–1.2)
Total Protein: 7.1 g/dL (ref 6.5–8.1)

## 2021-05-20 MED ORDER — ESTRING 2 MG VA RING
2.0000 mg | VAGINAL_RING | VAGINAL | 12 refills | Status: DC
Start: 2021-05-20 — End: 2021-05-28
  Filled 2021-05-20: qty 1, 90d supply, fill #0

## 2021-05-20 NOTE — Progress Notes (Signed)
[place]  [phone] [phone]    ID: Joanne Hansen DOB: 1946-06-03  MR#: [id number]  TSV#:[id number]  Patient Care Team: Caren Macadam, MD as PCP - General (Family Medicine) Lorretta Harp, MD as PCP - Cardiology (Cardiology) Floyed Masoud, Virgie Dad, MD as Consulting Physician (Oncology) Erroll Luna, MD as Consulting Physician (General Surgery) Kyung Rudd, MD as Consulting Physician (Radiation Oncology) Leighton Ruff, MD as Consulting Physician (General Surgery) Bjorn Loser, MD as Consulting Physician (Urology) Suella Broad, MD as Consulting Physician (Physical Medicine and Rehabilitation) OTHER MD:  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 She is under observation.  Since her last visit, she underwent bilateral diagnostic mammography with tomography at [facility] on 04/13/2021 showing: breast density category B; no evidence of malignancy in either breast.    REVIEW OF SYSTEMS: Joanne Hansen had some concerns.  She has low back pain and this is fairly persistent although not progressive.  It does not radiate.  It is not clearly better when she exercises.  The main problem she is having is severe vaginal dryness with dyspareunia.  They have considered vitamin E cream but have not started that.  They have undergone a physical therapy program for the pelvis suggested by Dr. Orvan Seen and got no relief.  Otherwise they are doing generally well and recently went to a [employer] and other activities that they mutually enjoyed.  Detailed review of systems was otherwise stable   COVID 19 VACCINATION STATUS: [place] x3, most recently 06/2020; infection 11/2020   HISTORY OF CURRENT ILLNESS: From the original intake note:  Joanne Hansen had routine  screening mammography on 04/10/2019 showing a possible abnormality in the right breast. She underwent unilateral right diagnostic mammography with tomography and right breast ultrasonography at [facility] on 04/12/2019 showing: Breast Density Category B. Spot compression views of the medial right breast, including tomography confirm an irregular mass spanning approximately 0.9 cm. On physical exam, no mass is palpated in the upper inner quadrant of the right breast. Targeted ultrasound is performed, showing a single irregular mass versus two immediately adjacent small irregular masses, that in total measure 1.1 cm. Individually, the two irregular masses measure 0.8 x 0.4 x 0.5 cm and 0.4 x 0.4 x 0.4 cm, respectively. Mass(es) are at 1 o'clock position 3 cm from the nipple. Ultrasound of the right axilla is negative for lymphadenopathy.  Accordingly on 04/16/2019 she proceeded to biopsy of the right breast area in question. The pathology from this procedure showed ([id number]): invasive ductal carcinoma, grade II. Prognostic indicators significant for: estrogen receptor, 0% negative and progesterone receptor, 0% negative,. Proliferation marker Ki67 at 20%. HER2 negative (1+) by immunohistochemistry.  The patient's subsequent history is as detailed below.   PAST MEDICAL HISTORY: Past Medical History:  Diagnosis Date   Arthritis    Breast cancer ([place])    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   Family history of brain cancer    Family history of breast cancer    Family history of colon cancer    Fecal incontinence    05-16-2019 per pt only a little leakage but controllable (was in clinical trial for [facility] without benefit.)   Frequent headaches    History of syncope    pre-syncope  08/ 2015  per pt no issue with this since  Hyperlipidemia    Malignant neoplasm of upper-inner quadrant of right breast in female, estrogen receptor negative [facility]) oncologist-- dr Jana Hakim  dr moody   dx  08/ 2020--  invasive ductal carcinoma,  05-13-2019  s/p right breast lumpectomy    PAF (paroxysmal atrial fibrillation) ([place]) (05-16-2019   pt from [place], recently moved to [place] to be near daughter, has not established a cardiology yet--  previously seen by dr Lowella Dandy cossu (last office note dated 02-28-2018 scanned in epic)---  echo 04-15-2014  ef 55-60%, G1DD mild AR without stenosis, RVSp 64mHg   Personal history of radiation therapy    stopped 09/22/19    Rectal cyst    Thyroid goiter    pt ultrasound in epic 06-04-2014 , nodules, no bx done   Urinary incontinence, mixed     PAST SURGICAL HISTORY: Past Surgical History:  Procedure Laterality Date   ABDOMINAL HYSTERECTOMY  1978   w/  RSO  and APPENDECTOMY   BREAST BIOPSY Right 08/2009   benign   BREAST LUMPECTOMY WITH RADIOACTIVE SEED AND SENTINEL LYMPH NODE BIOPSY Right 05/15/2019   Procedure: RIGHT BREAST LUMPECTOMY WITH RADIOACTIVE SEED AND SENTINEL LYMPH NODE MAPPING;  Surgeon: CErroll Luna MD;  [facility]  Service: General;  Laterality: Right;   BREAST SURGERY  2011   biospy of right breast   CATARACT EXTRACTION [place] BILATERAL  2004   COLONOSCOPY  11/21/2019   FLEXIBLE SIGMOIDOSCOPY N/A 05/18/2019   Procedure: FLEXIBLE SIGMOIDOSCOPY,  TRANSANAL EXCISION inclusion cyst;  Surgeon: TLeighton Ruff MD;  [facility]  Service: General;  Laterality: N/A;   gluteus medius repair  2017   LUMBAR LAMINECTOMY  2013   L5 -- S1   PORT-A-CATH REMOVAL Right 09/27/2019   Procedure: PORT REMOVAL;  Surgeon: CErroll Luna MD;  [facility] [facility]  Service: General;  Laterality: Right;   PORTACATH PLACEMENT Right 05/15/2019   Procedure: INSERTION PORT-A-CATH WITH ULTRASOUND;  Surgeon: CErroll Luna MD;  [facility]  Service: General;  Laterality: Right;   ROTATOR CUFF REPAIR Left 2001    FAMILY HISTORY: Family History   Problem Relation Age of Onset   COPD Mother    Cancer Mother 680      colon   Arthritis Mother    Hypertension Mother    Miscarriages / SKoreaMother    Stroke Mother 768  Alzheimer's disease Mother 860  Heart disease Father    Heart attack Father 232  Lung disease Father    Arthritis Sister    Breast cancer Sister 740      Lumpectomy   Diabetes Maternal Grandmother    Breast cancer Niece 369      Louise's Daughter   Cervical cancer Niece 440 The patient's father died at age 6964from a heart attack.  The patient's mother died at age 73 she had a gastrointestinal cancer diagnosed age 73  The patient has 1 sister with breast cancer and one niece with breast cancer diagnosed at age 73   GYNECOLOGIC HISTORY:  No LMP recorded. Patient has had a hysterectomy. 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  Her husband GGermain Hansen(goes by "[employer]) is  a retired Chief Financial Officer.  Their daughter Joanne Hansen (currently divorced) lives in [place].  The patient has no grandchildren.  She is not a church attender   ADVANCED DIRECTIVES: The patient's husband is her healthcare power of attorney   HEALTH MAINTENANCE: Social History   Tobacco Use   Smoking status: Never   Smokeless tobacco: Never  Vaping Use   Vaping Use: Never used  Substance Use Topics   Alcohol use: Yes    Comment: Cocktails-3 days per week   Drug use: Never    Types: Amphetamines    Colonoscopy: March 2021/  PAP: Status post hysterectomy  Bone density: Pending   Allergies  Allergen Reactions   Penicillins Swelling   Sulfa Antibiotics Nausea And Vomiting     OBJECTIVE: white woman who appears well  Vitals:   05/20/21 1353  BP: 114/65  Pulse: 69  Resp: 18   Temp: 97.7 F (36.5 C)  SpO2: 100%    Wt Readings from Last 3 Encounters:  05/20/21 118 lb (53.5 kg)  04/06/21 118 lb 11.2 oz (53.8 kg)  03/11/21 119 lb (54 kg)   Body mass index is 21.58 kg/m.    ECOG FS:1 - Symptomatic but completely ambulatory   Sclerae unicteric, EOMs intact Wearing a mask No cervical or supraclavicular adenopathy Lungs no rales or rhonchi Heart regular rate and rhythm Abd soft, nontender, positive bowel sounds MSK no focal spinal tenderness, no upper extremity lymphedema Neuro: nonfocal, well oriented, appropriate affect Breasts: The right breast has undergone surgery and radiation with overall a good cosmetic result, mild contour distortion.  The left breast and both axillae are benign  LAB RESULTS:  CMP     Component Value Date/Time   NA 140 05/20/2021 1317   K 4.1 05/20/2021 1317   CL 104 05/20/2021 1317   CO2 25 05/20/2021 1317   GLUCOSE 91 05/20/2021 1317   BUN 13 05/20/2021 1317   CREATININE 0.79 05/20/2021 1317   CREATININE 0.77 05/07/2019 1547   CALCIUM 10.1 05/20/2021 1317   PROT 7.1 05/20/2021 1317   PROT 6.6 02/06/2021 0845   ALBUMIN 4.2 05/20/2021 1317   ALBUMIN 4.7 02/06/2021 0845   AST 20 05/20/2021 1317   AST 19 05/07/2019 1547   ALT 16 05/20/2021 1317   ALT 13 05/07/2019 1547   ALKPHOS 61 05/20/2021 1317   BILITOT 0.7 05/20/2021 1317   BILITOT 0.7 02/06/2021 0845   BILITOT 0.5 05/07/2019 1547   GFRNONAA >60 05/20/2021 1317   GFRNONAA >60 05/07/2019 1547   GFRAA >60 05/21/2020 1413   GFRAA >60 05/07/2019 1547    No results found for: TOTALPROTELP, ALBUMINELP, A1GS, A2GS, BETS, BETA2SER, GAMS, MSPIKE, SPEI  No results found for: KPAFRELGTCHN, LAMBDASER, KAPLAMBRATIO  Lab Results  Component Value Date   WBC 5.6 05/20/2021   NEUTROABS 3.4 05/20/2021   HGB 13.3 05/20/2021   HCT 38.0 05/20/2021   MCV 98.7 05/20/2021   PLT 205 05/20/2021    No results found for: LABCA2  No components found for: DYNXGZ358  No  results for input(s): INR in the last 168 hours.  No results found for: LABCA2  No results found for: IPP898  No results found for: MKJ031  No results found for: YOF188  No results found for: CA2729  No components found for: HGQUANT  No results found for: CEA1 / No results found for: CEA1   No results found for: AFPTUMOR  No results found for: CHROMOGRNA  No results found for: HGBA, HGBA2QUANT, HGBFQUANT, HGBSQUAN (  Hemoglobinopathy evaluation)   No results found for: LDH  Lab Results  Component Value Date   IRON 117 12/20/2019   TIBC 298 12/20/2019   IRONPCTSAT 39 12/20/2019   (Iron and TIBC)  Lab Results  Component Value Date   FERRITIN 92 12/20/2019    Urinalysis    Component Value Date/Time   BILIRUBINUR Negative 02/27/2020 1123   PROTEINUR Negative 02/27/2020 1123   UROBILINOGEN 0.2 02/27/2020 1123   NITRITE Negative 02/27/2020 1123   LEUKOCYTESUR Trace (A) 02/27/2020 1123    STUDIES:  No results found.   ELIGIBLE FOR AVAILABLE RESEARCH PROTOCOL: NO   ASSESSMENT: 75 y.o. [place], [place] woman status post right breast upper quadrant biopsy for an mT1b (or single T1c)  invasive ductal carcinoma, grade 2, triple negative, with an MIB-1-1 of 20%.  (1) Genetic testing on 05/07/2019 through the [facility] Breast Cancer STAT Panel + Common Hereditary cancer panel found no pathogenic mutations. The STAT Breast cancer panel offered by [facility] includes sequencing and rearrangement analysis for the following 9 genes:  ATM, BRCA1, BRCA2, CDH1, CHEK2, PALB2, PTEN, STK11 and TP53.  The Common Hereditary Cancers Panel offered by [facility] includes sequencing and/or deletion duplication testing of the following 47 genes: APC, ATM, AXIN2, BARD1, BMPR1A, BRCA1, BRCA2, BRIP1, CDH1, CDKN2A (p14ARF), CDKN2A (p16INK4a), CKD4, CHEK2, CTNNA1, DICER1, EPCAM (Deletion/duplication testing only), GREM1 (promoter region deletion/duplication testing only), KIT, MEN1, MLH1, MSH2, MSH3, MSH6,  MUTYH, NBN, NF1, NHTL1, PALB2, PDGFRA, PMS2, POLD1, POLE, PTEN, RAD50, RAD51C, RAD51D, SDHB, SDHC, SDHD, SMAD4, SMARCA4. STK11, TP53, TSC1, TSC2, and VHL.  The following genes were evaluated for sequence changes only: SDHA and HOXB13 c.251G>A variant only.  (2) s/p right lumpectomy and sentinel lymph node sampling 05/15/2019 for a pT1b pN0 stage IB invasive ductal carcinoma, grade 3, with negative margins.  (a) a total of 2 sentinel lymph nodes were removed  (3) adjuvant chemotherapy consisting of cyclophosphamide and docetaxel every 21 days x 4, started 05/29/2019, completed 07/31/2019  (4) adjuvant radiation 08/27/2019 - 09/21/2019 The right breast was treated to 42.56 Gy in 16 fractions followed by an 8 Gy boost to the surgical cavity.  (5) opted against prophylactic antiestrogens  (a) bone density 04/10/2020 shows a T score of -1.9  (6) severe vaginal atrophy with dyspareunia  (a) Estring prescribed 05/20/2021   PLAN: Joanne Hansen is now 2 years out from definitive surgery for her breast cancer with no evidence of disease recurrence.  This is particularly favorable because estrogen receptor negative tumors if they are going to recur tend to recur early.  I think her back pain is likely going to be osteoarthritis.  She had lumbar spine films 2017 and I gave her a copy.  This showed mild degenerative disease.  I put her in for repeat spine films today and anticipate a similar reading.  Those films have been done but not yet evaluated.  She is interested in possibly epidural shots and I gave her suggestions on how she may schedule a consult to obtain these.  The vaginal atrophy issue is more complex.  We simply do not have any useful data to tell us whether it is safe or unsafe for her to use vaginal estrogens.  We cannot extrapolate from studies with 20 patients followed for 1year or molecular studies finding that the absorption of estrogen is only so much transvaginally.  We simply do not know if  it is safe or not for her to do this.  We also do not know that it  is not safe.  If she can accept this uncertainty, then I think for quality of life my suggestion is that she try Estring for 3 months.  If she finds a great deal of relief then she will have the option of going off that and trying to maintain it with vitamin E cream, or continuing indefinitely.  If she is going to continue it indefinitely she might also consider going on tamoxifen low-dose perhaps 10 mg daily and we actually do use the combination of vaginal estrogens and tamoxifen regularly and consider it safe.  Tamoxifen of course has its own set of risks including blood clots.  After much discussion what she would like to do is consider Estring.  I wrote her the prescription and I will review results with her after 6 to 8 weeks to assess effectiveness.  Total encounter time 40 minutes.Sarajane Jews C. Kayleen Alig, MD  05/20/21 5:12 PM Medical Oncology and Hematology [facility] [place], [place] [address] Tel. [phone]    Fax. [phone]    I, Wilburn Mylar, am acting as scribe for Dr. Virgie Dad. Jacere Pangborn.  I, Lurline Del MD, have reviewed the above documentation for accuracy and completeness, and I agree with the above.   *Total Encounter Time as defined by the Centers for Medicare and Medicaid Services includes, in addition to the face-to-face time of a patient visit (documented in the note above) non-face-to-face time: obtaining and reviewing outside history, ordering and reviewing medications, tests or procedures, care coordination (communications with other health care professionals or caregivers) and documentation in the medical record.

## 2021-05-21 IMAGING — MG MM CLIP PLACEMENT
4 series · 4 of 12 positions shown · non-contrast
Comparison: Previous exam(s).

CLINICAL DATA: Evaluate biopsy marker

EXAM:
DIAGNOSTIC RIGHT MAMMOGRAM POST ULTRASOUND BIOPSY

[R ML synth-2D]
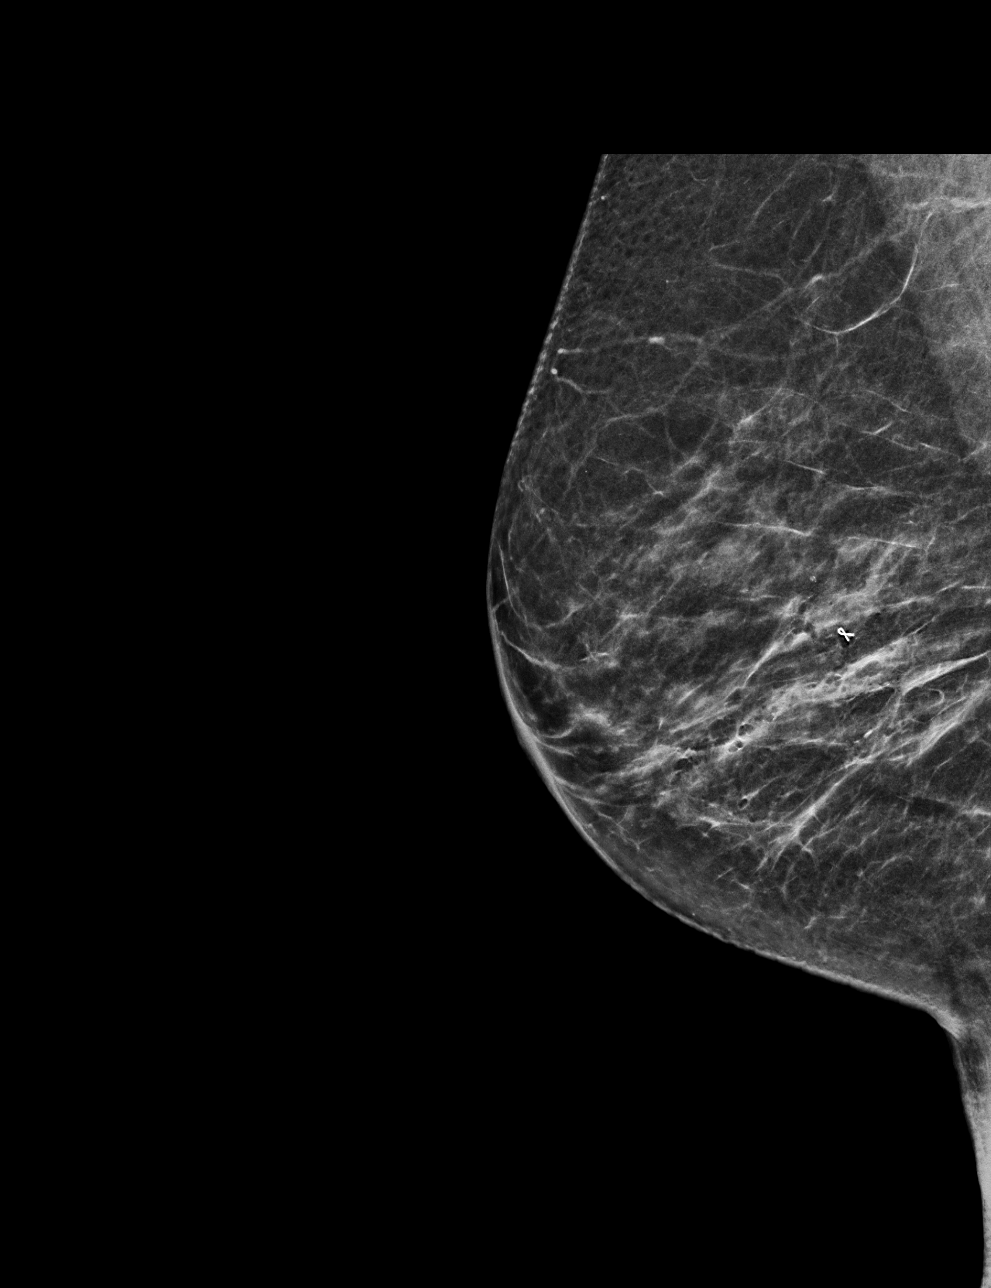

[R CC synth-2D]
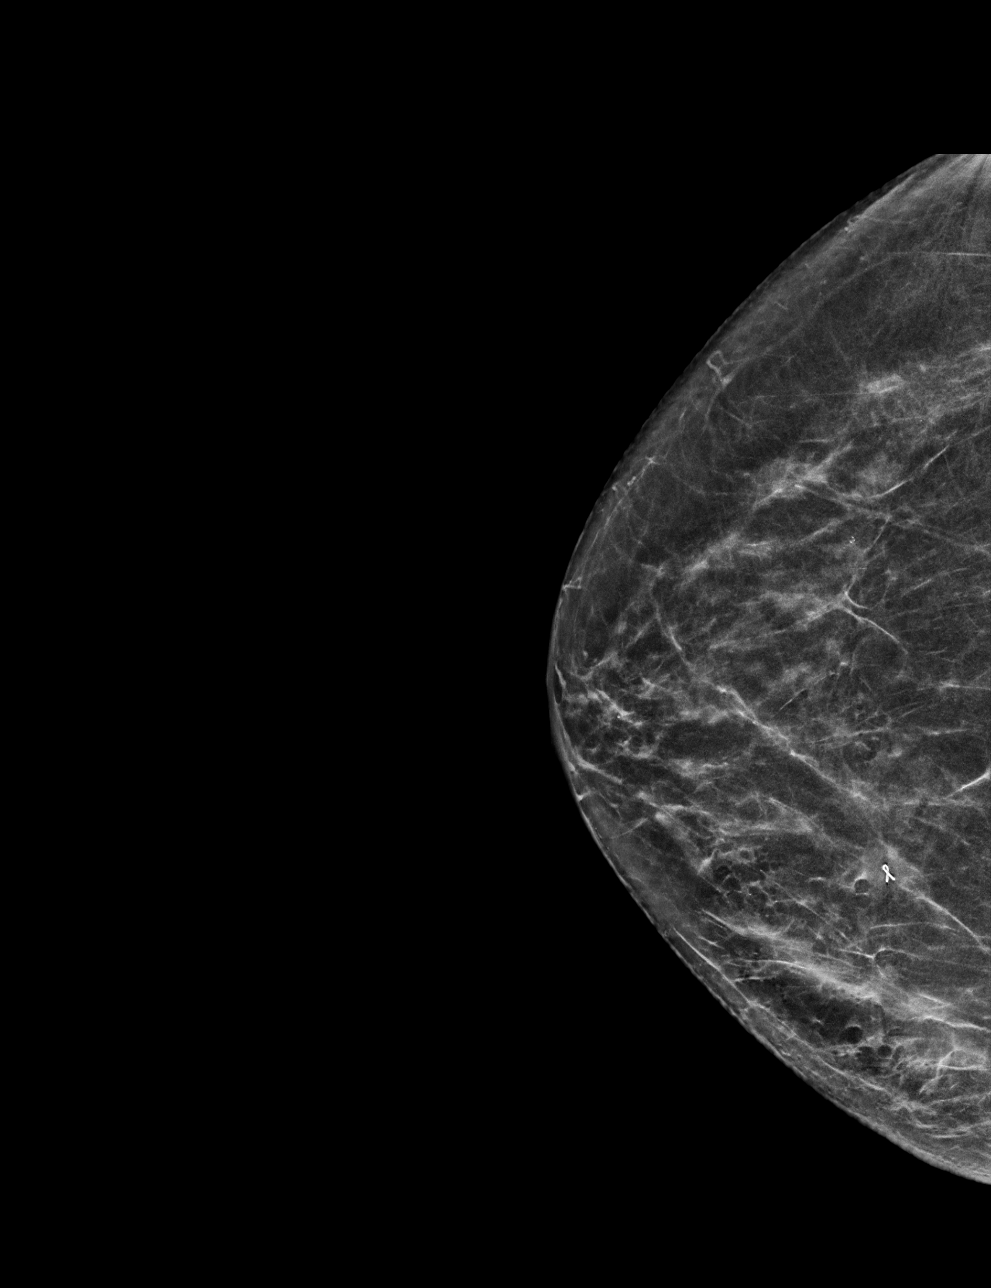

[R ML tomo · tomo slice 29/57.0]
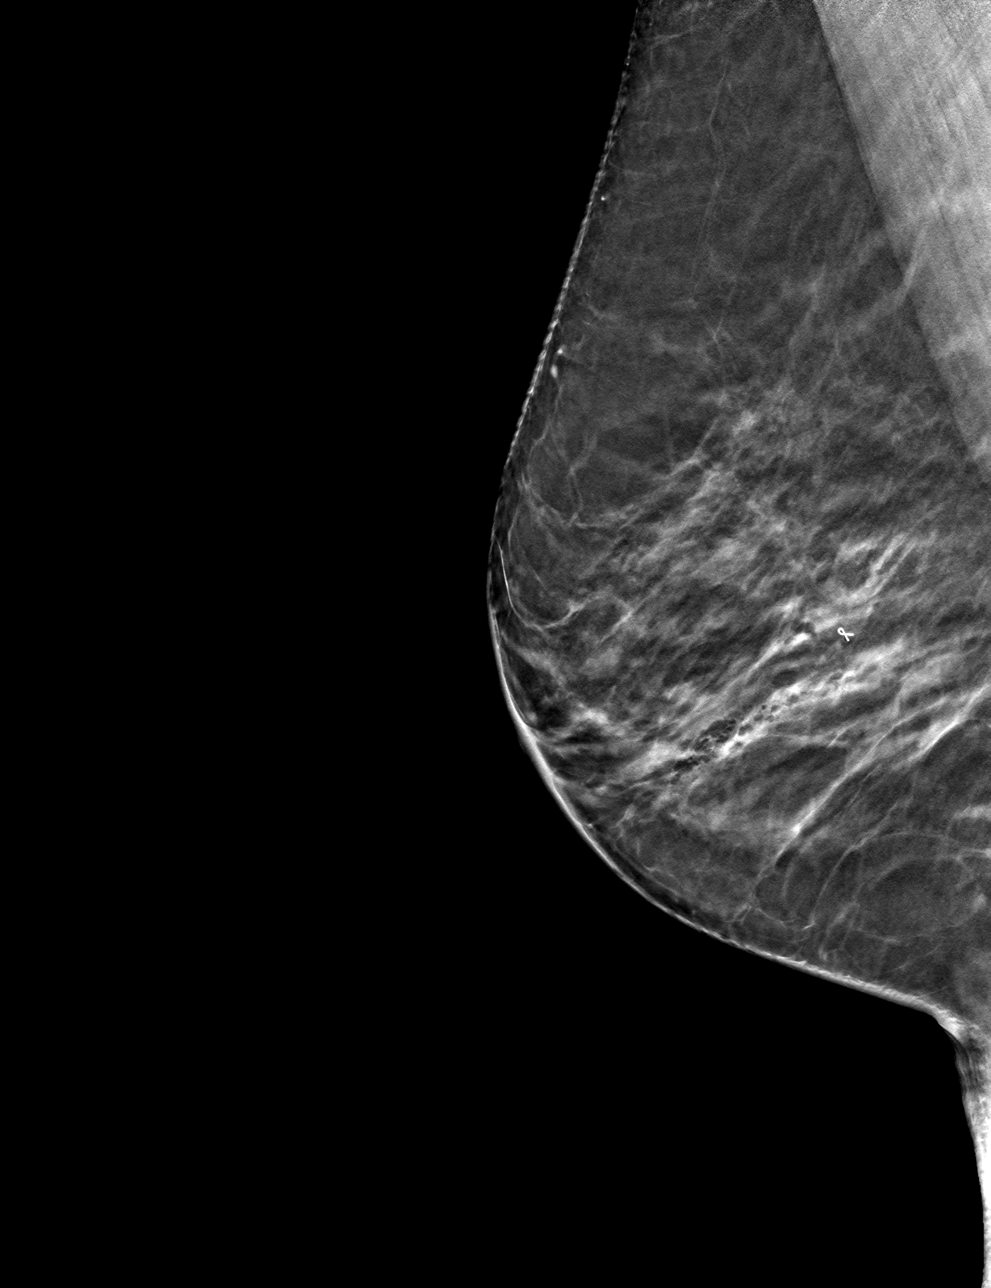

[R CC tomo · tomo slice 29/57.0]
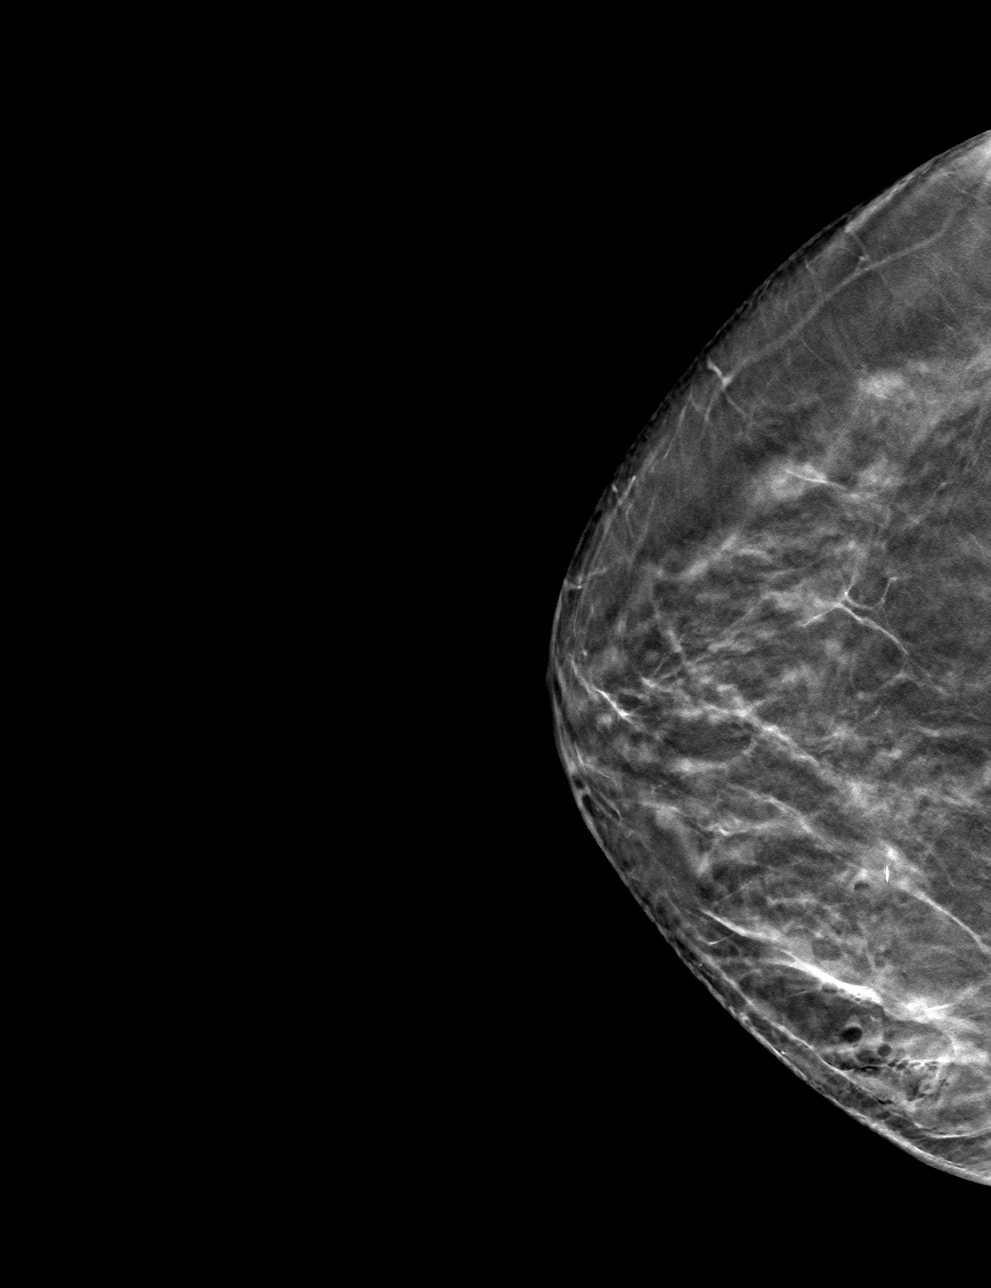

[4 of 12 positions shown; findings below may reference images not displayed]

FINDINGS: Mammographic images were obtained following ultrasound guided biopsy
of a right breast mass. The ribbon shaped clip is in good position.
IMPRESSION: The ribbon shaped clip is in good position.

Final Assessment: Post Procedure Mammograms for Marker Placement

## 2021-05-23 ENCOUNTER — Encounter: Payer: Self-pay | Admitting: Oncology

## 2021-05-28 ENCOUNTER — Telehealth: Payer: Self-pay | Admitting: *Deleted

## 2021-05-28 ENCOUNTER — Other Ambulatory Visit (HOSPITAL_COMMUNITY): Payer: Self-pay

## 2021-05-28 DIAGNOSIS — M545 Low back pain, unspecified: Secondary | ICD-10-CM

## 2021-05-28 DIAGNOSIS — Z9889 Other specified postprocedural states: Secondary | ICD-10-CM

## 2021-05-28 DIAGNOSIS — M549 Dorsalgia, unspecified: Secondary | ICD-10-CM

## 2021-05-28 DIAGNOSIS — G8929 Other chronic pain: Secondary | ICD-10-CM

## 2021-05-28 MED ORDER — ESTRADIOL 10 MCG VA TABS
ORAL_TABLET | VAGINAL | 1 refills | Status: DC
  Filled 2021-05-28: qty 8, 28d supply, fill #0

## 2021-06-03 ENCOUNTER — Other Ambulatory Visit: Payer: Self-pay | Admitting: Family Medicine

## 2021-06-04 ENCOUNTER — Telehealth: Payer: Self-pay | Admitting: *Deleted

## 2021-06-04 NOTE — Telephone Encounter (Signed)
This RN spoke with pt per follow up on prescription for vaginal estrogen - which all forms cost between $ 300-500 out of pocket cost to the patient.  Per discussion she states she has not tried internal vaginal moisturizers - she has used lubricants but not " moisturizer"- this RN informed her of Replens which is at cost of $12.50 for a month's supply.  Pt will try the above as well as this RN will send note to her GYN for possible other internal moisturizer recommendations.

## 2021-06-16 ENCOUNTER — Encounter: Payer: Self-pay | Admitting: Adult Health

## 2021-06-16 ENCOUNTER — Encounter: Payer: Self-pay | Admitting: Oncology

## 2021-06-16 NOTE — Telephone Encounter (Signed)
No entry 

## 2021-06-19 IMAGING — DX MM BREAST SURGICAL SPECIMEN
1 series · 2 of 2 positions shown · non-contrast
Comparison: Previous exam(s).

CLINICAL DATA: Radioactive seed placement of the right breast was
performed May 14, 2019 prior to lumpectomy.

EXAM:
SPECIMEN RADIOGRAPH OF THE RIGHT BREAST

[Series 2: specimen digital x-ray, derived · right · 2 of 2 slices shown]
[im 1/2]
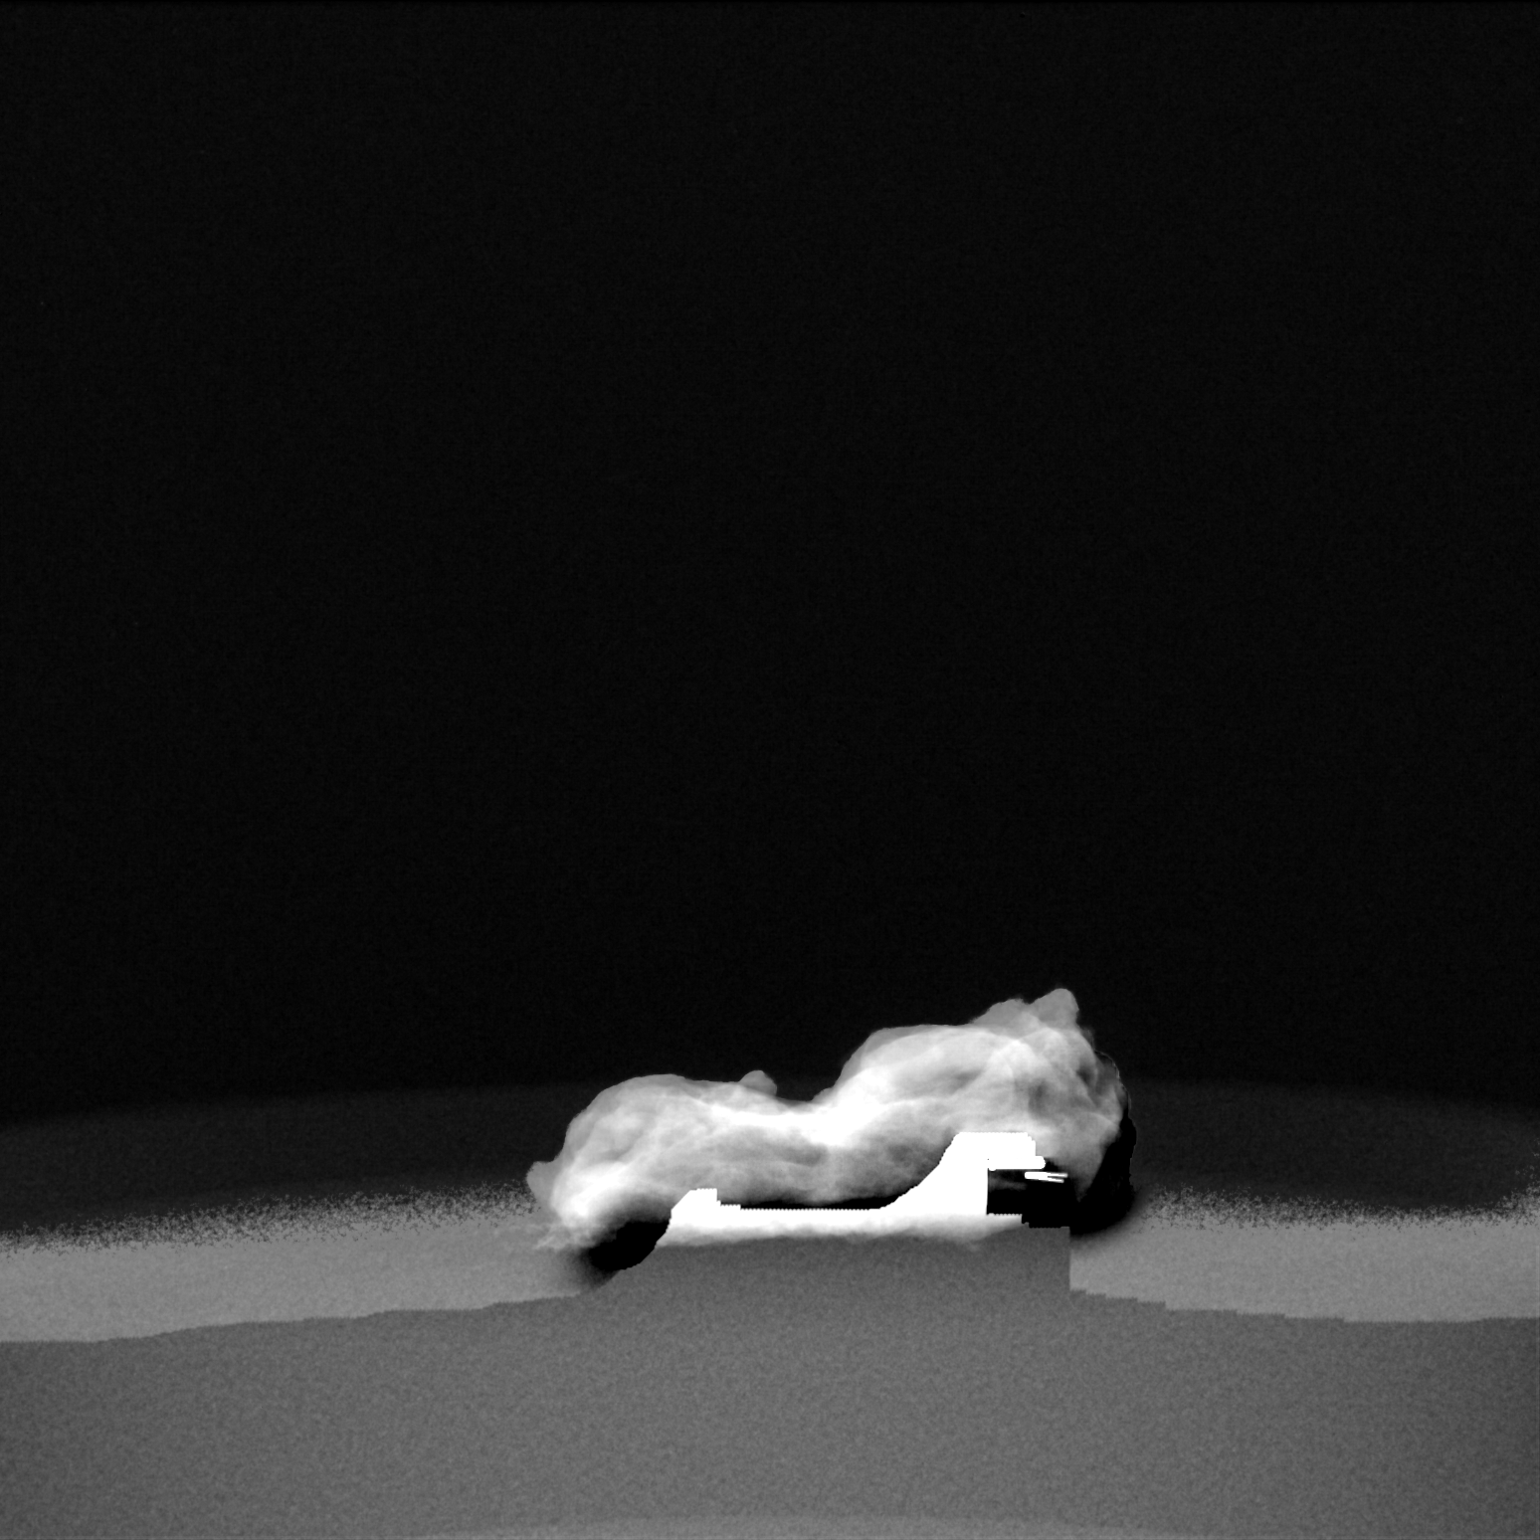
[im 2/2]
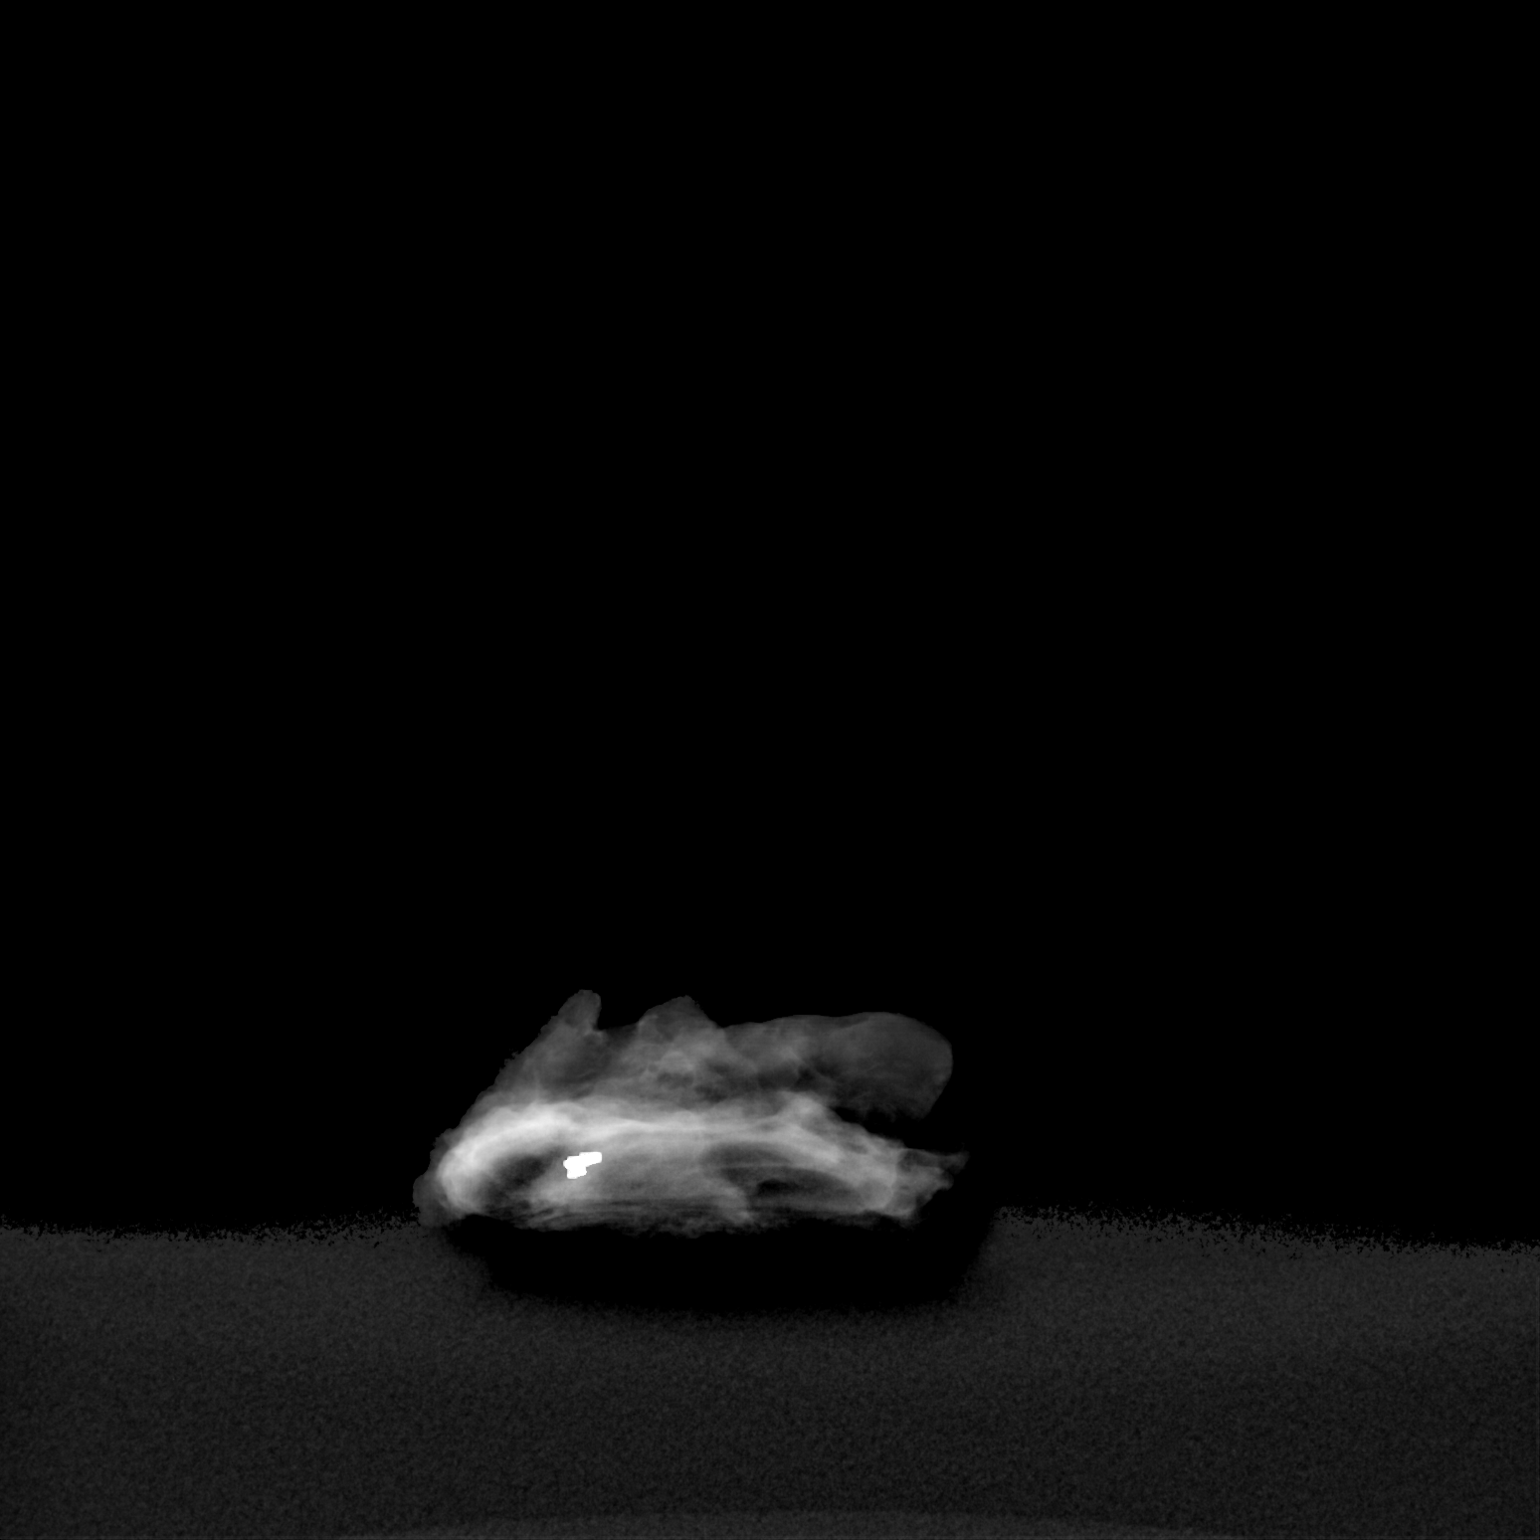

[2 of 2 positions shown; findings below may reference images not displayed]

FINDINGS: Status post excision of the right breast. The radioactive seed and
biopsy marker clip are present, completely intact, and were marked
for pathology.
IMPRESSION: Specimen radiograph of the right breast.

## 2021-06-25 ENCOUNTER — Other Ambulatory Visit: Payer: Self-pay | Admitting: Family Medicine

## 2021-07-08 ENCOUNTER — Other Ambulatory Visit: Payer: Self-pay

## 2021-07-08 ENCOUNTER — Inpatient Hospital Stay: Payer: Medicare Other | Attending: Oncology | Admitting: Oncology

## 2021-07-08 VITALS — BP 117/71 | HR 81 | Temp 97.7°F | Resp 18 | Ht 62.0 in | Wt 120.5 lb

## 2021-07-08 DIAGNOSIS — Z9221 Personal history of antineoplastic chemotherapy: Secondary | ICD-10-CM | POA: Insufficient documentation

## 2021-07-08 DIAGNOSIS — N952 Postmenopausal atrophic vaginitis: Secondary | ICD-10-CM | POA: Insufficient documentation

## 2021-07-08 DIAGNOSIS — Z171 Estrogen receptor negative status [ER-]: Secondary | ICD-10-CM | POA: Diagnosis not present

## 2021-07-08 DIAGNOSIS — C50211 Malignant neoplasm of upper-inner quadrant of right female breast: Secondary | ICD-10-CM | POA: Diagnosis not present

## 2021-07-08 DIAGNOSIS — Z923 Personal history of irradiation: Secondary | ICD-10-CM | POA: Diagnosis not present

## 2021-07-08 DIAGNOSIS — Z853 Personal history of malignant neoplasm of breast: Secondary | ICD-10-CM | POA: Insufficient documentation

## 2021-07-08 MED ORDER — ESTRADIOL 0.1 MG/GM VA CREA: 1.0000 | TOPICAL_CREAM | Freq: Every day | VAGINAL | 12 refills | Status: DC

## 2021-07-08 NOTE — Progress Notes (Signed)
[facility]  [phone] [phone]    ID: CODIE HAINER DOB: 08/08/1946  MR#: [id number]  BLT#:[id number]  Patient Care Team: Caren Macadam, MD as PCP - General (Family Medicine) Lorretta Harp, MD as PCP - Cardiology (Cardiology) Leily Capek, Virgie Dad, MD as Consulting Physician (Oncology) Erroll Luna, MD as Consulting Physician (General Surgery) Kyung Rudd, MD as Consulting Physician (Radiation Oncology) Leighton Ruff, MD as Consulting Physician (General Surgery) Bjorn Loser, MD as Consulting Physician (Urology) Suella Broad, MD as Consulting Physician (Physical Medicine and Rehabilitation) Marylynn Pearson, MD as Consulting Physician (Obstetrics and Gynecology) OTHER MD:  CHIEF COMPLAINT: triple negative invasive ductal carcinoma  CURRENT TREATMENT: Observation   INTERVAL HISTORY: Hanley returns today for follow-up of her triple negative invasive ductal carcinoma accompanied by her husband. .  Her most recent bilateral diagnostic mammography at [facility] on 04/13/2021 was negative.  She also had lumbar spine films on 05/20/2021 for evaluation of back pain.  This showed some degenerative changes at L5-S1.  There was no acute bony abnormality.  REVIEW OF SYSTEMS: Tresa and Sonia Side have been struggling with the vaginal dryness issue.  I wrote him for Estring and they found this would be $500.  They were able to obtain a coupon but because she is on Medicare she was not allowed to use the coupon.  She went on Replens and she is using that 3 times a week and it is helping some but there is one place on the outside of the introitus that is painful.  This has been evaluated by both gynecology and her primary and none of them can see or feel any change there but the patient certainly has pain in that area with intercourse and this negates all the lubrication and use of dilators that she is following.  Aside from this a  detailed review of systems today was benign   COVID 19 VACCINATION STATUS: [place] x3, most recently 06/2020; infection 11/2020   HISTORY OF CURRENT ILLNESS: From the original intake note:  CORALYN ROSELLI had routine screening mammography on 04/10/2019 showing a possible abnormality in the right breast. She underwent unilateral right diagnostic mammography with tomography and right breast ultrasonography at [facility] on 04/12/2019 showing: Breast Density Category B. Spot compression views of the medial right breast, including tomography confirm an irregular mass spanning approximately 0.9 cm. On physical exam, no mass is palpated in the upper inner quadrant of the right breast. Targeted ultrasound is performed, showing a single irregular mass versus two immediately adjacent small irregular masses, that in total measure 1.1 cm. Individually, the two irregular masses measure 0.8 x 0.4 x 0.5 cm and 0.4 x 0.4 x 0.4 cm, respectively. Mass(es) are at 1 o'clock position 3 cm from the nipple. Ultrasound of the right axilla is negative for lymphadenopathy.  Accordingly on 04/16/2019 she proceeded to biopsy of the right breast area in question. The pathology from this procedure showed ([id number]): invasive ductal carcinoma, grade II. Prognostic indicators significant for: estrogen receptor, 0% negative and progesterone receptor, 0% negative,. Proliferation marker Ki67 at 20%. HER2 negative (1+) by immunohistochemistry.  The patient's subsequent history is as detailed below.   PAST MEDICAL HISTORY: Past Medical History:  Diagnosis Date   Arthritis    Breast cancer ([place])    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   Family history of brain cancer    Family history of breast cancer    Family history of colon cancer  Fecal incontinence    05-16-2019 per pt only a little leakage but controllable (was in clinical trial for [facility] without benefit.)   Frequent headaches    History of syncope     pre-syncope  08/ 2015  per pt no issue with this since    Hyperlipidemia    Malignant neoplasm of upper-inner quadrant of right breast in female, estrogen receptor negative [facility]) oncologist-- dr Jana Hakim  dr moody   dx 08/ 2020--  invasive ductal carcinoma,  05-13-2019  s/p right breast lumpectomy    PAF (paroxysmal atrial fibrillation) ([place]) (05-16-2019   pt from [place], recently moved to [place] to be near daughter, has not established a cardiology yet--  previously seen by dr Lowella Dandy cossu (last office note dated 02-28-2018 scanned in epic)---  echo 04-15-2014  ef 55-60%, G1DD mild AR without stenosis, RVSp 10mHg   Personal history of radiation therapy    stopped 09/22/19    Rectal cyst    Thyroid goiter    pt ultrasound in epic 06-04-2014 , nodules, no bx done   Urinary incontinence, mixed     PAST SURGICAL HISTORY: Past Surgical History:  Procedure Laterality Date   ABDOMINAL HYSTERECTOMY  1978   w/  RSO  and APPENDECTOMY   BREAST BIOPSY Right 08/2009   benign   BREAST LUMPECTOMY WITH RADIOACTIVE SEED AND SENTINEL LYMPH NODE BIOPSY Right 05/15/2019   Procedure: RIGHT BREAST LUMPECTOMY WITH RADIOACTIVE SEED AND SENTINEL LYMPH NODE MAPPING;  Surgeon: CErroll Luna MD;  [facility]  Service: General;  Laterality: Right;   BREAST SURGERY  2011   biospy of right breast   CATARACT EXTRACTION W/ INTRAOCULAR LENS  IMPLANT, BILATERAL  2004   COLONOSCOPY  11/21/2019   FLEXIBLE SIGMOIDOSCOPY N/A 05/18/2019   Procedure: FLEXIBLE SIGMOIDOSCOPY,  TRANSANAL EXCISION inclusion cyst;  Surgeon: TLeighton Ruff MD;  [facility]  Service: General;  Laterality: N/A;   gluteus medius repair  2017   LUMBAR LAMINECTOMY  2013   L5 -- S1   PORT-A-CATH REMOVAL Right 09/27/2019   Procedure: PORT REMOVAL;  Surgeon: CErroll Luna MD;  [facility]  Service: General;  Laterality: Right;   PORTACATH PLACEMENT Right 05/15/2019    Procedure: INSERTION PORT-A-CATH WITH ULTRASOUND;  Surgeon: CErroll Luna MD;  [facility]  Service: General;  Laterality: Right;   ROTATOR CUFF REPAIR Left 2001    FAMILY HISTORY: Family History  Problem Relation Age of [place]   COPD Mother    Cancer Mother 621      colon   Arthritis Mother    Hypertension Mother    Miscarriages / SK[place]    Stroke Mother 7104  Alzheimer's disease Mother 865  Heart disease Father    Heart attack Father 267  Lung disease Father    Arthritis Sister    Breast cancer Sister 780      Lumpectomy   Diabetes Maternal Grandmother    Breast cancer Niece 373      Louise's Daughter   Cervical cancer Niece 452 The patient's father died at age 943from a heart attack.  The patient's mother died at age 73 she had a gastrointestinal cancer diagnosed age 73  The patient has 1 sister with breast cancer and one niece with breast cancer diagnosed at age 73   GYNECOLOGIC HISTORY:  No LMP recorded. Patient has had a hysterectomy. 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  Her husband Germain Osgood (goes by "Ronald Pippins") is a retired Chief Financial Officer.  Their daughter Johnanna Schneiders (currently divorced) lives in [place].  The patient has no grandchildren.  She is not a church attender   ADVANCED DIRECTIVES: The patient's husband is her healthcare power of attorney   HEALTH MAINTENANCE: Social History   Tobacco Use   Smoking status: Never   Smokeless tobacco: Never  Vaping Use   Vaping Use: Never used  Substance Use Topics   Alcohol use: Yes    Comment: Cocktails-3 days per week   Drug use: Never    Types: Amphetamines    Colonoscopy: March 2021/  PAP: Status post hysterectomy  Bone  density: Pending   Allergies  Allergen Reactions   Penicillins Swelling   Sulfa Antibiotics Nausea And Vomiting     OBJECTIVE: white woman who appears well  Vitals:   07/08/21 1605  BP: 117/71  Pulse: 81  Resp: 18  Temp: 97.7 F (36.5 C)  SpO2: 100%    Wt Readings from Last 3 Encounters:  07/08/21 120 lb 8 oz (54.7 kg)  05/20/21 118 lb (53.5 kg)  04/06/21 118 lb 11.2 oz (53.8 kg)   Body mass index is 22.04 kg/m.    ECOG FS:1 - Symptomatic but completely ambulatory   Sclerae unicteric, EOMs intact Wearing a mask No cervical or supraclavicular adenopathy Lungs no rales or rhonchi Heart regular rate and rhythm Abd soft, nontender, positive bowel sounds MSK no focal spinal tenderness, no upper extremity lymphedema Neuro: nonfocal, well oriented, appropriate affect Breasts: The right breast is status post surgery and radiation.  The cosmetic result is good.  There is still some tenderness to palpation and there is some clear induration associated with the incisions.  There is no evidence of recurrence.  Left breast and both axillae are benign.   LAB RESULTS:  CMP     Component Value Date/Time   NA 140 05/20/2021 1317   K 4.1 05/20/2021 1317   CL 104 05/20/2021 1317   CO2 25 05/20/2021 1317   GLUCOSE 91 05/20/2021 1317   BUN 13 05/20/2021 1317   CREATININE 0.79 05/20/2021 1317   CREATININE 0.77 05/07/2019 1547   CALCIUM 10.1 05/20/2021 1317   PROT 7.1 05/20/2021 1317   PROT 6.6 02/06/2021 0845   ALBUMIN 4.2 05/20/2021 1317   ALBUMIN 4.7 02/06/2021 0845   AST 20 05/20/2021 1317   AST 19 05/07/2019 1547   ALT 16 05/20/2021 1317   ALT 13 05/07/2019 1547   ALKPHOS 61 05/20/2021 1317   BILITOT 0.7 05/20/2021 1317   BILITOT 0.7 02/06/2021 0845   BILITOT 0.5 05/07/2019 1547   GFRNONAA >60 05/20/2021 1317   GFRNONAA >60 05/07/2019 1547   GFRAA >60 05/21/2020 1413   GFRAA >60 05/07/2019 1547    No results found for: TOTALPROTELP, ALBUMINELP, A1GS, A2GS,  BETS, BETA2SER, GAMS, MSPIKE, SPEI  No results found for: KPAFRELGTCHN, LAMBDASER, KAPLAMBRATIO  Lab Results  Component Value Date   WBC 5.6 05/20/2021   NEUTROABS 3.4 05/20/2021   HGB 13.3 05/20/2021   HCT 38.0 05/20/2021   MCV 98.7 05/20/2021   PLT 205 05/20/2021    No results found for: LABCA2  No components found for: [id number]  No results for input(s): INR  in the last 168 hours.  No results found for: LABCA2  No results found for: IHW388  No results found for: EKC003  No results found for: KJZ791  No results found for: CA2729  No components found for: HGQUANT  No results found for: CEA1 / No results found for: CEA1   No results found for: AFPTUMOR  No results found for: CHROMOGRNA  No results found for: HGBA, HGBA2QUANT, HGBFQUANT, HGBSQUAN (Hemoglobinopathy evaluation)   No results found for: LDH  Lab Results  Component Value Date   IRON 117 12/20/2019   TIBC 298 12/20/2019   IRONPCTSAT 39 12/20/2019   (Iron and TIBC)  Lab Results  Component Value Date   FERRITIN 92 12/20/2019    Urinalysis    Component Value Date/Time   BILIRUBINUR Negative 02/27/2020 1123   PROTEINUR Negative 02/27/2020 1123   UROBILINOGEN 0.2 02/27/2020 1123   NITRITE Negative 02/27/2020 1123   LEUKOCYTESUR Trace (A) 02/27/2020 1123    STUDIES:  No results found.   ELIGIBLE FOR AVAILABLE RESEARCH PROTOCOL: NO   ASSESSMENT: 75 y.o. [place], [place] woman status post right breast upper quadrant biopsy for an mT1b (or single T1c)  invasive ductal carcinoma, grade 2, triple negative, with an MIB-1-1 of 20%.  (1) Genetic testing on 05/07/2019 through the [facility] Breast Cancer STAT Panel + Common Hereditary cancer panel found no pathogenic mutations. The STAT Breast cancer panel offered by [facility] includes sequencing and rearrangement analysis for the following 9 genes:  ATM, BRCA1, BRCA2, CDH1, CHEK2, PALB2, PTEN, STK11 and TP53.  The Common Hereditary Cancers Panel offered  by [facility] includes sequencing and/or deletion duplication testing of the following 47 genes: APC, ATM, AXIN2, BARD1, BMPR1A, BRCA1, BRCA2, BRIP1, CDH1, CDKN2A (p14ARF), CDKN2A (p16INK4a), CKD4, CHEK2, CTNNA1, DICER1, EPCAM (Deletion/duplication testing only), GREM1 (promoter region deletion/duplication testing only), KIT, MEN1, MLH1, MSH2, MSH3, MSH6, MUTYH, NBN, NF1, NHTL1, PALB2, PDGFRA, PMS2, POLD1, POLE, PTEN, RAD50, RAD51C, RAD51D, SDHB, SDHC, SDHD, SMAD4, SMARCA4. STK11, TP53, TSC1, TSC2, and VHL.  The following genes were evaluated for sequence changes only: SDHA and HOXB13 c.251G>A variant only.  (2) s/p right lumpectomy and sentinel lymph node sampling 05/15/2019 for a pT1b pN0 stage IB invasive ductal carcinoma, grade 3, with negative margins.  (a) a total of 2 sentinel lymph nodes were removed  (3) adjuvant chemotherapy consisting of cyclophosphamide and docetaxel every 21 days x 4, started 05/29/2019, completed 07/31/2019  (4) adjuvant radiation 08/27/2019 - 09/21/2019 The right breast was treated to 42.56 Gy in 16 fractions followed by an 8 Gy boost to the surgical cavity.  (5) opted against prophylactic antiestrogens  (a) bone density 04/10/2020 shows a T score of -1.9  (6) severe vaginal atrophy with dyspareunia  (a) Estring prescribed 05/20/2021- not affordable    PLAN: Mistina is now a little over 2 years out from definitive surgery for her breast cancer with no evidence of disease recurrence.  This is very favorable.  We have previously discussed the very scant data regarding using vaginal estrogens in women with a history of breast cancer, even triple negative.  She is aware of the possible risks and in this case quality of life trumps those concerns.  They were not able to obtain Estring.  They are doing their best with Replens which they are using as intended.  There is 1 particular spot which is painful but not obviously abnormal by exam.  We are going to go with Estrace  cream.  She will use this 3 times a  week initially and she will make sure that she uses it also on that particularly painful spot as well as internally.  I am hoping that after 3 to 4 weeks she will be able to overcome that particular handicap and at some point she can go to twice a week on using the cream.  I have asked her to give [place] a call in about 6 weeks to let us know how that is going.  Otherwise she will have her next mammogram in August and see [place] again in September 2023.  Total encounter time 25 minutes.Sarajane Jews C. Libbi Towner, MD  07/08/21 4:35 PM Medical Oncology and Hematology [facility] [place], [place] [address] Tel. [phone]    Fax. [phone]    I, Wilburn Mylar, am acting as scribe for Dr. Virgie Dad. Jazell Rosenau.  I, Lurline Del MD, have reviewed the above documentation for accuracy and completeness, and I agree with the above.   *Total Encounter Time as defined by the Centers for Medicare and Medicaid Services includes, in addition to the face-to-face time of a patient visit (documented in the note above) non-face-to-face time: obtaining and reviewing outside history, ordering and reviewing medications, tests or procedures, care coordination (communications with other health care professionals or caregivers) and documentation in the medical record.

## 2021-08-03 ENCOUNTER — Telehealth: Payer: Self-pay

## 2021-08-03 ENCOUNTER — Ambulatory Visit (INDEPENDENT_AMBULATORY_CARE_PROVIDER_SITE_OTHER): Payer: Medicare Other

## 2021-08-03 VITALS — BP 118/62 | HR 59 | Temp 98.5°F | Ht 62.0 in | Wt 118.6 lb

## 2021-08-03 DIAGNOSIS — Z0000 Encounter for general adult medical examination without abnormal findings: Secondary | ICD-10-CM | POA: Diagnosis not present

## 2021-08-03 NOTE — Progress Notes (Signed)
Subjective:   Joanne Hansen is a 75 y.o. female who presents for Medicare Annual (Subsequent) preventive examination.  Review of Systems    No ROS Cardiac Risk Factors include: advanced age (>78men, >27 women)    Objective:    Today's Vitals   08/03/21 1117  BP: 118/62  Pulse: (!) 59  Temp: 98.5 F (36.9 C)  SpO2: 97%  Weight: 118 lb 9.6 oz (53.8 kg)  Height: 5\' 2"  (1.575 m)   Body mass index is 21.69 kg/m.  Advanced Directives 08/03/2021 07/28/2020 10/08/2019 09/27/2019 08/14/2019 05/18/2019 05/15/2019  Does Patient Have a Medical Advance Directive? No No No No No No No  Would patient like information on creating a medical advance directive? No - Patient declined No - Patient declined No - Patient declined No - Patient declined - No - Patient declined Yes (MAU/Ambulatory/Procedural Areas - Information given)    Current Medications (verified) Outpatient Encounter Medications as of 08/03/2021  Medication Sig   metoprolol tartrate (LOPRESSOR) 25 MG tablet Take 1/2 (one-half) tablet by mouth twice daily   CALCIUM-VITAMIN D PO Take 2 capsules by mouth daily. Vit D 1000 with minerals   carboxymethylcellulose (REFRESH PLUS) 0.5 % SOLN 1 drop as needed.   Cyanocobalamin (VITAMIN B 12 PO) Take 1,000 mg by mouth daily. sublingal   methylcellulose oral powder Take by mouth every evening.    simvastatin (ZOCOR) 40 MG tablet TAKE 1 TABLET BY MOUTH ONCE DAILY **NEED  TO  MAKE  AN  APPOINTMENT**   [DISCONTINUED] estradiol (ESTRACE) 0.1 MG/GM vaginal cream Place 1 Applicatorful vaginally at bedtime.   No facility-administered encounter medications on file as of 08/03/2021.    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   Family history of brain cancer    Family history of breast cancer    Family history of colon cancer    Fecal incontinence     05-16-2019 per pt only a little leakage but controllable (was in clinical trial for [facility] without benefit.)   Frequent headaches    History of syncope    pre-syncope  08/ 2015  per pt no issue with this since    Hyperlipidemia    Malignant neoplasm of upper-inner quadrant of right breast in female, estrogen receptor negative [facility]) oncologist-- dr Jana Hakim  dr moody   dx 08/ 2020--  invasive ductal carcinoma,  05-13-2019  s/p right breast lumpectomy    PAF (paroxysmal atrial fibrillation) ([place]) (05-16-2019   pt from [place], recently moved to [place] to be near daughter, has not established a cardiology yet--  previously seen by dr Lowella Dandy cossu (last office note dated 02-28-2018 scanned in epic)---  echo 04-15-2014  ef 55-60%, G1DD mild AR without stenosis, RVSp 30mmHg   Personal history of radiation therapy    stopped 09/22/19    Rectal cyst    Thyroid goiter    pt ultrasound in epic 06-04-2014 , nodules, no bx done   Urinary incontinence, mixed    Past Surgical History:  Procedure Laterality Date   ABDOMINAL HYSTERECTOMY  1978   w/  RSO  and APPENDECTOMY   BREAST BIOPSY Right 08/2009   benign   BREAST LUMPECTOMY WITH RADIOACTIVE SEED AND SENTINEL LYMPH NODE BIOPSY Right 05/15/2019   Procedure: RIGHT BREAST LUMPECTOMY WITH RADIOACTIVE SEED AND SENTINEL LYMPH NODE MAPPING;  Surgeon: Erroll Luna, MD;  Location: [facility];  Service: General;  Laterality: Right;   BREAST SURGERY  2011   biospy of right breast   CATARACT EXTRACTION W/ INTRAOCULAR LENS  IMPLANT, BILATERAL  2004   COLONOSCOPY  11/21/2019   FLEXIBLE SIGMOIDOSCOPY N/A 05/18/2019   Procedure: FLEXIBLE SIGMOIDOSCOPY,  TRANSANAL EXCISION inclusion cyst;  Surgeon: Leighton Ruff, MD;  [facility];  Service: General;  Laterality: N/A;   gluteus medius repair  2017   LUMBAR LAMINECTOMY  2013   L5 -- S1   PORT-A-CATH REMOVAL Right 09/27/2019   Procedure: PORT REMOVAL;  Surgeon: Erroll Luna, MD;  Location: [facility];  Service: General;  Laterality: Right;   PORTACATH PLACEMENT Right 05/15/2019   Procedure: INSERTION PORT-A-CATH WITH ULTRASOUND;  Surgeon: Erroll Luna, MD;  Location: [facility];  Service: General;  Laterality: Right;   ROTATOR CUFF REPAIR Left 2001   Family History  Problem Relation Age of Onset   COPD Mother    Cancer Mother 30       colon   Arthritis Mother    Hypertension Mother    Miscarriages / [place] Mother    Stroke Mother 39   Alzheimer's disease Mother 46   Heart disease Father    Heart attack Father 2   Lung disease Father    Arthritis Sister    Breast cancer Sister 8       Lumpectomy   Diabetes Maternal Grandmother    Breast cancer Niece 11       Louise's Daughter   Cervical cancer Niece 26   Social History   Socioeconomic History   Marital status: Married    Spouse name: Not on file   Number of children: Not on file   Years of education: Not on file   Highest education level: Not on file  Occupational History   Not on file  Tobacco Use   Smoking status: Never   Smokeless tobacco: Never  Vaping Use   Vaping Use: Never used  Substance and Sexual Activity   Alcohol use: Yes    Comment: Cocktails-3 days per week   Drug use: Never    Types: Amphetamines   Sexual activity: Not on file  Other Topics Concern   Not on file  Social History Narrative   Not on file   Social Determinants of Health   Financial Resource Strain: Low Risk    Difficulty of Paying Living Expenses: Not hard at all  Food Insecurity: No Food Insecurity   Worried About Charity fundraiser in the Last Year: Never true   Arboriculturist in the Last Year: Never true  Transportation Needs: No Transportation Needs   Lack of Transportation (Medical): No   Lack of Transportation (Non-Medical): No  Physical Activity: Sufficiently Active   Days of Exercise per Week: 5 days   Minutes of Exercise per Session: 40 min   Stress: No Stress Concern Present   Feeling of Stress : Not at all  Social Connections: Moderately Integrated   Frequency of Communication with Friends and Family: More than three times a week   Frequency of Social Gatherings with Friends and Family: More than three times a week   Attends Religious Services: Never   Marine scientist or Organizations: Yes   Attends Music therapist: More than 4 times per year   Marital Status: Married    Tobacco Counseling Counseling given: Not Answered   Clinical Intake:  Pre-visit preparation completed: YesDiabetic? No  Activities of Daily Living In your present state of health, do you have any difficulty performing the following activities: 08/03/2021  Hearing? N  Vision? N  Comment Wears reading glasses. Followed byDr Delman Cheadle  Difficulty concentrating or making decisions? N  Walking or climbing stairs? N  Dressing or bathing? N  Doing errands, shopping? N  Preparing Food and eating ? N  Using the Toilet? N  In the past six months, have you accidently leaked urine? Y  Comment Wears pads. Followed by Urologist  Do you have problems with loss of bowel control? Y  Comment Wears pads. Followed by Urologist  Managing your Medications? N  Managing your Finances? N  Housekeeping or managing your Housekeeping? N  Some recent data might be hidden    Patient Care Team: Caren Macadam, MD as PCP - General (Family Medicine) Lorretta Harp, MD as PCP - Cardiology (Cardiology) Magrinat, Virgie Dad, MD as Consulting Physician (Oncology) Erroll Luna, MD as Consulting Physician (General Surgery) Kyung Rudd, MD as Consulting Physician (Radiation Oncology) Leighton Ruff, MD as Consulting Physician (General Surgery) Bjorn Loser, MD as Consulting Physician (Urology) Suella Broad, MD as Consulting Physician (Physical Medicine and Rehabilitation) Marylynn Pearson, MD as Consulting Physician (Obstetrics and  Gynecology)  Indicate any recent Ideal you may have received from other than [facility] providers in the past year (date may be approximate).     Assessment:   This is a routine wellness examination for Rajean.  Hearing/Vision screen Hearing Screening - Comments:: No difficulty hearing Vision Screening - Comments:: Wears reading glasses. Followed by Dr Delman Cheadle  Dietary issues and exercise activities discussed: Current Exercise Habits: Home exercise routine, Type of exercise: walking, Time (Minutes): 40, Frequency (Times/Week): 5, Weekly Exercise (Minutes/Week): 200, Intensity: Moderate   Goals Addressed             This Visit's Progress    Patient stated       I will continue to walk at least a couple miles 5 times per week .       Depression Screen PHQ 2/9 Scores 08/03/2021 07/28/2020 07/02/2019 11/08/2018  PHQ - 2 Score 0 0 0 0  PHQ- 9 Score - 0 - -    Fall Risk Fall Risk  08/03/2021 07/28/2020 07/02/2019 11/08/2018  Falls in the past year? - 0 0 0  Number falls in past yr: 0 0 0 0  Injury with Fall? 0 0 0 0  Risk for fall due to : - No Fall Risks - -  Follow up - Falls evaluation completed;Falls prevention discussed - Falls evaluation completed    FALL RISK PREVENTION PERTAINING TO THE HOME:  Any stairs in or around the home? Yes  If so, are there any without handrails? No  Home free of loose throw rugs in walkways, pet beds, electrical cords, etc? Yes  Adequate lighting in your home to reduce risk of falls? Yes   ASSISTIVE DEVICES UTILIZED TO PREVENT FALLS:  Life alert? No  Use of a cane, walker or w/c? No  Grab bars in the bathroom? Yes  Shower chair or bench in shower? Yes  Elevated toilet seat or a handicapped toilet? No   TIMED UP AND GO:  Was the test performed? Yes .  Length of time to ambulate 10 feet: 5 sec.   Gait steady and fast without use of assistive device  Cognitive Function:    6CIT Screen 08/03/2021  What Year? 0 points  What  month? 0 points  What time? 0 points  Count back from 20 0 points  Months in reverse 0 points  Repeat phrase 0 points  Total Score 0    Immunizations Immunization History  Administered Date(s) Administered   DTaP 04/12/2012   Fluad Quad(high Dose 65+) 05/29/2021   Influenza, High Dose Seasonal PF 05/02/2019   Influenza-Unspecified 06/01/2011, 05/17/2012, 06/27/2013, 06/03/2014, 05/19/2015, 04/23/2017, 05/06/2018, 05/02/2019   PFIZER(Purple Top)SARS-COV-2 Vaccination 09/16/2019, 10/22/2019, 06/28/2020, 12/22/2020, 01/01/2021   Pneumococcal Conjugate-13 06/03/2014   Pneumococcal Polysaccharide-23 12/26/2012   Td 08/23/1994   Tdap 04/11/2012, 04/12/2012   Zoster Recombinat (Shingrix) 04/05/2021, 06/11/2021   Zoster, Live 09/04/2010    TDAP status: Up to date  Flu Vaccine status: Up to date  Pneumococcal vaccine status: Due, Education has been provided regarding the importance of this vaccine. Advised may receive this vaccine at local pharmacy or Health Dept. Aware to provide a copy of the vaccination record if obtained from local pharmacy or Health Dept. Verbalized acceptance and understanding.  Covid-19 vaccine status: Completed vaccines  Qualifies for Shingles Vaccine? Yes   Zostavax completed Yes   Shingrix Completed?: Yes  Screening Tests Health Maintenance  Topic Date Due   COVID-19 Vaccine (6 - Booster for Pfizer series) 02/26/2021   TETANUS/TDAP  04/12/2022   MAMMOGRAM  04/14/2023   COLONOSCOPY (Pts 45-66yrs Insurance coverage will need to be confirmed)  11/20/2024   Pneumonia Vaccine 68+ Years old  Completed   INFLUENZA VACCINE  Completed   DEXA SCAN  Completed   Hepatitis C Screening  Completed   Zoster Vaccines- Shingrix  Completed   HPV VACCINES  Aged Out    Health Maintenance  Health Maintenance Due  Topic Date Due   COVID-19 Vaccine (6 - Booster for [place] series) 02/26/2021    Additional Screening:   Vision Screening: Recommended annual  ophthalmology exams for early detection of glaucoma and other disorders of the eye. Is the patient up to date with their annual eye exam?  Yes  Who is the provider or what is the name of the office in which the patient attends annual eye exams? Followed by DrGould.  Dental Screening: Recommended annual dental exams for proper oral hygiene  Community Resource Referral / Chronic Care Management:  CRR required this visit?  No   CCM required this visit?  No      Plan:     I have personally reviewed and noted the following in the patient's chart:   Medical and social history Use of alcohol, tobacco or illicit drugs Patient currently not taking opioids. Patient Currently not taking opioids. Current medications and supplements including opioid prescriptions.  Functional ability and status Nutritional status Physical activity Advanced directives List of other physicians Hospitalizations, surgeries, and ER visits in previous 12 months Vitals Screenings to include cognitive, depression, and falls Referrals and appointments  In addition, I have reviewed and discussed with patient certain preventive protocols, quality metrics, and best practice recommendations. A written personalized care plan for preventive services as well as general preventive health recommendations were provided to patient.     Criselda Peaches, LPN   [id number]

## 2021-08-03 NOTE — Telephone Encounter (Signed)
Spoke with the patient and she stated her toenails appear to be white or discolored.  I advised the patient we do not have a wait list, she could see another provider and offered an appt on 12/14 as there was a cancellation at 8am.  Appt was scheduled and the patient is aware to arrive at 7:45am for check in.

## 2021-08-03 NOTE — Patient Instructions (Addendum)
Joanne Hansen , Thank you for taking time to come for your Medicare Wellness Visit. I appreciate your ongoing commitment to your health goals. Please review the following plan we discussed and let me know if I can assist you in the future.   These are the goals we discussed:  Goals      Patient stated     I will continue to walk at least a couple miles 5 times per week .     Patient Stated     I would like to stay healthy.         This is a list of the screening recommended for you and due dates:  Health Maintenance  Topic Date Due   COVID-19 Vaccine (6 - Booster for Pfizer series) 02/26/2021   Tetanus Vaccine  04/12/2022   Mammogram  04/14/2023   Colon Cancer Screening  11/20/2024   Pneumonia Vaccine  Completed   Flu Shot  Completed   DEXA scan (bone density measurement)  Completed   Hepatitis C Screening: USPSTF Recommendation to screen - Ages 49-79 yo.  Completed   Zoster (Shingles) Vaccine  Completed   HPV Vaccine  Aged Out    Advanced directives: No  Conditions/risks identified: None  Next appointment: Follow up in one year for your annual wellness visit   Preventive Care 65 Years and Older, Female Preventive care refers to lifestyle choices and visits with your health care provider that can promote health and wellness. What does preventive care include? A yearly physical exam. This is also called an annual well check. Dental exams once or twice a year. Routine eye exams. Ask your health care provider how often you should have your eyes checked. Personal lifestyle choices, including: Daily care of your teeth and gums. Regular physical activity. Eating a healthy diet. Avoiding tobacco and drug use. Limiting alcohol use. Practicing safe sex. Taking low-dose aspirin every day. Taking vitamin and mineral supplements as recommended by your health care provider. What happens during an annual well check? The services and screenings done by your health care provider  during your annual well check will depend on your age, overall health, lifestyle risk factors, and family history of disease. Counseling  Your health care provider may ask you questions about your: Alcohol use. Tobacco use. Drug use. Emotional well-being. Home and relationship well-being. Sexual activity. Eating habits. History of falls. Memory and ability to understand (cognition). Work and work Statistician. Reproductive health. Screening  You may have the following tests or measurements: Height, weight, and BMI. Blood pressure. Lipid and cholesterol levels. These may be checked every 5 years, or more frequently if you are over 88 years old. Skin check. Lung cancer screening. You may have this screening every year starting at age 25 if you have a 30-pack-year history of smoking and currently smoke or have quit within the past 15 years. Fecal occult blood test (FOBT) of the stool. You may have this test every year starting at age 90. Flexible sigmoidoscopy or colonoscopy. You may have a sigmoidoscopy every 5 years or a colonoscopy every 10 years starting at age 91. Hepatitis C blood test. Hepatitis B blood test. Sexually transmitted disease (STD) testing. Diabetes screening. This is done by checking your blood sugar (glucose) after you have not eaten for a while (fasting). You may have this done every 1-3 years. Bone density scan. This is done to screen for osteoporosis. You may have this done starting at age 48. Mammogram. This may be done every 1-2  years. Talk to your health care provider about how often you should have regular mammograms. Talk with your health care provider about your test results, treatment options, and if necessary, the need for more tests. Vaccines  Your health care provider may recommend certain vaccines, such as: Influenza vaccine. This is recommended every year. Tetanus, diphtheria, and acellular pertussis (Tdap, Td) vaccine. You may need a Td booster every  10 years. Zoster vaccine. You may need this after age 61. Pneumococcal 13-valent conjugate (PCV13) vaccine. One dose is recommended after age 47. Pneumococcal polysaccharide (PPSV23) vaccine. One dose is recommended after age 53. Talk to your health care provider about which screenings and vaccines you need and how often you need them. This information is not intended to replace advice given to you by your health care provider. Make sure you discuss any questions you have with your health care provider. Document Released: 09/05/2015 Document Revised: 04/28/2016 Document Reviewed: 06/10/2015 Elsevier Interactive Patient Education  2017 [employer] Prevention in the Home Falls can cause injuries. They can happen to people of all ages. There are many things you can do to make your home safe and to help prevent falls. What can I do on the outside of my home? Regularly fix the edges of walkways and driveways and fix any cracks. Remove anything that might make you trip as you walk through a door, such as a raised step or threshold. Trim any bushes or trees on the path to your home. Use bright outdoor lighting. Clear any walking paths of anything that might make someone trip, such as rocks or tools. Regularly check to see if handrails are loose or broken. Make sure that both sides of any steps have handrails. Any raised decks and porches should have guardrails on the edges. Have any leaves, snow, or ice cleared regularly. Use sand or salt on walking paths during winter. Clean up any spills in your garage right away. This includes oil or grease spills. What can I do in the bathroom? Use night lights. Install grab bars by the toilet and in the tub and shower. Do not use towel bars as grab bars. Use non-skid mats or decals in the tub or shower. If you need to sit down in the shower, use a plastic, non-slip stool. Keep the floor dry. Clean up any water that spills on the floor as soon as it  happens. Remove soap buildup in the tub or shower regularly. Attach bath mats securely with double-sided non-slip rug tape. Do not have throw rugs and other things on the floor that can make you trip. What can I do in the bedroom? Use night lights. Make sure that you have a light by your bed that is easy to reach. Do not use any sheets or blankets that are too big for your bed. They should not hang down onto the floor. Have a firm chair that has side arms. You can use this for support while you get dressed. Do not have throw rugs and other things on the floor that can make you trip. What can I do in the kitchen? Clean up any spills right away. Avoid walking on wet floors. Keep items that you use a lot in easy-to-reach places. If you need to reach something above you, use a strong step stool that has a grab bar. Keep electrical cords out of the way. Do not use floor polish or wax that makes floors slippery. If you must use wax, use non-skid floor  wax. Do not have throw rugs and other things on the floor that can make you trip. What can I do with my stairs? Do not leave any items on the stairs. Make sure that there are handrails on both sides of the stairs and use them. Fix handrails that are broken or loose. Make sure that handrails are as long as the stairways. Check any carpeting to make sure that it is firmly attached to the stairs. Fix any carpet that is loose or worn. Avoid having throw rugs at the top or bottom of the stairs. If you do have throw rugs, attach them to the floor with carpet tape. Make sure that you have a light switch at the top of the stairs and the bottom of the stairs. If you do not have them, ask someone to add them for you. What else can I do to help prevent falls? Wear shoes that: Do not have high heels. Have rubber bottoms. Are comfortable and fit you well. Are closed at the toe. Do not wear sandals. If you use a stepladder: Make sure that it is fully opened.  Do not climb a closed stepladder. Make sure that both sides of the stepladder are locked into place. Ask someone to hold it for you, if possible. Clearly mark and make sure that you can see: Any grab bars or handrails. First and last steps. Where the edge of each step is. Use tools that help you move around (mobility aids) if they are needed. These include: Canes. Walkers. Scooters. Crutches. Turn on the lights when you go into a dark area. Replace any light bulbs as soon as they burn out. Set up your furniture so you have a clear path. Avoid moving your furniture around. If any of your floors are uneven, fix them. If there are any pets around you, be aware of where they are. Review your medicines with your doctor. Some medicines can make you feel dizzy. This can increase your chance of falling. Ask your doctor what other things that you can do to help prevent falls. This information is not intended to replace advice given to you by your health care provider. Make sure you discuss any questions you have with your health care provider. Document Released: 06/05/2009 Document Revised: 01/15/2016 Document Reviewed: 09/13/2014 Elsevier Interactive Patient Education  2017 [employer].

## 2021-08-03 NOTE — Telephone Encounter (Signed)
Patient called asking if she can be worked in before her appt on 1/16 or be put on a wait list, I informed patient I would send a message

## 2021-08-05 ENCOUNTER — Encounter: Payer: Self-pay | Admitting: Family Medicine

## 2021-08-05 ENCOUNTER — Ambulatory Visit (INDEPENDENT_AMBULATORY_CARE_PROVIDER_SITE_OTHER): Payer: Medicare Other | Admitting: Family Medicine

## 2021-08-05 VITALS — BP 102/64 | HR 70 | Temp 98.3°F | Ht 62.0 in | Wt 117.3 lb

## 2021-08-05 DIAGNOSIS — S99929A Unspecified injury of unspecified foot, initial encounter: Secondary | ICD-10-CM | POA: Diagnosis not present

## 2021-08-05 NOTE — Progress Notes (Signed)
°  Joanne Hansen DOB: 05-08-1946 Encounter date: 08/05/2021  This is a 75 y.o. female who presents with Chief Complaint  Patient presents with   Nail Problem    Patient complains of "white discoloration" bilateral toes x2 weeks    History of present illness:  When she took off polish a few weeks ago she noticed white spots. Polish has been on for a month. She painted nails herself for last few months. Every once in awhile feels some itching around nail and at one point rubbed to point of blister/scab. But this has now healed.   Does feel like there is some improvement/outgrowth at this point of normal nail.  She has recently had back injection. Working through back pain with specialist.   She did not tolerate estrogen cream for dyspareunia. She had completed pelvic floor rehab, dilators without improvement. She is now just working around issue. Husband is supportive.    Allergies  Allergen Reactions   Penicillins Swelling   Sulfa Antibiotics Nausea And Vomiting   Current Meds  Medication Sig   CALCIUM-VITAMIN D PO Take 2 capsules by mouth daily. Vit D 1000 with minerals   carboxymethylcellulose (REFRESH PLUS) 0.5 % SOLN 1 drop as needed.   Cyanocobalamin (VITAMIN B 12 PO) Take 1,000 mg by mouth daily. sublingal   methylcellulose oral powder Take by mouth every evening.    metoprolol tartrate (LOPRESSOR) 25 MG tablet Take 1/2 (one-half) tablet by mouth twice daily   simvastatin (ZOCOR) 40 MG tablet TAKE 1 TABLET BY MOUTH ONCE DAILY **NEED  TO  MAKE  AN  APPOINTMENT**    Review of Systems  Constitutional:  Negative for chills, fatigue and fever.  Respiratory:  Negative for cough, chest tightness, shortness of breath and wheezing.   Cardiovascular:  Negative for chest pain, palpitations and leg swelling.  Skin:        See hpi   Objective:  BP 102/64 (BP Location: Left Arm, Patient Position: Sitting, Cuff Size: Normal)    Pulse 70    Temp 98.3 F (36.8 C) (Oral)    Ht 5'  2" (1.575 m)    Wt 117 lb 4.8 oz (53.2 kg)    SpO2 98%    BMI 21.45 kg/m   Weight: 117 lb 4.8 oz (53.2 kg)   BP Readings from Last 3 Encounters:  08/05/21 102/64  08/03/21 118/62  07/08/21 117/71   Wt Readings from Last 3 Encounters:  08/05/21 117 lb 4.8 oz (53.2 kg)  08/03/21 118 lb 9.6 oz (53.8 kg)  07/08/21 120 lb 8 oz (54.7 kg)    Physical Exam Constitutional:      Appearance: Normal appearance.  Skin:    Comments: Great right toe there is healed scar from prior blister. No skin opening, appears healing well.   There appears to be nail damage - I suspect polish/nail trauma from prior pedicure. Nails have grown out about half of nail is white, but not thickened/otherwise discolored.   Neurological:     Mental Status: She is alert.    Assessment/Plan  1. Injury of toenail, unspecified laterality, initial encounter Take break from nail polish and nail polish remover/chemicals. Continue to monitor for improvement. Take pictures to continue to monitor. Keep feet well moisturized.   Return if symptoms worsen or fail to improve.      Micheline Rough, MD

## 2021-08-05 NOTE — Patient Instructions (Addendum)
On mychart:   If you go to menu: preventive care: you can see mammogram, immunizations, family/personal history.

## 2021-08-12 ENCOUNTER — Ambulatory Visit: Payer: Medicare Other | Admitting: Family Medicine

## 2021-09-04 ENCOUNTER — Other Ambulatory Visit: Payer: Self-pay | Admitting: Family Medicine

## 2021-09-07 ENCOUNTER — Ambulatory Visit: Payer: Medicare Other | Admitting: Family Medicine

## 2021-09-25 ENCOUNTER — Other Ambulatory Visit: Payer: Self-pay | Admitting: Adult Health

## 2021-09-25 NOTE — Telephone Encounter (Signed)
Message routed to PCP CMA  

## 2021-12-10 ENCOUNTER — Other Ambulatory Visit: Payer: Self-pay | Admitting: Family Medicine

## 2022-01-11 ENCOUNTER — Other Ambulatory Visit: Payer: Self-pay | Admitting: Family Medicine

## 2022-01-26 ENCOUNTER — Other Ambulatory Visit: Payer: Self-pay | Admitting: Oncology

## 2022-02-03 ENCOUNTER — Other Ambulatory Visit: Payer: Self-pay | Admitting: *Deleted

## 2022-02-03 MED ORDER — SIMVASTATIN 40 MG PO TABS: ORAL_TABLET | ORAL | 0 refills | Status: DC

## 2022-02-24 ENCOUNTER — Other Ambulatory Visit: Payer: Self-pay | Admitting: Hematology and Oncology

## 2022-02-24 DIAGNOSIS — C50211 Malignant neoplasm of upper-inner quadrant of right female breast: Secondary | ICD-10-CM

## 2022-03-05 ENCOUNTER — Other Ambulatory Visit: Payer: Self-pay | Admitting: Family

## 2022-03-10 ENCOUNTER — Other Ambulatory Visit: Payer: Self-pay | Admitting: *Deleted

## 2022-03-11 MED ORDER — SIMVASTATIN 40 MG PO TABS: ORAL_TABLET | ORAL | 0 refills | Status: DC

## 2022-03-15 ENCOUNTER — Other Ambulatory Visit: Payer: Self-pay | Admitting: *Deleted

## 2022-03-16 MED ORDER — METOPROLOL TARTRATE 25 MG PO TABS: ORAL_TABLET | ORAL | 0 refills | Status: DC

## 2022-03-26 ENCOUNTER — Encounter: Payer: Self-pay | Admitting: Obstetrics and Gynecology

## 2022-03-26 ENCOUNTER — Other Ambulatory Visit (HOSPITAL_COMMUNITY)
Admission: RE | Admit: 2022-03-26 | Discharge: 2022-03-26 | Disposition: A | Discharge status: Home / Self Care | Payer: Medicare Other | Source: Ambulatory Visit | Attending: Obstetrics and Gynecology | Admitting: Obstetrics and Gynecology

## 2022-03-26 ENCOUNTER — Ambulatory Visit (INDEPENDENT_AMBULATORY_CARE_PROVIDER_SITE_OTHER): Payer: Medicare Other | Admitting: Obstetrics and Gynecology

## 2022-03-26 VITALS — BP 136/79 | HR 64 | Ht 61.25 in | Wt 120.0 lb

## 2022-03-26 DIAGNOSIS — N904 Leukoplakia of vulva: Secondary | ICD-10-CM

## 2022-03-26 DIAGNOSIS — M62838 Other muscle spasm: Secondary | ICD-10-CM | POA: Diagnosis not present

## 2022-03-26 DIAGNOSIS — N941 Unspecified dyspareunia: Secondary | ICD-10-CM

## 2022-03-26 DIAGNOSIS — N393 Stress incontinence (female) (male): Secondary | ICD-10-CM | POA: Diagnosis not present

## 2022-03-26 DIAGNOSIS — N9089 Other specified noninflammatory disorders of vulva and perineum: Secondary | ICD-10-CM | POA: Diagnosis present

## 2022-03-26 DIAGNOSIS — R159 Full incontinence of feces: Secondary | ICD-10-CM | POA: Diagnosis not present

## 2022-03-26 MED ORDER — LIDOCAINE-EPINEPHRINE 1 %-1:100000 IJ SOLN
3.0000 mL | Freq: Once | INTRAMUSCULAR | Status: AC
  Administered 2022-03-26: 3 mL via INTRADERMAL

## 2022-03-26 MED ORDER — NONFORMULARY OR COMPOUNDED ITEM: 5 refills | Status: DC

## 2022-03-26 NOTE — Progress Notes (Signed)
[facility] New Patient Evaluation and Consultation  Referring Provider: Marylynn Pearson, MD PCP: Caren Macadam, MD (Inactive) Date of Service: 03/26/2022  SUBJECTIVE Chief Complaint: New Patient (Initial Visit)  History of Present Illness: Joanne Hansen is a 76 y.o. White or Caucasian female seen in consultation at the request of Dr. Julien Girt for evaluation of dyspareunia.    Review of records from Dr Julien Girt significant for: She is using estradiol and clobetasol vaginal creams and reports pain with intercourse. Tried lidocaine.   Urinary Symptoms: Leaks urine with cough/ sneeze, laughing, exercise, and lifting. Occasional urgency if she waits too long to urinate but this is infrequent.  Leaks 3- 4 time(s) per day.  Pad use: 3-4 liners/ mini-pads per day.   She is bothered by her UI symptoms. Has not had other treatment other than physical therapy.  Seen Dr Lorra Hals and had Urodynamic testing but then got diagnosed with breast cancer and never followed up.   Day time voids 6-8.  Nocturia: 0- 1 times per night to void. Voiding dysfunction: she empties her bladder well.  does not use a catheter to empty bladder.  When urinating, she feels dribbling after finishing  UTIs: 2 UTI's in the last year.   Reports history of blood in urine  Pelvic Organ Prolapse Symptoms:                  She Denies a feeling of a bulge the vaginal area.   Bowel Symptom: Bowel movements: 1-2  time(s) per day Stool consistency: hard Straining: yes.  Splinting: no.  Incomplete evacuation: yes, someimes She Admits to accidental bowel leakage / fecal incontinence  Occurs: after a bowel movement  Consistency with leakage: liquid Bowel regimen: diet and fiber- taking citrucel Had hemorrhoid banding with Dr Marcello Moores. And is using anusol.   Sexual Function Sexually active: no.  Sexual orientation:  heterosexual Pain with sex: Yes, at the vaginal opening, has discomfort due to  dryness Has one spot on the outside of the vagina that becomes irritated after/ during intercourse. Started after chemo/ radiation for her breast cancer, about a year and half ago.  Has tried pelvic physical therapy for about 6 months (at [facility]). Used vaginal dilators and vibrator.  Has used estradiol cream and replens and coconut oil but that did not help.  Used lidocaine jelly but that did not improve her symptoms.  Has not had a biopsy of the area.  Clobetasol is on her medication list on her records but she does not recall using this.   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   Family history of brain cancer    Family history of breast cancer    Family history of colon cancer    Fecal incontinence    05-16-2019 per pt only a little leakage but controllable (was in clinical trial for [facility] without benefit.)   Frequent headaches    History of syncope    pre-syncope  08/ 2015  per pt no issue with this since    Hyperlipidemia    Malignant neoplasm of upper-inner quadrant of right breast in female, estrogen receptor negative [facility]) oncologist-- dr Jana Hakim  dr moody   dx 08/ 2020--  invasive ductal carcinoma,  05-13-2019  s/p right breast lumpectomy    PAF (paroxysmal atrial fibrillation) ([place]) (05-16-2019  pt from [place], recently moved to [place] to be near daughter, has not established a cardiology yet--  previously seen by dr Lowella Dandy cossu (last office note dated 02-28-2018 scanned in epic)---  echo 04-15-2014  ef 55-60%, G1DD mild AR without stenosis, RVSp 68mHg   Personal history of radiation therapy    stopped 09/22/19    Rectal cyst    Thyroid goiter    pt ultrasound in epic 06-04-2014 , nodules, no bx done   Urinary incontinence, mixed      Past Surgical History:   Past Surgical History:  Procedure Laterality Date   ABDOMINAL  HYSTERECTOMY  1978   w/  RSO  and APPENDECTOMY   BREAST BIOPSY Right 08/2009   benign   BREAST LUMPECTOMY WITH RADIOACTIVE SEED AND SENTINEL LYMPH NODE BIOPSY Right 05/15/2019   Procedure: RIGHT BREAST LUMPECTOMY WITH RADIOACTIVE SEED AND SENTINEL LYMPH NODE MAPPING;  Surgeon: CErroll Luna MD;  [facility]  Service: General;  Laterality: Right;   BREAST SURGERY  2011   biospy of right breast   CATARACT EXTRACTION W/ INTRAOCULAR LENS  IMPLANT, BILATERAL  2004   COLONOSCOPY  11/21/2019   FLEXIBLE SIGMOIDOSCOPY N/A 05/18/2019   Procedure: FLEXIBLE SIGMOIDOSCOPY,  TRANSANAL EXCISION inclusion cyst;  Surgeon: TLeighton Ruff MD;  [facility]  Service: General;  Laterality: N/A;   gluteus medius repair  2017   LUMBAR LAMINECTOMY  2013   L5 -- S1   PORT-A-CATH REMOVAL Right 09/27/2019   Procedure: PORT REMOVAL;  Surgeon: CErroll Luna MD;  [facility]  Service: General;  Laterality: Right;   PORTACATH PLACEMENT Right 05/15/2019   Procedure: INSERTION PORT-A-CATH WITH ULTRASOUND;  Surgeon: CErroll Luna MD;  [facility]  Service: General;  Laterality: Right;   ROTATOR CUFF REPAIR Left 2001     Past OB/GYN History: OB History     Gravida  1   Para      Term      Preterm      AB      Living  1      SAB      IAB      Ectopic      Multiple      Live Births  1           Vaginal deliveries: 1 S/p hysterectomy   Medications: She has a current medication list which includes the following prescription(s): calcium-vitamin d, carboxymethylcellulose, cyanocobalamin, methylcellulose, metoprolol tartrate, NONFORMULARY OR COMPOUNDED ITEM, and simvastatin.   Allergies: Patient is allergic to penicillins and sulfa antibiotics.   Social History:  Social History   Tobacco Use   Smoking status: Never   Smokeless tobacco: Never  Vaping Use   Vaping Use: Never used   Substance Use Topics   Alcohol use: Yes    Comment: Cocktails-3 days per week   Drug use: Never    Types: Amphetamines    Relationship status: married She lives with husband.   She is not employed. Regular exercise: Yes: walks 2-3 miles about 5 days a week History of abuse: No  Family History:   Family History  Problem Relation Age of Onset   COPD Mother    Cancer Mother 650      colon   Arthritis Mother    Hypertension Mother    Miscarriages / [place]    Stroke Mother 763  Alzheimer's disease Mother 861  Heart disease Father  Heart attack Father 87   Lung disease Father    Arthritis Sister    Breast cancer Sister 64       Lumpectomy   Diabetes Maternal Grandmother    Breast cancer Niece 7       Louise's Daughter   Cervical cancer Niece 61     Review of Systems: Review of Systems  Constitutional:  Negative for fever, malaise/fatigue and weight loss.  Respiratory:  Negative for cough, shortness of breath and wheezing.   Cardiovascular:  Positive for palpitations. Negative for chest pain and leg swelling.  Gastrointestinal:  Negative for abdominal pain and blood in stool.  Genitourinary:  Negative for dysuria.  Musculoskeletal:  Negative for myalgias.  Skin:  Negative for rash.  Neurological:  Negative for dizziness and headaches.  Endo/Heme/Allergies:  Does not bruise/bleed easily.  Psychiatric/Behavioral:  Negative for depression. The patient is not nervous/anxious.      OBJECTIVE Physical Exam: Vitals:   03/26/22 1040  BP: 136/79  Pulse: 64  Weight: 120 lb (54.4 kg)  Height: 5' 1.25" (1.556 m)    Physical Exam Constitutional:      General: She is not in acute distress. Pulmonary:     Effort: Pulmonary effort is normal.  Abdominal:     General: There is no distension.     Palpations: Abdomen is soft.     Tenderness: There is no abdominal tenderness. There is no rebound.  Genitourinary:   Musculoskeletal:        General: No  swelling. Normal range of motion.  Skin:    General: Skin is warm and dry.     Findings: No rash.  Neurological:     Mental Status: She is alert and oriented to person, place, and time.  Psychiatric:        Mood and Affect: Mood normal.        Behavior: Behavior normal.      GU / Detailed Urogynecologic Evaluation:  Pelvic Exam: Normal external female genitalia; Bartholin's and Skene's glands normal in appearance; urethral meatus normal in appearance, no urethral masses or discharge.   CST: negative  Q-tip test- tenderness over perineum distal to introitus in the midline and slightly to the left (see diagram above)  s/p hysterectomy: Speculum exam reveals normal vaginal mucosa with  atrophy and normal vaginal cuff.  Adnexa no mass, fullness, tenderness.    Pelvic floor strength II/V, puborectalis III/V external anal sphincter IV/V  Pelvic floor musculature: Right levator tender, Right obturator tender, Left levator tender, Left obturator tender  POP-Q:   POP-Q  -2.5                                            Aa   -2.5                                           Ba  6                                              C   2.5  Gh  4                                            Pb  7                                            tvl   -1                                            Ap  -1                                            Bp                                                 D   Biopsy Procedure:  Verbal consent was obtained. Area over perineum was injected with 1% lidocaine with epinephrine. 66m punch biopsy was obtained and sent to pathology. Skin was closed with interrupted 3-0 vicryl suture.   Rectal Exam:  Normal sphincter tone, small distal rectocele, enterocoele not present, no rectal masses, no sign of dyssynergia when asking the patient to bear down.  Post-Void Residual (PVR) by Bladder Scan: In order to evaluate  bladder emptying, we discussed obtaining a postvoid residual and she agreed to this procedure.  Procedure: The ultrasound unit was placed on the patient's abdomen in the suprapubic region after the patient had voided. A PVR of 12 ml was obtained by bladder scan.  Laboratory Results: POC urine: trace blood, otherwise negative   ASSESSMENT AND PLAN Ms. JFellingis a 76y.o. with:  1. Vulvar irritation   2. Female dyspareunia   3. Levator spasm   4. Incontinence of feces, unspecified fecal incontinence type   5. SUI (stress urinary incontinence, female)    Vulvar irritation - biopsy performed today of irritated area, sent for pathology - prescribed compounded amitriptyline/ gabapentin/ baclofen to be placed on the area and in the vagina twice a day until follow up visit.   2. Levator spasm/ dyspareunia - Treatment options include pelvic floor physical therapy, local (vaginal) or oral  muscle relaxants, pelvic muscle trigger point injections or centrally acting pain medications.   - recommended restarting vaginal dilators and referral placed to pelvic PT  3. SUI - need records release to get [facility] from [facility] and then we can discuss further treatment options.  - non-surgical options include expectant management, weight loss, physical therapy, as well as a pessary.  Surgical options include a midurethral sling, Burch urethropexy, and transurethral injection of a bulking agent.  Return 4-6 weeks   MJaquita Folds MD

## 2022-03-29 LAB — SURGICAL PATHOLOGY

## 2022-03-29 NOTE — Progress Notes (Signed)
Order faxed to [facility] on 03/26/2022. Confirmation received

## 2022-04-03 ENCOUNTER — Other Ambulatory Visit: Payer: Self-pay | Admitting: Family

## 2022-04-06 NOTE — Therapy (Unsigned)
OUTPATIENT PHYSICAL THERAPY FEMALE PELVIC EVALUATION   Patient Name: Joanne Hansen MRN: [id number] DOB:Jan 29, 1946, 76 y.o., female Today's Date: 04/07/2022   PT End of Session - 04/07/22 1306     Visit Number 1    Date for PT Re-Evaluation 06/30/22    Authorization Type Medicare    Authorization - Visit Number 1    Authorization - Number of Visits 10    PT Start Time 1230    PT Stop Time 1310    PT Time Calculation (min) 40 min    Activity Tolerance Patient tolerated treatment well    Behavior During Therapy [facility] for tasks assessed/performed             Past Medical History:  Diagnosis Date   Arthritis    Breast cancer ([place])    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   Family history of brain cancer    Family history of breast cancer    Family history of colon cancer    Fecal incontinence    05-16-2019 per pt only a little leakage but controllable (was in clinical trial for [facility] without benefit.)   Frequent headaches    History of syncope    pre-syncope  08/ 2015  per pt no issue with this since    Hyperlipidemia    Malignant neoplasm of upper-inner quadrant of right breast in female, estrogen receptor negative [facility]) oncologist-- dr Jana Hakim  dr moody   dx 08/ 2020--  invasive ductal carcinoma,  05-13-2019  s/p right breast lumpectomy    PAF (paroxysmal atrial fibrillation) ([place]) (05-16-2019   pt from [place], recently moved to [place] to be near daughter, has not established a cardiology yet--  previously seen by dr Lowella Dandy cossu (last office note dated 02-28-2018 scanned in epic)---  echo 04-15-2014  ef 55-60%, G1DD mild AR without stenosis, RVSp 41mHg   Personal history of radiation therapy    stopped 09/22/19    Rectal cyst    Thyroid goiter    pt ultrasound in epic 06-04-2014 , nodules, no bx done   Urinary incontinence, mixed    Past Surgical History:  Procedure Laterality Date   ABDOMINAL HYSTERECTOMY  1978   w/  RSO  and APPENDECTOMY   BREAST BIOPSY  Right 08/2009   benign   BREAST LUMPECTOMY WITH RADIOACTIVE SEED AND SENTINEL LYMPH NODE BIOPSY Right 05/15/2019   Procedure: RIGHT BREAST LUMPECTOMY WITH RADIOACTIVE SEED AND SENTINEL LYMPH NODE MAPPING;  Surgeon: CErroll Luna MD;  [facility]  Service: General;  Laterality: Right;   BREAST SURGERY  2011   biospy of right breast   CATARACT EXTRACTION W/ INTRAOCULAR LENS  IMPLANT, BILATERAL  2004   COLONOSCOPY  11/21/2019   FLEXIBLE SIGMOIDOSCOPY N/A 05/18/2019   Procedure: FLEXIBLE SIGMOIDOSCOPY,  TRANSANAL EXCISION inclusion cyst;  Surgeon: TLeighton Ruff MD;  [place] [facility]  Service: General;  Laterality: N/A;   gluteus medius repair  2017   LUMBAR LAMINECTOMY  2013   L5 -- S1   PORT-A-CATH REMOVAL Right 09/27/2019   Procedure: PORT REMOVAL;  Surgeon: CErroll Luna MD;  [place] [facility]  Service: General;  Laterality: Right;   PORTACATH PLACEMENT Right 05/15/2019   Procedure: INSERTION PORT-A-CATH WITH ULTRASOUND;  Surgeon: CErroll Luna MD;  [place] [facility]  Service: General;  Laterality: Right;   ROTATOR CUFF REPAIR Left 2001   Patient Active Problem List   Diagnosis Date Noted   Palpitations 01/20/2021  Anemia, macrocytic 09/25/2019   Paroxysmal atrial fibrillation (HCC) 08/02/2019   Dizziness 08/02/2019   Lower extremity edema 08/02/2019   Genetic testing 05/16/2019   Malignant neoplasm of upper-inner quadrant of right breast in female, estrogen receptor negative ([place]) 05/07/2019   Family history of breast cancer    Family history of colon cancer    Family history of brain cancer    Hyperlipidemia 11/08/2018   Fecal incontinence 11/08/2018   Urinary, incontinence, stress female 11/08/2018    PCP: Caren Macadam, MD  REFERRING PROVIDER: Jaquita Folds, MD  REFERRING DIAG:  [id number] (ICD-10-CM) - Levator spasm  R15.9 (ICD-10-CM) - Incontinence of feces, unspecified  fecal incontinence type  N39.3 (ICD-10-CM) - SUI (stress urinary incontinence, female    THERAPY DIAG:  Cramp and spasm  Muscle weakness (generalized)  Pelvic pain  Rationale for Evaluation and Treatment Rehabilitation  ONSET DATE: 2021  SUBJECTIVE:                                                                                                                                                                                           SUBJECTIVE STATEMENT: Patient symptoms started after she ended her cancer treatments. She has pelvic floor therapy last year at [facility]. I was using dilator and vibrator. MD reports her pelvic floor is tight.  Fluid intake: Yes: water     PAIN:  Are you having pain? Yes NPRS scale: 10/10 Pain location: Vaginal lower right of introitus  Pain type: burning Pain description: intermittent   Aggravating factors: penile penetration, vaginal exam Relieving factors: no penile penetration  PRECAUTIONS: Other: breast cancer  WEIGHT BEARING RESTRICTIONS No  FALLS:  Has patient fallen in last 6 months? No  LIVING ENVIRONMENT: Lives with: lives with their spouse   OCCUPATION: retired  PLOF: Independent  PATIENT GOALS ability to have intercourse without pain;   PERTINENT HISTORY:  Abdominal hysterectomy 1978; right breast cancer estrogen receptor negative; lumbar laminectomy  BOWEL MOVEMENT Pain with bowel movement: No Type of bowel movement:Type (Bristol Stool Scale) type 3, type 2, Frequency daily, and Strain Yes Fully empty rectum: Yes: but sometimes has incomplete evacuation.  Leakage: Occurs: after a bowel movement             Consistency with leakage: liquid Pads: Yes: 3-4 Fiber supplement: Yes: citrucel  URINATION Pain with urination: No Fully empty bladder: Yes: dribbles after urination Stream: Strong Urgency: Yes: if she waits too long Frequency: 2-3 hours Leakage: Coughing, Sneezing, Exercise, and Lifting Pads: Yes:  3-4 liners  INTERCOURSE Pain with intercourse: Initial Penetration due to dryness Ability to have vaginal penetration:  No  Climax: no  Marinoff Scale: 3/3 Has tried pelvic physical therapy for about 6 months (at [facility]). Used vaginal dilators and vibrator.  PREGNANCY Vaginal deliveries 1 Tearing No  PROLAPSE Rectocele small and distal    OBJECTIVE:   DIAGNOSTIC FINDINGS:  PVR of 12 ml ; biopsy was negative.   COGNITION:  Overall cognitive status: Within functional limits for tasks assessed     SENSATION:  Light touch: Appears intact  Proprioception: Appears intact                POSTURE: No Significant postural limitations   PELVIC ALIGNMENT:  LUMBARAROM/PROMlimited by 50-75% due to pain   LOWER EXTREMITY ROM:  Passive ROM Right eval Left eval  Hip extension -5 -5  Hip external rotation 50 35   (Blank rows = not tested)  LOWER EXTREMITY MMT:  MMT Right eval Left eval  Hip extension 3/5 3/5  Hip abduction 3/5 3/5  Hip internal rotation 4/5 4/5  Hip external rotation 4/5 4/5    PALPATION:      External Perineal Exam dryness notided                             Internal Pelvic Floor stitch noticed at 6 O'Clock, Tenderness located with Qtip inserted into the vagina. Therapist placed her index finger into the vaginal canal 1/4 of an inch but patient had pain so did not go further.   Patient confirms identification and approves PT to assess internal pelvic floor and treatment Yes  PELVIC MMT:   MMT eval  Vaginal 2/5  Internal Anal Sphincter   External Anal Sphincter   Puborectalis   Diastasis Recti   (Blank rows = not tested)       The anal strength not tested due to her having some bleeding rectally yesterday.  TONE: increased  PROLAPSE: none  TODAY'S TREATMENT  EVAL Date: 04/07/2022 HEP established-see below    PATIENT EDUCATION:  04/07/2022 Education details: vaginal moisturizers Person educated: Patient Education method:  Consulting civil engineer, Media planner, and Handouts Education comprehension: verbalized understanding   HOME EXERCISE PROGRAM: See above  ASSESSMENT:  CLINICAL IMPRESSION: Patient is a 76 y.o. female who was seen today for physical therapy evaluation and treatment for SUI, fecal leakage, and levator spasm. Patient reports her vaginal pain is  10/10 and not able to have vaginal penetration.    OBJECTIVE IMPAIRMENTS decreased activity tolerance, decreased coordination, decreased endurance, decreased strength, increased fascial restrictions, increased muscle spasms, impaired tone, and pain.   ACTIVITY LIMITATIONS continence and toileting  PARTICIPATION LIMITATIONS: interpersonal relationship  PERSONAL FACTORS Sex, Time since onset of injury/illness/exacerbation, and 3+ comorbidities:    Abdominal hysterectomy 1978; right breast cancer estrogen receptor negative; lumbar laminectomy are also affecting patient's functional outcome.   REHAB POTENTIAL: Excellent  CLINICAL DECISION MAKING: Evolving/moderate complexity  EVALUATION COMPLEXITY: Moderate   GOALS: Goals reviewed with patient? Yes  SHORT TERM GOALS: Target date: 05/05/2022  Patient is independent with pelvic drop and diaphragmatic breathing to relax her pelvic floor.  Baseline: Goal status: INITIAL  2.  Patient understands how to massage the external perineal area to relax the pelvic floor.  Baseline:  Goal status: INITIAL  3.  Patient able to have the Q-tip inserted into the vaginal canal without pain.  Baseline:  Goal status: INITIAL  4.  Patient is using the vaginal vibrator on the tissue to improve blood flow to the tissue and relax the tissue.  Baseline:  Goal status: INITIAL   LONG TERM GOALS: Target date: 06/30/2022   Patient is independent with using the vaginal dilators and on the largest one with pain </= 1/10.  Baseline:  Goal status: INITIAL  2.  Patient is able to have penile penetration with pain level </=  1/10 and possible with the assistance of the [place].  Baseline:  Goal status: INITIAL  3.  Patient reports her stool leakage has improved >/= 75% due to improved strength and coordination of the pelvic floor.  Baseline:  Goal status: INITIAL  4.  Patient reports she is able to hold her urine when she has the urge to urinate and walk to the bathroom.  Baseline:  Goal status: INITIAL  5.  Patient reports her urinary leakage with coughing, sneezing, exercise and lifting improved >/= 75% due to the improved mobility of the pelvic floor muscles.  Baseline:  Goal status: INITIAL    PLAN: PT FREQUENCY: 1x/week  PT DURATION: 12 weeks  PLANNED INTERVENTIONS: Therapeutic exercises, Therapeutic activity, Neuromuscular re-education, Patient/Family education, Self Care, Dry Needling, Electrical stimulation, Taping, Biofeedback, and Manual therapy  PLAN FOR NEXT SESSION: manual work externally to the perineal area and show the patient, diaphragmatic breathing, pelvic drop, see how the moisturizer and cream is doing. Hip stretches that do not bother her back,    Earlie Counts, PT 04/07/22 3:23 PM

## 2022-04-07 ENCOUNTER — Other Ambulatory Visit: Payer: Self-pay

## 2022-04-07 ENCOUNTER — Ambulatory Visit: Payer: Medicare Other | Attending: Obstetrics and Gynecology | Admitting: Physical Therapy

## 2022-04-07 ENCOUNTER — Encounter: Payer: Self-pay | Admitting: Physical Therapy

## 2022-04-07 DIAGNOSIS — M6281 Muscle weakness (generalized): Secondary | ICD-10-CM | POA: Diagnosis present

## 2022-04-07 DIAGNOSIS — R102 Pelvic and perineal pain: Secondary | ICD-10-CM | POA: Diagnosis present

## 2022-04-07 DIAGNOSIS — R252 Cramp and spasm: Secondary | ICD-10-CM | POA: Insufficient documentation

## 2022-04-07 NOTE — Patient Instructions (Signed)
Moisturizers They are used in the vagina to hydrate the mucous membrane that make up the vaginal canal. Designed to keep a more normal acid balance (ph) Once placed in the vagina, it will last between two to three days.  Use 2-3 times per week at bedtime  Ingredients to avoid is glycerin and fragrance, can increase chance of infection Should not be used just before sex due to causing irritation Most are gels administered either in a tampon-shaped applicator or as a vaginal suppository. They are non-hormonal.   Types of Moisturizers(internal use)  Vitamin E vaginal suppositories- Whole foods, Amazon Moist Again Coconut oil- can break down condoms Julva- (Do no use if on Tamoxifen) amazon Yes moisturizer- amazon NeuEve Silk , NeuEve Silver for menopausal or over 65 (if have severe vaginal atrophy or cancer treatments use NeuEve Silk for  1 month than move to NeuEve Silver)- Amazon, Neuve.com Olive and Bee intimate cream- www.oliveandbee.com.au Mae vaginal moisturizer- Amazon Aloe Good Clean Love Hyaluronic acid Hyalofemme replens   Creams to use externally on the Vulva area Desert Harvest Releveum (good for for cancer patients that had radiation to the area)- amazon or www.desertharvest.com V-magic cream - amazon Julva-amazon Vital "V Wild Yam salve ( help moisturize and help with thinning vulvar area, does have Beeswax MoodMaid Botanical Pro-Meno Wild Yam Cream- Amazon Desert Harvest Gele Cleo by Damiva labial moisturizer (Amazon,  Coconut or olive oil aloe Good Clean Love Enchanted Rose by intimate rose  Things to avoid in the vaginal area Do not use things to irritate the vulvar area No lotions just specialized creams for the vulva area- Neogyn, V-magic, No soaps; can use Aveeno or Calendula cleanser if needed. Must be gentle No deodorants No douches Good to sleep without underwear to let the vaginal area to air out No scrubbing: spread the lips to let warm water rinse  over labias and pat dry  [facility] [address], [address] [place], [place] [address] Phone # [phone] Fax [phone]  

## 2022-04-08 ENCOUNTER — Other Ambulatory Visit: Payer: Self-pay | Admitting: *Deleted

## 2022-04-08 MED ORDER — SIMVASTATIN 40 MG PO TABS: ORAL_TABLET | ORAL | 0 refills | Status: DC

## 2022-04-12 ENCOUNTER — Telehealth: Payer: Self-pay

## 2022-04-12 NOTE — Telephone Encounter (Signed)
Joanne Hansen is a 76 y.o. female called in because the compound cream has been burning when applied. I told the patient to stop using the cream and I will call her back when I speak to Dr. Wannetta Sender.  ---  Dr. Wannetta Sender said: "it can be. you can let her know to use a smaller amount and only once a day to start if it is irritating."  Dr Wannetta Sender will f/u when she gets in the office tomorrow.

## 2022-04-14 ENCOUNTER — Other Ambulatory Visit: Payer: Self-pay | Admitting: Family Medicine

## 2022-04-14 ENCOUNTER — Ambulatory Visit
Admission: RE | Admit: 2022-04-14 | Discharge: 2022-04-14 | Disposition: A | Discharge status: Home / Self Care | Payer: Medicare Other | Source: Ambulatory Visit | Attending: Hematology and Oncology | Admitting: Hematology and Oncology

## 2022-04-14 DIAGNOSIS — Z171 Estrogen receptor negative status [ER-]: Secondary | ICD-10-CM

## 2022-04-14 NOTE — Telephone Encounter (Signed)
Having burning with the compounded cream. Pt had used vaginal estrogen cream but was not sure how long she had used it for. She has a prescription at home still. Advised to use a fingertip amount in the vagina (focusing on the opening) for two weeks. The she should do a maintenance dose for twice a week. Then she can try the compounded cream again. She has a follow up at the end of September.   Jaquita Folds, MD

## 2022-05-03 ENCOUNTER — Other Ambulatory Visit: Payer: Self-pay | Admitting: Family Medicine

## 2022-05-03 NOTE — Telephone Encounter (Signed)
I will refill but she will need to see me by December for follow up

## 2022-05-04 NOTE — Telephone Encounter (Signed)
Spoke with the patient and scheduled an appt on 12/14.

## 2022-05-05 ENCOUNTER — Ambulatory Visit: Payer: Medicare Other | Attending: Cardiovascular Disease | Admitting: Cardiovascular Disease

## 2022-05-05 ENCOUNTER — Encounter: Payer: Self-pay | Admitting: Cardiovascular Disease

## 2022-05-05 VITALS — BP 116/60 | HR 54 | Ht 62.0 in | Wt 117.6 lb

## 2022-05-05 DIAGNOSIS — R002 Palpitations: Secondary | ICD-10-CM

## 2022-05-05 DIAGNOSIS — E782 Mixed hyperlipidemia: Secondary | ICD-10-CM | POA: Insufficient documentation

## 2022-05-05 NOTE — Assessment & Plan Note (Signed)
History of palpitations with PAF demonstrated 10 years ago in [place] however event monitor performed 08/05/2019 did not show PAF but did show short runs of PSVT.  2D echo was normal at that time as well.  She is otherwise asymptomatic.  She walks 2 to 3 miles 5 days a week.

## 2022-05-05 NOTE — Progress Notes (Signed)
05/05/2022 Joanne Hansen   08-22-1946  [id number]  Primary Physician Caren Macadam, MD (Inactive) Primary Cardiologist: Lorretta Harp MD Lupe Carney, [place]  HPI:  Joanne Hansen is a 76 y.o.  thin and fit appearing married Caucasian female mother of 1 child who recently relocated from the [place] near [place] to [place] to be closer to family.  She is retired from doing clerical work.  She was referred by Dr. Ethlyn Gallery , her PCP, for episodes of dizziness and remote PAF.  I last saw her in the office 01/20/2021.  She did see Coletta Memos, NP in the office 09/13/2019. Her only risk factor is mild hyperlipidemia on statin therapy.  Her father did have a myocardial infarction at age 85 had bypass surgery.  She is never smoked.  She is never had a heart attack or stroke.  She denies chest pain or shortness of breath but she does have fatigue.  She developed breast cancer in September and had lumpectomy and has just finished her last round of chemotherapy and scheduled to start radiation therapy.  She feels fatigued.  She said 6 episodes of dizziness since starting chemotherapy without frank syncope.  She did have PAF demonstrated by Holter monitor in [place] in 2014.  She was followed by a cardiologist there who did not anticoagulate her.  Since that time she has had infrequent episodes of brief tachypalpitations.   I did order an event monitor but resulted on 08/05/2019 that did not show any PAF.  She had short runs of PSVT.  A 2D echo performed 08/16/2019 was essentially normal.  Unfortunately she did contract herpes zoster on the left side of her face which she is significantly symptomatic from but is slowly improving on Valtrex.  She did have her last radiation treatment for her breast cancer 09/21/2019.   Since I saw her a year ago she continues to do well.  She gets occasional palpitations.  She walks several miles a day.  She is completely  asymptomatic.  Current Meds  Medication Sig   CALCIUM-VITAMIN D PO Take 2 capsules by mouth daily. Vit D 1000 with minerals   carboxymethylcellulose (REFRESH PLUS) 0.5 % SOLN 1 drop as needed.   Cyanocobalamin (VITAMIN B 12 PO) Take 1,000 mg by mouth daily. sublingal   estradiol (ESTRACE) 0.1 MG/GM vaginal cream Place 1 Applicatorful vaginally at bedtime.   methylcellulose oral powder Take by mouth every evening.    metoprolol tartrate (LOPRESSOR) 25 MG tablet TAKE 1/2 (ONE-HALF) TABLET BY MOUTH TWICE DAILY   NONFORMULARY OR COMPOUNDED ITEM Amitriptyline 2.5%/ gabapentin 2.5%/ baclofen 2.5% in vaginal cream. Place 1g twice a day in the vagina/ vulvar area.  Dispense 60g with 5 refills.   simvastatin (ZOCOR) 40 MG tablet Take 1 tablet daily, please schedule appt by December     Allergies  Allergen Reactions   Penicillins Swelling   Sulfa Antibiotics Nausea And Vomiting    Social History   Socioeconomic History   Marital status: Married    Spouse name: Not on file   Number of children: Not on file   Years of education: Not on file   Highest education level: Not on file  Occupational History   Not on file  Tobacco Use   Smoking status: Never   Smokeless tobacco: Never  Vaping Use   Vaping Use: Never used  Substance and Sexual Activity   Alcohol use: Yes    Comment: Cocktails-3 days  per week   Drug use: Never    Types: Amphetamines   Sexual activity: Not Currently    Birth control/protection: Surgical  Other Topics Concern   Not on file  Social History Narrative   Not on file   Social Determinants of Health   Financial Resource Strain: Low Risk  (08/03/2021)   Overall Financial Resource Strain (CARDIA)    Difficulty of Paying Living Expenses: Not hard at all  Food Insecurity: No Food Insecurity (08/03/2021)   Hunger Vital Sign    Worried About Running Out of Food in the Last Year: Never true    Ran Out of Food in the Last Year: Never true  Transportation Needs:  No Transportation Needs (08/03/2021)   PRAPARE - Hydrologist (Medical): No    Lack of Transportation (Non-Medical): No  Physical Activity: Sufficiently Active (08/03/2021)   Exercise Vital Sign    Days of Exercise per Week: 5 days    Minutes of Exercise per Session: 40 min  Stress: No Stress Concern Present (08/03/2021)   [place]    Feeling of Stress : Not at all  Social Connections: Moderately Integrated (08/03/2021)   Social Connection and Isolation Panel [NHANES]    Frequency of Communication with Friends and Family: More than three times a week    Frequency of Social Gatherings with Friends and Family: More than three times a week    Attends Religious Services: Never    Marine scientist or Organizations: Yes    Attends Music therapist: More than 4 times per year    Marital Status: Married  Human resources officer Violence: Not At Risk (08/03/2021)   Humiliation, Afraid, Rape, and Kick questionnaire    Fear of Current or Ex-Partner: No    Emotionally Abused: No    Physically Abused: No    Sexually Abused: No     Review of Systems: General: negative for chills, fever, night sweats or weight changes.  Cardiovascular: negative for chest pain, dyspnea on exertion, edema, orthopnea, palpitations, paroxysmal nocturnal dyspnea or shortness of breath Dermatological: negative for rash Respiratory: negative for cough or wheezing Urologic: negative for hematuria Abdominal: negative for nausea, vomiting, diarrhea, bright red blood per rectum, melena, or hematemesis Neurologic: negative for visual changes, syncope, or dizziness All other systems reviewed and are otherwise negative except as noted above.    Blood pressure 116/60, pulse (!) 54, height '5\' 2"'$  (1.575 m), weight 117 lb 9.6 oz (53.3 kg), SpO2 96 %.  General appearance: alert and no distress Neck: no adenopathy, no  carotid bruit, no JVD, supple, symmetrical, trachea midline, and thyroid not enlarged, symmetric, no tenderness/mass/nodules Lungs: clear to auscultation bilaterally Heart: regular rate and rhythm, S1, S2 normal, no murmur, click, rub or gallop Extremities: extremities normal, atraumatic, no cyanosis or edema Pulses: 2+ and symmetric Skin: Skin color, texture, turgor normal. No rashes or lesions Neurologic: Grossly normal  EKG sinus bradycardia at 54 without ST or T wave changes.  Personally reviewed this EKG.  ASSESSMENT AND PLAN:   Hyperlipidemia History of hyperlipidemia on statin therapy with lipid profile performed 02/06/2021 revealing total cholesterol 176, LDL 79 HDL 81.  Palpitations History of palpitations with PAF demonstrated 10 years ago in [place] however event monitor performed 08/05/2019 did not show PAF but did show short runs of PSVT.  2D echo was normal at that time as well.  She is otherwise asymptomatic.  She walks  2 to 3 miles 5 days a week.     Lorretta Harp MD FACP,FACC,FAHA, [facility] 05/05/2022 11:53 AM

## 2022-05-05 NOTE — Patient Instructions (Signed)
Medication Instructions:  Your physician recommends that you continue on your current medications as directed. Please refer to the Current Medication list given to you today.  *If you need a refill on your cardiac medications before your next appointment, please call your pharmacy*   Follow-Up: At [facility], you and your health needs are our priority.  As part of our continuing mission to provide you with exceptional heart care, we have created designated Provider Care Teams.  These Care Teams include your primary Cardiologist (physician) and Advanced Practice Providers (APPs -  Physician Assistants and Nurse Practitioners) who all work together to provide you with the care you need, when you need it.  We recommend signing up for the patient portal called "MyChart".  Sign up information is provided on this After Visit Summary.  MyChart is used to connect with patients for Virtual Visits (Telemedicine).  Patients are able to view lab/test results, encounter notes, upcoming appointments, etc.  Non-urgent messages can be sent to your provider as well.   To learn more about what you can do with MyChart, go to [email or web address].    Your next appointment:   12 month(s)  The format for your next appointment:   In Person  Provider:   Jonathan Berry, MD   

## 2022-05-05 NOTE — Assessment & Plan Note (Signed)
History of hyperlipidemia on statin therapy with lipid profile performed 02/06/2021 revealing total cholesterol 176, LDL 79 HDL 81.

## 2022-05-07 ENCOUNTER — Encounter: Payer: Self-pay | Admitting: Physical Therapy

## 2022-05-07 ENCOUNTER — Ambulatory Visit: Payer: Medicare Other | Attending: Obstetrics and Gynecology | Admitting: Physical Therapy

## 2022-05-07 DIAGNOSIS — M6281 Muscle weakness (generalized): Secondary | ICD-10-CM | POA: Insufficient documentation

## 2022-05-07 DIAGNOSIS — R102 Pelvic and perineal pain: Secondary | ICD-10-CM | POA: Insufficient documentation

## 2022-05-07 DIAGNOSIS — R252 Cramp and spasm: Secondary | ICD-10-CM | POA: Diagnosis present

## 2022-05-07 NOTE — Therapy (Signed)
OUTPATIENT PHYSICAL THERAPY TREATMENT NOTE   Patient Name: Joanne Hansen MRN: [id number] DOB:01/26/1946, 76 y.o., female Today's Date: 05/07/2022  PCP: Caren Macadam, MD REFERRING PROVIDER: Jaquita Folds, MD  END OF SESSION:   PT End of Session - 05/07/22 1155     Visit Number 2    Date for PT Re-Evaluation 06/30/22    Authorization Type Medicare    Authorization - Visit Number 2    Authorization - Number of Visits 10    PT Start Time 1100    PT Stop Time 1150    PT Time Calculation (min) 50 min    Activity Tolerance Patient tolerated treatment well    Behavior During Therapy [facility] for tasks assessed/performed             Past Medical History:  Diagnosis Date   Arthritis    Breast cancer ([place])    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   Family history of brain cancer    Family history of breast cancer    Family history of colon cancer    Fecal incontinence    05-16-2019 per pt only a little leakage but controllable (was in clinical trial for [facility] without benefit.)   Frequent headaches    History of syncope    pre-syncope  08/ 2015  per pt no issue with this since    Hyperlipidemia    Malignant neoplasm of upper-inner quadrant of right breast in female, estrogen receptor negative [facility]) oncologist-- dr Jana Hakim  dr moody   dx 08/ 2020--  invasive ductal carcinoma,  05-13-2019  s/p right breast lumpectomy    PAF (paroxysmal atrial fibrillation) ([place]) (05-16-2019   pt from [place], recently moved to [place] to be near daughter, has not established a cardiology yet--  previously seen by dr Lowella Dandy cossu (last office note dated 02-28-2018 scanned in epic)---  echo 04-15-2014  ef 55-60%, G1DD mild AR without stenosis, RVSp 7mHg   Personal history of radiation therapy    stopped 09/22/19    Rectal cyst    Thyroid goiter    pt ultrasound in epic 06-04-2014 , nodules, no bx done   Urinary incontinence, mixed    Past Surgical History:  Procedure  Laterality Date   ABDOMINAL HYSTERECTOMY  1978   w/  RSO  and APPENDECTOMY   BREAST BIOPSY Right 08/2009   benign   BREAST LUMPECTOMY WITH RADIOACTIVE SEED AND SENTINEL LYMPH NODE BIOPSY Right 05/15/2019   Procedure: RIGHT BREAST LUMPECTOMY WITH RADIOACTIVE SEED AND SENTINEL LYMPH NODE [facility]  Surgeon: CErroll Luna MD;  [place]  Service: General;  Laterality: Right;   BREAST SURGERY  2011   biospy of right breast   CATARACT EXTRACTION W/ INTRAOCULAR LENS  IMPLANT, BILATERAL  2004   COLONOSCOPY  11/21/2019   FLEXIBLE SIGMOIDOSCOPY N/A 05/18/2019   Procedure: FLEXIBLE SIGMOIDOSCOPY,  TRANSANAL EXCISION inclusion cyst;  Surgeon: TLeighton Ruff MD;  [place]  Service: General;  Laterality: N/A;   gluteus medius repair  2017   LUMBAR LAMINECTOMY  2013   L5 -- S1   PORT-A-CATH REMOVAL Right 09/27/2019   Procedure: PORT REMOVAL;  Surgeon: CErroll Luna MD;  [place]  Service: General;  Laterality: Right;   PORTACATH PLACEMENT Right 05/15/2019   Procedure: INSERTION PORT-A-CATH WITH ULTRASOUND;  Surgeon: CErroll Luna MD;  [place]  Service: General;  Laterality: Right;   ROTATOR CUFF REPAIR Left 2001  Patient Active Problem List   Diagnosis Date Noted   Palpitations 01/20/2021   Anemia, macrocytic 09/25/2019   Paroxysmal atrial fibrillation ([place]) 08/02/2019   Dizziness 08/02/2019   Lower extremity edema 08/02/2019   Genetic testing 05/16/2019   Malignant neoplasm of upper-inner quadrant of right breast in female, estrogen receptor negative ([place]) 05/07/2019   Family history of breast cancer    Family history of colon cancer    Family history of brain cancer    Hyperlipidemia 11/08/2018   Fecal incontinence 11/08/2018   Urinary, incontinence, stress female 11/08/2018   REFERRING DIAG:  W09.811 (ICD-10-CM) - Levator spasm  R15.9 (ICD-10-CM) - Incontinence of feces,  unspecified fecal incontinence type  N39.3 (ICD-10-CM) - SUI (stress urinary incontinence, female      THERAPY DIAG:  Cramp and spasm   Muscle weakness (generalized)   Pelvic pain   Rationale for Evaluation and Treatment Rehabilitation   ONSET DATE: 2021   SUBJECTIVE:                                                                                                                                                                                            SUBJECTIVE STATEMENT: I am struggling with my back. My tailbone is hurting.   PAIN:  Are you having pain? Yes: NPRS scale: 8/10 Pain location: tailbone pain Pain description: deep  Aggravating factors: walking, sitting and makes it hard to go into standing Relieving factors: nothing   PAIN:  Are you having pain? Yes NPRS scale: 10/10 Pain location: Vaginal lower right of introitus   Pain type: burning Pain description: intermittent    Aggravating factors: penile penetration, vaginal exam Relieving factors: no penile penetration   PRECAUTIONS: Other: breast cancer   WEIGHT BEARING RESTRICTIONS No   FALLS:  Has patient fallen in last 6 months? No   LIVING ENVIRONMENT: Lives with: lives with their spouse     OCCUPATION: retired   PLOF: Independent   PATIENT GOALS ability to have intercourse without pain;    PERTINENT HISTORY:  Abdominal hysterectomy 1978; right breast cancer estrogen receptor negative; lumbar laminectomy   BOWEL MOVEMENT Pain with bowel movement: No Type of bowel movement:Type (Bristol Stool Scale) type 3, type 2, Frequency daily, and Strain Yes Fully empty rectum: Yes: but sometimes has incomplete evacuation.  Leakage: Occurs: after a bowel movement             Consistency with leakage: liquid Pads: Yes: 3-4 Fiber supplement: Yes: citrucel   URINATION Pain with urination: No Fully empty bladder: Yes: dribbles after urination Stream: Strong Urgency: Yes: if she waits too  long Frequency: 2-3  hours Leakage: Coughing, Sneezing, Exercise, and Lifting Pads: Yes: 3-4 liners   INTERCOURSE Pain with intercourse: Initial Penetration due to dryness Ability to have vaginal penetration:  No Climax: no  Marinoff Scale: 3/3 Has tried pelvic physical therapy for about 6 months (at [facility]). Used vaginal dilators and vibrator.   PREGNANCY Vaginal deliveries 1 Tearing No   PROLAPSE Rectocele small and distal       OBJECTIVE:    DIAGNOSTIC FINDINGS:  PVR of 12 ml ; biopsy was negative.    COGNITION:            Overall cognitive status: Within functional limits for tasks assessed                          SENSATION:            Light touch: Appears intact            Proprioception: Appears intact                  POSTURE: No Significant postural limitations               PELVIC ALIGNMENT:   LUMBARAROM/PROMlimited by 50-75% due to pain     LOWER EXTREMITY ROM:   Passive ROM Right eval Left eval  Hip extension -5 -5  Hip external rotation 50 35   (Blank rows = not tested)   LOWER EXTREMITY MMT:   MMT Right eval Left eval  Hip extension 3/5 3/5  Hip abduction 3/5 3/5  Hip internal rotation 4/5 4/5  Hip external rotation 4/5 4/5     PALPATION:      External Perineal Exam dryness notided                             Internal Pelvic Floor stitch noticed at 6 O'Clock, Tenderness located with Qtip inserted into the vagina. Therapist placed her index finger into the vaginal canal 1/4 of an inch but patient had pain so did not go further.    Patient confirms identification and approves PT to assess internal pelvic floor and treatment Yes   PELVIC MMT:   MMT eval  Vaginal 2/5  Internal Anal Sphincter    External Anal Sphincter    Puborectalis    Diastasis Recti    (Blank rows = not tested)        The anal strength not tested due to her having some bleeding rectally yesterday.  TONE: increased   PROLAPSE: none   TODAY'S  TREATMENT  05/07/2022 Manual: Soft tissue mobilization:to left hip adductor Internal pelvic floor techniques:No emotional/communication barriers or cognitive limitation. Patient is motivated to learn. Patient understands and agrees with treatment goals and plan. PT explains patient will be examined in standing, sitting, and lying down to see how their muscles and joints work. When they are ready, they will be asked to remove their underwear so PT can examine their perineum. The patient is also given the option of providing their own chaperone as one is not provided in our facility. The patient also has the right and is explained the right to defer or refuse any part of the evaluation or treatment including the internal exam. With the patient's consent, PT will use one gloved finger to gently assess the muscles of the pelvic floor, seeing how well it contracts and relaxes and if there is muscle symmetry. After, the patient will  get dressed and PT and patient will discuss exam findings and plan of care. PT and patient discuss plan of care, schedule, attendance policy and HEP activities.  Going through the rectum working on the iliococcygeus, coccygeus, puborectalis, and obturator internist with one finger internally and other externally with patient in left sidely Going through the rectum distracting the coccyx with one finger internally and externally Self-care: Educated patient on the anatomy of the pelvis and muscle and how it connects to the vaginal musculature.     EVAL Date: 04/07/2022 HEP established-see below      PATIENT EDUCATION:  04/07/2022 Education details: vaginal moisturizers Person educated: Patient Education method: Explanation, Demonstration, and Handouts Education comprehension: verbalized understanding     HOME EXERCISE PROGRAM: See above   ASSESSMENT:   CLINICAL IMPRESSION: Patient is a 76 y.o. female who was seen today for physical therapy evaluation and treatment for  SUI, fecal leakage, and levator spasm. Patient is reporting tailbone pain and today we looked into it. Patient was able to sit with less pain. She was able to stand and put legs in pants with less difficulty after treatment. Patient vaginal area is feeling better with her creams she is using. Patient will benefit from skilled therapy to improve pain and function.      OBJECTIVE IMPAIRMENTS decreased activity tolerance, decreased coordination, decreased endurance, decreased strength, increased fascial restrictions, increased muscle spasms, impaired tone, and pain.    ACTIVITY LIMITATIONS continence and toileting   PARTICIPATION LIMITATIONS: interpersonal relationship   PERSONAL FACTORS Sex, Time since onset of injury/illness/exacerbation, and 3+ comorbidities:    Abdominal hysterectomy 1978; right breast cancer estrogen receptor negative; lumbar laminectomy are also affecting patient's functional outcome.    REHAB POTENTIAL: Excellent   CLINICAL DECISION MAKING: Evolving/moderate complexity   EVALUATION COMPLEXITY: Moderate     GOALS: Goals reviewed with patient? Yes   SHORT TERM GOALS: Target date: 05/05/2022   Patient is independent with pelvic drop and diaphragmatic breathing to relax her pelvic floor.  Baseline: Goal status: INITIAL   2.  Patient understands how to massage the external perineal area to relax the pelvic floor.  Baseline:  Goal status: INITIAL   3.  Patient able to have the Q-tip inserted into the vaginal canal without pain.  Baseline:  Goal status: INITIAL   4.  Patient is using the vaginal vibrator on the tissue to improve blood flow to the tissue and relax the tissue.  Baseline:  Goal status: INITIAL     LONG TERM GOALS: Target date: 06/30/2022    Patient is independent with using the vaginal dilators and on the largest one with pain </= 1/10.  Baseline:  Goal status: INITIAL   2.  Patient is able to have penile penetration with pain level </= 1/10 and  possible with the assistance of the [place].  Baseline:  Goal status: INITIAL   3.  Patient reports her stool leakage has improved >/= 75% due to improved strength and coordination of the pelvic floor.  Baseline:  Goal status: INITIAL   4.  Patient reports she is able to hold her urine when she has the urge to urinate and walk to the bathroom.  Baseline:  Goal status: INITIAL   5.  Patient reports her urinary leakage with coughing, sneezing, exercise and lifting improved >/= 75% due to the improved mobility of the pelvic floor muscles.  Baseline:  Goal status: INITIAL       PLAN: PT FREQUENCY: 1x/week   PT DURATION:  12 weeks   PLANNED INTERVENTIONS: Therapeutic exercises, Therapeutic activity, Neuromuscular re-education, Patient/Family education, Self Care, Dry Needling, Electrical stimulation, Taping, Biofeedback, and Manual therapy   PLAN FOR NEXT SESSION: manual work externally to the perineal area and show the patient, see how it went after the manual work on the coccyx.   Earlie Counts, PT 05/07/22 11:56 AM

## 2022-05-10 ENCOUNTER — Other Ambulatory Visit: Payer: Self-pay | Admitting: *Deleted

## 2022-05-10 DIAGNOSIS — C50211 Malignant neoplasm of upper-inner quadrant of right female breast: Secondary | ICD-10-CM

## 2022-05-11 ENCOUNTER — Encounter: Payer: Self-pay | Admitting: Hematology and Oncology

## 2022-05-11 ENCOUNTER — Other Ambulatory Visit: Payer: Self-pay

## 2022-05-11 ENCOUNTER — Inpatient Hospital Stay (HOSPITAL_BASED_OUTPATIENT_CLINIC_OR_DEPARTMENT_OTHER): Payer: Medicare Other | Admitting: Hematology and Oncology

## 2022-05-11 ENCOUNTER — Inpatient Hospital Stay: Payer: Medicare Other | Attending: Hematology and Oncology

## 2022-05-11 VITALS — BP 113/71 | HR 67 | Temp 98.1°F | Resp 16 | Ht 62.0 in | Wt 117.7 lb

## 2022-05-11 DIAGNOSIS — Z171 Estrogen receptor negative status [ER-]: Secondary | ICD-10-CM | POA: Diagnosis not present

## 2022-05-11 DIAGNOSIS — M8580 Other specified disorders of bone density and structure, unspecified site: Secondary | ICD-10-CM | POA: Diagnosis not present

## 2022-05-11 DIAGNOSIS — C50211 Malignant neoplasm of upper-inner quadrant of right female breast: Secondary | ICD-10-CM | POA: Insufficient documentation

## 2022-05-11 LAB — CMP (CANCER CENTER ONLY)
ALT: 15 U/L (ref 0–44)
AST: 20 U/L (ref 15–41)
Albumin: 4.5 g/dL (ref 3.5–5.0)
Alkaline Phosphatase: 55 U/L (ref 38–126)
Anion gap: 4 — ABNORMAL LOW (ref 5–15)
BUN: 18 mg/dL (ref 8–23)
CO2: 31 mmol/L (ref 22–32)
Calcium: 9.6 mg/dL (ref 8.9–10.3)
Chloride: 105 mmol/L (ref 98–111)
Creatinine: 0.71 mg/dL (ref 0.44–1.00)
GFR, Estimated: >60 mL/min (ref >60)
Glucose, Bld: 100 mg/dL — ABNORMAL HIGH (ref 70–99)
Potassium: 4 mmol/L (ref 3.5–5.1)
Sodium: 140 mmol/L (ref 135–145)
Total Bilirubin: 0.7 mg/dL (ref 0.3–1.2)
Total Protein: 6.9 g/dL (ref 6.5–8.1)

## 2022-05-11 LAB — CBC WITH DIFFERENTIAL (CANCER CENTER ONLY)
Abs Immature Granulocytes: 0.01 10*3/uL (ref 0.00–0.07)
Basophils Absolute: 0 10*3/uL (ref 0.0–0.1)
Basophils Relative: 1 %
Eosinophils Absolute: 0.1 10*3/uL (ref 0.0–0.5)
Eosinophils Relative: 2 %
HCT: 40.5 % (ref 36.0–46.0)
Hemoglobin: 13.7 g/dL (ref 12.0–15.0)
Immature Granulocytes: 0 %
Lymphocytes Relative: 25 %
Lymphs Abs: 1.4 10*3/uL (ref 0.7–4.0)
MCH: 33.8 pg (ref 26.0–34.0)
MCHC: 33.8 g/dL (ref 30.0–36.0)
MCV: 100 fL (ref 80.0–100.0)
Monocytes Absolute: 0.5 10*3/uL (ref 0.1–1.0)
Monocytes Relative: 9 %
Neutro Abs: 3.7 10*3/uL (ref 1.7–7.7)
Neutrophils Relative %: 63 %
Platelet Count: 215 10*3/uL (ref 150–400)
RBC: 4.05 MIL/uL (ref 3.87–5.11)
RDW: 11.9 % (ref 11.5–15.5)
WBC Count: 5.8 10*3/uL (ref 4.0–10.5)
nRBC: 0 % (ref 0.0–0.2)

## 2022-05-11 NOTE — Progress Notes (Signed)
[place]  Telephone:([phone]) [phone] Fax:(336) [phone]    ID: Joanne Hansen DOB: 1945/08/31  MR#: [id number]  YTK#:[id number]  Patient Care Team: Joanne Conners, MD as PCP - General (Family Medicine) Joanne Harp, MD as PCP - Cardiology (Cardiology) Magrinat, Virgie Dad, MD (Inactive) as Consulting Physician (Oncology) Joanne Luna, MD as Consulting Physician (General Surgery) Joanne Rudd, MD as Consulting Physician (Radiation Oncology) Joanne Ruff, MD as Consulting Physician (General Surgery) Joanne Loser, MD as Consulting Physician (Urology) Joanne Broad, MD as Consulting Physician (Physical Medicine and Rehabilitation) Joanne Pearson, MD as Consulting Physician (Obstetrics and Gynecology) OTHER MD:  CHIEF COMPLAINT: triple negative invasive ductal carcinoma  CURRENT TREATMENT: Observation  INTERVAL HISTORY: Joanne Hansen returns today for follow-up of her triple negative invasive ductal carcinoma She is doing quite well since her last visit.  She continues to struggle with vaginal dryness hence has recently started back on estrogen which she applies mostly on the vulvar based on recommendations from her gynecologist.  She tells me that her gynecologist had biopsied the area of irritation and did not find any abnormal pathology.  She otherwise has some aches and pains in the lower lumbar spine and coccyx and has an appointment with orthopedics coming up.  She denies any changes in the breast.  No cough, chest pain, shortness of breath.  No change in bowel or urinary habits.  Rest of the pertinent 10 point ROS reviewed and negative  HISTORY OF CURRENT ILLNESS: From the original intake note:  Joanne Hansen had routine screening mammography on 04/10/2019 showing a possible abnormality in the right breast. She underwent unilateral right diagnostic mammography with tomography and right breast ultrasonography at [facility] on 04/12/2019 showing:  Breast Density Category B. Spot compression views of the medial right breast, including tomography confirm an irregular mass spanning approximately 0.9 cm. On physical exam, no mass is palpated in the upper inner quadrant of the right breast. Targeted ultrasound is performed, showing a single irregular mass versus two immediately adjacent small irregular masses, that in total measure 1.1 cm. Individually, the two irregular masses measure 0.8 x 0.4 x 0.5 cm and 0.4 x 0.4 x 0.4 cm, respectively. Mass(es) are at 1 o'clock position 3 cm from the nipple. Ultrasound of the right axilla is negative for lymphadenopathy.  Accordingly on 04/16/2019 she proceeded to biopsy of the right breast area in question. The pathology from this procedure showed ([id number]): invasive ductal carcinoma, grade II. Prognostic indicators significant for: estrogen receptor, 0% negative and progesterone receptor, 0% negative,. Proliferation marker Ki67 at 20%. HER2 negative (1+) by immunohistochemistry.  The patient's subsequent history is as detailed below.   PAST MEDICAL HISTORY: Past Medical History:  Diagnosis Date   Arthritis    Breast cancer ([place])    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   Family history of brain cancer    Family history of breast cancer    Family history of colon cancer    Fecal incontinence    05-16-2019 per pt only a Joanne leakage but controllable (was in clinical trial for [place]anne [place] without benefit.)   Frequent headaches    History of syncope    pre-syncope  08/ 2015  per pt no issue with this since    Hyperlipidemia    Malignant neoplasm of upper-inner quadrant of right breast in female, estrogen receptor negative [place]anne [place]) oncologist-- [place]  [place]   dx 08/ 2020--  invasive ductal carcinoma,  05-13-2019  s/p right breast  lumpectomy    PAF (paroxysmal atrial fibrillation) ([place]) (05-16-2019   pt from [place], recently moved to [place] to be near daughter, has not established a cardiology  yet--  previously seen by Joanne [place] (last office note dated 02-28-2018 scanned in epic)---  echo 04-15-2014  ef 55-60%, G1DD mild AR without stenosis, RVSp 57mHg   Personal history of radiation therapy    stopped 09/22/19    Rectal cyst    Thyroid goiter    pt ultrasound in epic 06-04-2014 , nodules, no bx done   Urinary incontinence, mixed     PAST SURGICAL HISTORY: Past Surgical History:  Procedure Laterality Date   ABDOMINAL HYSTERECTOMY  1978   w/  RSO  and APPENDECTOMY   BREAST BIOPSY Right 08/2009   benign   BREAST LUMPECTOMY WITH RADIOACTIVE SEED AND SENTINEL LYMPH NODE BIOPSY Right 05/15/2019   Procedure: RIGHT BREAST LUMPECTOMY WITH RADIOACTIVE SEED AND SENTINEL LYMPH NODE MAPPING;  Surgeon: CErroll Luna MD;  [facility]  Service: General;  Laterality: Right;   BREAST SURGERY  2011   biospy of right breast   CATARACT EXTRACTION [place] BILATERAL  2004   COLONOSCOPY  11/21/2019   FLEXIBLE SIGMOIDOSCOPY N/A 05/18/2019   Procedure: FLEXIBLE SIGMOIDOSCOPY,  TRANSANAL EXCISION inclusion cyst;  Surgeon: TLeighton Ruff MD;  [facility] [facility]  Service: General;  Laterality: N/A;   gluteus medius repair  2017   LUMBAR LAMINECTOMY  2013   L5 -- S1   PORT-A-CATH REMOVAL Right 09/27/2019   Procedure: PORT REMOVAL;  Surgeon: CErroll Luna MD;  [facility]  Service: General;  Laterality: Right;   PORTACATH PLACEMENT Right 05/15/2019   Procedure: INSERTION PORT-A-CATH WITH ULTRASOUND;  Surgeon: CErroll Luna MD;  [place] [facility]  Service: General;  Laterality: Right;   ROTATOR CUFF REPAIR Left 2001    FAMILY HISTORY: Family History  Problem Relation Age of Onset   COPD Mother    Cancer Mother 625      colon   Arthritis Mother    Hypertension Mother    Miscarriages / SKoreaMother    Stroke Mother 73  Alzheimer's disease Mother 82  Heart disease  Father    Heart attack Father 263  Lung disease Father    Arthritis Sister    Breast cancer Sister 759      Lumpectomy   Diabetes Maternal Grandmother    Breast cancer Niece 373      Louise's Daughter   Cervical cancer Niece 436 The patient's father died at age 2169from a heart attack.  The patient's mother died at age [id number] she had a gastrointestinal cancer diagnosed age 74  The patient has 1 sister with breast cancer and one niece with breast cancer diagnosed at age 74   GYNECOLOGIC HISTORY:  No LMP recorded. Patient has had a hysterectomy. 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  Her husband GGermain Osgood(goes by "GRonald Pippins) is a retired eChief Financial Officer  Their daughter KJohnanna Schneiders(currently divorced) lives in [place]  The patient has no grandchildren.  She is not a church attender   ADVANCED DIRECTIVES: The patient's husband is her healthcare power of attorney  HEALTH MAINTENANCE: Social History   Tobacco Use   Smoking status: Never   Smokeless tobacco: Never  Vaping Use   Vaping Use: Never used  Substance Use Topics   Alcohol use: Yes    Comment: Cocktails-3 days per week   Drug use: Never    Types: Amphetamines    Colonoscopy: March 2021/  PAP: Status post hysterectomy  Bone density: Pending   Allergies  Allergen Reactions   Penicillins Swelling   Sulfa Antibiotics Nausea And Vomiting     OBJECTIVE: white woman who appears well  Vitals:   05/11/22 1431  BP: 113/71  Pulse: 67  Resp: 16  Temp: 98.1 F (36.7 C)  SpO2: 98%    Wt Readings from Last 3 Encounters:  05/11/22 117 lb 11.2 oz (53.4 kg)  05/05/22 117 lb 9.6 oz (53.3 kg)  03/26/22 120 lb (54.4 kg)   Body mass index is 21.53 kg/m.    ECOG FS:1 -  Symptomatic but completely ambulatory   Sclerae unicteric, EOMs intact    LAB RESULTS:  CMP     Component Value Date/Time   NA 140 05/20/2021 1317   K 4.1 05/20/2021 1317   CL 104 05/20/2021 1317   CO2 25 05/20/2021 1317   GLUCOSE 91 05/20/2021 1317   BUN 13 05/20/2021 1317   CREATININE 0.79 05/20/2021 1317   CREATININE 0.77 05/07/2019 1547   CALCIUM 10.1 05/20/2021 1317   PROT 7.1 05/20/2021 1317   PROT 6.6 02/06/2021 0845   ALBUMIN 4.2 05/20/2021 1317   ALBUMIN 4.7 02/06/2021 0845   AST 20 05/20/2021 1317   AST 19 05/07/2019 1547   ALT 16 05/20/2021 1317   ALT 13 05/07/2019 1547   ALKPHOS 61 05/20/2021 1317   BILITOT 0.7 05/20/2021 1317   BILITOT 0.7 02/06/2021 0845   BILITOT 0.5 05/07/2019 1547   GFRNONAA >60 05/20/2021 1317   GFRNONAA >60 05/07/2019 1547   GFRAA >60 05/21/2020 1413   GFRAA >60 05/07/2019 1547    No results found for: "TOTALPROTELP", "ALBUMINELP", "A1GS", "A2GS", "BETS", "BETA2SER", "GAMS", "MSPIKE", "SPEI"  No results found for: "KPAFRELGTCHN", "LAMBDASER", "KAPLAMBRATIO"  Lab Results  Component Value Date   WBC 5.8 05/11/2022   NEUTROABS 3.7 05/11/2022   HGB 13.7 05/11/2022   HCT 40.5 05/11/2022   MCV 100.0 05/11/2022   PLT 215 05/11/2022    No results found for: "LABCA2"  No components found for: "WSFKCL275"  No results for input(s): "INR" in the last 168 hours.  No results found for: "LABCA2"  No results found for: "TZG017"  No results found for: "CAN125"  No results found for: "CAN153"  No results found for: "CA2729"  No components found for: "HGQUANT"  No results found for: "CEA1", "CEA" / No results found for: "CEA1", "CEA"   No results found for: "AFPTUMOR"  No results found for: "CHROMOGRNA"  No results found for: "HGBA", "HGBA2QUANT", "HGBFQUANT", "HGBSQUAN" (Hemoglobinopathy evaluation)   No results found for: "LDH"  Lab Results  Component Value Date   IRON 117 12/20/2019   TIBC 298 12/20/2019    IRONPCTSAT 39 12/20/2019   (Iron and TIBC)  Lab Results  Component Value Date   FERRITIN 92 12/20/2019    Urinalysis    Component Value Date/Time   BILIRUBINUR Negative 02/27/2020 1123   PROTEINUR Negative 02/27/2020 1123   UROBILINOGEN 0.2 02/27/2020 1123   NITRITE Negative 02/27/2020 1123   LEUKOCYTESUR Trace (A) 02/27/2020 1123    STUDIES:  MM DIAG BREAST TOMO BILATERAL  Result  Date: 04/14/2022 CLINICAL DATA:  History of RIGHT lumpectomy with radiation in 2020. EXAM: DIGITAL DIAGNOSTIC BILATERAL MAMMOGRAM WITH TOMOSYNTHESIS TECHNIQUE: Bilateral digital diagnostic mammography and breast tomosynthesis was performed. COMPARISON:  Previous exam(s). ACR Breast Density Category b: There are scattered areas of fibroglandular density. FINDINGS: Post operative changes are seen in the RIGHTbreast. No suspicious mass, distortion, or microcalcifications are identified to suggest presence of malignancy. IMPRESSION: No mammographic evidence for malignancy. RECOMMENDATION: Per protocol, as the patient is now 2 or more years status post lumpectomy, she is eligible for annual screening or diagnostic mammography in 1 year. In our discussion, patient may elect to have bilateral diagnostic mammogram in 1 year. I have discussed the findings and recommendations with the patient. If applicable, a reminder letter will be sent to the patient regarding the next appointment. BI-RADS CATEGORY  2: Benign. Electronically Signed   By: Nolon Nations M.D.   On: 04/14/2022 10:45    ELIGIBLE FOR AVAILABLE RESEARCH PROTOCOL: NO   ASSESSMENT: 76 y.o. [place], [place] woman status post right breast upper quadrant biopsy for an mT1b (or single T1c)  invasive ductal carcinoma, grade 2, triple negative, with an MIB-1-1 of 20%.  (1) Genetic testing on 05/07/2019 through the [facility] Breast Cancer STAT Panel + Common Hereditary cancer panel found no pathogenic mutations. The STAT Breast cancer panel offered by [facility] includes  sequencing and rearrangement analysis for the following 9 genes:  ATM, BRCA1, BRCA2, CDH1, CHEK2, PALB2, PTEN, STK11 and TP53.  The Common Hereditary Cancers Panel offered by [facility] includes sequencing and/or deletion duplication testing of the following 47 genes: APC, ATM, AXIN2, BARD1, BMPR1A, BRCA1, BRCA2, BRIP1, CDH1, CDKN2A (p14ARF), CDKN2A (p16INK4a), CKD4, CHEK2, CTNNA1, DICER1, EPCAM (Deletion/duplication testing only), GREM1 (promoter region deletion/duplication testing only), KIT, MEN1, MLH1, MSH2, MSH3, MSH6, MUTYH, NBN, NF1, NHTL1, PALB2, PDGFRA, PMS2, POLD1, POLE, PTEN, RAD50, RAD51C, RAD51D, SDHB, SDHC, SDHD, SMAD4, SMARCA4. STK11, TP53, TSC1, TSC2, and VHL.  The following genes were evaluated for sequence changes only: SDHA and HOXB13 c.251G>A variant only.  (2) s/p right lumpectomy and sentinel lymph node sampling 05/15/2019 for a pT1b pN0 stage IB invasive ductal carcinoma, grade 3, with negative margins.  (a) a total of 2 sentinel lymph nodes were removed  (3) adjuvant chemotherapy consisting of cyclophosphamide and docetaxel every 21 days x 4, started 05/29/2019, completed 07/31/2019  (4) adjuvant radiation 08/27/2019 - 09/21/2019 The right breast was treated to 42.56 Gy in 16 fractions followed by an 8 Gy boost to the surgical cavity.  (5) opted against prophylactic antiestrogens  (a) bone density 04/10/2020 shows a T score of -1.9  (6) severe vaginal atrophy with dyspareunia  (a) Estring prescribed 05/20/2021- not affordable    PLAN:  Patient is here for follow-up.  No new concerns for recurrence.  Physical examination today without any palpable masses or regional adenopathy.  She is continuing on estrogen cream for vaginal dryness/vulvar atrophy.  She is also going back to physical therapy for pelvic floor rehabilitation.  Joanne. Jana Hansen has in the past discussed in detail about benefits and risks of vaginal estrogen. Last mammogram from April 14, 2022 with no mammographic  evidence for malignancy Last bone density August 2021 with osteopenia.  We are not monitoring this since she is not on any antiestrogen therapy. At this time there appears to be no concern for breast cancer recurrence.  She will return to clinic in 1 year or sooner as needed.  Self breast exam recommended monthly. Continue to follow-up with her other  specialists *Total Encounter Time as defined by the Centers for Medicare and Medicaid Services includes, in addition to the face-to-face time of a patient visit (documented in the note above) non-face-to-face time: obtaining and reviewing outside history, ordering and reviewing medications, tests or procedures, care coordination (communications with other health care professionals or caregivers) and documentation in the medical record.

## 2022-05-13 ENCOUNTER — Other Ambulatory Visit: Payer: Self-pay | Admitting: Pain Medicine

## 2022-05-13 DIAGNOSIS — M961 Postlaminectomy syndrome, not elsewhere classified: Secondary | ICD-10-CM

## 2022-05-14 ENCOUNTER — Encounter: Payer: Self-pay | Admitting: Physical Therapy

## 2022-05-14 ENCOUNTER — Ambulatory Visit: Payer: Medicare Other | Admitting: Physical Therapy

## 2022-05-14 DIAGNOSIS — R252 Cramp and spasm: Secondary | ICD-10-CM | POA: Diagnosis not present

## 2022-05-14 DIAGNOSIS — R102 Pelvic and perineal pain: Secondary | ICD-10-CM

## 2022-05-14 DIAGNOSIS — M6281 Muscle weakness (generalized): Secondary | ICD-10-CM

## 2022-05-14 NOTE — Therapy (Signed)
OUTPATIENT PHYSICAL THERAPY TREATMENT NOTE   Patient Name: Joanne Hansen MRN: [id number] DOB:09-02-1945, 76 y.o., female Today's Date: 05/14/2022  PCP: Caren Macadam, MD REFERRING PROVIDER: Jaquita Folds, MD    END OF SESSION:   PT End of Session - 05/14/22 1100     Visit Number 3    Date for PT Re-Evaluation 06/30/22    Authorization Type Medicare    Authorization - Visit Number 3    Authorization - Number of Visits 10    PT Start Time 1100    PT Stop Time 1601    PT Time Calculation (min) 45 min    Activity Tolerance Patient tolerated treatment well    Behavior During Therapy [facility] for tasks assessed/performed             Past Medical History:  Diagnosis Date   Arthritis    Breast cancer ([place])    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   Family history of brain cancer    Family history of breast cancer    Family history of colon cancer    Fecal incontinence    05-16-2019 per pt only a little leakage but controllable (was in clinical trial for [facility] without benefit.)   Frequent headaches    History of syncope    pre-syncope  08/ 2015  per pt no issue with this since    Hyperlipidemia    Malignant neoplasm of upper-inner quadrant of right breast in female, estrogen receptor negative [facility]) oncologist-- dr Jana Hakim  dr moody   dx 08/ 2020--  invasive ductal carcinoma,  05-13-2019  s/p right breast lumpectomy    PAF (paroxysmal atrial fibrillation) ([place]) (05-16-2019   pt from [place], recently moved to [place] to be near daughter, has not established a cardiology yet--  previously seen by dr Lowella Dandy cossu (last office note dated 02-28-2018 scanned in epic)---  echo 04-15-2014  ef 55-60%, G1DD mild AR without stenosis, RVSp 33mHg   Personal history of radiation therapy    stopped 09/22/19    Rectal cyst    Thyroid goiter    pt ultrasound in epic 06-04-2014 , nodules, no bx done   Urinary incontinence, mixed    Past Surgical History:  Procedure  Laterality Date   ABDOMINAL HYSTERECTOMY  1978   w/  RSO  and APPENDECTOMY   BREAST BIOPSY Right 08/2009   benign   BREAST LUMPECTOMY WITH RADIOACTIVE SEED AND SENTINEL LYMPH NODE BIOPSY Right 05/15/2019   Procedure: RIGHT BREAST LUMPECTOMY WITH RADIOACTIVE SEED AND SENTINEL LYMPH NODE MAPPING;  Surgeon: CErroll Luna MD;  Location: [facility]  Service: General;  Laterality: Right;   BREAST SURGERY  2011   biospy of right breast   CATARACT EXTRACTION W/ INTRAOCULAR LENS  IMPLANT, BILATERAL  2004   COLONOSCOPY  11/21/2019   FLEXIBLE SIGMOIDOSCOPY N/A 05/18/2019   Procedure: FLEXIBLE SIGMOIDOSCOPY,  TRANSANAL EXCISION inclusion cyst;  Surgeon: TLeighton Ruff MD;  Location: [facility]  Service: General;  Laterality: N/A;   gluteus medius repair  2017   LUMBAR LAMINECTOMY  2013   L5 -- S1   PORT-A-CATH REMOVAL Right 09/27/2019   Procedure: PORT REMOVAL;  Surgeon: CErroll Luna MD;  [facility]  Service: General;  Laterality: Right;   PORTACATH PLACEMENT Right 05/15/2019   Procedure: INSERTION PORT-A-CATH WITH ULTRASOUND;  Surgeon: CErroll Luna MD;  Location: [facility]  Service: General;  Laterality: Right;   ROTATOR CUFF REPAIR Left 2001  Patient Active Problem List   Diagnosis Date Noted   Palpitations 01/20/2021   Anemia, macrocytic 09/25/2019   Paroxysmal atrial fibrillation ([place]) 08/02/2019   Dizziness 08/02/2019   Lower extremity edema 08/02/2019   Genetic testing 05/16/2019   Malignant neoplasm of upper-inner quadrant of right breast in female, estrogen receptor negative ([place]) 05/07/2019   Family history of breast cancer    Family history of colon cancer    Family history of brain cancer    Hyperlipidemia 11/08/2018   Fecal incontinence 11/08/2018   Urinary, incontinence, stress female 11/08/2018   REFERRING DIAG:  X93.716 (ICD-10-CM) - Levator spasm  R15.9 (ICD-10-CM) - Incontinence of feces,  unspecified fecal incontinence type  N39.3 (ICD-10-CM) - SUI (stress urinary incontinence, female      THERAPY DIAG:  Cramp and spasm   Muscle weakness (generalized)   Pelvic pain   Rationale for Evaluation and Treatment Rehabilitation   ONSET DATE: 2021   SUBJECTIVE:                                                                                                                                                                                            SUBJECTIVE STATEMENT: I am getting a MRI of my back. I stopped the compound due to burning. I leak liquid after a bowel movement. The coccyx pain was better for a little bit after last treatment.      PAIN:  Are you having pain? Yes: NPRS scale: 8/10 Pain location: tailbone pain Pain description: deep  Aggravating factors: walking, sitting and makes it hard to go into standing Relieving factors: nothing   PAIN:  Are you having pain? Yes NPRS scale: 10/10 Pain location: Vaginal lower right of introitus   Pain type: burning Pain description: intermittent    Aggravating factors: penile penetration, vaginal exam Relieving factors: no penile penetration   PRECAUTIONS: Other: breast cancer   WEIGHT BEARING RESTRICTIONS No   FALLS:  Has patient fallen in last 6 months? No   LIVING ENVIRONMENT: Lives with: lives with their spouse     OCCUPATION: retired   PLOF: Independent   PATIENT GOALS ability to have intercourse without pain;    PERTINENT HISTORY:  Abdominal hysterectomy 1978; right breast cancer estrogen receptor negative; lumbar laminectomy   BOWEL MOVEMENT Pain with bowel movement: No Type of bowel movement:Type (Bristol Stool Scale)  type 2, Frequency daily, and Strain Yes Fully empty rectum: Yes: but sometimes has incomplete evacuation.  Leakage: Occurs: after a bowel movement             Consistency with leakage: liquid Pads: Yes: 3-4 Fiber supplement: Yes: citrucel  URINATION Pain with urination:  No Fully empty bladder: Yes: dribbles after urination Stream: Strong Urgency: Yes: if she waits too long Frequency: 2-3 hours Leakage: Coughing, Sneezing, Exercise, and Lifting Pads: Yes: 3-4 liners   INTERCOURSE Pain with intercourse: Initial Penetration due to dryness Ability to have vaginal penetration:  No Climax: no  Marinoff Scale: 3/3 Has tried pelvic physical therapy for about 6 months (at [facility]). Used vaginal dilators and vibrator.   PREGNANCY Vaginal deliveries 1 Tearing No   PROLAPSE Rectocele small and distal       OBJECTIVE:    DIAGNOSTIC FINDINGS:  PVR of 12 ml ; biopsy was negative.    COGNITION:            Overall cognitive status: Within functional limits for tasks assessed                          SENSATION:            Light touch: Appears intact            Proprioception: Appears intact                  POSTURE: No Significant postural limitations               PELVIC ALIGNMENT:   LUMBARAROM/PROMlimited by 50-75% due to pain     LOWER EXTREMITY ROM:   Passive ROM Right eval Left eval  Hip extension -5 -5  Hip external rotation 50 35   (Blank rows = not tested)   LOWER EXTREMITY MMT:   MMT Right eval Left eval  Hip extension 3/5 3/5  Hip abduction 3/5 3/5  Hip internal rotation 4/5 4/5  Hip external rotation 4/5 4/5     PALPATION:      External Perineal Exam dryness notided                             Internal Pelvic Floor stitch noticed at 6 O'Clock, Tenderness located with Qtip inserted into the vagina. Therapist placed her index finger into the vaginal canal 1/4 of an inch but patient had pain so did not go further.    Patient confirms identification and approves PT to assess internal pelvic floor and treatment Yes   PELVIC MMT:   MMT eval  Vaginal 2/5  Internal Anal Sphincter    External Anal Sphincter    Puborectalis    Diastasis Recti    (Blank rows = not tested)        The anal strength not tested due  to her having some bleeding rectally yesterday.  TONE: increased   PROLAPSE: none   TODAY'S TREATMENT  05/14/2022 Manual: Internal pelvic floor techniques:No emotional/communication barriers or cognitive limitation. Patient is motivated to learn. Patient understands and agrees with treatment goals and plan. PT explains patient will be examined in standing, sitting, and lying down to see how their muscles and joints work. When they are ready, they will be asked to remove their underwear so PT can examine their perineum. The patient is also given the option of providing their own chaperone as one is not provided in our facility. The patient also has the right and is explained the right to defer or refuse any part of the evaluation or treatment including the internal exam. With the patient's consent, PT will use one gloved finger to gently assess the muscles of the pelvic  floor, seeing how well it contracts and relaxes and if there is muscle symmetry. After, the patient will get dressed and PT and patient will discuss exam findings and plan of care. PT and patient discuss plan of care, schedule, attendance policy and HEP activities.  Working around the vulvar area on with keeping the finger on the area and move the tissue over the muscle to release the tissue underneath while monitoring for pain.  Fascial release along the vulvar area to improve tissue mobility while monitoring form pain  Self-care: Educated patient on insoluble fiber to reduce the liquid in the stool to reduce stool leakage Educated patient on how to perform her own manual work to the labia and vulvar area to improve tissue mobility.     05/07/2022 Manual: Soft tissue mobilization:to left hip adductor Internal pelvic floor techniques:No emotional/communication barriers or cognitive limitation. Patient is motivated to learn. Patient understands and agrees with treatment goals and plan. PT explains patient will be examined in  standing, sitting, and lying down to see how their muscles and joints work. When they are ready, they will be asked to remove their underwear so PT can examine their perineum. The patient is also given the option of providing their own chaperone as one is not provided in our facility. The patient also has the right and is explained the right to defer or refuse any part of the evaluation or treatment including the internal exam. With the patient's consent, PT will use one gloved finger to gently assess the muscles of the pelvic floor, seeing how well it contracts and relaxes and if there is muscle symmetry. After, the patient will get dressed and PT and patient will discuss exam findings and plan of care. PT and patient discuss plan of care, schedule, attendance policy and HEP activities.  Going through the rectum working on the iliococcygeus, coccygeus, puborectalis, and obturator internist with one finger internally and other externally with patient in left sidely Going through the rectum distracting the coccyx with one finger internally and externally Self-care: Educated patient on the anatomy of the pelvis and muscle and how it connects to the vaginal musculature.     EVAL Date: 04/07/2022 HEP established-see below      PATIENT EDUCATION:  05/14/2022 Education details: gave patient samples of vaginal moisturizer and cream with lidocaine, educated patient on how to manual massage the vulvar area Person educated: Patient Education method: Explanation, Demonstration, and Handouts Education comprehension: verbalized understanding     HOME EXERCISE PROGRAM: See above   ASSESSMENT:   CLINICAL IMPRESSION: Patient is a 76 y.o. female who was seen today for physical therapy evaluation and treatment for SUI, fecal leakage, and levator spasm. Patient was able to tolerate the vulvar area being worked on but at times the pain did increase to 3/10. Patient is having a MRI of her back due to the pain.  Patient will benefit from skilled therapy to improve pain and function.      OBJECTIVE IMPAIRMENTS decreased activity tolerance, decreased coordination, decreased endurance, decreased strength, increased fascial restrictions, increased muscle spasms, impaired tone, and pain.    ACTIVITY LIMITATIONS continence and toileting   PARTICIPATION LIMITATIONS: interpersonal relationship   PERSONAL FACTORS Sex, Time since onset of injury/illness/exacerbation, and 3+ comorbidities:    Abdominal hysterectomy 1978; right breast cancer estrogen receptor negative; lumbar laminectomy are also affecting patient's functional outcome.    REHAB POTENTIAL: Excellent   CLINICAL DECISION MAKING: Evolving/moderate complexity   EVALUATION COMPLEXITY: Moderate  GOALS: Goals reviewed with patient? Yes   SHORT TERM GOALS: Target date: 05/05/2022   Patient is independent with pelvic drop and diaphragmatic breathing to relax her pelvic floor.  Baseline: Goal status: INITIAL   2.  Patient understands how to massage the external perineal area to relax the pelvic floor.  Baseline:  Goal status: INITIAL   3.  Patient able to have the Q-tip inserted into the vaginal canal without pain.  Baseline:  Goal status: INITIAL   4.  Patient is using the vaginal vibrator on the tissue to improve blood flow to the tissue and relax the tissue.  Baseline:  Goal status: INITIAL     LONG TERM GOALS: Target date: 06/30/2022    Patient is independent with using the vaginal dilators and on the largest one with pain </= 1/10.  Baseline:  Goal status: INITIAL   2.  Patient is able to have penile penetration with pain level </= 1/10 and possible with the assistance of the [place].  Baseline:  Goal status: INITIAL   3.  Patient reports her stool leakage has improved >/= 75% due to improved strength and coordination of the pelvic floor.  Baseline:  Goal status: INITIAL   4.  Patient reports she is able to hold her urine  when she has the urge to urinate and walk to the bathroom.  Baseline:  Goal status: INITIAL   5.  Patient reports her urinary leakage with coughing, sneezing, exercise and lifting improved >/= 75% due to the improved mobility of the pelvic floor muscles.  Baseline:  Goal status: INITIAL       PLAN: PT FREQUENCY: 1x/week   PT DURATION: 12 weeks   PLANNED INTERVENTIONS: Therapeutic exercises, Therapeutic activity, Neuromuscular re-education, Patient/Family education, Self Care, Dry Needling, Electrical stimulation, Taping, Biofeedback, and Manual therapy   PLAN FOR NEXT SESSION: manual work externally to the perineal area ,diaphragmatic breathing, vibrator to the tissue  Earlie Counts, PT 05/14/22 11:55 AM

## 2022-05-14 NOTE — Patient Instructions (Signed)
Types of Fiber  There are two main types of fiber:  insoluble and soluble.  Both of these types can prevent and relieve constipation and diarrhea, although some people find one or the other to be more easily digested.  This handout details information about both types of fiber. recommended 25-35 grams of fiber per day,  average 9-12 grams per meal   key is a balance between soluble and insoluble  Insoluble Fiber        Functions of Insoluble Fiber moves bulk through the intestines  controls and balances the pH (acidity) in the intestines   This type of fiber should be avoided or reduced if you have soft, frequent bowel movements or leakage      Benefits of Insoluble Fiber promotes regular bowel movement and prevents constipation  removes fecal waste through colon in less time  keeps an optimal pH in intestines to prevent microbes from producing cancer substances, therefore preventing colon cancer        Food Sources of Insoluble Fiber whole-wheat products  wheat bran "miller's bran" corn bran  flax seed or other seeds vegetables such as green beans, broccoli, cauliflower and potato skins  fruit skins and root vegetable skins  popcorn brown rice [facility] [address] [place], [place] [address] Phone # [phone] Fax [phone]

## 2022-05-19 ENCOUNTER — Encounter: Payer: Self-pay | Admitting: Physical Therapy

## 2022-05-19 ENCOUNTER — Ambulatory Visit: Payer: Medicare Other | Admitting: Physical Therapy

## 2022-05-19 DIAGNOSIS — M6281 Muscle weakness (generalized): Secondary | ICD-10-CM

## 2022-05-19 DIAGNOSIS — R102 Pelvic and perineal pain: Secondary | ICD-10-CM

## 2022-05-19 DIAGNOSIS — R252 Cramp and spasm: Secondary | ICD-10-CM | POA: Diagnosis not present

## 2022-05-19 NOTE — Patient Instructions (Signed)
PROTOCOL FOR VAGINAL DILATORS   Wash dilator with soap and water prior to insertion.    Lay on your back reclined. Knees are to be up and apart while on your bed or in the bathtub with warm water.   Lubricate the end of the dilator with a water-soluble lubricant.  Separate the labia.   Tense the pelvic floor muscles than relax; while relaxing, slide lubricated dilator( round side of dilator) into the vagina.  Insert toward the direction of your spine. Dilator should feel snug and no pain more than 3/10.   Tense muscles again while holding the dilator so it does not get pushed out; relax and slide it in a little further.   Try blowing out as if filling a balloon; this may relax the muscles and allow penetration.  Repeat blowing out to insert dilator further.  Keep dilator in for 10 minutes if tolerate, with the pelvic floor muscles relaxed to further stretch the canal.   Never force the dilator into the canal. Once the dilator is comfortable start to move in and out, side to side, move your hips in different directions  3-4 times per week Progression of dilator.  When you are able to place dilator into vaginal canal and feel no pain or able to move without difficulty you are ready for the next size.  Before you go to the next size start with the original size for 2 minutes then use the next size up for 5 minutes. When the next size up is easy to use, do not have to start with the smaller size.   [facility] [address], [address] [place], [place] [address] Phone # [phone] Fax [phone]  

## 2022-05-19 NOTE — Therapy (Signed)
OUTPATIENT PHYSICAL THERAPY TREATMENT NOTE   Patient Name: Joanne Hansen MRN: [id number] DOB:06/25/1946, 76 y.o., female Today's Date: 05/19/2022  PCP:  Koberlein, Junell C, MD REFERRING PROVIDER: Schroeder, Michelle N, MD  END OF SESSION:   PT End of Session - 05/19/22 1145     Visit Number 4    Date for PT Re-Evaluation 06/30/22    Authorization Type Medicare    Authorization - Visit Number 4    Authorization - Number of Visits 10    PT Start Time 1145    PT Stop Time 1225    PT Time Calculation (min) 40 min    Activity Tolerance Patient tolerated treatment well    Behavior During Therapy WFL for tasks assessed/performed             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   Family history of brain cancer    Family history of breast cancer    Family history of colon cancer    Fecal incontinence    05-16-2019 per pt only a little leakage but controllable (was in clinical trial for Solesta without benefit.)   Frequent headaches    History of syncope    pre-syncope  08/ 2015  per pt no issue with this since    Hyperlipidemia    Malignant neoplasm of upper-inner quadrant of right breast in female, estrogen receptor negative (HCC) oncologist-- dr magrinat/  dr moody   dx 08/ 2020--  invasive ductal carcinoma,  05-13-2019  s/p right breast lumpectomy    PAF (paroxysmal atrial fibrillation) (HCC) (05-16-2019   pt from [place], recently moved to  to be near daughter, has not established a cardiology yet--  previously seen by dr sergio cossu (last office note dated 02-28-2018 scanned in epic)---  echo 04-15-2014  ef 55-60%, G1DD mild AR without stenosis, RVSp 31mmHg   Personal history of radiation therapy    stopped 09/22/19    Rectal cyst    Thyroid goiter    pt ultrasound in epic 06-04-2014 , nodules, no bx done   Urinary incontinence, mixed    Past Surgical History:  Procedure  Laterality Date   ABDOMINAL HYSTERECTOMY  1978   w/  RSO  and APPENDECTOMY   BREAST BIOPSY Right 08/2009   benign   BREAST LUMPECTOMY WITH RADIOACTIVE SEED AND SENTINEL LYMPH NODE BIOPSY Right 05/15/2019   Procedure: RIGHT BREAST LUMPECTOMY WITH RADIOACTIVE SEED AND SENTINEL LYMPH NODE MAPPING;  Surgeon: Cornett, Thomas, MD;  Location: [facility];  Service: General;  Laterality: Right;   BREAST SURGERY  2011   biospy of right breast   CATARACT EXTRACTION W/ INTRAOCULAR LENS  IMPLANT, BILATERAL  2004   COLONOSCOPY  11/21/2019   FLEXIBLE SIGMOIDOSCOPY N/A 05/18/2019   Procedure: FLEXIBLE SIGMOIDOSCOPY,  TRANSANAL EXCISION inclusion cyst;  Surgeon: Thomas, Alicia, MD;  Location: [facility];  Service: General;  Laterality: N/A;   gluteus medius repair  2017   LUMBAR LAMINECTOMY  2013   L5 -- S1   PORT-A-CATH REMOVAL Right 09/27/2019   Procedure: PORT REMOVAL;  Surgeon: Cornett, Thomas, MD;  Location: [facility];  Service: General;  Laterality: Right;   PORTACATH PLACEMENT Right 05/15/2019   Procedure: INSERTION PORT-A-CATH WITH ULTRASOUND;  Surgeon: Cornett, Thomas, MD;  Location: [facility];  Service: General;  Laterality: Right;   ROTATOR CUFF REPAIR Left 2001     Patient Active Problem List   Diagnosis Date Noted   Palpitations 01/20/2021   Anemia, macrocytic 09/25/2019   Paroxysmal atrial fibrillation (HCC) 08/02/2019   Dizziness 08/02/2019   Lower extremity edema 08/02/2019   Genetic testing 05/16/2019   Malignant neoplasm of upper-inner quadrant of right breast in female, estrogen receptor negative (HCC) 05/07/2019   Family history of breast cancer    Family history of colon cancer    Family history of brain cancer    Hyperlipidemia 11/08/2018   Fecal incontinence 11/08/2018   Urinary, incontinence, stress female 11/08/2018   REFERRING DIAG:  M62.838 (ICD-10-CM) - Levator spasm  R15.9 (ICD-10-CM) - Incontinence of feces,  unspecified fecal incontinence type  N39.3 (ICD-10-CM) - SUI (stress urinary incontinence, female      THERAPY DIAG:  Cramp and spasm   Muscle weakness (generalized)   Pelvic pain   Rationale for Evaluation and Treatment Rehabilitation   ONSET DATE: 2021   SUBJECTIVE:                                                                                                                                                                                            SUBJECTIVE STATEMENT: I have an appt. For MRI.     PAIN:  Are you having pain? Yes: NPRS scale: 8/10 Pain location: tailbone pain Pain description: deep  Aggravating factors: walking, sitting and makes it hard to go into standing Relieving factors: nothing   PAIN:  Are you having pain? Yes NPRS scale: 10/10 Pain location: Vaginal lower right of introitus   Pain type: burning Pain description: intermittent    Aggravating factors: penile penetration, vaginal exam Relieving factors: no penile penetration   PRECAUTIONS: Other: breast cancer   WEIGHT BEARING RESTRICTIONS No   FALLS:  Has patient fallen in last 6 months? No   LIVING ENVIRONMENT: Lives with: lives with their spouse     OCCUPATION: retired   PLOF: Independent   PATIENT GOALS ability to have intercourse without pain;    PERTINENT HISTORY:  Abdominal hysterectomy 1978; right breast cancer estrogen receptor negative; lumbar laminectomy   BOWEL MOVEMENT Pain with bowel movement: No Type of bowel movement:Type (Bristol Stool Scale)  type 2, Frequency daily, and Strain Yes Fully empty rectum: Yes: but sometimes has incomplete evacuation.  Leakage: Occurs: after a bowel movement             Consistency with leakage: liquid Pads: Yes: 3-4 Fiber supplement: Yes: citrucel   URINATION Pain with urination: No Fully empty bladder: Yes: dribbles after urination Stream: Strong Urgency: Yes: if she waits too long Frequency: 2-3 hours Leakage: Coughing,    Sneezing, Exercise, and Lifting Pads: Yes: 3-4 liners   INTERCOURSE Pain with intercourse: Initial Penetration due to dryness Ability to have vaginal penetration:  No Climax: no  Marinoff Scale: 3/3 Has tried pelvic physical therapy for about 6 months (at [facility]). Used vaginal dilators and vibrator.   PREGNANCY Vaginal deliveries 1 Tearing No   PROLAPSE Rectocele small and distal       OBJECTIVE:    DIAGNOSTIC FINDINGS:  PVR of 12 ml ; biopsy was negative.    COGNITION:            Overall cognitive status: Within functional limits for tasks assessed                          SENSATION:            Light touch: Appears intact            Proprioception: Appears intact                  POSTURE: No Significant postural limitations               PELVIC ALIGNMENT:   LUMBARAROM/PROMlimited by 50-75% due to pain     LOWER EXTREMITY ROM:   Passive ROM Right eval Left eval  Hip extension -5 -5  Hip external rotation 50 35   (Blank rows = not tested)   LOWER EXTREMITY MMT:   MMT Right eval Left eval  Hip extension 3/5 3/5  Hip abduction 3/5 3/5  Hip internal rotation 4/5 4/5  Hip external rotation 4/5 4/5     PALPATION:      External Perineal Exam dryness notided                             Internal Pelvic Floor stitch noticed at 6 O'Clock, Tenderness located with Qtip inserted into the vagina. Therapist placed her index finger into the vaginal canal 1/4 of an inch but patient had pain so did not go further.    Patient confirms identification and approves PT to assess internal pelvic floor and treatment Yes   PELVIC MMT:   MMT eval 05/19/2022  Vaginal 2/5 3/5 with weak lift  Internal Anal Sphincter     External Anal Sphincter     Puborectalis     Diastasis Recti     (Blank rows = not tested)        The anal strength not tested due to her having some bleeding rectally yesterday.  TONE: increased   PROLAPSE: none   TODAY'S  TREATMENT 05/19/2022 Manual: Internal pelvic floor techniques:No emotional/communication barriers or cognitive limitation. Patient is motivated to learn. Patient understands and agrees with treatment goals and plan. PT explains patient will be examined in standing, sitting, and lying down to see how their muscles and joints work. When they are ready, they will be asked to remove their underwear so PT can examine their perineum. The patient is also given the option of providing their own chaperone as one is not provided in our facility. The patient also has the right and is explained the right to defer or refuse any part of the evaluation or treatment including the internal exam. With the patient's consent, PT will use one gloved finger to gently assess the muscles of the pelvic floor, seeing how well it contracts and relaxes and if there is muscle symmetry. After, the patient will get   dressed and PT and patient will discuss exam findings and plan of care. PT and patient discuss plan of care, schedule, attendance policy and HEP activities.  Using towel fingers working on the introitus to expand the opening slowly while monitoring for pain Going through the vaginal canal working on the vulvar area, along the levator ani and obturator internist while monitoring for pain.  Self-care: Educated patient to use a vibrator on the perineal tissue to work on relaxation and increased blood flow Educated patient to start using the smallest dilator and how to place in the vaginal canal     05/14/2022 Manual: Internal pelvic floor techniques:No emotional/communication barriers or cognitive limitation. Patient is motivated to learn. Patient understands and agrees with treatment goals and plan. PT explains patient will be examined in standing, sitting, and lying down to see how their muscles and joints work. When they are ready, they will be asked to remove their underwear so PT can examine their perineum. The patient is  also given the option of providing their own chaperone as one is not provided in our facility. The patient also has the right and is explained the right to defer or refuse any part of the evaluation or treatment including the internal exam. With the patient's consent, PT will use one gloved finger to gently assess the muscles of the pelvic floor, seeing how well it contracts and relaxes and if there is muscle symmetry. After, the patient will get dressed and PT and patient will discuss exam findings and plan of care. PT and patient discuss plan of care, schedule, attendance policy and HEP activities.  Working around the vulvar area on with keeping the finger on the area and move the tissue over the muscle to release the tissue underneath while monitoring for pain.  Fascial release along the vulvar area to improve tissue mobility while monitoring form pain   Self-care: Educated patient on insoluble fiber to reduce the liquid in the stool to reduce stool leakage Educated patient on how to perform her own manual work to the labia and vulvar area to improve tissue mobility.     05/07/2022 Manual: Soft tissue mobilization:to left hip adductor Internal pelvic floor techniques:No emotional/communication barriers or cognitive limitation. Patient is motivated to learn. Patient understands and agrees with treatment goals and plan. PT explains patient will be examined in standing, sitting, and lying down to see how their muscles and joints work. When they are ready, they will be asked to remove their underwear so PT can examine their perineum. The patient is also given the option of providing their own chaperone as one is not provided in our facility. The patient also has the right and is explained the right to defer or refuse any part of the evaluation or treatment including the internal exam. With the patient's consent, PT will use one gloved finger to gently assess the muscles of the pelvic floor, seeing how well  it contracts and relaxes and if there is muscle symmetry. After, the patient will get dressed and PT and patient will discuss exam findings and plan of care. PT and patient discuss plan of care, schedule, attendance policy and HEP activities.  Going through the rectum working on the iliococcygeus, coccygeus, puborectalis, and obturator internist with one finger internally and other externally with patient in left sidely Going through the rectum distracting the coccyx with one finger internally and externally Self-care: Educated patient on the anatomy of the pelvis and muscle and how it connects to the  vaginal musculature.       PATIENT EDUCATION:  05/14/2022 Education details: gave patient samples of vaginal moisturizer and cream with lidocaine, educated patient on how to manual massage the vulvar area Person educated: Patient Education method: Explanation, Demonstration, and Handouts Education comprehension: verbalized understanding     HOME EXERCISE PROGRAM: See above   ASSESSMENT:   CLINICAL IMPRESSION: Patient is a 76 y.o. female who was seen today for physical therapy evaluation and treatment for SUI, fecal leakage, and levator spasm. Pelvic floor strength increased to 3/5 with a weak lift. Patient was able to tolerate the therapist using 2 index finger into the vaginal canal for first time. She had trigger points in the levator ani and obturator internist. She was able to tolerate the therapist touching the left vulvar area where she has the most irritation. Patient is able to start using the dilators now. Patient will benefit from skilled therapy to improve pain and function.      OBJECTIVE IMPAIRMENTS decreased activity tolerance, decreased coordination, decreased endurance, decreased strength, increased fascial restrictions, increased muscle spasms, impaired tone, and pain.    ACTIVITY LIMITATIONS continence and toileting   PARTICIPATION LIMITATIONS: interpersonal relationship    PERSONAL FACTORS Sex, Time since onset of injury/illness/exacerbation, and 3+ comorbidities:    Abdominal hysterectomy 1978; right breast cancer estrogen receptor negative; lumbar laminectomy are also affecting patient's functional outcome.    REHAB POTENTIAL: Excellent   CLINICAL DECISION MAKING: Evolving/moderate complexity   EVALUATION COMPLEXITY: Moderate     GOALS: Goals reviewed with patient? Yes   SHORT TERM GOALS: Target date: 05/05/2022   Patient is independent with pelvic drop and diaphragmatic breathing to relax her pelvic floor.  Baseline: Goal status: Met 05/19/2022   2.  Patient understands how to massage the external perineal area to relax the pelvic floor.  Baseline:  Goal status: Met 05/19/2022   3.  Patient able to have the Q-tip inserted into the vaginal canal without pain.  Baseline:  Goal status: Met 05/19/2022   4.  Patient is using the vaginal vibrator on the tissue to improve blood flow to the tissue and relax the tissue.  Baseline:  Goal status: INITIAL     LONG TERM GOALS: Target date: 06/30/2022    Patient is independent with using the vaginal dilators and on the largest one with pain </= 1/10.  Baseline:  Goal status: INITIAL   2.  Patient is able to have penile penetration with pain level </= 1/10 and possible with the assistance of the [place].  Baseline:  Goal status: INITIAL   3.  Patient reports her stool leakage has improved >/= 75% due to improved strength and coordination of the pelvic floor.  Baseline:  Goal status: INITIAL   4.  Patient reports she is able to hold her urine when she has the urge to urinate and walk to the bathroom.  Baseline:  Goal status: INITIAL   5.  Patient reports her urinary leakage with coughing, sneezing, exercise and lifting improved >/= 75% due to the improved mobility of the pelvic floor muscles.  Baseline:  Goal status: INITIAL       PLAN: PT FREQUENCY: 1x/week   PT DURATION: 12 weeks   PLANNED  INTERVENTIONS: Therapeutic exercises, Therapeutic activity, Neuromuscular re-education, Patient/Family education, Self Care, Dry Needling, Electrical stimulation, Taping, Biofeedback, and Manual therapy   PLAN FOR NEXT SESSION: manual work to the vagina internally and use hip movements, see how it went with vibrator and dilator  Cheryl Gray, PT 05/19/22 12:32 PM     

## 2022-05-20 ENCOUNTER — Other Ambulatory Visit: Payer: Self-pay | Admitting: Pain Medicine

## 2022-05-20 DIAGNOSIS — M533 Sacrococcygeal disorders, not elsewhere classified: Secondary | ICD-10-CM

## 2022-05-21 ENCOUNTER — Ambulatory Visit (INDEPENDENT_AMBULATORY_CARE_PROVIDER_SITE_OTHER): Payer: Medicare Other | Admitting: Obstetrics and Gynecology

## 2022-05-21 ENCOUNTER — Encounter: Payer: Self-pay | Admitting: Obstetrics and Gynecology

## 2022-05-21 VITALS — BP 133/82 | HR 65

## 2022-05-21 DIAGNOSIS — N9089 Other specified noninflammatory disorders of vulva and perineum: Secondary | ICD-10-CM

## 2022-05-21 DIAGNOSIS — N941 Unspecified dyspareunia: Secondary | ICD-10-CM

## 2022-05-21 DIAGNOSIS — N393 Stress incontinence (female) (male): Secondary | ICD-10-CM | POA: Diagnosis not present

## 2022-05-21 NOTE — Patient Instructions (Addendum)
Constipation: Our goal is to achieve formed bowel movements daily or every-other-day.  You may need to try different combinations of the following options to find what works best for you - everybody's body works differently so feel free to adjust the dosages as needed.  Some options to help maintain bowel health include:  Dietary changes (more leafy greens, vegetables and fruits; less processed foods) Fiber supplementation (Benefiber, FiberCon, Metamucil or Psyllium). Start slow and increase gradually to full dose. Over-the-counter agents such as: stool softeners (Docusate or Colace) and/or laxatives (Miralax, milk of magnesia)  "Power Pudding" is a natural mixture that may help your constipation.  To make blend 1 cup applesauce, 1 cup wheat bran, and 3/4 cup prune juice, refrigerate and then take 1 tablespoon daily with a large glass of water as needed.   Use estrace cream twice a week for vaginal dryness.

## 2022-05-21 NOTE — Progress Notes (Signed)
 Urogynecology Return Visit  SUBJECTIVE  History of Present Illness: Joanne Hansen is a 76 y.o. female seen in follow-up for vaginal irritation, levator spasm and dyspareunia, and SUI.   Last visit prescribed compounded amitriptyline/ gabapentin/ baclofen to be placed on the area of irritation. It caused some vaginal burning so recommended starting the vaginal estrogen cream for two weeks then go back to the compounded cream. She states this helped for a while but then started burning again so she stopped using. The irritation is not too bothersome, only when there's friction in the area.  She has had 3 visits with physical therapy and feels that she is making progress. She is going to be using the dilators 3 times per week.   VULVA, INTROITUS, BIOPSY:  Benign squamous epithelium with slight hyperkeratosis.  Negative for granulomas, dysplasia and malignancy.   For bowel leakage, she was taking metamucil and wants to go back to the citrucel. She is also working on strengthening rectal muscles in PT. Now had harder stools- more Bristol 2-3.  Received records from [facility] from prior urodynamic testing which demonstrated stress urinary incontinence.   Past Medical History: Patient  has a past medical history of Arthritis, Breast cancer ([place]), Family history of brain cancer, Family history of breast cancer, Family history of colon cancer, Fecal incontinence, Frequent headaches, History of syncope, Hyperlipidemia, Malignant neoplasm of upper-inner quadrant of right breast in female, estrogen receptor negative [facility]) (oncologist-- dr Jana Hakim  dr moody), PAF (paroxysmal atrial fibrillation) ([place]) ((05-16-2019), Personal history of radiation therapy, Rectal cyst, Thyroid goiter, and Urinary incontinence, mixed.   Past Surgical History: She  has a past surgical history that includes Rotator cuff repair (Left, 2001); Breast lumpectomy with radioactive seed and sentinel lymph node  biopsy (Right, 05/15/2019); Portacath placement (Right, 05/15/2019); Breast surgery (2011); Lumbar laminectomy (2013); Cataract extraction w/ intraocular lens  implant, bilateral (2004); Abdominal hysterectomy (1978); Flexible sigmoidoscopy (N/A, 05/18/2019); gluteus medius repair (2017); Port-a-cath removal (Right, 09/27/2019); Colonoscopy (11/21/2019); and Breast biopsy (Right, 08/2009).   Medications: She has a current medication list which includes the following prescription(s): calcium-vitamin d, carboxymethylcellulose, cyanocobalamin, estradiol, simvastatin, methylcellulose, and metoprolol tartrate.   Allergies: Patient is allergic to penicillins and sulfa antibiotics.   Social History: Patient  reports that she has never smoked. She has never used smokeless tobacco. She reports current alcohol use. She reports that she does not use drugs.      OBJECTIVE     Physical Exam: Vitals:   05/21/22 1122  BP: 133/82  Pulse: 65   Gen: No apparent distress, A&O x 3.  Detailed Urogynecologic Evaluation:  Deferred.   ASSESSMENT AND PLAN    Ms. Genther is a 76 y.o. with:  1. Vulvar irritation   2. Female dyspareunia   3. SUI (stress urinary incontinence, female)    - Continue with pelvic PT.  - For SUI, discussed options for treatment including pessary, urethral bulking and sling. Handout provided. She is not sure she wants to pursue treatment at this time.  - For constipation, in addition to fiber supplement, can add stool softener.   Follow up as needed  Jaquita Folds, MD  Time spent: I spent 20 minutes dedicated to the care of this patient on the date of this encounter to include pre-visit review of records, face-to-face time with the patient and post visit documentation.

## 2022-05-26 ENCOUNTER — Encounter: Payer: Self-pay | Admitting: Physical Therapy

## 2022-05-26 ENCOUNTER — Ambulatory Visit: Payer: Medicare Other | Attending: Obstetrics and Gynecology | Admitting: Physical Therapy

## 2022-05-26 DIAGNOSIS — M6281 Muscle weakness (generalized): Secondary | ICD-10-CM | POA: Insufficient documentation

## 2022-05-26 DIAGNOSIS — R252 Cramp and spasm: Secondary | ICD-10-CM | POA: Diagnosis not present

## 2022-05-26 DIAGNOSIS — R102 Pelvic and perineal pain: Secondary | ICD-10-CM | POA: Diagnosis present

## 2022-05-26 NOTE — Therapy (Signed)
OUTPATIENT PHYSICAL THERAPY TREATMENT NOTE   Patient Name: Joanne Hansen MRN: [id number] DOB:May 04, 1946, 76 y.o., female Today's Date: 05/26/2022  PCP: Caren Macadam, MD REFERRING PROVIDER: Jaquita Folds, MD  END OF SESSION:   PT End of Session - 05/26/22 1233     Visit Number 5    Date for PT Re-Evaluation 06/30/22    Authorization Type Medicare    Authorization - Visit Number 5    Authorization - Number of Visits 10    PT Start Time 5456    PT Stop Time 1310    PT Time Calculation (min) 40 min    Activity Tolerance Patient tolerated treatment well    Behavior During Therapy [facility] for tasks assessed/performed             Past Medical History:  Diagnosis Date   Arthritis    Breast cancer ([place])    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   Family history of brain cancer    Family history of breast cancer    Family history of colon cancer    Fecal incontinence    05-16-2019 per pt only a little leakage but controllable (was in clinical trial for [facility] without benefit.)   Frequent headaches    History of syncope    pre-syncope  08/ 2015  per pt no issue with this since    Hyperlipidemia    Malignant neoplasm of upper-inner quadrant of right breast in female, estrogen receptor negative [facility]) oncologist-- dr Jana Hakim  dr moody   dx 08/ 2020--  invasive ductal carcinoma,  05-13-2019  s/p right breast lumpectomy    PAF (paroxysmal atrial fibrillation) ([place]) (05-16-2019   pt from [place], recently moved to [place] to be near daughter, has not established a cardiology yet--  previously seen by dr Lowella Dandy cossu (last office note dated 02-28-2018 scanned in epic)---  echo 04-15-2014  ef 55-60%, G1DD mild AR without stenosis, RVSp 36mHg   Personal history of radiation therapy    stopped 09/22/19    Rectal cyst    Thyroid goiter    pt ultrasound in epic 06-04-2014 , nodules, no bx done   Urinary incontinence, mixed    Past Surgical History:  Procedure  Laterality Date   ABDOMINAL HYSTERECTOMY  1978   w/  RSO  and APPENDECTOMY   BREAST BIOPSY Right 08/2009   benign   BREAST LUMPECTOMY WITH RADIOACTIVE SEED AND SENTINEL LYMPH NODE BIOPSY Right 05/15/2019   Procedure: RIGHT BREAST LUMPECTOMY WITH RADIOACTIVE SEED AND SENTINEL [place]  Surgeon: CErroll Luna MD;  [place]  Service: General;  Laterality: Right;   BREAST SURGERY  2011   biospy of right breast   CATARACT EXTRACTION W/ INTRAOCULAR LENS  IMPLANT, BILATERAL  2004   COLONOSCOPY  11/21/2019   FLEXIBLE SIGMOIDOSCOPY N/A 05/18/2019   Procedure: FLEXIBLE SIGMOIDOSCOPY,  TRANSANAL EXCISION inclusion cyst;  Surgeon: TLeighton Ruff MD;  [place]  Service: General;  Laterality: N/A;   gluteus medius repair  2017   LUMBAR LAMINECTOMY  2013   L5 -- S1   PORT-A-CATH REMOVAL Right 09/27/2019   Procedure: PORT REMOVAL;  Surgeon: CErroll Luna MD;  [place]  Service: General;  Laterality: Right;   PORTACATH PLACEMENT Right 05/15/2019   Procedure: INSERTION PORT-A-CATH WITH ULTRASOUND;  Surgeon: CErroll Luna MD;  [place]  Service: General;  Laterality: Right;   ROTATOR CUFF REPAIR Left 2001  Patient Active Problem List   Diagnosis Date Noted   Palpitations 01/20/2021   Anemia, macrocytic 09/25/2019   Paroxysmal atrial fibrillation ([place]) 08/02/2019   Dizziness 08/02/2019   Lower extremity edema 08/02/2019   Genetic testing 05/16/2019   Malignant neoplasm of upper-inner quadrant of right breast in female, estrogen receptor negative ([place]) 05/07/2019   Family history of breast cancer    Family history of colon cancer    Family history of brain cancer    Hyperlipidemia 11/08/2018   Fecal incontinence 11/08/2018   Urinary, incontinence, stress female 11/08/2018   REFERRING DIAG:  O27.741 (ICD-10-CM) - Levator spasm  R15.9 (ICD-10-CM) - Incontinence of feces,  unspecified fecal incontinence type  N39.3 (ICD-10-CM) - SUI (stress urinary incontinence, female      THERAPY DIAG:  Cramp and spasm   Muscle weakness (generalized)   Pelvic pain   Rationale for Evaluation and Treatment Rehabilitation   ONSET DATE: 2021   SUBJECTIVE:                                                                                                                                                                                            SUBJECTIVE STATEMENT: The doctor released me. I feel it will help my bowels. I still have the liquid leakage from the bowels. I have a little leakage. Using the smallest dilator. Marland Kitchen     PAIN:  Are you having pain? Yes: NPRS scale: 8/10 Pain location: tailbone pain Pain description: deep  Aggravating factors: walking, sitting and makes it hard to go into standing Relieving factors: nothing   PAIN:  Are you having pain? Yes NPRS scale: 10/10 Pain location: Vaginal lower right of introitus   Pain type: burning Pain description: intermittent    Aggravating factors: penile penetration, vaginal exam Relieving factors: no penile penetration   PRECAUTIONS: Other: breast cancer   WEIGHT BEARING RESTRICTIONS No   FALLS:  Has patient fallen in last 6 months? No   LIVING ENVIRONMENT: Lives with: lives with their spouse     OCCUPATION: retired   PLOF: Independent   PATIENT GOALS ability to have intercourse without pain;    PERTINENT HISTORY:  Abdominal hysterectomy 1978; right breast cancer estrogen receptor negative; lumbar laminectomy   BOWEL MOVEMENT Pain with bowel movement: No Type of bowel movement:Type (Bristol Stool Scale)  type 2, Frequency daily, and Strain Yes Fully empty rectum: Yes: but sometimes has incomplete evacuation.  Leakage: Occurs: after a bowel movement             Consistency with leakage: liquid Pads: Yes: 3-4 Fiber supplement: Yes: citrucel   URINATION Pain with  urination: No Fully empty  bladder: Yes: dribbles after urination Stream: Strong Urgency: Yes: if she waits too long Frequency: 2-3 hours Leakage: Coughing, Sneezing, Exercise, and Lifting Pads: Yes: 3-4 liners   INTERCOURSE Pain with intercourse: Initial Penetration due to dryness Ability to have vaginal penetration:  No Climax: no  Marinoff Scale: 3/3 Has tried pelvic physical therapy for about 6 months (at [facility]). Used vaginal dilators and vibrator.   PREGNANCY Vaginal deliveries 1 Tearing No   PROLAPSE Rectocele small and distal       OBJECTIVE:    DIAGNOSTIC FINDINGS:  PVR of 12 ml ; biopsy was negative.    COGNITION:            Overall cognitive status: Within functional limits for tasks assessed                          SENSATION:            Light touch: Appears intact            Proprioception: Appears intact                  POSTURE: No Significant postural limitations               PELVIC ALIGNMENT:   LUMBARAROM/PROMlimited by 50-75% due to pain     LOWER EXTREMITY ROM:   Passive ROM Right eval Left eval  Hip extension -5 -5  Hip external rotation 50 35   (Blank rows = not tested)   LOWER EXTREMITY MMT:   MMT Right eval Left eval  Hip extension 3/5 3/5  Hip abduction 3/5 3/5  Hip internal rotation 4/5 4/5  Hip external rotation 4/5 4/5     PALPATION:      External Perineal Exam dryness notided                             Internal Pelvic Floor stitch noticed at 6 O'Clock, Tenderness located with Qtip inserted into the vagina. Therapist placed her index finger into the vaginal canal 1/4 of an inch but patient had pain so did not go further.    Patient confirms identification and approves PT to assess internal pelvic floor and treatment Yes   PELVIC MMT:   MMT eval 05/19/2022 05/26/2022  Vaginal 2/5 3/5 with weak lift 4/5 but takes time to relax  Internal Anal Sphincter       External Anal Sphincter       Puborectalis       Diastasis Recti        (Blank rows = not tested)        The anal strength not tested due to her having some bleeding rectally yesterday.  TONE: increased   PROLAPSE: none   TODAY'S TREATMENT 05/26/2022 Manual: Internal pelvic floor techniques:No emotional/communication barriers or cognitive limitation. Patient is motivated to learn. Patient understands and agrees with treatment goals and plan. PT explains patient will be examined in standing, sitting, and lying down to see how their muscles and joints work. When they are ready, they will be asked to remove their underwear so PT can examine their perineum. The patient is also given the option of providing their own chaperone as one is not provided in our facility. The patient also has the right and is explained the right to defer or refuse any part of the evaluation or treatment including the  internal exam. With the patient's consent, PT will use one gloved finger to gently assess the muscles of the pelvic floor, seeing how well it contracts and relaxes and if there is muscle symmetry. After, the patient will get dressed and PT and patient will discuss exam findings and plan of care. PT and patient discuss plan of care, schedule, attendance policy and HEP activities.  Going through the vaginal canal working on the introitus, along the labia and perineal area going slowly and monitoring for pain using [employer] Reveleum Self-care: Educated patient on using the vibrator around the clitoris to work on the stimulation and blood flow    05/19/2022 Manual: Internal pelvic floor techniques:No emotional/communication barriers or cognitive limitation. Patient is motivated to learn. Patient understands and agrees with treatment goals and plan. PT explains patient will be examined in standing, sitting, and lying down to see how their muscles and joints work. When they are ready, they will be asked to remove their underwear so PT can examine their perineum. The patient is also  given the option of providing their own chaperone as one is not provided in our facility. The patient also has the right and is explained the right to defer or refuse any part of the evaluation or treatment including the internal exam. With the patient's consent, PT will use one gloved finger to gently assess the muscles of the pelvic floor, seeing how well it contracts and relaxes and if there is muscle symmetry. After, the patient will get dressed and PT and patient will discuss exam findings and plan of care. PT and patient discuss plan of care, schedule, attendance policy and HEP activities.  Using towel fingers working on the introitus to expand the opening slowly while monitoring for pain Going through the vaginal canal working on the vulvar area, along the levator ani and obturator internist while monitoring for pain.  Self-care: Educated patient to use a vibrator on the perineal tissue to work on relaxation and increased blood flow Educated patient to start using the smallest dilator and how to place in the vaginal canal     05/14/2022 Manual: Internal pelvic floor techniques:No emotional/communication barriers or cognitive limitation. Patient is motivated to learn. Patient understands and agrees with treatment goals and plan. PT explains patient will be examined in standing, sitting, and lying down to see how their muscles and joints work. When they are ready, they will be asked to remove their underwear so PT can examine their perineum. The patient is also given the option of providing their own chaperone as one is not provided in our facility. The patient also has the right and is explained the right to defer or refuse any part of the evaluation or treatment including the internal exam. With the patient's consent, PT will use one gloved finger to gently assess the muscles of the pelvic floor, seeing how well it contracts and relaxes and if there is muscle symmetry. After, the patient will get  dressed and PT and patient will discuss exam findings and plan of care. PT and patient discuss plan of care, schedule, attendance policy and HEP activities.  Working around the vulvar area on with keeping the finger on the area and move the tissue over the muscle to release the tissue underneath while monitoring for pain.  Fascial release along the vulvar area to improve tissue mobility while monitoring form pain   Self-care: Educated patient on insoluble fiber to reduce the liquid in the stool to reduce stool leakage  Educated patient on how to perform her own manual work to the labia and vulvar area to improve tissue mobility.          PATIENT EDUCATION:  05/14/2022 Education details: gave patient samples of vaginal moisturizer and cream with lidocaine, educated patient on how to manual massage the vulvar area Person educated: Patient Education method: Explanation, Demonstration, and Handouts Education comprehension: verbalized understanding     HOME EXERCISE PROGRAM: See above   ASSESSMENT:   CLINICAL IMPRESSION: Patient is a 76 y.o. female who was seen today for physical therapy evaluation and treatment for SUI, fecal leakage, and levator spasm. Pelvic floor strength increased to 4/5 . Patient is using the smallest dilator without difficulty. She is now ready for the next size dilator.  She was able to tolerate the therapist touching the left vulvar area where she has the most irritation. Patient is able to start using the dilators now. Patient will benefit from skilled therapy to improve pain and function.      OBJECTIVE IMPAIRMENTS decreased activity tolerance, decreased coordination, decreased endurance, decreased strength, increased fascial restrictions, increased muscle spasms, impaired tone, and pain.    ACTIVITY LIMITATIONS continence and toileting   PARTICIPATION LIMITATIONS: interpersonal relationship   PERSONAL FACTORS Sex, Time since onset of injury/illness/exacerbation,  and 3+ comorbidities:    Abdominal hysterectomy 1978; right breast cancer estrogen receptor negative; lumbar laminectomy are also affecting patient's functional outcome.    REHAB POTENTIAL: Excellent   CLINICAL DECISION MAKING: Evolving/moderate complexity   EVALUATION COMPLEXITY: Moderate     GOALS: Goals reviewed with patient? Yes   SHORT TERM GOALS: Target date: 05/05/2022   Patient is independent with pelvic drop and diaphragmatic breathing to relax her pelvic floor.  Baseline: Goal status: Met 05/19/2022   2.  Patient understands how to massage the external perineal area to relax the pelvic floor.  Baseline:  Goal status: Met 05/19/2022   3.  Patient able to have the Q-tip inserted into the vaginal canal without pain.  Baseline:  Goal status: Met 05/19/2022   4.  Patient is using the vaginal vibrator on the tissue to improve blood flow to the tissue and relax the tissue.  Baseline:  Goal status: Met 05/26/2022     LONG TERM GOALS: Target date: 06/30/2022    Patient is independent with using the vaginal dilators and on the largest one with pain </= 1/10.  Baseline:  Goal status: INITIAL   2.  Patient is able to have penile penetration with pain level </= 1/10 and possible with the assistance of the [place].  Baseline:  Goal status: INITIAL   3.  Patient reports her stool leakage has improved >/= 75% due to improved strength and coordination of the pelvic floor.  Baseline:  Goal status: INITIAL   4.  Patient reports she is able to hold her urine when she has the urge to urinate and walk to the bathroom.  Baseline:  Goal status: INITIAL   5.  Patient reports her urinary leakage with coughing, sneezing, exercise and lifting improved >/= 75% due to the improved mobility of the pelvic floor muscles.  Baseline:  Goal status: INITIAL       PLAN: PT FREQUENCY: 1x/week   PT DURATION: 12 weeks   PLANNED INTERVENTIONS: Therapeutic exercises, Therapeutic activity,  Neuromuscular re-education, Patient/Family education, Self Care, Dry Needling, Electrical stimulation, Taping, Biofeedback, and Manual therapy   PLAN FOR NEXT SESSION: manual work to the vagina internally and use hip movements,usr the [place]  harvest reveleum.  Earlie Counts, PT 05/26/22 1:13 PM

## 2022-05-30 ENCOUNTER — Ambulatory Visit
Admission: RE | Admit: 2022-05-30 | Discharge: 2022-05-30 | Disposition: A | Discharge status: Home / Self Care | Payer: Medicare Other | Source: Ambulatory Visit | Attending: Pain Medicine | Admitting: Pain Medicine

## 2022-05-30 DIAGNOSIS — M961 Postlaminectomy syndrome, not elsewhere classified: Secondary | ICD-10-CM

## 2022-05-30 MED ORDER — GADOBENATE DIMEGLUMINE 529 MG/ML IV SOLN
5.0000 mL | Freq: Once | INTRAVENOUS | Status: AC | PRN
  Administered 2022-05-30: 5 mL via INTRAVENOUS

## 2022-06-02 ENCOUNTER — Encounter: Payer: Self-pay | Admitting: Physical Therapy

## 2022-06-02 ENCOUNTER — Ambulatory Visit: Payer: Medicare Other | Admitting: Physical Therapy

## 2022-06-02 DIAGNOSIS — R252 Cramp and spasm: Secondary | ICD-10-CM

## 2022-06-02 DIAGNOSIS — M6281 Muscle weakness (generalized): Secondary | ICD-10-CM

## 2022-06-02 DIAGNOSIS — R102 Pelvic and perineal pain: Secondary | ICD-10-CM

## 2022-06-02 NOTE — Therapy (Signed)
OUTPATIENT PHYSICAL THERAPY TREATMENT NOTE   Patient Name: Joanne Hansen MRN: [id number] DOB:1946-04-11, 76 y.o., female Today's Date: 06/02/2022  PCP: Caren Macadam, MD REFERRING PROVIDER: Jaquita Folds, MD  END OF SESSION:   PT End of Session - 06/02/22 1231     Visit Number 6    Date for PT Re-Evaluation 06/30/22    Authorization Type Medicare    Authorization - Visit Number 6    Authorization - Number of Visits 10    PT Start Time 5366    PT Stop Time 4403    PT Time Calculation (min) 45 min    Activity Tolerance Patient tolerated treatment well    Behavior During Therapy [facility] for tasks assessed/performed             Past Medical History:  Diagnosis Date   Arthritis    Breast cancer ([place])    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   Family history of brain cancer    Family history of breast cancer    Family history of colon cancer    Fecal incontinence    05-16-2019 per pt only a little leakage but controllable (was in clinical trial for [facility] without benefit.)   Frequent headaches    History of syncope    pre-syncope  08/ 2015  per pt no issue with this since    Hyperlipidemia    Malignant neoplasm of upper-inner quadrant of right breast in female, estrogen receptor negative [facility]) oncologist-- dr Jana Hakim  dr moody   dx 08/ 2020--  invasive ductal carcinoma,  05-13-2019  s/p right breast lumpectomy    PAF (paroxysmal atrial fibrillation) ([place]) (05-16-2019   pt from [place], recently moved to [place] to be near daughter, has not established a cardiology yet--  previously seen by dr Lowella Dandy cossu (last office note dated 02-28-2018 scanned in epic)---  echo 04-15-2014  ef 55-60%, G1DD mild AR without stenosis, RVSp 40mHg   Personal history of radiation therapy    stopped 09/22/19    Rectal cyst    Thyroid goiter    pt ultrasound in epic 06-04-2014 , nodules, no bx done   Urinary incontinence, mixed    Past Surgical History:  Procedure  Laterality Date   ABDOMINAL HYSTERECTOMY  1978   w/  RSO  and APPENDECTOMY   BREAST BIOPSY Right 08/2009   benign   BREAST LUMPECTOMY WITH RADIOACTIVE SEED AND SENTINEL LYMPH NODE BIOPSY Right 05/15/2019   Procedure: RIGHT BREAST LUMPECTOMY WITH RADIOACTIVE SEED AND SENTINEL LYMPH NODE MAPPING;  Surgeon: CErroll Luna MD;  [place] [facility]  Service: General;  Laterality: Right;   BREAST SURGERY  2011   biospy of right breast   CATARACT EXTRACTION W/ INTRAOCULAR LENS  IMPLANT, BILATERAL  2004   COLONOSCOPY  11/21/2019   FLEXIBLE SIGMOIDOSCOPY N/A 05/18/2019   Procedure: FLEXIBLE SIGMOIDOSCOPY,  TRANSANAL EXCISION inclusion cyst;  Surgeon: TLeighton Ruff MD;  [place] [facility]  Service: General;  Laterality: N/A;   gluteus medius repair  2017   LUMBAR LAMINECTOMY  2013   L5 -- S1   PORT-A-CATH REMOVAL Right 09/27/2019   Procedure: PORT REMOVAL;  Surgeon: CErroll Luna MD;  [facility]  Service: General;  Laterality: Right;   PORTACATH PLACEMENT Right 05/15/2019   Procedure: INSERTION PORT-A-CATH WITH ULTRASOUND;  Surgeon: CErroll Luna MD;  [place] [facility]  Service: General;  Laterality: Right;   ROTATOR CUFF REPAIR Left 2001  Patient Active Problem List   Diagnosis Date Noted   Palpitations 01/20/2021   Anemia, macrocytic 09/25/2019   Paroxysmal atrial fibrillation ([place]) 08/02/2019   Dizziness 08/02/2019   Lower extremity edema 08/02/2019   Genetic testing 05/16/2019   Malignant neoplasm of upper-inner quadrant of right breast in female, estrogen receptor negative ([place]) 05/07/2019   Family history of breast cancer    Family history of colon cancer    Family history of brain cancer    Hyperlipidemia 11/08/2018   Fecal incontinence 11/08/2018   Urinary, incontinence, stress female 11/08/2018   REFERRING DIAG:  Q76.195 (ICD-10-CM) - Levator spasm  R15.9 (ICD-10-CM) - Incontinence of feces,  unspecified fecal incontinence type  N39.3 (ICD-10-CM) - SUI (stress urinary incontinence, female      THERAPY DIAG:  Cramp and spasm   Muscle weakness (generalized)   Pelvic pain   Rationale for Evaluation and Treatment Rehabilitation   ONSET DATE: 2021   SUBJECTIVE:                                                                                                                                                                                            SUBJECTIVE STATEMENT: Monday I went to the next size but it is a little uncomfortable. I had the MRI of my back then the coccyx on Saturday. My tailbone has become progressively worse. I feel it when I am walking.     PAIN:  Are you having pain? Yes: NPRS scale: 2-8/10 Pain location: tailbone pain Pain description: deep  Aggravating factors: walking, sitting and makes it hard to go into standing Relieving factors: nothing   PAIN:  Are you having pain? Yes NPRS scale: 10/10, using the bigger dilator is 3/10 Pain location: Vaginal lower right of introitus   Pain type: burning Pain description: intermittent    Aggravating factors: penile penetration, vaginal exam Relieving factors: no penile penetration   PRECAUTIONS: Other: breast cancer   WEIGHT BEARING RESTRICTIONS No   FALLS:  Has patient fallen in last 6 months? No   LIVING ENVIRONMENT: Lives with: lives with their spouse     OCCUPATION: retired   PLOF: Independent   PATIENT GOALS ability to have intercourse without pain;    PERTINENT HISTORY:  Abdominal hysterectomy 1978; right breast cancer estrogen receptor negative; lumbar laminectomy   BOWEL MOVEMENT Pain with bowel movement: No Type of bowel movement:Type (Bristol Stool Scale)  type 2, Frequency daily, and Strain Yes Fully empty rectum: Yes: but sometimes has incomplete evacuation.  Leakage: Occurs: after a bowel movement             Consistency with  leakage: liquid Pads: Yes: 3-4 Fiber  supplement: Yes: citrucel   URINATION Pain with urination: No Fully empty bladder: Yes: dribbles after urination Stream: Strong Urgency: Yes: if she waits too long Frequency: 2-3 hours Leakage: Coughing, Sneezing, Exercise, and Lifting Pads: Yes: 3-4 liners   INTERCOURSE Pain with intercourse: Initial Penetration due to dryness Ability to have vaginal penetration:  No Climax: no  Marinoff Scale: 3/3 Has tried pelvic physical therapy for about 6 months (at [facility]). Used vaginal dilators and vibrator.   PREGNANCY Vaginal deliveries 1 Tearing No   PROLAPSE Rectocele small and distal       OBJECTIVE:    DIAGNOSTIC FINDINGS:  PVR of 12 ml ; biopsy was negative.    COGNITION:            Overall cognitive status: Within functional limits for tasks assessed                          SENSATION:            Light touch: Appears intact            Proprioception: Appears intact                  POSTURE: No Significant postural limitations               PELVIC ALIGNMENT:   LUMBARAROM/PROMlimited by 50-75% due to pain     LOWER EXTREMITY ROM:   Passive ROM Right eval Left eval  Hip extension -5 -5  Hip external rotation 50 35   (Blank rows = not tested)   LOWER EXTREMITY MMT:   MMT Right eval Left eval  Hip extension 3/5 3/5  Hip abduction 3/5 3/5  Hip internal rotation 4/5 4/5  Hip external rotation 4/5 4/5     PALPATION:      External Perineal Exam dryness notided                             Internal Pelvic Floor stitch noticed at 6 O'Clock, Tenderness located with Qtip inserted into the vagina. Therapist placed her index finger into the vaginal canal 1/4 of an inch but patient had pain so did not go further.    Patient confirms identification and approves PT to assess internal pelvic floor and treatment Yes   PELVIC MMT:   MMT eval 05/19/2022 05/26/2022  Vaginal 2/5 3/5 with weak lift 4/5 but takes time to relax  Internal Anal Sphincter         External Anal Sphincter        Puborectalis        Diastasis Recti        (Blank rows = not tested)        The anal strength not tested due to her having some bleeding rectally yesterday.  TONE: increased   PROLAPSE: none   TODAY'S TREATMENT 06/02/2022 Manual: Myofascial release:fascial release along the labias on the outside and inner surface with [facility] reveleum Internal pelvic floor techniques:No emotional/communication barriers or cognitive limitation. Patient is motivated to learn. Patient understands and agrees with treatment goals and plan. PT explains patient will be examined in standing, sitting, and lying down to see how their muscles and joints work. When they are ready, they will be asked to remove their underwear so PT can examine their perineum. The patient is also given the option of providing their  own chaperone as one is not provided in our facility. The patient also has the right and is explained the right to defer or refuse any part of the evaluation or treatment including the internal exam. With the patient's consent, PT will use one gloved finger to gently assess the muscles of the pelvic floor, seeing how well it contracts and relaxes and if there is muscle symmetry. After, the patient will get dressed and PT and patient will discuss exam findings and plan of care. PT and patient discuss plan of care, schedule, attendance policy and HEP activities.    Going through the vaginal canal with 1 finger then with 2 fingers gently opening the vaginal canal    Then bringing the index finger deeper into the vaginal canal working on the levator ani and obturator internist, then worked on the left aycocks canal.  Neuromuscular re-education: Form correction: walking with bigger step lengths and increased arm swing to reduce the tightness in the pelvic floor and coccyx pain Going up and down steps with alternate motion working the quads to work on control going  down Exercises: Stretches/mobility:hip flexor stretch in standing on step and then in sitting to protect her back Gastroc stretch in standing    05/26/2022 Manual: Internal pelvic floor techniques:No emotional/communication barriers or cognitive limitation. Patient is motivated to learn. Patient understands and agrees with treatment goals and plan. PT explains patient will be examined in standing, sitting, and lying down to see how their muscles and joints work. When they are ready, they will be asked to remove their underwear so PT can examine their perineum. The patient is also given the option of providing their own chaperone as one is not provided in our facility. The patient also has the right and is explained the right to defer or refuse any part of the evaluation or treatment including the internal exam. With the patient's consent, PT will use one gloved finger to gently assess the muscles of the pelvic floor, seeing how well it contracts and relaxes and if there is muscle symmetry. After, the patient will get dressed and PT and patient will discuss exam findings and plan of care. PT and patient discuss plan of care, schedule, attendance policy and HEP activities.  Going through the vaginal canal working on the introitus, along the labia and perineal area going slowly and monitoring for pain using [employer] Reveleum Self-care: Educated patient on using the vibrator around the clitoris to work on the stimulation and blood flow    05/19/2022 Manual: Internal pelvic floor techniques:No emotional/communication barriers or cognitive limitation. Patient is motivated to learn. Patient understands and agrees with treatment goals and plan. PT explains patient will be examined in standing, sitting, and lying down to see how their muscles and joints work. When they are ready, they will be asked to remove their underwear so PT can examine their perineum. The patient is also given the option of providing  their own chaperone as one is not provided in our facility. The patient also has the right and is explained the right to defer or refuse any part of the evaluation or treatment including the internal exam. With the patient's consent, PT will use one gloved finger to gently assess the muscles of the pelvic floor, seeing how well it contracts and relaxes and if there is muscle symmetry. After, the patient will get dressed and PT and patient will discuss exam findings and plan of care. PT and patient discuss plan of care, schedule,  attendance policy and HEP activities.  Using towel fingers working on the introitus to expand the opening slowly while monitoring for pain Going through the vaginal canal working on the vulvar area, along the levator ani and obturator internist while monitoring for pain.  Self-care: Educated patient to use a vibrator on the perineal tissue to work on relaxation and increased blood flow Educated patient to start using the smallest dilator and how to place in the vaginal canal     05/14/2022 Manual: Internal pelvic floor techniques:No emotional/communication barriers or cognitive limitation. Patient is motivated to learn. Patient understands and agrees with treatment goals and plan. PT explains patient will be examined in standing, sitting, and lying down to see how their muscles and joints work. When they are ready, they will be asked to remove their underwear so PT can examine their perineum. The patient is also given the option of providing their own chaperone as one is not provided in our facility. The patient also has the right and is explained the right to defer or refuse any part of the evaluation or treatment including the internal exam. With the patient's consent, PT will use one gloved finger to gently assess the muscles of the pelvic floor, seeing how well it contracts and relaxes and if there is muscle symmetry. After, the patient will get dressed and PT and patient will  discuss exam findings and plan of care. PT and patient discuss plan of care, schedule, attendance policy and HEP activities.  Working around the vulvar area on with keeping the finger on the area and move the tissue over the muscle to release the tissue underneath while monitoring for pain.  Fascial release along the vulvar area to improve tissue mobility while monitoring form pain   Self-care: Educated patient on insoluble fiber to reduce the liquid in the stool to reduce stool leakage Educated patient on how to perform her own manual work to the labia and vulvar area to improve tissue mobility.          PATIENT EDUCATION:  05/14/2022 Education details: gave patient samples of vaginal moisturizer and cream with lidocaine, educated patient on how to manual massage the vulvar area Person educated: Patient Education method: Explanation, Demonstration, and Handouts Education comprehension: verbalized understanding     HOME EXERCISE PROGRAM: See above   ASSESSMENT:   CLINICAL IMPRESSION: Patient is a 76 y.o. female who was seen today for physical therapy evaluation and treatment for SUI, fecal leakage, and levator spasm.  Patient has used the second size dilator with discomfort at level 3/10. Patient was able to  tolerate the therapist using 2 fingers into the vaginal canal. Patient was able to tolerate the therapist placing her index finger into the vaginal canal 4-5 inches for the first time. She walks with reduced step lengths and trouble going down steps so worked on increasing hip extension.  Patient will benefit from skilled therapy to improve pain and function.      OBJECTIVE IMPAIRMENTS decreased activity tolerance, decreased coordination, decreased endurance, decreased strength, increased fascial restrictions, increased muscle spasms, impaired tone, and pain.    ACTIVITY LIMITATIONS continence and toileting   PARTICIPATION LIMITATIONS: interpersonal relationship   PERSONAL  FACTORS Sex, Time since onset of injury/illness/exacerbation, and 3+ comorbidities:    Abdominal hysterectomy 1978; right breast cancer estrogen receptor negative; lumbar laminectomy are also affecting patient's functional outcome.    REHAB POTENTIAL: Excellent   CLINICAL DECISION MAKING: Evolving/moderate complexity   EVALUATION COMPLEXITY: Moderate  GOALS: Goals reviewed with patient? Yes   SHORT TERM GOALS: Target date: 05/05/2022   Patient is independent with pelvic drop and diaphragmatic breathing to relax her pelvic floor.  Baseline: Goal status: Met 05/19/2022   2.  Patient understands how to massage the external perineal area to relax the pelvic floor.  Baseline:  Goal status: Met 05/19/2022   3.  Patient able to have the Q-tip inserted into the vaginal canal without pain.  Baseline:  Goal status: Met 05/19/2022   4.  Patient is using the vaginal vibrator on the tissue to improve blood flow to the tissue and relax the tissue.  Baseline:  Goal status: Met 05/26/2022     LONG TERM GOALS: Target date: 06/30/2022    Patient is independent with using the vaginal dilators and on the largest one with pain </= 1/10.  Baseline:  Goal status: INITIAL   2.  Patient is able to have penile penetration with pain level </= 1/10 and possible with the assistance of the [place].  Baseline:  Goal status: INITIAL   3.  Patient reports her stool leakage has improved >/= 75% due to improved strength and coordination of the pelvic floor.  Baseline:  Goal status: INITIAL   4.  Patient reports she is able to hold her urine when she has the urge to urinate and walk to the bathroom.  Baseline:  Goal status: INITIAL   5.  Patient reports her urinary leakage with coughing, sneezing, exercise and lifting improved >/= 75% due to the improved mobility of the pelvic floor muscles.  Baseline:  Goal status: INITIAL       PLAN: PT FREQUENCY: 1x/week   PT DURATION: 12 weeks   PLANNED  INTERVENTIONS: Therapeutic exercises, Therapeutic activity, Neuromuscular re-education, Patient/Family education, Self Care, Dry Needling, Electrical stimulation, Taping, Biofeedback, and Manual therapy   PLAN FOR NEXT SESSION: manual work to the vagina internally and use hip movements,use the [place] reveleum. See how it went with MRI, walking with increased step length and going down steps with a step over step pattern, see about stool leakage  Earlie Counts, PT 06/02/22 2:45 PM

## 2022-06-05 ENCOUNTER — Ambulatory Visit
Admission: RE | Admit: 2022-06-05 | Discharge: 2022-06-05 | Disposition: A | Discharge status: Home / Self Care | Payer: Medicare Other | Source: Ambulatory Visit | Attending: Pain Medicine | Admitting: Pain Medicine

## 2022-06-05 DIAGNOSIS — M533 Sacrococcygeal disorders, not elsewhere classified: Secondary | ICD-10-CM

## 2022-06-09 ENCOUNTER — Ambulatory Visit: Payer: Medicare Other | Admitting: Physical Therapy

## 2022-06-09 ENCOUNTER — Encounter: Payer: Self-pay | Admitting: Physical Therapy

## 2022-06-09 DIAGNOSIS — M6281 Muscle weakness (generalized): Secondary | ICD-10-CM

## 2022-06-09 DIAGNOSIS — R252 Cramp and spasm: Secondary | ICD-10-CM | POA: Diagnosis not present

## 2022-06-09 DIAGNOSIS — R102 Pelvic and perineal pain: Secondary | ICD-10-CM

## 2022-06-09 NOTE — Therapy (Signed)
OUTPATIENT PHYSICAL THERAPY TREATMENT NOTE   Patient Name: Joanne Hansen MRN: [id number] DOB:1946/01/07, 75 y.o., female Today's Date: 06/09/2022  PCP: Caren Macadam, MD REFERRING PROVIDER: Jaquita Folds, MD  END OF SESSION:   PT End of Session - 06/09/22 1307     Visit Number 7    Date for PT Re-Evaluation 06/30/22    Authorization Type Medicare    Authorization - Visit Number 7    Authorization - Number of Visits 10    PT Start Time 5456    PT Stop Time 2563    PT Time Calculation (min) 38 min    Activity Tolerance Patient tolerated treatment well    Behavior During Therapy [facility] for tasks assessed/performed             Past Medical History:  Diagnosis Date   Arthritis    Breast cancer ([place])    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   Family history of brain cancer    Family history of breast cancer    Family history of colon cancer    Fecal incontinence    05-16-2019 per pt only a little leakage but controllable (was in clinical trial for [facility] without benefit.)   Frequent headaches    History of syncope    pre-syncope  08/ 2015  per pt no issue with this since    Hyperlipidemia    Malignant neoplasm of upper-inner quadrant of right breast in female, estrogen receptor negative [facility]) oncologist-- dr Jana Hakim  dr moody   dx 08/ 2020--  invasive ductal carcinoma,  05-13-2019  s/p right breast lumpectomy    PAF (paroxysmal atrial fibrillation) ([place]) (05-16-2019   pt from [place], recently moved to [place] to be near daughter, has not established a cardiology yet--  previously seen by dr Lowella Dandy cossu (last office note dated 02-28-2018 scanned in epic)---  echo 04-15-2014  ef 55-60%, G1DD mild AR without stenosis, RVSp 37mHg   Personal history of radiation therapy    stopped 09/22/19    Rectal cyst    Thyroid goiter    pt ultrasound in epic 06-04-2014 , nodules, no bx done   Urinary incontinence, mixed    Past Surgical History:  Procedure  Laterality Date   ABDOMINAL HYSTERECTOMY  1978   w/  RSO  and APPENDECTOMY   BREAST BIOPSY Right 08/2009   benign   BREAST LUMPECTOMY WITH RADIOACTIVE SEED AND SENTINEL LYMPH NODE BIOPSY Right 05/15/2019   Procedure: RIGHT BREAST LUMPECTOMY WITH RADIOACTIVE SEED AND SENTINEL LYMPH NODE MAPPING;  Surgeon: CErroll Luna MD;  Location: [facility]  Service: General;  Laterality: Right;   BREAST SURGERY  2011   biospy of right breast   CATARACT EXTRACTION W/ INTRAOCULAR LENS  IMPLANT, BILATERAL  2004   COLONOSCOPY  11/21/2019   FLEXIBLE SIGMOIDOSCOPY N/A 05/18/2019   Procedure: FLEXIBLE SIGMOIDOSCOPY,  TRANSANAL EXCISION inclusion cyst;  Surgeon: TLeighton Ruff MD;  [facility] [facility]  Service: General;  Laterality: N/A;   gluteus medius repair  2017   LUMBAR LAMINECTOMY  2013   L5 -- S1   PORT-A-CATH REMOVAL Right 09/27/2019   Procedure: PORT REMOVAL;  Surgeon: CErroll Luna MD;  [facility]  Service: General;  Laterality: Right;   PORTACATH PLACEMENT Right 05/15/2019   Procedure: INSERTION PORT-A-CATH WITH ULTRASOUND;  Surgeon: CErroll Luna MD;  [facility]  Service: General;  Laterality: Right;   ROTATOR CUFF REPAIR Left 2001  Patient Active Problem List   Diagnosis Date Noted   Palpitations 01/20/2021   Anemia, macrocytic 09/25/2019   Paroxysmal atrial fibrillation ([place]) 08/02/2019   Dizziness 08/02/2019   Lower extremity edema 08/02/2019   Genetic testing 05/16/2019   Malignant neoplasm of upper-inner quadrant of right breast in female, estrogen receptor negative ([place]) 05/07/2019   Family history of breast cancer    Family history of colon cancer    Family history of brain cancer    Hyperlipidemia 11/08/2018   Fecal incontinence 11/08/2018   Urinary, incontinence, stress female 11/08/2018   REFERRING DIAG:  Z61.096 (ICD-10-CM) - Levator spasm  R15.9 (ICD-10-CM) - Incontinence of feces,  unspecified fecal incontinence type  N39.3 (ICD-10-CM) - SUI (stress urinary incontinence, female      THERAPY DIAG:  Cramp and spasm   Muscle weakness (generalized)   Pelvic pain   Rationale for Evaluation and Treatment Rehabilitation   ONSET DATE: 2021   SUBJECTIVE:                                                                                                                                                                                            SUBJECTIVE STATEMENT: I am talking to the doctor about my MRI today I am not able to move up to the next size dilator. My neighbor said I was not walking the same.    PAIN:  Are you having pain? Yes: NPRS scale: 2-8/10 Pain location: tailbone pain Pain description: deep  Aggravating factors: walking, sitting and makes it hard to go into standing Relieving factors: nothing   PAIN:  Are you having pain? Yes NPRS scale: 10/10, using the bigger dilator is 3/10 Pain location: Vaginal lower right of introitus   Pain type: burning Pain description: intermittent    Aggravating factors: penile penetration, vaginal exam Relieving factors: no penile penetration   PRECAUTIONS: Other: breast cancer   WEIGHT BEARING RESTRICTIONS No   FALLS:  Has patient fallen in last 6 months? No   LIVING ENVIRONMENT: Lives with: lives with their spouse     OCCUPATION: retired   PLOF: Independent   PATIENT GOALS ability to have intercourse without pain;    PERTINENT HISTORY:  Abdominal hysterectomy 1978; right breast cancer estrogen receptor negative; lumbar laminectomy   BOWEL MOVEMENT Pain with bowel movement: No Type of bowel movement:Type (Bristol Stool Scale)  type 2, Frequency daily, and Strain Yes Fully empty rectum: Yes: but sometimes has incomplete evacuation.  Leakage: Occurs: after a bowel movement             Consistency with leakage: liquid Pads: Yes: 3-4 Fiber supplement: Yes:  citrucel   URINATION Pain with urination:  No Fully empty bladder: Yes: dribbles after urination Stream: Strong Urgency: Yes: if she waits too long Frequency: 2-3 hours Leakage: Coughing, Sneezing, Exercise, and Lifting Pads: Yes: 3-4 liners   INTERCOURSE Pain with intercourse: Initial Penetration due to dryness Ability to have vaginal penetration:  No Climax: no  Marinoff Scale: 3/3 Has tried pelvic physical therapy for about 6 months (at [facility]). Used vaginal dilators and vibrator.   PREGNANCY Vaginal deliveries 1 Tearing No   PROLAPSE Rectocele small and distal       OBJECTIVE:    DIAGNOSTIC FINDINGS:  PVR of 12 ml ; biopsy was negative.    COGNITION:            Overall cognitive status: Within functional limits for tasks assessed                          SENSATION:            Light touch: Appears intact            Proprioception: Appears intact                  POSTURE: No Significant postural limitations               PELVIC ALIGNMENT:   LUMBARAROM/PROMlimited by 50-75% due to pain     LOWER EXTREMITY ROM:   Passive ROM Right eval Left eval  Hip extension -5 -5  Hip external rotation 50 35   (Blank rows = not tested)   LOWER EXTREMITY MMT:   MMT Right eval Left eval  Hip extension 3/5 3/5  Hip abduction 3/5 3/5  Hip internal rotation 4/5 4/5  Hip external rotation 4/5 4/5     PALPATION:      External Perineal Exam dryness notided                             Internal Pelvic Floor stitch noticed at 6 O'Clock, Tenderness located with Qtip inserted into the vagina. Therapist placed her index finger into the vaginal canal 1/4 of an inch but patient had pain so did not go further.    Patient confirms identification and approves PT to assess internal pelvic floor and treatment Yes   PELVIC MMT:   MMT eval 05/19/2022 05/26/2022  Vaginal 2/5 3/5 with weak lift 4/5 but takes time to relax  Internal Anal Sphincter        External Anal Sphincter        Puborectalis        Diastasis  Recti        (Blank rows = not tested)        The anal strength not tested due to her having some bleeding rectally yesterday.  TONE: increased   PROLAPSE: none   TODAY'S TREATMENT 06/09/2022 Manual: Soft tissue mobilization: Scar tissue mobilization: Myofascial release; fascial release of the urogenital diaphragm to release the tissue to assist with using the dilator.  Spinal mobilization: Internal pelvic floor techniques:No emotional/communication barriers or cognitive limitation. Patient is motivated to learn. Patient understands and agrees with treatment goals and plan. PT explains patient will be examined in standing, sitting, and lying down to see how their muscles and joints work. When they are ready, they will be asked to remove their underwear so PT can examine their perineum. The patient is also given the option  of providing their own chaperone as one is not provided in our facility. The patient also has the right and is explained the right to defer or refuse any part of the evaluation or treatment including the internal exam. With the patient's consent, PT will use one gloved finger to gently assess the muscles of the pelvic floor, seeing how well it contracts and relaxes and if there is muscle symmetry. After, the patient will get dressed and PT and patient will discuss exam findings and plan of care. PT and patient discuss plan of care, schedule, attendance policy and HEP activities.  Therapist manually working around the labia and introitus with [employer] Reveleum to lengthen the tissue going very slowly to monitor for pain Exercises: Stretches/mobility:stretched bilateral; manually stretched bilateral hamstring, hip rotators and hip adductors to prepare for soft tissue work of the pelvic floor    06/02/2022 Manual: Myofascial release:fascial release along the labias on the outside and inner surface with [facility] reveleum Internal pelvic floor techniques:No  emotional/communication barriers or cognitive limitation. Patient is motivated to learn. Patient understands and agrees with treatment goals and plan. PT explains patient will be examined in standing, sitting, and lying down to see how their muscles and joints work. When they are ready, they will be asked to remove their underwear so PT can examine their perineum. The patient is also given the option of providing their own chaperone as one is not provided in our facility. The patient also has the right and is explained the right to defer or refuse any part of the evaluation or treatment including the internal exam. With the patient's consent, PT will use one gloved finger to gently assess the muscles of the pelvic floor, seeing how well it contracts and relaxes and if there is muscle symmetry. After, the patient will get dressed and PT and patient will discuss exam findings and plan of care. PT and patient discuss plan of care, schedule, attendance policy and HEP activities.                    Going through the vaginal canal with 1 finger then with 2 fingers gently opening the vaginal canal                    Then bringing the index finger deeper into the vaginal canal working on the levator ani and obturator internist, then worked on the left aycocks canal.  Neuromuscular re-education: Form correction: walking with bigger step lengths and increased arm swing to reduce the tightness in the pelvic floor and coccyx pain Going up and down steps with alternate motion working the quads to work on control going down Exercises: Stretches/mobility:hip flexor stretch in standing on step and then in sitting to protect her back Gastroc stretch in standing     05/26/2022 Manual: Internal pelvic floor techniques:No emotional/communication barriers or cognitive limitation. Patient is motivated to learn. Patient understands and agrees with treatment goals and plan. PT explains patient will be examined in standing,  sitting, and lying down to see how their muscles and joints work. When they are ready, they will be asked to remove their underwear so PT can examine their perineum. The patient is also given the option of providing their own chaperone as one is not provided in our facility. The patient also has the right and is explained the right to defer or refuse any part of the evaluation or treatment including the internal exam. With the patient's consent,  PT will use one gloved finger to gently assess the muscles of the pelvic floor, seeing how well it contracts and relaxes and if there is muscle symmetry. After, the patient will get dressed and PT and patient will discuss exam findings and plan of care. PT and patient discuss plan of care, schedule, attendance policy and HEP activities.  Going through the vaginal canal working on the introitus, along the labia and perineal area going slowly and monitoring for pain using [employer] Reveleum Self-care: Educated patient on using the vibrator around the clitoris to work on the stimulation and blood flow       PATIENT EDUCATION:  05/14/2022 Education details: gave patient samples of vaginal moisturizer and cream with lidocaine, educated patient on how to manual massage the vulvar area Person educated: Patient Education method: Consulting civil engineer, Demonstration, and Handouts Education comprehension: verbalized understanding     HOME EXERCISE PROGRAM: See above   ASSESSMENT:   CLINICAL IMPRESSION: Patient is a 76 y.o. female who was seen today for physical therapy evaluation and treatment for SUI, fecal leakage, and levator spasm.  Patient has not been able to increase to the next size dilator yet due to pain. Patient tolerated the manual work this session with greater ease. Patient will be consulting with her doctor about her back and coccyx pain. Patient is progressing slowly at this time but the muscles could als be tight due to the back pain.  Patient will  benefit from skilled therapy to improve pain and function.      OBJECTIVE IMPAIRMENTS decreased activity tolerance, decreased coordination, decreased endurance, decreased strength, increased fascial restrictions, increased muscle spasms, impaired tone, and pain.    ACTIVITY LIMITATIONS continence and toileting   PARTICIPATION LIMITATIONS: interpersonal relationship   PERSONAL FACTORS Sex, Time since onset of injury/illness/exacerbation, and 3+ comorbidities:    Abdominal hysterectomy 1978; right breast cancer estrogen receptor negative; lumbar laminectomy are also affecting patient's functional outcome.    REHAB POTENTIAL: Excellent   CLINICAL DECISION MAKING: Evolving/moderate complexity   EVALUATION COMPLEXITY: Moderate     GOALS: Goals reviewed with patient? Yes   SHORT TERM GOALS: Target date: 05/05/2022   Patient is independent with pelvic drop and diaphragmatic breathing to relax her pelvic floor.  Baseline: Goal status: Met 05/19/2022   2.  Patient understands how to massage the external perineal area to relax the pelvic floor.  Baseline:  Goal status: Met 05/19/2022   3.  Patient able to have the Q-tip inserted into the vaginal canal without pain.  Baseline:  Goal status: Met 05/19/2022   4.  Patient is using the vaginal vibrator on the tissue to improve blood flow to the tissue and relax the tissue.  Baseline:  Goal status: Met 05/26/2022     LONG TERM GOALS: Target date: 06/30/2022    Patient is independent with using the vaginal dilators and on the largest one with pain </= 1/10.  Baseline:  Goal status: INITIAL   2.  Patient is able to have penile penetration with pain level </= 1/10 and possible with the assistance of the [place].  Baseline:  Goal status: INITIAL   3.  Patient reports her stool leakage has improved >/= 75% due to improved strength and coordination of the pelvic floor.  Baseline:  Goal status: INITIAL   4.  Patient reports she is able to hold  her urine when she has the urge to urinate and walk to the bathroom.  Baseline:  Goal status: INITIAL   5.  Patient reports her urinary leakage with coughing, sneezing, exercise and lifting improved >/= 75% due to the improved mobility of the pelvic floor muscles.  Baseline:  Goal status: INITIAL       PLAN: PT FREQUENCY: 1x/week   PT DURATION: 12 weeks   PLANNED INTERVENTIONS: Therapeutic exercises, Therapeutic activity, Neuromuscular re-education, Patient/Family education, Self Care, Dry Needling, Electrical stimulation, Taping, Biofeedback, and Manual therapy   PLAN FOR NEXT SESSION: manual work to the vagina internally and use hip movements,use the [place] harvest reveleum. walking with increased step length and going down steps with a step over step pattern, see about stool leakage  Earlie Counts, PT 06/09/22 4:09 PM

## 2022-06-16 ENCOUNTER — Encounter: Payer: Self-pay | Admitting: Hematology and Oncology

## 2022-06-16 ENCOUNTER — Other Ambulatory Visit: Payer: Self-pay | Admitting: *Deleted

## 2022-06-16 ENCOUNTER — Encounter: Payer: Self-pay | Admitting: Physical Therapy

## 2022-06-16 ENCOUNTER — Ambulatory Visit: Payer: Medicare Other | Admitting: Physical Therapy

## 2022-06-16 DIAGNOSIS — M6281 Muscle weakness (generalized): Secondary | ICD-10-CM

## 2022-06-16 DIAGNOSIS — R252 Cramp and spasm: Secondary | ICD-10-CM | POA: Diagnosis not present

## 2022-06-16 DIAGNOSIS — R102 Pelvic and perineal pain: Secondary | ICD-10-CM

## 2022-06-16 DIAGNOSIS — R899 Unspecified abnormal finding in specimens from other organs, systems and tissues: Secondary | ICD-10-CM

## 2022-06-16 DIAGNOSIS — Z171 Estrogen receptor negative status [ER-]: Secondary | ICD-10-CM

## 2022-06-16 NOTE — Therapy (Signed)
OUTPATIENT PHYSICAL THERAPY TREATMENT NOTE   Patient Name: Joanne Hansen MRN: [id number] DOB:[birth year], 76 y.o., female Today's Date: 06/16/2022  PCP: Caren Macadam, MD REFERRING PROVIDER: Jaquita Folds, MD  END OF SESSION:   PT End of Session - 06/16/22 1233     Visit Number 8    Date for PT Re-Evaluation 06/30/22    Authorization Type Medicare    Authorization - Visit Number 8    Authorization - Number of Visits 8    PT Start Time 5681    PT Stop Time 1310    PT Time Calculation (min) 40 min    Activity Tolerance Patient tolerated treatment well    Behavior During Therapy [facility] for tasks assessed/performed             Past Medical History:  Diagnosis Date   Arthritis    Breast cancer ([place])    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   Family history of brain cancer    Family history of breast cancer    Family history of colon cancer    Fecal incontinence    05-16-2019 per pt only a little leakage but controllable (was in clinical trial for [facility] without benefit.)   Frequent headaches    History of syncope    pre-syncope  08/ 2015  per pt no issue with this since    Hyperlipidemia    Malignant neoplasm of upper-inner quadrant of right breast in female, estrogen receptor negative [facility]) oncologist-- dr Jana Hakim  dr moody   dx 08/ 2020--  invasive ductal carcinoma,  05-13-2019  s/p right breast lumpectomy    PAF (paroxysmal atrial fibrillation) ([place]) (05-16-2019   pt from [place], recently moved to [place] to be near daughter, has not established a cardiology yet--  previously seen by dr Lowella Dandy cossu (last office note dated 02-28-2018 scanned in epic)---  echo 04-15-2014  ef 55-60%, G1DD mild AR without stenosis, RVSp 2mHg   Personal history of radiation therapy    stopped 09/22/19    Rectal cyst    Thyroid goiter    pt ultrasound in epic 06-04-2014 , nodules, no bx done   Urinary incontinence, mixed    Past Surgical History:  Procedure  Laterality Date   ABDOMINAL HYSTERECTOMY  1978   w/  RSO  and APPENDECTOMY   BREAST BIOPSY Right 08/2009   benign   BREAST LUMPECTOMY WITH RADIOACTIVE SEED AND SENTINEL LYMPH NODE BIOPSY Right 05/15/2019   Procedure: RIGHT BREAST LUMPECTOMY WITH RADIOACTIVE SEED AND SENTINEL LYMPH NODE MAPPING;  Surgeon: CErroll Luna MD;  [place] [facility]  Service: General;  Laterality: Right;   BREAST SURGERY  2011   biospy of right breast   CATARACT EXTRACTION W/ INTRAOCULAR LENS  IMPLANT, BILATERAL  2004   COLONOSCOPY  11/21/2019   FLEXIBLE SIGMOIDOSCOPY N/A 05/18/2019   Procedure: FLEXIBLE SIGMOIDOSCOPY,  TRANSANAL EXCISION inclusion cyst;  Surgeon: TLeighton Ruff MD;  [place] [facility]  Service: General;  Laterality: N/A;   gluteus medius repair  2017   LUMBAR LAMINECTOMY  2013   L5 -- S1   PORT-A-CATH REMOVAL Right 09/27/2019   Procedure: PORT REMOVAL;  Surgeon: CErroll Luna MD;  Location: [facility]  Service: General;  Laterality: Right;   PORTACATH PLACEMENT Right 05/15/2019   Procedure: INSERTION PORT-A-CATH WITH ULTRASOUND;  Surgeon: CErroll Luna MD;  Location[place] [facility]  Service: General;  Laterality: Right;   ROTATOR CUFF REPAIR Left 2001  Patient Active Problem List   Diagnosis Date Noted   Palpitations 01/20/2021   Anemia, macrocytic 09/25/2019   Paroxysmal atrial fibrillation ([place]) 08/02/2019   Dizziness 08/02/2019   Lower extremity edema 08/02/2019   Genetic testing 05/16/2019   Malignant neoplasm of upper-inner quadrant of right breast in female, estrogen receptor negative ([place]) 05/07/2019   Family history of breast cancer    Family history of colon cancer    Family history of brain cancer    Hyperlipidemia 11/08/2018   Fecal incontinence 11/08/2018   Urinary, incontinence, stress female 11/08/2018   REFERRING DIAG:  O67.672 (ICD-10-CM) - Levator spasm  R15.9 (ICD-10-CM) - Incontinence of feces,  unspecified fecal incontinence type  N39.3 (ICD-10-CM) - SUI (stress urinary incontinence, female      THERAPY DIAG:  Cramp and spasm   Muscle weakness (generalized)   Pelvic pain   Rationale for Evaluation and Treatment Rehabilitation   ONSET DATE: 2021   SUBJECTIVE:                                                                                                                                                                                            SUBJECTIVE STATEMENT: I had my injection in my back yesterday. I am doing the dilator work better. Some days I am able to use the bigger size and other days I am not.    PAIN:  Are you having pain? Yes: NPRS scale: 2-8/10 Pain location: tailbone pain Pain description: deep  Aggravating factors: walking, sitting and makes it hard to go into standing Relieving factors: nothing   PAIN:  Are you having pain? Yes NPRS scale: 10/10, using the bigger dilator is 3/10 Pain location: Vaginal lower right of introitus   Pain type: burning Pain description: intermittent    Aggravating factors: penile penetration, vaginal exam Relieving factors: no penile penetration   PRECAUTIONS: Other: breast cancer   WEIGHT BEARING RESTRICTIONS No   FALLS:  Has patient fallen in last 6 months? No   LIVING ENVIRONMENT: Lives with: lives with their spouse     OCCUPATION: retired   PLOF: Independent   PATIENT GOALS ability to have intercourse without pain;    PERTINENT HISTORY:  Abdominal hysterectomy 1978; right breast cancer estrogen receptor negative; lumbar laminectomy   BOWEL MOVEMENT Pain with bowel movement: No Type of bowel movement:Type (Bristol Stool Scale)  type 2, Frequency daily, and Strain Yes Fully empty rectum: Yes: but sometimes has incomplete evacuation.  Leakage: Occurs: after a bowel movement             Consistency with leakage: liquid Pads: Yes: 3-4 Fiber supplement: Yes:  citrucel   URINATION Pain with  urination: No Fully empty bladder: Yes: dribbles after urination Stream: Strong Urgency: Yes: if she waits too long Frequency: 2-3 hours Leakage: Coughing, Sneezing, Exercise, and Lifting Pads: Yes: 3-4 liners   INTERCOURSE Pain with intercourse: Initial Penetration due to dryness Ability to have vaginal penetration:  No Climax: no  Marinoff Scale: 3/3 Has tried pelvic physical therapy for about 6 months (at [facility]). Used vaginal dilators and vibrator.   PREGNANCY Vaginal deliveries 1 Tearing No   PROLAPSE Rectocele small and distal       OBJECTIVE:    DIAGNOSTIC FINDINGS:  PVR of 12 ml ; biopsy was negative.    COGNITION:            Overall cognitive status: Within functional limits for tasks assessed                          SENSATION:            Light touch: Appears intact            Proprioception: Appears intact                  POSTURE: No Significant postural limitations               PELVIC ALIGNMENT:   LUMBARAROM/PROMlimited by 50-75% due to pain     LOWER EXTREMITY ROM:   Passive ROM Right eval Left eval  Hip extension -5 -5  Hip external rotation 50 35   (Blank rows = not tested)   LOWER EXTREMITY MMT:   MMT Right eval Left eval  Hip extension 3/5 3/5  Hip abduction 3/5 3/5  Hip internal rotation 4/5 4/5  Hip external rotation 4/5 4/5     PALPATION:      External Perineal Exam dryness notided                             Internal Pelvic Floor stitch noticed at 6 O'Clock, Tenderness located with Qtip inserted into the vagina. Therapist placed her index finger into the vaginal canal 1/4 of an inch but patient had pain so did not go further.    Patient confirms identification and approves PT to assess internal pelvic floor and treatment Yes   PELVIC MMT:   MMT eval 05/19/2022 05/26/2022  Vaginal 2/5 3/5 with weak lift 4/5 but takes time to relax  Internal Anal Sphincter        External Anal Sphincter        Puborectalis         Diastasis Recti        (Blank rows = not tested)        The anal strength not tested due to her having some bleeding rectally yesterday.  TONE: increased   PROLAPSE: none   TODAY'S TREATMENT 06/16/2022 Manual: Internal pelvic floor techniques:No emotional/communication barriers or cognitive limitation. Patient is motivated to learn. Patient understands and agrees with treatment goals and plan. PT explains patient will be examined in standing, sitting, and lying down to see how their muscles and joints work. When they are ready, they will be asked to remove their underwear so PT can examine their perineum. The patient is also given the option of providing their own chaperone as one is not provided in our facility. The patient also has the right and is explained the right to defer or  refuse any part of the evaluation or treatment including the internal exam. With the patient's consent, PT will use one gloved finger to gently assess the muscles of the pelvic floor, seeing how well it contracts and relaxes and if there is muscle symmetry. After, the patient will get dressed and PT and patient will discuss exam findings and plan of care. PT and patient discuss plan of care, schedule, attendance policy and HEP activities.  Therapist index finger in the vaginal canal working on the levator ani, vulvar area and introitus to lengthen the tissue using the [employer], then placed 2 fingers side by side to open up the canal. Manual stretching of the introitus with one finger internal and other external Self-care: Educated patient on using a handle with her dilator and where to get it so it will be easier to use the dilator.     06/09/2022 Manual: Scar tissue mobilization: Myofascial release; fascial release of the urogenital diaphragm to release the tissue to assist with using the dilator.  Spinal mobilization: Internal pelvic floor techniques:No emotional/communication barriers or  cognitive limitation. Patient is motivated to learn. Patient understands and agrees with treatment goals and plan. PT explains patient will be examined in standing, sitting, and lying down to see how their muscles and joints work. When they are ready, they will be asked to remove their underwear so PT can examine their perineum. The patient is also given the option of providing their own chaperone as one is not provided in our facility. The patient also has the right and is explained the right to defer or refuse any part of the evaluation or treatment including the internal exam. With the patient's consent, PT will use one gloved finger to gently assess the muscles of the pelvic floor, seeing how well it contracts and relaxes and if there is muscle symmetry. After, the patient will get dressed and PT and patient will discuss exam findings and plan of care. PT and patient discuss plan of care, schedule, attendance policy and HEP activities.  Therapist manually working around the labia and introitus with [employer] Reveleum to lengthen the tissue going very slowly to monitor for pain Exercises: Stretches/mobility:stretched bilateral; manually stretched bilateral hamstring, hip rotators and hip adductors to prepare for soft tissue work of the pelvic floor    06/02/2022 Manual: Myofascial release:fascial release along the labias on the outside and inner surface with [facility] reveleum Internal pelvic floor techniques:No emotional/communication barriers or cognitive limitation. Patient is motivated to learn. Patient understands and agrees with treatment goals and plan. PT explains patient will be examined in standing, sitting, and lying down to see how their muscles and joints work. When they are ready, they will be asked to remove their underwear so PT can examine their perineum. The patient is also given the option of providing their own chaperone as one is not provided in our facility. The patient  also has the right and is explained the right to defer or refuse any part of the evaluation or treatment including the internal exam. With the patient's consent, PT will use one gloved finger to gently assess the muscles of the pelvic floor, seeing how well it contracts and relaxes and if there is muscle symmetry. After, the patient will get dressed and PT and patient will discuss exam findings and plan of care. PT and patient discuss plan of care, schedule, attendance policy and HEP activities.  Going through the vaginal canal with 1 finger then with 2 fingers gently opening the vaginal canal                    Then bringing the index finger deeper into the vaginal canal working on the levator ani and obturator internist, then worked on the left aycocks canal.  Neuromuscular re-education: Form correction: walking with bigger step lengths and increased arm swing to reduce the tightness in the pelvic floor and coccyx pain Going up and down steps with alternate motion working the quads to work on control going down Exercises: Stretches/mobility:hip flexor stretch in standing on step and then in sitting to protect her back Gastroc stretch in standing     05/26/2022 Manual: Internal pelvic floor techniques:No emotional/communication barriers or cognitive limitation. Patient is motivated to learn. Patient understands and agrees with treatment goals and plan. PT explains patient will be examined in standing, sitting, and lying down to see how their muscles and joints work. When they are ready, they will be asked to remove their underwear so PT can examine their perineum. The patient is also given the option of providing their own chaperone as one is not provided in our facility. The patient also has the right and is explained the right to defer or refuse any part of the evaluation or treatment including the internal exam. With the patient's consent, PT will use one gloved finger to gently  assess the muscles of the pelvic floor, seeing how well it contracts and relaxes and if there is muscle symmetry. After, the patient will get dressed and PT and patient will discuss exam findings and plan of care. PT and patient discuss plan of care, schedule, attendance policy and HEP activities.  Going through the vaginal canal working on the introitus, along the labia and perineal area going slowly and monitoring for pain using [employer] Reveleum Self-care: Educated patient on using the vibrator around the clitoris to work on the stimulation and blood flow        PATIENT EDUCATION:  05/14/2022 Education details: gave patient samples of vaginal moisturizer and cream with lidocaine, educated patient on how to manual massage the vulvar area Person educated: Patient Education method: Consulting civil engineer, Demonstration, and Handouts Education comprehension: verbalized understanding     HOME EXERCISE PROGRAM: See above   ASSESSMENT:   CLINICAL IMPRESSION: Patient is a 76 y.o. female who was seen today for physical therapy evaluation and treatment for SUI, fecal leakage, and levator spasm.  Patient is able to use the second dilator on some days. Patient was able to have therapist place her index finger into the vaginal canal to work on the levator ani for the first time and place 2 finger into the introitus to expand it. Patient increased pelvic pain may be coming from her back or sacrum due to not having pain with internal work the day after her injections.   Patient has gotten injections for her back and coccyx pain. Patient will benefit from skilled therapy to improve pain and function.      OBJECTIVE IMPAIRMENTS decreased activity tolerance, decreased coordination, decreased endurance, decreased strength, increased fascial restrictions, increased muscle spasms, impaired tone, and pain.    ACTIVITY LIMITATIONS continence and toileting   PARTICIPATION LIMITATIONS: interpersonal relationship    PERSONAL FACTORS Sex, Time since onset of injury/illness/exacerbation, and 3+ comorbidities:    Abdominal hysterectomy 1978; right breast cancer estrogen receptor negative; lumbar laminectomy are also affecting patient's functional outcome.  REHAB POTENTIAL: Excellent   CLINICAL DECISION MAKING: Evolving/moderate complexity   EVALUATION COMPLEXITY: Moderate     GOALS: Goals reviewed with patient? Yes   SHORT TERM GOALS: Target date: 05/05/2022   Patient is independent with pelvic drop and diaphragmatic breathing to relax her pelvic floor.  Baseline: Goal status: Met 05/19/2022   2.  Patient understands how to massage the external perineal area to relax the pelvic floor.  Baseline:  Goal status: Met 05/19/2022   3.  Patient able to have the Q-tip inserted into the vaginal canal without pain.  Baseline:  Goal status: Met 05/19/2022   4.  Patient is using the vaginal vibrator on the tissue to improve blood flow to the tissue and relax the tissue.  Baseline:  Goal status: Met 05/26/2022     LONG TERM GOALS: Target date: 06/30/2022    Patient is independent with using the vaginal dilators and on the largest one with pain </= 1/10.  Baseline:  Goal status: ongoing 06/16/2022   2.  Patient is able to have penile penetration with pain level </= 1/10 and possible with the assistance of the [place].  Baseline:  Goal status: INITIAL   3.  Patient reports her stool leakage has improved >/= 75% due to improved strength and coordination of the pelvic floor.  Baseline:  Goal status: INITIAL   4.  Patient reports she is able to hold her urine when she has the urge to urinate and walk to the bathroom.  Baseline:  Goal status: INITIAL   5.  Patient reports her urinary leakage with coughing, sneezing, exercise and lifting improved >/= 75% due to the improved mobility of the pelvic floor muscles.  Baseline:  Goal status: INITIAL       PLAN: PT FREQUENCY: 1x/week   PT DURATION: 12  weeks   PLANNED INTERVENTIONS: Therapeutic exercises, Therapeutic activity, Neuromuscular re-education, Patient/Family education, Self Care, Dry Needling, Electrical stimulation, Taping, Biofeedback, and Manual therapy   PLAN FOR NEXT SESSION: manual work to the vagina internally and use hip movements,use the [employer] harvest reveleum. see about stool leakage and urine leakage Earlie Counts, PT 06/16/22 1:15 PM

## 2022-06-21 ENCOUNTER — Other Ambulatory Visit: Payer: Self-pay | Admitting: *Deleted

## 2022-06-21 DIAGNOSIS — C50211 Malignant neoplasm of upper-inner quadrant of right female breast: Secondary | ICD-10-CM

## 2022-06-23 ENCOUNTER — Ambulatory Visit: Payer: Medicare Other | Attending: Obstetrics and Gynecology | Admitting: Physical Therapy

## 2022-06-23 ENCOUNTER — Encounter: Payer: Self-pay | Admitting: Physical Therapy

## 2022-06-23 DIAGNOSIS — M6281 Muscle weakness (generalized): Secondary | ICD-10-CM | POA: Diagnosis present

## 2022-06-23 DIAGNOSIS — R252 Cramp and spasm: Secondary | ICD-10-CM | POA: Insufficient documentation

## 2022-06-23 DIAGNOSIS — R102 Pelvic and perineal pain: Secondary | ICD-10-CM | POA: Insufficient documentation

## 2022-06-23 NOTE — Therapy (Signed)
OUTPATIENT PHYSICAL THERAPY TREATMENT NOTE   Patient Name: Joanne Hansen MRN: [id number] DOB:07/01/1946, 76 y.o., female Today's Date: 06/23/2022  PCP: Caren Macadam, MD REFERRING PROVIDER: Jaquita Folds, MD  END OF SESSION:   PT End of Session - 06/23/22 1231     Visit Number 9    Date for PT Re-Evaluation 06/30/22    Authorization Type Medicare    Authorization - Visit Number 9    Authorization - Number of Visits 10    PT Start Time 5462    PT Stop Time 1310    PT Time Calculation (min) 40 min    Activity Tolerance Patient tolerated treatment well    Behavior During Therapy [facility] for tasks assessed/performed             Past Medical History:  Diagnosis Date   Arthritis    Breast cancer ([place])    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   Family history of brain cancer    Family history of breast cancer    Family history of colon cancer    Fecal incontinence    05-16-2019 per pt only a little leakage but controllable (was in clinical trial for [facility] without benefit.)   Frequent headaches    History of syncope    pre-syncope  08/ 2015  per pt no issue with this since    Hyperlipidemia    Malignant neoplasm of upper-inner quadrant of right breast in female, estrogen receptor negative [facility]) oncologist-- dr Jana Hakim  dr moody   dx 08/ 2020--  invasive ductal carcinoma,  05-13-2019  s/p right breast lumpectomy    PAF (paroxysmal atrial fibrillation) ([place]) (05-16-2019   pt from [place], recently moved to [place] to be near daughter, has not established a cardiology yet--  previously seen by dr Lowella Dandy cossu (last office note dated 02-28-2018 scanned in epic)---  echo 04-15-2014  ef 55-60%, G1DD mild AR without stenosis, RVSp 82mHg   Personal history of radiation therapy    stopped 09/22/19    Rectal cyst    Thyroid goiter    pt ultrasound in epic 06-04-2014 , nodules, no bx done   Urinary incontinence, mixed    Past Surgical History:  Procedure  Laterality Date   ABDOMINAL HYSTERECTOMY  1978   w/  RSO  and APPENDECTOMY   BREAST BIOPSY Right 08/2009   benign   BREAST LUMPECTOMY WITH RADIOACTIVE SEED AND SENTINEL LYMPH NODE BIOPSY Right 05/15/2019   Procedure: RIGHT BREAST LUMPECTOMY WITH RADIOACTIVE SEED AND SENTINEL LYMPH NODE MAPPING;  Surgeon: CErroll Luna MD;  [place] [facility]  Service: General;  Laterality: Right;   BREAST SURGERY  2011   biospy of right breast   CATARACT EXTRACTION W/ INTRAOCULAR LENS  IMPLANT, BILATERAL  2004   COLONOSCOPY  11/21/2019   FLEXIBLE SIGMOIDOSCOPY N/A 05/18/2019   Procedure: FLEXIBLE SIGMOIDOSCOPY,  TRANSANAL EXCISION inclusion cyst;  Surgeon: TLeighton Ruff MD;  [place] [facility]  Service: General;  Laterality: N/A;   gluteus medius repair  2017   LUMBAR LAMINECTOMY  2013   L5 -- S1   PORT-A-CATH REMOVAL Right 09/27/2019   Procedure: PORT REMOVAL;  Surgeon: CErroll Luna MD;  [place] [facility]  Service: General;  Laterality: Right;   PORTACATH PLACEMENT Right 05/15/2019   Procedure: INSERTION PORT-A-CATH WITH ULTRASOUND;  Surgeon: CErroll Luna MD;  [place] [facility]  Service: General;  Laterality: Right;   ROTATOR CUFF REPAIR Left 2001  Patient Active Problem List   Diagnosis Date Noted   Palpitations 01/20/2021   Anemia, macrocytic 09/25/2019   Paroxysmal atrial fibrillation ([place]) 08/02/2019   Dizziness 08/02/2019   Lower extremity edema 08/02/2019   Genetic testing 05/16/2019   Malignant neoplasm of upper-inner quadrant of right breast in female, estrogen receptor negative ([place]) 05/07/2019   Family history of breast cancer    Family history of colon cancer    Family history of brain cancer    Hyperlipidemia 11/08/2018   Fecal incontinence 11/08/2018   Urinary, incontinence, stress female 11/08/2018   REFERRING DIAG:  W96.759 (ICD-10-CM) - Levator spasm  R15.9 (ICD-10-CM) - Incontinence of feces,  unspecified fecal incontinence type  N39.3 (ICD-10-CM) - SUI (stress urinary incontinence, female      THERAPY DIAG:  Cramp and spasm   Muscle weakness (generalized)   Pelvic pain   Rationale for Evaluation and Treatment Rehabilitation   ONSET DATE: 2021   SUBJECTIVE:                                                                                                                                                                                            SUBJECTIVE STATEMENT: My back is not feeling better.  Patient is on the second dilator. The last 2 times was able to go into the second dilator without using the first one. I am having the tailbone pain.  PAIN:  Are you having pain? Yes: NPRS scale: 2-8/10 Pain location: tailbone pain Pain description: deep  Aggravating factors: walking, sitting and makes it hard to go into standing Relieving factors: nothing   PAIN:  Are you having pain? Yes NPRS scale: 10/10, using the bigger dilator is 3/10 Pain location: Vaginal lower right of introitus   Pain type: burning Pain description: intermittent    Aggravating factors: penile penetration, vaginal exam Relieving factors: no penile penetration   PRECAUTIONS: Other: breast cancer   WEIGHT BEARING RESTRICTIONS No   FALLS:  Has patient fallen in last 6 months? No   LIVING ENVIRONMENT: Lives with: lives with their spouse     OCCUPATION: retired   PLOF: Independent   PATIENT GOALS ability to have intercourse without pain;    PERTINENT HISTORY:  Abdominal hysterectomy 1978; right breast cancer estrogen receptor negative; lumbar laminectomy   BOWEL MOVEMENT Pain with bowel movement: No Type of bowel movement:Type (Bristol Stool Scale)  type 2, Frequency daily, and Strain Yes Fully empty rectum: Yes: but sometimes has incomplete evacuation.  Leakage: Occurs: after a bowel movement             Consistency with leakage: liquid Pads: Yes: 3-4  Fiber supplement: Yes:  citrucel   URINATION Pain with urination: No Fully empty bladder: Yes: dribbles after urination Stream: Strong Urgency: Yes: if she waits too long Frequency: 2-3 hours Leakage: Coughing, Sneezing, Exercise, and Lifting Pads: Yes: 3-4 liners   INTERCOURSE Pain with intercourse: Initial Penetration due to dryness Ability to have vaginal penetration:  No Climax: no  Marinoff Scale: 3/3 Has tried pelvic physical therapy for about 6 months (at [facility]). Used vaginal dilators and vibrator.   PREGNANCY Vaginal deliveries 1 Tearing No   PROLAPSE Rectocele small and distal       OBJECTIVE:    DIAGNOSTIC FINDINGS:  PVR of 12 ml ; biopsy was negative.    COGNITION:            Overall cognitive status: Within functional limits for tasks assessed                          SENSATION:            Light touch: Appears intact            Proprioception: Appears intact                  POSTURE: No Significant postural limitations               PELVIC ALIGNMENT:   LUMBARAROM/PROMlimited by 50-75% due to pain     LOWER EXTREMITY ROM:   Passive ROM Right eval Left eval  Hip extension -5 -5  Hip external rotation 50 35   (Blank rows = not tested)   LOWER EXTREMITY MMT:   MMT Right eval Left eval  Hip extension 3/5 3/5  Hip abduction 3/5 3/5  Hip internal rotation 4/5 4/5  Hip external rotation 4/5 4/5     PALPATION:      External Perineal Exam dryness notided                             Internal Pelvic Floor stitch noticed at 6 O'Clock, Tenderness located with Qtip inserted into the vagina. Therapist placed her index finger into the vaginal canal 1/4 of an inch but patient had pain so did not go further.    Patient confirms identification and approves PT to assess internal pelvic floor and treatment Yes   PELVIC MMT:   MMT eval 05/19/2022 05/26/2022  Vaginal 2/5 3/5 with weak lift 4/5 but takes time to relax  Internal Anal Sphincter        External Anal  Sphincter        Puborectalis        Diastasis Recti        (Blank rows = not tested)        The anal strength not tested due to her having some bleeding rectally yesterday.  TONE: increased   PROLAPSE: none   TODAY'S TREATMENT 06/23/2022 Manual: Myofascial release:along the hip adductors and other hand on the side of the pubic rami to release the tissue and monitor for pain Internal pelvic floor techniques:No emotional/communication barriers or cognitive limitation. Patient is motivated to learn. Patient understands and agrees with treatment goals and plan. PT explains patient will be examined in standing, sitting, and lying down to see how their muscles and joints work. When they are ready, they will be asked to remove their underwear so PT can examine their perineum. The patient is also given the option of  providing their own chaperone as one is not provided in our facility. The patient also has the right and is explained the right to defer or refuse any part of the evaluation or treatment including the internal exam. With the patient's consent, PT will use one gloved finger to gently assess the muscles of the pelvic floor, seeing how well it contracts and relaxes and if there is muscle symmetry. After, the patient will get dressed and PT and patient will discuss exam findings and plan of care. PT and patient discuss plan of care, schedule, attendance policy and HEP activities.  Using the [facility] Reveleum working with one finger in the vaginal canal and other externally on the urogenital diaphragm releasing the sides of the vaginal canal and monitoring for pain  One finger in the vaginal canal working on the levator ani and releasing the tension in the muscles    06/16/2022 Manual: Internal pelvic floor techniques:No emotional/communication barriers or cognitive limitation. Patient is motivated to learn. Patient understands and agrees with treatment goals and plan. PT explains patient  will be examined in standing, sitting, and lying down to see how their muscles and joints work. When they are ready, they will be asked to remove their underwear so PT can examine their perineum. The patient is also given the option of providing their own chaperone as one is not provided in our facility. The patient also has the right and is explained the right to defer or refuse any part of the evaluation or treatment including the internal exam. With the patient's consent, PT will use one gloved finger to gently assess the muscles of the pelvic floor, seeing how well it contracts and relaxes and if there is muscle symmetry. After, the patient will get dressed and PT and patient will discuss exam findings and plan of care. PT and patient discuss plan of care, schedule, attendance policy and HEP activities.  Therapist index finger in the vaginal canal working on the levator ani, vulvar area and introitus to lengthen the tissue using the [employer], then placed 2 fingers side by side to open up the canal. Manual stretching of the introitus with one finger internal and other external Self-care: Educated patient on using a handle with her dilator and where to get it so it will be easier to use the dilator.     06/09/2022 Manual: Scar tissue mobilization: Myofascial release; fascial release of the urogenital diaphragm to release the tissue to assist with using the dilator.  Spinal mobilization: Internal pelvic floor techniques:No emotional/communication barriers or cognitive limitation. Patient is motivated to learn. Patient understands and agrees with treatment goals and plan. PT explains patient will be examined in standing, sitting, and lying down to see how their muscles and joints work. When they are ready, they will be asked to remove their underwear so PT can examine their perineum. The patient is also given the option of providing their own chaperone as one is not provided in our facility.  The patient also has the right and is explained the right to defer or refuse any part of the evaluation or treatment including the internal exam. With the patient's consent, PT will use one gloved finger to gently assess the muscles of the pelvic floor, seeing how well it contracts and relaxes and if there is muscle symmetry. After, the patient will get dressed and PT and patient will discuss exam findings and plan of care. PT and patient discuss plan of care, schedule, attendance policy  and HEP activities.  Therapist manually working around the labia and introitus with [employer] Reveleum to lengthen the tissue going very slowly to monitor for pain Exercises: Stretches/mobility:stretched bilateral; manually stretched bilateral hamstring, hip rotators and hip adductors to prepare for soft tissue work of the pelvic floor  PATIENT EDUCATION:  05/14/2022 Education details: gave patient samples of vaginal moisturizer and cream with lidocaine, educated patient on how to manual massage the vulvar area Person educated: Patient Education method: Consulting civil engineer, Demonstration, and Handouts Education comprehension: verbalized understanding     HOME EXERCISE PROGRAM: See above   ASSESSMENT:   CLINICAL IMPRESSION: Patient is a 76 y.o. female who was seen today for physical therapy evaluation and treatment for SUI, fecal leakage, and levator spasm.  Stool leakage has improved by 30%. Patient is able to use the second dilator without needing to use the smaller dilator first. Patient was able to have the therapist work internally without pain for first time. Patient continues to have coccyx pain.   Patient will benefit from skilled therapy to improve pain and function.      OBJECTIVE IMPAIRMENTS decreased activity tolerance, decreased coordination, decreased endurance, decreased strength, increased fascial restrictions, increased muscle spasms, impaired tone, and pain.    ACTIVITY LIMITATIONS continence and  toileting   PARTICIPATION LIMITATIONS: interpersonal relationship   PERSONAL FACTORS Sex, Time since onset of injury/illness/exacerbation, and 3+ comorbidities:    Abdominal hysterectomy 1978; right breast cancer estrogen receptor negative; lumbar laminectomy are also affecting patient's functional outcome.    REHAB POTENTIAL: Excellent   CLINICAL DECISION MAKING: Evolving/moderate complexity   EVALUATION COMPLEXITY: Moderate     GOALS: Goals reviewed with patient? Yes   SHORT TERM GOALS: Target date: 05/05/2022   Patient is independent with pelvic drop and diaphragmatic breathing to relax her pelvic floor.  Baseline: Goal status: Met 05/19/2022   2.  Patient understands how to massage the external perineal area to relax the pelvic floor.  Baseline:  Goal status: Met 05/19/2022   3.  Patient able to have the Q-tip inserted into the vaginal canal without pain.  Baseline:  Goal status: Met 05/19/2022   4.  Patient is using the vaginal vibrator on the tissue to improve blood flow to the tissue and relax the tissue.  Baseline:  Goal status: Met 05/26/2022     LONG TERM GOALS: Target date: 06/30/2022    Patient is independent with using the vaginal dilators and on the largest one with pain </= 1/10.  Baseline:  Goal status: ongoing 06/16/2022   2.  Patient is able to have penile penetration with pain level </= 1/10 and possible with the assistance of the [place].  Baseline:  Goal status: INITIAL   3.  Patient reports her stool leakage has improved >/= 75% due to improved strength and coordination of the pelvic floor.  Baseline:  Goal status: INITIAL   4.  Patient reports she is able to hold her urine when she has the urge to urinate and walk to the bathroom.  Baseline:  Goal status: INITIAL   5.  Patient reports her urinary leakage with coughing, sneezing, exercise and lifting improved >/= 75% due to the improved mobility of the pelvic floor muscles.  Baseline:  Goal status:  INITIAL       PLAN: PT FREQUENCY: 1x/week   PT DURATION: 12 weeks   PLANNED INTERVENTIONS: Therapeutic exercises, Therapeutic activity, Neuromuscular re-education, Patient/Family education, Self Care, Dry Needling, Electrical stimulation, Taping, Biofeedback, and Manual therapy   PLAN  FOR NEXT SESSION: manual work to the vagina internally and use hip movements,use the [employer] reveleum.  urine leakage, write 10th visit note  Earlie Counts, PT 06/23/22 2:01 PM

## 2022-06-25 ENCOUNTER — Other Ambulatory Visit: Payer: Self-pay

## 2022-06-25 ENCOUNTER — Inpatient Hospital Stay: Payer: Medicare Other | Attending: Hematology and Oncology

## 2022-06-25 DIAGNOSIS — C50211 Malignant neoplasm of upper-inner quadrant of right female breast: Secondary | ICD-10-CM | POA: Diagnosis not present

## 2022-06-25 DIAGNOSIS — Z171 Estrogen receptor negative status [ER-]: Secondary | ICD-10-CM | POA: Insufficient documentation

## 2022-06-25 DIAGNOSIS — R899 Unspecified abnormal finding in specimens from other organs, systems and tissues: Secondary | ICD-10-CM

## 2022-06-25 LAB — CBC WITH DIFFERENTIAL (CANCER CENTER ONLY)
Abs Immature Granulocytes: 0.01 10*3/uL (ref 0.00–0.07)
Basophils Absolute: 0 10*3/uL (ref 0.0–0.1)
Basophils Relative: 1 %
Eosinophils Absolute: 0.1 10*3/uL (ref 0.0–0.5)
Eosinophils Relative: 3 %
HCT: 40.4 % (ref 36.0–46.0)
Hemoglobin: 13.6 g/dL (ref 12.0–15.0)
Immature Granulocytes: 0 %
Lymphocytes Relative: 31 %
Lymphs Abs: 1.5 10*3/uL (ref 0.7–4.0)
MCH: 33.5 pg (ref 26.0–34.0)
MCHC: 33.7 g/dL (ref 30.0–36.0)
MCV: 99.5 fL (ref 80.0–100.0)
Monocytes Absolute: 0.6 10*3/uL (ref 0.1–1.0)
Monocytes Relative: 13 %
Neutro Abs: 2.5 10*3/uL (ref 1.7–7.7)
Neutrophils Relative %: 52 %
Platelet Count: 199 10*3/uL (ref 150–400)
RBC: 4.06 MIL/uL (ref 3.87–5.11)
RDW: 11.9 % (ref 11.5–15.5)
WBC Count: 4.9 10*3/uL (ref 4.0–10.5)
nRBC: 0 % (ref 0.0–0.2)

## 2022-06-25 LAB — CMP (CANCER CENTER ONLY)
ALT: 12 U/L (ref 0–44)
AST: 16 U/L (ref 15–41)
Albumin: 4.2 g/dL (ref 3.5–5.0)
Alkaline Phosphatase: 55 U/L (ref 38–126)
Anion gap: 6 (ref 5–15)
BUN: 16 mg/dL (ref 8–23)
CO2: 29 mmol/L (ref 22–32)
Calcium: 9.4 mg/dL (ref 8.9–10.3)
Chloride: 105 mmol/L (ref 98–111)
Creatinine: 0.7 mg/dL (ref 0.44–1.00)
GFR, Estimated: >60 mL/min (ref >60)
Glucose, Bld: 84 mg/dL (ref 70–99)
Potassium: 4.3 mmol/L (ref 3.5–5.1)
Sodium: 140 mmol/L (ref 135–145)
Total Bilirubin: 0.9 mg/dL (ref 0.3–1.2)
Total Protein: 6.7 g/dL (ref 6.5–8.1)

## 2022-06-30 ENCOUNTER — Encounter: Payer: Self-pay | Admitting: Physical Therapy

## 2022-06-30 ENCOUNTER — Ambulatory Visit: Payer: Medicare Other | Admitting: Physical Therapy

## 2022-06-30 DIAGNOSIS — M6281 Muscle weakness (generalized): Secondary | ICD-10-CM

## 2022-06-30 DIAGNOSIS — R102 Pelvic and perineal pain: Secondary | ICD-10-CM

## 2022-06-30 DIAGNOSIS — R252 Cramp and spasm: Secondary | ICD-10-CM | POA: Diagnosis not present

## 2022-06-30 NOTE — Therapy (Signed)
OUTPATIENT PHYSICAL THERAPY TREATMENT NOTE   Patient Name: GWENDOLEN HEWLETT MRN: [id number] DOB:10-08-1945, 76 y.o., female Today's Date: 06/30/2022  PCP: Caren Macadam, MD  REFERRING PROVIDER: Jaquita Folds, MD  Progress Note Reporting Period 04/07/2022 to 06/30/2022  See note below for Objective Data and Assessment of Progress/Goals.      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   Family history of brain cancer    Family history of breast cancer    Family history of colon cancer    Fecal incontinence    05-16-2019 per pt only a little leakage but controllable (was in clinical trial for [facility] without benefit.)   Frequent headaches    History of syncope    pre-syncope  08/ 2015  per pt no issue with this since    Hyperlipidemia    Malignant neoplasm of upper-inner quadrant of right breast in female, estrogen receptor negative [facility]) oncologist-- dr Jana Hakim  dr moody   dx 08/ 2020--  invasive ductal carcinoma,  05-13-2019  s/p right breast lumpectomy    PAF (paroxysmal atrial fibrillation) ([place]) (05-16-2019   pt from [place], recently moved to [place] to be near daughter, has not established a cardiology yet--  previously seen by dr Lowella Dandy cossu (last office note dated 02-28-2018 scanned in epic)---  echo 04-15-2014  ef 55-60%, G1DD mild AR without stenosis, RVSp 39mHg   Personal history of radiation therapy    stopped 09/22/19    Rectal cyst    Thyroid goiter     pt ultrasound in epic 06-04-2014 , nodules, no bx done   Urinary incontinence, mixed    Past Surgical History:  Procedure Laterality Date   ABDOMINAL HYSTERECTOMY  1978   w/  RSO  and APPENDECTOMY   BREAST BIOPSY Right 08/2009   benign   BREAST LUMPECTOMY WITH RADIOACTIVE SEED AND SENTINEL LYMPH NODE BIOPSY Right 05/15/2019   Procedure: RIGHT BREAST LUMPECTOMY WITH RADIOACTIVE SEED AND SENTINEL LYMPH NODE MAPPING;  Surgeon: CErroll Luna MD;  [place] [facility] [place]  Service: General;  Laterality: Right;   BREAST SURGERY  2011   biospy of right breast   CATARACT EXTRACTION W/ INTRAOCULAR LENS  IMPLANT, BILATERAL  2004   COLONOSCOPY  11/21/2019   FLEXIBLE SIGMOIDOSCOPY N/A 05/18/2019   Procedure: FLEXIBLE SIGMOIDOSCOPY,  TRANSANAL EXCISION inclusion cyst;  Surgeon: TLeighton Ruff MD;  [facility]  Service: General;  Laterality: N/A;   gluteus medius repair  2017   LUMBAR LAMINECTOMY  2013   L5 -- S1   PORT-A-CATH REMOVAL Right 09/27/2019   Procedure: PORT REMOVAL;  Surgeon: CErroll Luna MD;  [place] [facility]  Service: General;  Laterality: Right;   PORTACATH PLACEMENT Right 05/15/2019   Procedure: INSERTION PORT-A-CATH WITH ULTRASOUND;  Surgeon:  Erroll Luna, MD;  Location: [place];  Service: General;  Laterality: Right;   ROTATOR CUFF REPAIR Left 2001   Patient Active Problem List   Diagnosis Date Noted   Palpitations 01/20/2021   Anemia, macrocytic 09/25/2019   Paroxysmal atrial fibrillation ([place]) 08/02/2019   Dizziness 08/02/2019   Lower extremity edema 08/02/2019   Genetic testing 05/16/2019   Malignant neoplasm of upper-inner quadrant of right breast in female, estrogen receptor negative ([place]) 05/07/2019   Family history of breast cancer    Family history of colon cancer    Family history of brain cancer    Hyperlipidemia 11/08/2018   Fecal incontinence 11/08/2018   Urinary, incontinence,  stress female 11/08/2018   REFERRING DIAG:  P29.518 (ICD-10-CM) - Levator spasm  R15.9 (ICD-10-CM) - Incontinence of feces, unspecified fecal incontinence type  N39.3 (ICD-10-CM) - SUI (stress urinary incontinence, female      THERAPY DIAG:  Cramp and spasm   Muscle weakness (generalized)   Pelvic pain   Rationale for Evaluation and Treatment Rehabilitation   ONSET DATE: 2021   SUBJECTIVE:                                                                                                                                              SUBJECTIVE STATEMENT: I feel yesterday. I was walking and my foot got caught on the walkway when I was turning around to look at something. No change in tailbone pain.  Am on the second dilator and doing well with it and get all the way in and move it around. No change with the urinary leakage.   PAIN:  Are you having pain? Yes: NPRS scale: 8/10 Pain location: tailbone pain Pain description: deep, intermittent Aggravating factors: walking, sitting and makes it hard to go into standing Relieving factors: nothing   PAIN:  Are you having pain? Yes NPRS scale: 10/10, using the bigger dilator is 3/10 Pain location: Vaginal lower right of introitus   Pain type: burning Pain description: intermittent    Aggravating factors: penile penetration, vaginal exam Relieving factors: no penile penetration   PRECAUTIONS: Other: breast cancer   WEIGHT BEARING RESTRICTIONS No   FALLS:  Has patient fallen in last 6 months? No   LIVING ENVIRONMENT: Lives with: lives with their spouse     OCCUPATION: retired   PLOF: Independent   PATIENT GOALS ability to have intercourse without pain;    PERTINENT HISTORY:  Abdominal hysterectomy 1978; right breast cancer estrogen receptor negative; lumbar laminectomy   BOWEL MOVEMENT Pain with bowel movement: No Type of bowel movement:Type (Bristol Stool Scale)  type 2, Frequency daily, and Strain Yes Fully empty  rectum: Yes: but sometimes has incomplete evacuation.  Leakage: Occurs: after a bowel movement             Consistency with leakage: liquid Pads: Yes: 3-4 Fiber  supplement: Yes: citrucel   URINATION Pain with urination: No Fully empty bladder: Yes: dribbles after urination Stream: Strong Urgency: Yes: if she waits too long Frequency: 2-3 hours Leakage: Coughing, Sneezing, Exercise, and Lifting Pads: Yes: 3-4 liners   INTERCOURSE Pain with intercourse: Initial Penetration due to dryness Ability to have vaginal penetration:  No Climax: no  Marinoff Scale: 3/3 Has tried pelvic physical therapy for about 6 months (at [facility]). Used vaginal dilators and vibrator.   PREGNANCY Vaginal deliveries 1 Tearing No   PROLAPSE Rectocele small and distal       OBJECTIVE:    DIAGNOSTIC FINDINGS:  PVR of 12 ml ; biopsy was negative.    COGNITION:            Overall cognitive status: Within functional limits for tasks assessed                          SENSATION:            Light touch: Appears intact            Proprioception: Appears intact                  POSTURE: No Significant postural limitations               PELVIC ALIGNMENT:   LUMBARAROM/PROMlimited by 50-75% due to pain     LOWER EXTREMITY ROM:   Passive ROM Right eval Left eval Right 06/30/2022 Left  06/30/2022  Hip extension -5 -5    Hip external rotation 50 35 35 35   (Blank rows = not tested)   LOWER EXTREMITY MMT:   MMT Right eval Left eval Right  118/2023 Left  06/30/2022  Hip extension 3/5 3/5 4/5 4/5  Hip abduction 3/5 3/5 4/5 4/5  Hip internal rotation 4/5 4/5 4/5 4/5  Hip external rotation 4/5 4/5 5/5 5/5     PALPATION:      External Perineal Exam dryness notided                             Internal Pelvic Floor stitch noticed at 6 O'Clock, Tenderness located with Qtip inserted into the vagina. Therapist placed her index finger into the vaginal canal 1/4 of an inch but patient had  pain so did not go further.    Patient confirms identification and approves PT to assess internal pelvic floor and treatment Yes   PELVIC MMT:   MMT eval 05/19/2022 05/26/2022 06/30/2022  Vaginal 2/5 3/5 with weak lift 4/5 but takes time to relax 3/5 with good hug of therapist finger and fully relax  Internal Anal Sphincter         External Anal Sphincter         Puborectalis         Diastasis Recti         (Blank rows = not tested)        The anal strength not tested due to her having some bleeding rectally yesterday.  TONE: increased   PROLAPSE: none   TODAY'S TREATMENT 06/23/2022 Manual: Myofascial release:along the hip adductors and other hand on the side of the pubic rami to release the tissue and monitor for pain Internal pelvic floor techniques:No emotional/communication barriers or cognitive limitation. Patient is motivated to learn. Patient understands and agrees with treatment goals and plan. PT explains patient will be examined in standing, sitting, and  lying down to see how their muscles and joints work. When they are ready, they will be asked to remove their underwear so PT can examine their perineum. The patient is also given the option of providing their own chaperone as one is not provided in our facility. The patient also has the right and is explained the right to defer or refuse any part of the evaluation or treatment including the internal exam. With the patient's consent, PT will use one gloved finger to gently assess the muscles of the pelvic floor, seeing how well it contracts and relaxes and if there is muscle symmetry. After, the patient will get dressed and PT and patient will discuss exam findings and plan of care. PT and patient discuss plan of care, schedule, attendance policy and HEP activities.  Using the [facility] Reveleum working with one finger in the vaginal canal and other externally on the urogenital diaphragm releasing the sides of the vaginal canal  and monitoring for pain  One finger in the vaginal canal working on the levator ani and releasing the tension in the muscles    06/16/2022 Manual: Internal pelvic floor techniques:No emotional/communication barriers or cognitive limitation. Patient is motivated to learn. Patient understands and agrees with treatment goals and plan. PT explains patient will be examined in standing, sitting, and lying down to see how their muscles and joints work. When they are ready, they will be asked to remove their underwear so PT can examine their perineum. The patient is also given the option of providing their own chaperone as one is not provided in our facility. The patient also has the right and is explained the right to defer or refuse any part of the evaluation or treatment including the internal exam. With the patient's consent, PT will use one gloved finger to gently assess the muscles of the pelvic floor, seeing how well it contracts and relaxes and if there is muscle symmetry. After, the patient will get dressed and PT and patient will discuss exam findings and plan of care. PT and patient discuss plan of care, schedule, attendance policy and HEP activities.  Therapist index finger in the vaginal canal working on the levator ani, vulvar area and introitus to lengthen the tissue using the [employer], then placed 2 fingers side by side to open up the canal. Manual stretching of the introitus with one finger internal and other external Self-care: Educated patient on using a handle with her dilator and where to get it so it will be easier to use the dilator.      PATIENT EDUCATION:  05/14/2022 Education details: gave patient samples of vaginal moisturizer and cream with lidocaine, educated patient on how to manual massage the vulvar area Person educated: Patient Education method: Explanation, Demonstration, and Handouts Education comprehension: verbalized understanding     HOME EXERCISE  PROGRAM: See above   ASSESSMENT:   CLINICAL IMPRESSION: Patient is a 76 y.o. female who was seen today for physical therapy evaluation and treatment for SUI, fecal leakage, and levator spasm.  Stool leakage has improved by 30%. Patient is able to use the second dilator and move it around and place it all the way in without difficulty. Patient was able to have the therapist work internally without pain and have her finger placed all the way in. Patient is not able to have penile penetration vaginally at this time. Patient has stool leakage after she has strained to have a bowel movement.  Pelvic floor strength is  3/5 with circular hug of therapist finger and able to fully relax.  Patient continues to have coccyx pain.   Patient will benefit from skilled therapy to improve pain and function.      OBJECTIVE IMPAIRMENTS decreased activity tolerance, decreased coordination, decreased endurance, decreased strength, increased fascial restrictions, increased muscle spasms, impaired tone, and pain.    ACTIVITY LIMITATIONS continence and toileting   PARTICIPATION LIMITATIONS: interpersonal relationship   PERSONAL FACTORS Sex, Time since onset of injury/illness/exacerbation, and 3+ comorbidities:    Abdominal hysterectomy 1978; right breast cancer estrogen receptor negative; lumbar laminectomy are also affecting patient's functional outcome.    REHAB POTENTIAL: Excellent   CLINICAL DECISION MAKING: Evolving/moderate complexity   EVALUATION COMPLEXITY: Moderate     GOALS: Goals reviewed with patient? Yes   SHORT TERM GOALS: Target date: 05/05/2022   Patient is independent with pelvic drop and diaphragmatic breathing to relax her pelvic floor.  Baseline: Goal status: Met 05/19/2022   2.  Patient understands how to massage the external perineal area to relax the pelvic floor.  Baseline:  Goal status: Met 05/19/2022   3.  Patient able to have the Q-tip inserted into the vaginal canal without pain.   Baseline:  Goal status: Met 05/19/2022   4.  Patient is using the vaginal vibrator on the tissue to improve blood flow to the tissue and relax the tissue.  Baseline:  Goal status: Met 05/26/2022     LONG TERM GOALS: Target date: 06/30/2022    Patient is independent with using the vaginal dilators and on the largest one with pain </= 1/10.  Baseline:  Goal status: ongoing 06/16/2022   2.  Patient is able to have penile penetration with pain level </= 1/10 and possible with the assistance of the [place].  Baseline: going to start using the 3rd dilator Goal status: ongoing 06/30/2022   3.  Patient reports her stool leakage has improved >/= 75% due to improved strength and coordination of the pelvic floor.  Baseline: happens after she stains and liquid comes out Goal status: ongoing 06/30/2022   4.  Patient reports she is able to hold her urine when she has the urge to urinate and walk to the bathroom.  Baseline: hold urine before she can sit on the commode most of the time Goal status: ongoing 06/30/2022   5.  Patient reports her urinary leakage with coughing, sneezing, exercise and lifting improved >/= 75% due to the improved mobility of the pelvic floor muscles.  Baseline: no change at this time Goal status: ongoing 06/30/2022       PLAN: PT FREQUENCY: 1x/week   PT DURATION: 12 weeks   PLANNED INTERVENTIONS: Therapeutic exercises, Therapeutic activity, Neuromuscular re-education, Patient/Family education, Self Care, Dry Needling, Electrical stimulation, Taping, Biofeedback, and Manual therapy   PLAN FOR NEXT SESSION: manual work to the vagina internally ,use the [place] [employer] reveleum. Earlie Counts, PT 06/30/22 1:18 PM

## 2022-07-20 ENCOUNTER — Inpatient Hospital Stay (HOSPITAL_BASED_OUTPATIENT_CLINIC_OR_DEPARTMENT_OTHER): Payer: Medicare Other | Admitting: Hematology and Oncology

## 2022-07-20 ENCOUNTER — Encounter: Payer: Self-pay | Admitting: Hematology and Oncology

## 2022-07-20 ENCOUNTER — Other Ambulatory Visit: Payer: Self-pay

## 2022-07-20 VITALS — BP 134/74 | HR 66 | Temp 97.8°F | Resp 16 | Ht 62.0 in | Wt 117.5 lb

## 2022-07-20 DIAGNOSIS — C50211 Malignant neoplasm of upper-inner quadrant of right female breast: Secondary | ICD-10-CM | POA: Diagnosis not present

## 2022-07-20 DIAGNOSIS — Z171 Estrogen receptor negative status [ER-]: Secondary | ICD-10-CM

## 2022-07-20 NOTE — Progress Notes (Signed)
Joanne Hansen  Telephone:([phone]) [phone] Fax:(336) [phone]    ID: AKEEMA BRODER DOB: 1946-03-20  MR#: [id number]  KPT#:[id number]  Patient Care Team: Farrel Conners, MD as PCP - General (Family Medicine) Lorretta Harp, MD as PCP - Cardiology (Cardiology) Magrinat, Virgie Dad, MD (Inactive) as Consulting Physician (Oncology) Erroll Luna, MD as Consulting Physician (General Surgery) Kyung Rudd, MD as Consulting Physician (Radiation Oncology) Leighton Ruff, MD as Consulting Physician (General Surgery) Bjorn Loser, MD as Consulting Physician (Urology) Suella Broad, MD as Consulting Physician (Physical Medicine and Rehabilitation) Marylynn Pearson, MD as Consulting Physician (Obstetrics and Gynecology) OTHER MD:  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  She wanted to sit down and have a discussion about this.  She has been looking at all her labs calculating her anion gap which has slowly decreased over time.  For her back pain and coccyx pain she recently had an MRI which did not show any evidence of metastatic disease or myelomatous lesions.  She did not have any falls recently which could explain the worsening pain.  She tells me she has this pain for a number of years although in the past few weeks it has gotten worse.  She did not have any other symptoms to report to me.  She is very active at baseline.  Rest of the pertinent 10 point ROS reviewed and negative.  HISTORY OF CURRENT ILLNESS: From the original intake note:  AIDEE LATIMORE had routine screening mammography on 04/10/2019  showing a possible abnormality in the right breast. She underwent unilateral right diagnostic mammography with tomography and right breast ultrasonography at [facility] on 04/12/2019 showing: Breast Density Category B. Spot compression views of the medial right breast, including tomography confirm an irregular mass spanning approximately 0.9 cm. On physical exam, no mass is palpated in the upper inner quadrant of the right breast. Targeted ultrasound is performed, showing a single irregular mass versus two immediately adjacent small irregular masses, that in total measure 1.1 cm. Individually, the two irregular masses measure 0.8 x 0.4 x 0.5 cm and 0.4 x 0.4 x 0.4 cm, respectively. Mass(es) are at 1 o'clock position 3 cm from the nipple. Ultrasound of the right axilla is negative for lymphadenopathy.  Accordingly on 04/16/2019 she proceeded to biopsy of the right breast area in question. The pathology from this procedure showed ([id number]): invasive ductal carcinoma, grade II. Prognostic indicators significant for: estrogen receptor, 0% negative and progesterone receptor, 0% negative,. Proliferation marker Ki67 at 20%. HER2 negative (1+) by immunohistochemistry.  The patient's subsequent history is as detailed below.   PAST MEDICAL HISTORY: Past Medical History:  Diagnosis Date   Arthritis    Breast cancer ([place])    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   Family history of brain cancer    Family history of breast cancer    Family history of colon cancer    Fecal incontinence    05-16-2019 per pt only a little leakage but controllable (was in clinical trial for [facility] without benefit.)   Frequent headaches    History of syncope    pre-syncope  08/ 2015  per pt no issue with this since    Hyperlipidemia    Malignant neoplasm of upper-inner quadrant of right breast in female, estrogen receptor negative [facility]) oncologist-- dr Jana Hakim  dr moody   dx 08/ 2020--  invasive ductal carcinoma,   05-13-2019  s/p right breast lumpectomy    PAF (paroxysmal atrial fibrillation) ([place]) (05-16-2019   pt from [place], recently moved to [place] to be near daughter, has not established a cardiology yet--  previously seen by dr Lowella Dandy cossu (last office note dated 02-28-2018 scanned in epic)---  echo 04-15-2014  ef 55-60%, G1DD mild AR without stenosis, RVSp 34mHg   Personal history of radiation therapy    stopped 09/22/19    Rectal cyst    Thyroid goiter    pt ultrasound in epic 06-04-2014 , nodules, no bx done   Urinary incontinence, mixed     PAST SURGICAL HISTORY: Past Surgical History:  Procedure Laterality Date   ABDOMINAL HYSTERECTOMY  1978   w/  RSO  and APPENDECTOMY   BREAST BIOPSY Right 08/2009   benign   BREAST LUMPECTOMY WITH RADIOACTIVE SEED AND SENTINEL LYMPH NODE BIOPSY Right 05/15/2019   Procedure: RIGHT BREAST LUMPECTOMY WITH RADIOACTIVE SEED AND SENTINEL LYMPH NODE MAPPING;  Surgeon: CErroll Luna MD;  [place] [facility]  Service: General;  Laterality: Right;   BREAST SURGERY  2011   biospy of right breast   [place]ARACT [place]TRACTION [place] BILATER[place]  2004   COLONOSCOPY  11/21/2019   FLEXIBLE SIGMOIDOSCOPY N/A 05/18/2019   Procedure: FLEXIBLE SIGMOIDOSCOPY,  TRANSANAL EXCISION inclusion cyst;  Surgeon: TLeighton Ruff MD;  [place] [facility]  Service: General;  Laterality: N/A;   gluteus medius repair  2017   LUMBAR LAMINECTOMY  2013   L5 -- S1   PORT-A-CATH REMOVAL Right 09/27/2019   Procedure: PORT REMOVAL;  Surgeon: CErroll Luna MD;  [place] [facility]  Service: General;  Laterality: Right;   PORTACATH PLACEMENT Right 05/15/2019   Procedure: INSERTION PORT-A-CATH WITH ULTRASOUND;  Surgeon: CErroll Luna MD;  [place] [facility]  Service: General;  Laterality: Right;   ROTATOR CUFF REPAIR Left 2001    FAMILY HISTORY: Family History  Problem Relation Age of Onset   COPD  Mother    Cancer Mother 671      colon   Arthritis Mother    Hypertension Mother    Miscarriages / SKoreaMother    Stroke Mother 716  Alzheimer's disease Mother 846  Heart disease Father    Heart attack Father 261  Lung disease Father    Arthritis Sister    Breast cancer Sister 737      Lumpectomy   Diabetes Maternal Grandmother    Breast cancer Niece 388      Louise's Daughter   Cervical cancer Niece 437 The patient's father died at age [id number] a heart attack.  The patient's mother died at age 74 she had a gastrointestinal cancer diagnosed age 31529  The patient has 1 sister with breast cancer and one niece with breast cancer diagnosed at age 74   GYNECOLOGIC HISTORY:  No LMP recorded. Patient has had a hysterectomy. 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  Her husband Germain Osgood (goes by "Ronald Pippins") is a retired Chief Financial Officer.  Their daughter Johnanna Schneiders (currently divorced) lives in [place].  The patient has no grandchildren.  She is not a church attender   ADVANCED DIRECTIVES: The patient's husband is her healthcare power of attorney   HEALTH MAINTENANCE: Social History   Tobacco Use   Smoking status: Never   Smokeless tobacco: Never  Vaping Use   Vaping Use: Never used  Substance Use Topics   Alcohol use: Yes    Comment: Cocktails-3 days per week   Drug use: Never    Types: Amphetamines    Colonoscopy: March 2021/  PAP: Status post hysterectomy  Bone density: Pending   Allergies  Allergen Reactions   Penicillins Swelling   Sulfa Antibiotics Nausea And Vomiting     OBJECTIVE: white woman who appears well  Vitals:   07/20/22 1128  BP: 134/74  Pulse: 66  Resp: 16  Temp: 97.8 F (36.6 C)  SpO2: 100%     Wt Readings from Last 3 Encounters:  07/20/22 117 lb 8 oz (53.3 kg)  05/11/22 117 lb 11.2 oz (53.4 kg)  05/05/22 117 lb 9.6 oz (53.3 kg)   Body mass index is 21.49 kg/m.    ECOG FS:1 - Symptomatic but completely ambulatory   Sclerae unicteric, EOMs intact  Physical exam deferred in lieu of counseling.   LAB RESULTS:  CMP     Component Value Date/Time   NA 140 06/25/2022 1107   K 4.3 06/25/2022 1107   CL 105 06/25/2022 1107   CO2 29 06/25/2022 1107   GLUCOSE 84 06/25/2022 1107   BUN 16 06/25/2022 1107   CREATININE 0.70 06/25/2022 1107   CALCIUM 9.4 06/25/2022 1107   PROT 6.7 06/25/2022 1107   PROT 6.6 02/06/2021 0845   ALBUMIN 4.2 06/25/2022 1107   ALBUMIN 4.7 02/06/2021 0845   AST 16 06/25/2022 1107   ALT 12 06/25/2022 1107   ALKPHOS 55 06/25/2022 1107   BILITOT 0.9 06/25/2022 1107   GFRNONAA >60 06/25/2022 1107   GFRAA >60 05/21/2020 1413   GFRAA >60 05/07/2019 1547    No results found for: "TOTALPROTELP", "ALBUMINELP", "A1GS", "A2GS", "BETS", "BETA2SER", "GAMS", "MSPIKE", "SPEI"  No results found for: "KPAFRELGTCHN", "LAMBDASER", "KAPLAMBRATIO"  Lab Results  Component Value Date   WBC 4.9 06/25/2022   NEUTROABS 2.5 06/25/2022   HGB 13.6 06/25/2022   HCT 40.4 06/25/2022   MCV 99.5 06/25/2022   PLT 199 06/25/2022    No results found for: "LABCA2"  No components found for: "HDQQIW979"  No results for input(s): "INR" in the last 168 hours.  No results found for: "LABCA2"  No results found for: "GXQ119"  No results found for: "CAN125"  No results found for: "CAN153"  No results found for: "CA2729"  No components found for: "HGQUANT"  No results found for: "CEA1", "CEA" / No results found for: "CEA1", "CEA"   No results found for: "AFPTUMOR"  No results found for: "CHROMOGRNA"  No results found for: "HGBA", "HGBA2QUANT", "HGBFQUANT", "HGBSQUAN" (Hemoglobinopathy evaluation)   No results found for: "LDH"  Lab Results  Component  Value Date   IRON 117 12/20/2019   TIBC 298 12/20/2019   IRONPCTSAT 39 12/20/2019   (Iron and TIBC)  Lab Results  Component Value Date   FERRITIN 92 12/20/2019    Urinalysis    Component Value Date/Time   BILIRUBINUR Negative 02/27/2020 1123   PROTEINUR Negative 02/27/2020 1123   UROBILINOGEN 0.2 02/27/2020 1123  NITRITE Negative 02/27/2020 1123   LEUKOCYTESUR Trace (A) 02/27/2020 1123    STUDIES:  No results found.   ELIGIBLE FOR AVAILABLE RESEARCH PROTOCOL: NO   ASSESSMENT: 76 y.o. [place], [place] woman status post right breast upper quadrant biopsy for an mT1b (or single T1c)  invasive ductal carcinoma, grade 2, triple negative, with an MIB-1-1 of 20%.  (1) Genetic testing on 05/07/2019 through the [facility] Breast Cancer STAT Panel + Common Hereditary cancer panel found no pathogenic mutations. The STAT Breast cancer panel offered by [facility] includes sequencing and rearrangement analysis for the following 9 genes:  ATM, BRCA1, BRCA2, CDH1, CHEK2, PALB2, PTEN, STK11 and TP53.  The Common Hereditary Cancers Panel offered by [facility] includes sequencing and/or deletion duplication testing of the following 47 genes: APC, ATM, AXIN2, BARD1, BMPR1A, BRCA1, BRCA2, BRIP1, CDH1, CDKN2A (p14ARF), CDKN2A (p16INK4a), CKD4, CHEK2, CTNNA1, DICER1, EPCAM (Deletion/duplication testing only), GREM1 (promoter region deletion/duplication testing only), KIT, MEN1, MLH1, MSH2, MSH3, MSH6, MUTYH, NBN, NF1, NHTL1, PALB2, PDGFRA, PMS2, POLD1, POLE, PTEN, RAD50, RAD51C, RAD51D, SDHB, SDHC, SDHD, SMAD4, SMARCA4. STK11, TP53, TSC1, TSC2, and VHL.  The following genes were evaluated for sequence changes only: SDHA and HOXB13 c.251G>A variant only.  (2) s/p right lumpectomy and sentinel lymph node sampling 05/15/2019 for a pT1b pN0 stage IB invasive ductal carcinoma, grade 3, with negative margins.  (a) a total of 2 sentinel lymph nodes were removed  (3) adjuvant chemotherapy consisting of cyclophosphamide  and docetaxel every 21 days x 4, started 05/29/2019, completed 07/31/2019  (4) adjuvant radiation 08/27/2019 - 09/21/2019 The right breast was treated to 42.56 Gy in 16 fractions followed by an 8 Gy boost to the surgical cavity.  (5) opted against prophylactic antiestrogens  (a) bone density 04/10/2020 shows a T score of -1.9  (6) severe vaginal atrophy with dyspareunia  (a) Estring prescribed 05/20/2021- not affordable    PLAN:  We have discussed that we do not have any evidence of myeloma so far from the labs.  Anion gap is still within normal limits based on her lab value.  We have discussed that myeloma is defined by monoclonal protein along with the crab features defined with hypercalcemia, renal failure, anemia and bone lesions.  Her most recent MRI of the back had no evidence of metastatic disease or myelomatous lesions.  She has no anemia, hypercalcemia, renal failure, elevated total protein.  At this time I do not have enough grounds to suspect metastatic cancer and myeloma based on just slowly decreasing anion gap which is fortunately still within normal limits.  Although I cannot guarantee that the breast cancer will never return, at this time I do not see enough evidence for it.  I have encouraged her to talk to her PCP about her coccyx pain as well.  She will return to clinic in September 2024 or as needed.  Total time spent: 30 minutes  *Total Encounter Time as defined by the Centers for Medicare and Medicaid Services includes, in addition to the face-to-face time of a patient visit (documented in the note above) non-face-to-face time: obtaining and reviewing outside history, ordering and reviewing medications, tests or procedures, care coordination (communications with other health care professionals or caregivers) and documentation in the medical record.

## 2022-07-21 ENCOUNTER — Encounter: Payer: Self-pay | Admitting: Physical Therapy

## 2022-07-21 ENCOUNTER — Ambulatory Visit: Payer: Medicare Other | Admitting: Physical Therapy

## 2022-07-21 DIAGNOSIS — R252 Cramp and spasm: Secondary | ICD-10-CM | POA: Diagnosis not present

## 2022-07-21 DIAGNOSIS — R102 Pelvic and perineal pain: Secondary | ICD-10-CM

## 2022-07-21 DIAGNOSIS — M6281 Muscle weakness (generalized): Secondary | ICD-10-CM

## 2022-07-21 NOTE — Therapy (Signed)
OUTPATIENT PHYSICAL THERAPY TREATMENT NOTE   Patient Name: Joanne Hansen MRN: [id number] DOB:1945-12-27, 76 y.o., female Today's Date: 07/21/2022  PCP: Caren Macadam, MD  REFERRING PROVIDER: Jaquita Folds, MD    END OF SESSION:   PT End of Session - 07/21/22 1606     Visit Number 11    Date for PT Re-Evaluation 09/22/22    Authorization Type Medicare    Authorization - Visit Number 11    Authorization - Number of Visits 20    PT Start Time 9093    PT Stop Time 1121    PT Time Calculation (min) 44 min    Activity Tolerance Patient tolerated treatment well    Behavior During Therapy WFL for tasks assessed/performed             Past Medical History:  Diagnosis Date   Arthritis    Breast cancer ([place])    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   Family history of brain cancer    Family history of breast cancer    Family history of colon cancer    Fecal incontinence    05-16-2019 per pt only a little leakage but controllable (was in clinical trial for [facility] without benefit.)   Frequent headaches    History of syncope    pre-syncope  08/ 2015  per pt no issue with this since    Hyperlipidemia    Malignant neoplasm of upper-inner quadrant of right breast in female, estrogen receptor negative [facility]) oncologist-- dr Jana Hakim  dr moody   dx 08/ 2020--  invasive ductal carcinoma,  05-13-2019  s/p right breast lumpectomy    PAF (paroxysmal atrial fibrillation) ([place]) (05-16-2019   pt from [place], recently moved to [place] to be near daughter, has not established a cardiology yet--  previously seen by dr Lowella Dandy cossu (last office note dated 02-28-2018 scanned in epic)---  echo 04-15-2014  ef 55-60%, G1DD mild AR without stenosis, RVSp 97mHg   Personal history of radiation therapy    stopped 09/22/19    Rectal cyst    Thyroid goiter    pt ultrasound in epic 06-04-2014 , nodules, no bx done   Urinary incontinence, mixed    Past Surgical History:  Procedure  Laterality Date   ABDOMINAL HYSTERECTOMY  1978   w/  RSO  and APPENDECTOMY   BREAST BIOPSY Right 08/2009   benign   BREAST LUMPECTOMY WITH RADIOACTIVE SEED AND SENTINEL LYMPH NODE BIOPSY Right 05/15/2019   Procedure: RIGHT BREAST LUMPECTOMY WITH RADIOACTIVE SEED AND SENTINEL LYMPH NODE MAPPING;  Surgeon: CErroll Luna MD;  [place] [facility]  Service: General;  Laterality: Right;   BREAST SURGERY  2011   biospy of right breast   CATARACT EXTRACTION W/ INTRAOCULAR LENS  IMPLANT, BILATERAL  2004   COLONOSCOPY  11/21/2019   FLEXIBLE SIGMOIDOSCOPY N/A 05/18/2019   Procedure: FLEXIBLE SIGMOIDOSCOPY,  TRANSANAL EXCISION inclusion cyst;  Surgeon: TLeighton Ruff MD;  [place] [facility]  Service: General;  Laterality: N/A;   gluteus medius repair  2017   LUMBAR LAMINECTOMY  2013   L5 -- S1   PORT-A-CATH REMOVAL Right 09/27/2019   Procedure: PORT REMOVAL;  Surgeon: CErroll Luna MD;  [place] [facility]  Service: General;  Laterality: Right;   PORTACATH PLACEMENT Right 05/15/2019   Procedure: INSERTION PORT-A-CATH WITH ULTRASOUND;  Surgeon: CErroll Luna MD;  [place] [facility]  Service: General;  Laterality: Right;   ROTATOR CUFF REPAIR Left  2001   Patient Active Problem List   Diagnosis Date Noted   Palpitations 01/20/2021   Anemia, macrocytic 09/25/2019   Paroxysmal atrial fibrillation ([place]) 08/02/2019   Dizziness 08/02/2019   Lower extremity edema 08/02/2019   Genetic testing 05/16/2019   Malignant neoplasm of upper-inner quadrant of right breast in female, estrogen receptor negative ([place]) 05/07/2019   Family history of breast cancer    Family history of colon cancer    Family history of brain cancer    Hyperlipidemia 11/08/2018   Fecal incontinence 11/08/2018   Urinary, incontinence, stress female 11/08/2018   REFERRING DIAG:  B71.696 (ICD-10-CM) - Levator spasm  R15.9 (ICD-10-CM) - Incontinence of feces,  unspecified fecal incontinence type  N39.3 (ICD-10-CM) - SUI (stress urinary incontinence, female      THERAPY DIAG:  Cramp and spasm   Muscle weakness (generalized)   Pelvic pain   Rationale for Evaluation and Treatment Rehabilitation   ONSET DATE: 2021   SUBJECTIVE:                                                                                                                                              SUBJECTIVE STATEMENT: I am on the third dilator out of 4.  The estrogen cream on the spot vaginally is helping.    PAIN:  Are you having pain? Yes: NPRS scale: 8/10 Pain location: tailbone pain Pain description: deep, intermittent Aggravating factors: walking, sitting and makes it hard to go into standing Relieving factors: nothing   PAIN:  Are you having pain? Yes NPRS scale: 10/10, using the bigger dilator is 3/10 Pain location: Vaginal lower right of introitus   Pain type: burning Pain description: intermittent    Aggravating factors: penile penetration, vaginal exam Relieving factors: no penile penetration   PRECAUTIONS: Other: breast cancer   WEIGHT BEARING RESTRICTIONS No   FALLS:  Has patient fallen in last 6 months? No   LIVING ENVIRONMENT: Lives with: lives with their spouse     OCCUPATION: retired   PLOF: Independent   PATIENT GOALS ability to have intercourse without pain;    PERTINENT HISTORY:  Abdominal hysterectomy 1978; right breast cancer estrogen receptor negative; lumbar laminectomy   BOWEL MOVEMENT Pain with bowel movement: No Type of bowel movement:Type (Bristol Stool Scale)  type 2, Frequency daily, and Strain Yes Fully empty rectum: Yes: but sometimes has incomplete evacuation.  Leakage: Occurs: after a bowel movement             Consistency with leakage: liquid Pads: Yes: 3-4 Fiber supplement: Yes: citrucel   URINATION Pain with urination: No Fully empty bladder: Yes: dribbles after urination Stream: Strong Urgency: Yes:  if she waits too long Frequency: 2-3 hours Leakage: Coughing, Sneezing, Exercise, and Lifting Pads: Yes: 3-4 liners   INTERCOURSE Pain with intercourse: Initial Penetration due to dryness Ability to have vaginal penetration:  No Climax: no  Marinoff Scale: 3/3 Has tried pelvic physical therapy for about 6 months (at [facility]). Used vaginal dilators and vibrator.   PREGNANCY Vaginal deliveries 1 Tearing No   PROLAPSE Rectocele small and distal      OBJECTIVE: (objective measures completed at initial evaluation unless otherwise dated)   (Copy Eval's Objective through Plan section here)   DIAGNOSTIC FINDINGS:  PVR of 12 ml ; biopsy was negative.    COGNITION:            Overall cognitive status: Within functional limits for tasks assessed                          SENSATION:            Light touch: Appears intact            Proprioception: Appears intact                  POSTURE: No Significant postural limitations               PELVIC ALIGNMENT:   LUMBARAROM/PROMlimited by 50-75% due to pain     LOWER EXTREMITY ROM:   Passive ROM Right eval Left eval Right 06/30/2022 Left  06/30/2022  Hip extension -5 -5      Hip external rotation 50 35 35 35   (Blank rows = not tested)   LOWER EXTREMITY MMT:   MMT Right eval Left eval Right  118/2023 Left  06/30/2022  Hip extension 3/5 3/5 4/5 4/5  Hip abduction 3/5 3/5 4/5 4/5  Hip internal rotation 4/5 4/5 4/5 4/5  Hip external rotation 4/5 4/5 5/5 5/5     PALPATION:      External Perineal Exam dryness notided                             Internal Pelvic Floor stitch noticed at 6 O'Clock, Tenderness located with Qtip inserted into the vagina. Therapist placed her index finger into the vaginal canal 1/4 of an inch but patient had pain so did not go further.    Patient confirms identification and approves PT to assess internal pelvic floor and treatment Yes   PELVIC MMT:   MMT eval 05/19/2022 05/26/2022  06/30/2022  Vaginal 2/5 3/5 with weak lift 4/5 but takes time to relax 3/5 with good hug of therapist finger and fully relax  (Blank rows = not tested)        The anal strength not tested due to her having some bleeding rectally yesterday.  TONE: increased   PROLAPSE: none   TODAY'S TREATMENT 07/21/2022 Manual: Internal pelvic floor techniques:No emotional/communication barriers or cognitive limitation. Patient is motivated to learn. Patient understands and agrees with treatment goals and plan. PT explains patient will be examined in standing, sitting, and lying down to see how their muscles and joints work. When they are ready, they will be asked to remove their underwear so PT can examine their perineum. The patient is also given the option of providing their own chaperone as one is not provided in our facility. The patient also has the right and is explained the right to defer or refuse any part of the evaluation or treatment including the internal exam. With the patient's consent, PT will use one gloved finger to gently assess the muscles of the pelvic floor, seeing how well it contracts and relaxes and if  there is muscle symmetry. After, the patient will get dressed and PT and patient will discuss exam findings and plan of care. PT and patient discuss plan of care, schedule, attendance policy and HEP activities.  Fascial release along the pubic rami, along the urogenital diaphragm, along the ischiocavernosus, and superior transverse while monitoring for pain and elongation of tissue Exercises: Strengthening: Nustep level 4 for 8 minutes while assessing patient    06/23/2022 Manual: Myofascial release:along the hip adductors and other hand on the side of the pubic rami to release the tissue and monitor for pain Internal pelvic floor techniques:No emotional/communication barriers or cognitive limitation. Patient is motivated to learn. Patient understands and agrees with treatment goals and  plan. PT explains patient will be examined in standing, sitting, and lying down to see how their muscles and joints work. When they are ready, they will be asked to remove their underwear so PT can examine their perineum. The patient is also given the option of providing their own chaperone as one is not provided in our facility. The patient also has the right and is explained the right to defer or refuse any part of the evaluation or treatment including the internal exam. With the patient's consent, PT will use one gloved finger to gently assess the muscles of the pelvic floor, seeing how well it contracts and relaxes and if there is muscle symmetry. After, the patient will get dressed and PT and patient will discuss exam findings and plan of care. PT and patient discuss plan of care, schedule, attendance policy and HEP activities.  Using the [facility] working with one finger in the vaginal canal and other externally on the urogenital diaphragm releasing the sides of the vaginal canal and monitoring for pain  One finger in the vaginal canal working on the levator ani and releasing the tension in the muscles    06/16/2022 Manual: Internal pelvic floor techniques:No emotional/communication barriers or cognitive limitation. Patient is motivated to learn. Patient understands and agrees with treatment goals and plan. PT explains patient will be examined in standing, sitting, and lying down to see how their muscles and joints work. When they are ready, they will be asked to remove their underwear so PT can examine their perineum. The patient is also given the option of providing their own chaperone as one is not provided in our facility. The patient also has the right and is explained the right to defer or refuse any part of the evaluation or treatment including the internal exam. With the patient's consent, PT will use one gloved finger to gently assess the muscles of the pelvic floor, seeing how  well it contracts and relaxes and if there is muscle symmetry. After, the patient will get dressed and PT and patient will discuss exam findings and plan of care. PT and patient discuss plan of care, schedule, attendance policy and HEP activities.  Therapist index finger in the vaginal canal working on the levator ani, vulvar area and introitus to lengthen the tissue using the [employer], then placed 2 fingers side by side to open up the canal. Manual stretching of the introitus with one finger internal and other external Self-care: Educated patient on using a handle with her dilator and where to get it so it will be easier to use the dilator.       PATIENT EDUCATION:  05/14/2022 Education details: gave patient samples of vaginal moisturizer and cream with lidocaine, educated patient on how to manual massage  the vulvar area Person educated: Patient Education method: Explanation, Demonstration, and Handouts Education comprehension: verbalized understanding     HOME EXERCISE PROGRAM: See above   ASSESSMENT:   CLINICAL IMPRESSION: Patient is a 76 y.o. female who was seen today for physical therapy evaluation and treatment for SUI, fecal leakage, and levator spasm.  Stool leakage has improved by 30%. Leakage is mostly water. Patient is able to use the third out 4  dilator and move it around and place it all the way in without difficulty.  Patient is not able to have penile penetration vaginally at this time.  Pelvic floor strength is 3/5 with circular hug of therapist finger and able to fully relax.  Patient continues to have coccyx pain.   Patient will benefit from skilled therapy to improve pain and function.      OBJECTIVE IMPAIRMENTS decreased activity tolerance, decreased coordination, decreased endurance, decreased strength, increased fascial restrictions, increased muscle spasms, impaired tone, and pain.    ACTIVITY LIMITATIONS continence and toileting   PARTICIPATION  LIMITATIONS: interpersonal relationship   PERSONAL FACTORS Sex, Time since onset of injury/illness/exacerbation, and 3+ comorbidities:    Abdominal hysterectomy 1978; right breast cancer estrogen receptor negative; lumbar laminectomy are also affecting patient's functional outcome.    REHAB POTENTIAL: Excellent   CLINICAL DECISION MAKING: Evolving/moderate complexity   EVALUATION COMPLEXITY: Moderate     GOALS: Goals reviewed with patient? Yes   SHORT TERM GOALS: Target date: 05/05/2022   Patient is independent with pelvic drop and diaphragmatic breathing to relax her pelvic floor.  Baseline: Goal status: Met 05/19/2022   2.  Patient understands how to massage the external perineal area to relax the pelvic floor.  Baseline:  Goal status: Met 05/19/2022   3.  Patient able to have the Q-tip inserted into the vaginal canal without pain.  Baseline:  Goal status: Met 05/19/2022   4.  Patient is using the vaginal vibrator on the tissue to improve blood flow to the tissue and relax the tissue.  Baseline:  Goal status: Met 05/26/2022     LONG TERM GOALS: Target date: 06/30/2022    Patient is independent with using the vaginal dilators and on the largest one with pain </= 1/10.  Baseline:  Goal status: ongoing 06/16/2022   2.  Patient is able to have penile penetration with pain level </= 1/10 and possible with the assistance of the [place].  Baseline: going to start using the 3rd dilator Goal status: ongoing 06/30/2022   3.  Patient reports her stool leakage has improved >/= 75% due to improved strength and coordination of the pelvic floor.  Baseline: happens after she stains and liquid comes out Goal status: ongoing 06/30/2022   4.  Patient reports she is able to hold her urine when she has the urge to urinate and walk to the bathroom.  Baseline: hold urine before she can sit on the commode most of the time Goal status: ongoing 06/30/2022   5.  Patient reports her urinary leakage  with coughing, sneezing, exercise and lifting improved >/= 75% due to the improved mobility of the pelvic floor muscles.  Baseline: no change at this time Goal status: ongoing 06/30/2022       PLAN: PT FREQUENCY: 1x/week   PT DURATION: 12 weeks   PLANNED INTERVENTIONS: Therapeutic exercises, Therapeutic activity, Neuromuscular re-education, Patient/Family education, Self Care, Dry Needling, Electrical stimulation, Taping, Biofeedback, and Manual therapy   PLAN FOR NEXT SESSION: manual work to the vagina internally ,use the  [place], possible discharge  Earlie Counts, PT 07/21/22 4:55 PM

## 2022-08-02 ENCOUNTER — Encounter: Payer: Self-pay | Admitting: Physical Therapy

## 2022-08-02 ENCOUNTER — Ambulatory Visit: Payer: Medicare Other | Attending: Obstetrics and Gynecology | Admitting: Physical Therapy

## 2022-08-02 DIAGNOSIS — R252 Cramp and spasm: Secondary | ICD-10-CM | POA: Insufficient documentation

## 2022-08-02 DIAGNOSIS — M6281 Muscle weakness (generalized): Secondary | ICD-10-CM | POA: Diagnosis present

## 2022-08-02 DIAGNOSIS — R102 Pelvic and perineal pain: Secondary | ICD-10-CM | POA: Diagnosis present

## 2022-08-02 NOTE — Therapy (Signed)
OUTPATIENT PHYSICAL THERAPY TREATMENT NOTE   Patient Name: Joanne Hansen MRN: [id number] DOB:1945-12-05, 76 y.o., female Today's Date: 08/02/2022  PCP: Caren Macadam, MD  REFERRING PROVIDER: Jaquita Folds, MD     END OF SESSION:   PT End of Session - 08/02/22 1058     Visit Number 12    Date for PT Re-Evaluation 09/22/22    Authorization Type Medicare    Authorization - Visit Number 12    Authorization - Number of Visits 20    PT Start Time 1100    PT Stop Time 1140    PT Time Calculation (min) 40 min    Activity Tolerance Patient tolerated treatment well    Behavior During Therapy [facility] for tasks assessed/performed             Past Medical History:  Diagnosis Date   Arthritis    Breast cancer ([place])    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   Family history of brain cancer    Family history of breast cancer    Family history of colon cancer    Fecal incontinence    05-16-2019 per pt only a little leakage but controllable (was in clinical trial for [facility] without benefit.)   Frequent headaches    History of syncope    pre-syncope  08/ 2015  per pt no issue with this since    Hyperlipidemia    Malignant neoplasm of upper-inner quadrant of right breast in female, estrogen receptor negative [facility]) oncologist-- dr Jana Hakim  dr moody   dx 08/ 2020--  invasive ductal carcinoma,  05-13-2019  s/p right breast lumpectomy    PAF (paroxysmal atrial fibrillation) ([place]) (05-16-2019   pt from [place], recently moved to [place] to be near daughter, has not established a cardiology yet--  previously seen by dr Lowella Dandy cossu (last office note dated 02-28-2018 scanned in epic)---  echo 04-15-2014  ef 55-60%, G1DD mild AR without stenosis, RVSp 45mHg   Personal history of radiation therapy    stopped 09/22/19    Rectal cyst    Thyroid goiter    pt ultrasound in epic 06-04-2014 , nodules, no bx done   Urinary incontinence, mixed    Past Surgical History:  Procedure  Laterality Date   ABDOMINAL HYSTERECTOMY  1978   w/  RSO  and APPENDECTOMY   BREAST BIOPSY Right 08/2009   benign   BREAST LUMPECTOMY WITH RADIOACTIVE SEED AND SENTINEL LYMPH NODE BIOPSY Right 05/15/2019   Procedure: RIGHT BREAST LUMPECTOMY WITH RADIOACTIVE SEED AND SENTINEL LYMPH NODE MAPPING;  Surgeon: CErroll Luna MD;  [facility] [facility]  Service: General;  Laterality: Right;   BREAST SURGERY  2011   biospy of right breast   CATARACT EXTRACTION W/ INTRAOCULAR LENS  IMPLANT, BILATERAL  2004   COLONOSCOPY  11/21/2019   FLEXIBLE SIGMOIDOSCOPY N/A 05/18/2019   Procedure: FLEXIBLE SIGMOIDOSCOPY,  TRANSANAL EXCISION inclusion cyst;  Surgeon: TLeighton Ruff MD;  [place] [facility]  Service: General;  Laterality: N/A;   gluteus medius repair  2017   LUMBAR LAMINECTOMY  2013   L5 -- S1   PORT-A-CATH REMOVAL Right 09/27/2019   Procedure: PORT REMOVAL;  Surgeon: CErroll Luna MD;  [place] [facility]  Service: General;  Laterality: Right;   PORTACATH PLACEMENT Right 05/15/2019   Procedure: INSERTION PORT-A-CATH WITH ULTRASOUND;  Surgeon: CErroll Luna MD;  [place] [facility]  Service: General;  Laterality: Right;   ROTATOR CUFF REPAIR  Left 2001   Patient Active Problem List   Diagnosis Date Noted   Palpitations 01/20/2021   Anemia, macrocytic 09/25/2019   Paroxysmal atrial fibrillation ([place]) 08/02/2019   Dizziness 08/02/2019   Lower extremity edema 08/02/2019   Genetic testing 05/16/2019   Malignant neoplasm of upper-inner quadrant of right breast in female, estrogen receptor negative ([place]) 05/07/2019   Family history of breast cancer    Family history of colon cancer    Family history of brain cancer    Hyperlipidemia 11/08/2018   Fecal incontinence 11/08/2018   Urinary, incontinence, stress female 11/08/2018   REFERRING DIAG:  H37.169 (ICD-10-CM) - Levator spasm  R15.9 (ICD-10-CM) - Incontinence of feces,  unspecified fecal incontinence type  N39.3 (ICD-10-CM) - SUI (stress urinary incontinence, female      THERAPY DIAG:  Cramp and spasm   Muscle weakness (generalized)   Pelvic pain   Rationale for Evaluation and Treatment Rehabilitation   ONSET DATE: 2021   SUBJECTIVE:                                                                                                                                              SUBJECTIVE STATEMENT: I am doing good. Patient is on the largest dilator. The dilator is a little discomfort initially. I can place the dilator almost fully into the vaginal canal. Patient has not had penile penetration at this time.    PAIN:  Are you having pain? Yes: NPRS scale: 8/10 Pain location: tailbone pain Pain description: deep, intermittent Aggravating factors: walking, sitting and makes it hard to go into standing Relieving factors: nothing   PAIN:  Are you having pain? Yes NPRS scale: 10/10, using the bigger dilator is 2/10 Pain location: Vaginal lower right of introitus   Pain type: burning Pain description: intermittent    Aggravating factors: penile penetration, vaginal exam Relieving factors: no penile penetration   PRECAUTIONS: Other: breast cancer   WEIGHT BEARING RESTRICTIONS No   FALLS:  Has patient fallen in last 6 months? No   LIVING ENVIRONMENT: Lives with: lives with their spouse     OCCUPATION: retired   PLOF: Independent   PATIENT GOALS ability to have intercourse without pain;    PERTINENT HISTORY:  Abdominal hysterectomy 1978; right breast cancer estrogen receptor negative; lumbar laminectomy   BOWEL MOVEMENT Pain with bowel movement: No Type of bowel movement:Type (Bristol Stool Scale)  type 2, Frequency daily, and Strain Yes Fully empty rectum: Yes: but sometimes has incomplete evacuation.  Leakage: Occurs: after a bowel movement             Consistency with leakage: liquid Pads: Yes: 3-4 Fiber supplement: Yes:  citrucel   URINATION Pain with urination: No Fully empty bladder: Yes: dribbles after urination Stream: Strong Urgency: Yes: if she waits too long Frequency: 2-3 hours Leakage: Coughing, Sneezing, Exercise, and Lifting Pads: Yes:  3-4 liners   INTERCOURSE Pain with intercourse: Initial Penetration due to dryness Ability to have vaginal penetration:  No Climax: no  Marinoff Scale: 3/3 Has tried pelvic physical therapy for about 6 months (at [facility]). Used vaginal dilators and vibrator.   PREGNANCY Vaginal deliveries 1 Tearing No   PROLAPSE Rectocele small and distal      OBJECTIVE: (objective measures completed at initial evaluation unless otherwise dated)       DIAGNOSTIC FINDINGS:  PVR of 12 ml ; biopsy was negative.    COGNITION:            Overall cognitive status: Within functional limits for tasks assessed                          SENSATION:            Light touch: Appears intact            Proprioception: Appears intact                  POSTURE: No Significant postural limitations               PELVIC ALIGNMENT:   LUMBARAROM/PROMlimited by 50-75% due to pain     LOWER EXTREMITY ROM:   Passive ROM Right eval Left eval Right 06/30/2022 Left  06/30/2022 Right 08/02/22 Left  08/02/22  Hip external rotation 50 35 35 35 55 55   (Blank rows = not tested)   LOWER EXTREMITY MMT:   MMT Right eval Left eval Right  06/30/2022 Left  06/30/2022 Right  08/02/22 Left 08/02/22  Hip extension 3/5 3/5 4/5 4/5 4+/5 4+/5  Hip abduction 3/5 3/5 4/5 4/5 4+/5 4+/5  Hip internal rotation 4/5 4/5 4/5 4/5 4+/5 4+/5  Hip external rotation 4/5 4/5 5/5 5/5 4+/5 4+/5     PALPATION:      External Perineal Exam dryness notided                             Internal Pelvic Floor stitch noticed at 6 O'Clock, Tenderness located with Qtip inserted into the vagina. Therapist placed her index finger into the vaginal canal 1/4 of an inch but patient had pain so did not go  further.    Patient confirms identification and approves PT to assess internal pelvic floor and treatment Yes   PELVIC MMT:   MMT eval 05/19/2022 05/26/2022 06/30/2022  Vaginal 2/5 3/5 with weak lift 4/5 but takes time to relax 3/5 with good hug of therapist finger and fully relax  (Blank rows = not tested)        The anal strength not tested due to her having some bleeding rectally yesterday.  TONE: increased   PROLAPSE: none   TODAY'S TREATMENT 08/02/2022 Manual: Internal pelvic floor techniques:No emotional/communication barriers or cognitive limitation. Patient is motivated to learn. Patient understands and agrees with treatment goals and plan. PT explains patient will be examined in standing, sitting, and lying down to see how their muscles and joints work. When they are ready, they will be asked to remove their underwear so PT can examine their perineum. The patient is also given the option of providing their own chaperone as one is not provided in our facility. The patient also has the right and is explained the right to defer or refuse any part of the evaluation or treatment including the internal exam. With the patient's  consent, PT will use one gloved finger to gently assess the muscles of the pelvic floor, seeing how well it contracts and relaxes and if there is muscle symmetry. After, the patient will get dressed and PT and patient will discuss exam findings and plan of care. PT and patient discuss plan of care, schedule, attendance policy and HEP activities.  Going through the vaginal canal working on the tissue to expand the canal and introitus for her to be able to have penile penetration. Monitored for pain. Was able to place two finger in the canal together.  Educated patient on using the dilator prior to penile penetration, using lubricant and her be in control to allow the penetration slowly.   07/21/2022 Manual: Internal pelvic floor techniques:No emotional/communication  barriers or cognitive limitation. Patient is motivated to learn. Patient understands and agrees with treatment goals and plan. PT explains patient will be examined in standing, sitting, and lying down to see how their muscles and joints work. When they are ready, they will be asked to remove their underwear so PT can examine their perineum. The patient is also given the option of providing their own chaperone as one is not provided in our facility. The patient also has the right and is explained the right to defer or refuse any part of the evaluation or treatment including the internal exam. With the patient's consent, PT will use one gloved finger to gently assess the muscles of the pelvic floor, seeing how well it contracts and relaxes and if there is muscle symmetry. After, the patient will get dressed and PT and patient will discuss exam findings and plan of care. PT and patient discuss plan of care, schedule, attendance policy and HEP activities.  Fascial release along the pubic rami, along the urogenital diaphragm, along the ischiocavernosus, and superior transverse while monitoring for pain and elongation of tissue Exercises: Strengthening: Nustep level 4 for 8 minutes while assessing patient    06/23/2022 Manual: Myofascial release:along the hip adductors and other hand on the side of the pubic rami to release the tissue and monitor for pain Internal pelvic floor techniques:No emotional/communication barriers or cognitive limitation. Patient is motivated to learn. Patient understands and agrees with treatment goals and plan. PT explains patient will be examined in standing, sitting, and lying down to see how their muscles and joints work. When they are ready, they will be asked to remove their underwear so PT can examine their perineum. The patient is also given the option of providing their own chaperone as one is not provided in our facility. The patient also has the right and is explained the  right to defer or refuse any part of the evaluation or treatment including the internal exam. With the patient's consent, PT will use one gloved finger to gently assess the muscles of the pelvic floor, seeing how well it contracts and relaxes and if there is muscle symmetry. After, the patient will get dressed and PT and patient will discuss exam findings and plan of care. PT and patient discuss plan of care, schedule, attendance policy and HEP activities.  Using the [facility] working with one finger in the vaginal canal and other externally on the urogenital diaphragm releasing the sides of the vaginal canal and monitoring for pain  One finger in the vaginal canal working on the levator ani and releasing the tension in the muscles   PATIENT EDUCATION:  05/14/2022 Education details: gave patient samples of vaginal moisturizer and cream with lidocaine,  educated patient on how to manual massage the vulvar area Person educated: Patient Education method: Explanation, Demonstration, and Handouts Education comprehension: verbalized understanding     HOME EXERCISE PROGRAM: See above   ASSESSMENT:   CLINICAL IMPRESSION: Patient is a 76 y.o. female who was seen today for physical therapy evaluation and treatment for SUI, fecal leakage, and levator spasm.  Stool leakage has improved by 30%. Leakage is mostly water. Patient is able to use the largest dilator now. Patient has not had penile penetration yet.   Pelvic floor strength is 3/5 with circular hug of therapist finger and able to fully relax.  Patient continues to have coccyx pain.   She was able to have 2 fingers together with the manual work. Patient will benefit from skilled therapy to improve pain and function.      OBJECTIVE IMPAIRMENTS decreased activity tolerance, decreased coordination, decreased endurance, decreased strength, increased fascial restrictions, increased muscle spasms, impaired tone, and pain.    ACTIVITY  LIMITATIONS continence and toileting   PARTICIPATION LIMITATIONS: interpersonal relationship   PERSONAL FACTORS Sex, Time since onset of injury/illness/exacerbation, and 3+ comorbidities:    Abdominal hysterectomy 1978; right breast cancer estrogen receptor negative; lumbar laminectomy are also affecting patient's functional outcome.    REHAB POTENTIAL: Excellent   CLINICAL DECISION MAKING: Evolving/moderate complexity   EVALUATION COMPLEXITY: Moderate     GOALS: Goals reviewed with patient? Yes   SHORT TERM GOALS: Target date: 05/05/2022   Patient is independent with pelvic drop and diaphragmatic breathing to relax her pelvic floor.  Baseline: Goal status: Met 05/19/2022   2.  Patient understands how to massage the external perineal area to relax the pelvic floor.  Baseline:  Goal status: Met 05/19/2022   3.  Patient able to have the Q-tip inserted into the vaginal canal without pain.  Baseline:  Goal status: Met 05/19/2022   4.  Patient is using the vaginal vibrator on the tissue to improve blood flow to the tissue and relax the tissue.  Baseline:  Goal status: Met 05/26/2022     LONG TERM GOALS: Target date: 06/30/2022    Patient is independent with using the vaginal dilators and on the largest one with pain </= 1/10.  Baseline:  Goal status: Met 08/02/2022   2.  Patient is able to have penile penetration with pain level </= 1/10 and possible with the assistance of the [place].  Baseline: going to start using the 3rd dilator Goal status: ongoing 06/30/2022   3.  Patient reports her stool leakage has improved >/= 75% due to improved strength and coordination of the pelvic floor.  Baseline: happens after she stains and liquid comes out Goal status: ongoing 06/30/2022   4.  Patient reports she is able to hold her urine when she has the urge to urinate and walk to the bathroom.  Baseline: hold urine before she can sit on the commode most of the time Goal status: ongoing  06/30/2022   5.  Patient reports her urinary leakage with coughing, sneezing, exercise and lifting improved >/= 75% due to the improved mobility of the pelvic floor muscles.  Baseline: no change at this time Goal status: ongoing 06/30/2022       PLAN: PT FREQUENCY: 1x/week   PT DURATION: 12 weeks   PLANNED INTERVENTIONS: Therapeutic exercises, Therapeutic activity, Neuromuscular re-education, Patient/Family education, Self Care, Dry Needling, Electrical stimulation, Taping, Biofeedback, and Manual therapy   PLAN FOR NEXT SESSION: manual work to the vagina internally ,use the [place]  harvest, possible discharge  Earlie Counts, PT 08/02/22 11:44 AM

## 2022-08-05 ENCOUNTER — Ambulatory Visit (INDEPENDENT_AMBULATORY_CARE_PROVIDER_SITE_OTHER): Payer: Medicare Other

## 2022-08-05 ENCOUNTER — Ambulatory Visit (INDEPENDENT_AMBULATORY_CARE_PROVIDER_SITE_OTHER): Payer: Medicare Other | Admitting: Family Medicine

## 2022-08-05 ENCOUNTER — Encounter: Payer: Self-pay | Admitting: Family Medicine

## 2022-08-05 VITALS — BP 112/70 | HR 75 | Temp 98.5°F | Ht 62.0 in | Wt 116.9 lb

## 2022-08-05 VITALS — BP 112/70 | HR 75 | Temp 98.5°F | Ht 62.0 in | Wt 116.0 lb

## 2022-08-05 DIAGNOSIS — J209 Acute bronchitis, unspecified: Secondary | ICD-10-CM

## 2022-08-05 DIAGNOSIS — I480 Paroxysmal atrial fibrillation: Secondary | ICD-10-CM

## 2022-08-05 DIAGNOSIS — Z0000 Encounter for general adult medical examination without abnormal findings: Secondary | ICD-10-CM

## 2022-08-05 DIAGNOSIS — E782 Mixed hyperlipidemia: Secondary | ICD-10-CM

## 2022-08-05 MED ORDER — SIMVASTATIN 40 MG PO TABS: ORAL_TABLET | ORAL | 0 refills | Status: DC

## 2022-08-05 MED ORDER — METOPROLOL TARTRATE 25 MG PO TABS: ORAL_TABLET | ORAL | 3 refills | Status: DC

## 2022-08-05 MED ORDER — METHYLPREDNISOLONE 4 MG PO TBPK: ORAL_TABLET | ORAL | 0 refills | Status: DC

## 2022-08-05 NOTE — Assessment & Plan Note (Signed)
HR is controlled currently on metoprolol 12.5 mg BID, will refill her medication today.

## 2022-08-05 NOTE — Progress Notes (Signed)
Subjective:   Joanne Hansen is a 76 y.o. female who presents for Medicare Annual (Subsequent) preventive examination.  Review of Systems     Cardiac Risk Factors include: advanced age (>13mn, >>33women)     Objective:    Today's Vitals   08/05/22 1116  BP: 112/70  Pulse: 75  Temp: 98.5 F (36.9 C)  TempSrc: Oral  SpO2: 98%  Weight: 116 lb (52.6 kg)  Height: '5\' 2"'$  (1.575 m)   Body mass index is 21.22 kg/m.     08/05/2022   11:26 AM 04/07/2022   12:35 PM 08/03/2021   11:39 AM 07/28/2020    3:04 PM 10/08/2019   11:57 AM 09/27/2019    7:38 AM 08/14/2019   10:35 AM  Advanced Directives  Does Patient Have a Medical Advance Directive? No No No No No No No  Would patient like information on creating a medical advance directive? No - Patient declined No - Patient declined No - Patient declined No - Patient declined No - Patient declined No - Patient declined     Current Medications (verified) Outpatient Encounter Medications as of 08/05/2022  Medication Sig   CALCIUM-VITAMIN D PO Take 2 capsules by mouth daily. Vit D 1000 with minerals   carboxymethylcellulose (REFRESH PLUS) 0.5 % SOLN 1 drop as needed.   Cyanocobalamin (VITAMIN B 12 PO) Take 1,000 mg by mouth daily. sublingal   estradiol (ESTRACE) 0.1 MG/GM vaginal cream Place 1 Applicatorful vaginally at bedtime.   metoprolol tartrate (LOPRESSOR) 25 MG tablet TAKE 1/2 (ONE-HALF) TABLET BY MOUTH TWICE DAILY   simvastatin (ZOCOR) 40 MG tablet Take 1 tablet daily   No facility-administered encounter medications on file as of 08/05/2022.    Allergies (verified) Penicillins and Sulfa antibiotics   History: Past Medical History:  Diagnosis Date   Arthritis    Breast cancer ([place]    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   Family history of brain cancer    Family history of breast cancer    Family history of colon cancer    Fecal incontinence    05-16-2019 per pt only a little leakage but controllable ([facility]   Frequent headaches    History of syncope    pre-syncope  08/ 2015  per pt no issue with this since    Hyperlipidemia    Malignant neoplasm of upper-inner quadrant of right breast in female, estrogen receptor negative ([facility] oncologist-- dr mJana Hakim dr moody   dx 08/ 2020--  invasive ductal carcinoma,  05-13-2019  s/p right breast lumpectomy    PAF (paroxysmal atrial fibrillation) ([place] (05-16-2019   [place] has not established a cardiology yet--  previously seen by dr sLowella Dandycossu (last office note dated 02-28-2018 scanned in epic)---  echo 04-15-2014  ef 55-60%, G1DD mild AR without stenosis, RVSp 385mg   Personal history of radiation therapy    stopped 09/22/19    Rectal cyst    Thyroid goiter    pt ultrasound in epic 06-04-2014 , nodules, no bx done   Urinary incontinence, mixed    Past Surgical History:  Procedure Laterality Date   ABDOMINAL HYSTERECTOMY  1978   w/  RSO  and APPENDECTOMY   BREAST BIOPSY Right 08/2009   benign   BREAST LUMPECTOMY WITH RADIOACTIVE SEED AND SENTINEL LYMPH NODE BIOPSY Right 05/15/2019   Procedure: RIGHT BREAST LUMPECTOMY WITH RADIOACTIVE SEED AND SENTINEL  LYMPH NODE MAPPING;  Surgeon: Erroll Luna, MD;  Location: [place];  Service: General;  Laterality: Right;   BREAST SURGERY  2011   biospy of right breast   CATARACT EXTRACTION W/ INTRAOCULAR LENS  IMPLANT, BILATERAL  2004   COLONOSCOPY  11/21/2019   FLEXIBLE SIGMOIDOSCOPY N/A 05/18/2019   Procedure: FLEXIBLE SIGMOIDOSCOPY,  TRANSANAL EXCISION inclusion cyst;  Surgeon: Leighton Ruff, MD;  Location: [facility];  Service: General;  Laterality: N/A;   gluteus medius repair  2017   LUMBAR LAMINECTOMY  2013   L5 -- S1   PORT-A-CATH REMOVAL Right 09/27/2019   Procedure: PORT REMOVAL;  Surgeon: Erroll Luna, MD;  Location: [place];  Service: General;   Laterality: Right;   PORTACATH PLACEMENT Right 05/15/2019   Procedure: INSERTION PORT-A-CATH WITH ULTRASOUND;  Surgeon: Erroll Luna, MD;  Location: [place];  Service: General;  Laterality: Right;   ROTATOR CUFF REPAIR Left 2001   Family History  Problem Relation Age of Onset   COPD Mother    Cancer Mother 78       colon   Arthritis Mother    Hypertension Mother    Miscarriages / [place] Mother    Stroke Mother 54   Alzheimer's disease Mother 67   Heart disease Father    Heart attack Father 34   Lung disease Father    Arthritis Sister    Breast cancer Sister 24       Lumpectomy   Diabetes Maternal Grandmother    Breast cancer Niece 26       Louise's Daughter   Cervical cancer Niece 92   Social History   Socioeconomic History   Marital status: Married    Spouse name: Not on file   Number of children: Not on file   Years of education: Not on file   Highest education level: Not on file  Occupational History   Not on file  Tobacco Use   Smoking status: Never   Smokeless tobacco: Never  Vaping Use   Vaping Use: Never used  Substance and Sexual Activity   Alcohol use: Yes    Comment: Cocktails-3 days per week   Drug use: Never    Types: Amphetamines   Sexual activity: Not Currently    Birth control/protection: Surgical  Other Topics Concern   Not on file  Social History Narrative   Not on file   Social Determinants of Health   Financial Resource Strain: Low Risk  (08/05/2022)   Overall Financial Resource Strain (CARDIA)    Difficulty of Paying Living Expenses: Not hard at all  Food Insecurity: No Food Insecurity (08/05/2022)   Hunger Vital Sign    Worried About Running Out of Food in the Last Year: Never true    Ran Out of Food in the Last Year: Never true  Transportation Needs: No Transportation Needs (08/05/2022)   PRAPARE - Hydrologist (Medical): No    Lack of Transportation (Non-Medical): No  Physical  Activity: Sufficiently Active (08/05/2022)   Exercise Vital Sign    Days of Exercise per Week: 5 days    Minutes of Exercise per Session: 40 min  Stress: No Stress Concern Present (08/05/2022)   [place]    Feeling of Stress : Not at all  Social Connections: Moderately Isolated (08/05/2022)   Social Connection and Isolation Panel [NHANES]    Frequency of Communication with Friends  and Family: More than three times a week    Frequency of Social Gatherings with Friends and Family: More than three times a week    Attends Religious Services: Never    Marine scientist or Organizations: No    Attends Music therapist: Never    Marital Status: Married    Tobacco Counseling Counseling given: Not Answered   Clinical Intake:  Pre-visit preparation completed: No  Pain : No/denies pain     BMI - recorded: 21.38 Nutritional Status: BMI of 19-24  Normal Nutritional Risks: None Diabetes: No  How often do you need to have someone help you when you read instructions, pamphlets, or other written materials from your doctor or pharmacy?: 1 - Never  Diabetic? No  Interpreter Needed?: No  Information entered by :: Rolene Arbour LPN   Activities of Daily Living    08/05/2022   11:24 AM  In your present state of health, do you have any difficulty performing the following activities:  Hearing? 0  Vision? 0  Difficulty concentrating or making decisions? 0  Walking or climbing stairs? 0  Dressing or bathing? 0  Doing errands, shopping? 0  Preparing Food and eating ? N  Using the Toilet? N  In the past six months, have you accidently leaked urine? Y  Comment Wears breifs followed by GYN  Do you have problems with loss of bowel control? N  Managing your Medications? N  Managing your Finances? N  Housekeeping or managing your Housekeeping? N    Patient Care Team: Farrel Conners, MD as PCP -  General (Family Medicine) Lorretta Harp, MD as PCP - Cardiology (Cardiology) Magrinat, Virgie Dad, MD (Inactive) as Consulting Physician (Oncology) Erroll Luna, MD as Consulting Physician (General Surgery) Kyung Rudd, MD as Consulting Physician (Radiation Oncology) Leighton Ruff, MD as Consulting Physician (General Surgery) Bjorn Loser, MD as Consulting Physician (Urology) Suella Broad, MD as Consulting Physician (Physical Medicine and Rehabilitation) Marylynn Pearson, MD as Consulting Physician (Obstetrics and Gynecology)  Indicate any recent [place] you may have received from other than [facility] providers in the past year (date may be approximate).     Assessment:   This is a routine wellness examination for Kajol.  Hearing/Vision screen Hearing Screening - Comments:: Denies hearing difficulties   Vision Screening - Comments:: Wears rx glasses - up to date with routine eye exams with  Dr Delman Cheadle  Dietary issues and exercise activities discussed: Exercise limited by: None identified   Goals Addressed               This Visit's Progress     Patient Stated (pt-stated)        I would like to stay healthy.       Depression Screen    08/05/2022   11:23 AM 08/05/2022   10:30 AM 08/05/2021    9:07 AM 08/03/2021   11:28 AM 07/28/2020    3:15 PM 07/02/2019   11:50 AM 11/08/2018   11:42 AM  PHQ 2/9 Scores  PHQ - 2 Score 0 0 0 0 0 0 0  PHQ- 9 Score 0 0 0  0      Fall Risk    08/05/2022   11:25 AM 08/05/2022   10:30 AM 08/03/2021   11:31 AM 07/28/2020    3:11 PM 07/02/2019   11:49 AM  Fall Risk   Falls in the past year? 1 1  0 0  Comment  06/29/2022 per patient  Number falls in past yr: 0 0 0 0 0  Injury with Fall? 0 1 0 0 0  Comment No injury. Followed by medical attention      Risk for fall due to : No Fall Risks History of fall(s)  No Fall Risks   Follow up Falls prevention discussed Falls evaluation completed  Falls evaluation  completed;Falls prevention discussed     FALL RISK PREVENTION PERTAINING TO THE HOME:  Any stairs in or around the home? No  If so, are there any without handrails? No  Home free of loose throw rugs in walkways, pet beds, electrical cords, etc? Yes  Adequate lighting in your home to reduce risk of falls? Yes   ASSISTIVE DEVICES UTILIZED TO PREVENT FALLS:  Life alert? No  Use of a cane, walker or w/c? No  Grab bars in the bathroom? Yes  Shower chair or bench in shower? Yes  Elevated toilet seat or a handicapped toilet? Yes  TIMED UP AND GO:  Was the test performed? Yes .  Length of time to ambulate 10 feet: 10 sec.   Gait steady and fast without use of assistive device  Cognitive Function:        08/05/2022   11:27 AM 08/03/2021   11:37 AM  6CIT Screen  What Year? 0 points 0 points  What month? 0 points 0 points  What time? 0 points 0 points  Count back from 20 0 points 0 points  Months in reverse 0 points 0 points  Repeat phrase 0 points 0 points  Total Score 0 points 0 points    Immunizations Immunization History  Administered Date(s) Administered   DTaP 04/12/2012   Fluad Quad(high Dose 65+) 05/29/2021   Influenza, High Dose Seasonal PF 05/02/2019   Influenza-Unspecified 06/01/2011, 05/17/2012, 06/27/2013, 06/03/2014, 05/19/2015, 04/23/2017, 05/06/2018, 05/02/2019, 05/15/2022   PFIZER(Purple Top)SARS-COV-2 Vaccination 09/16/2019, 10/22/2019, 06/28/2020, 01/01/2021   Pneumococcal Conjugate-13 06/03/2014   Pneumococcal Polysaccharide-23 12/26/2012   Td 08/23/1994   Tdap 04/11/2012, 04/12/2012, 06/29/2022   Zoster Recombinat (Shingrix) 04/05/2021, 06/11/2021   Zoster, Live 09/04/2010    TDAP status: Up to date  Flu Vaccine status: Up to date  Pneumococcal vaccine status: Up to date  Covid-19 vaccine status: Completed vaccines  Qualifies for Shingles Vaccine? Yes   Zostavax completed Yes   Shingrix Completed?: Yes  Screening Tests Health  Maintenance  Topic Date Due   COVID-19 Vaccine (5 - 2023-24 season) 08/21/2022 (Originally 04/23/2022)   Medicare Annual Wellness ([place])  08/06/2023   MAMMOGRAM  04/14/2024   COLONOSCOPY (Pts 45-30yr Insurance coverage will need to be confirmed)  11/20/2024   DTaP/Tdap/Td (6 - Td or Tdap) 06/29/2032   Pneumonia Vaccine 74 Years old  Completed   INFLUENZA VACCINE  Completed   DEXA SCAN  Completed   Hepatitis C Screening  Completed   Zoster Vaccines- Shingrix  Completed   HPV VACCINES  Aged Out    Health Maintenance  There are no preventive care reminders to display for this patient.   Colorectal cancer screening: Type of screening: Colonoscopy. Completed 11/21/19. Repeat every 5 years  Mammogram status: Completed 8/23/2. Repeat every year  Bone Density status: Completed 04/10/20. Results reflect: Bone density results: OSTEOPOROSIS. Repeat every   years.  Lung Cancer Screening: (Low Dose CT Chest recommended if Age 74-80years, 30 pack-year currently smoking OR have quit w/in 15years.) does not qualify.     Additional Screening:  Hepatitis C Screening: does qualify; Completed 07/02/19  Vision Screening: Recommended annual  ophthalmology exams for early detection of glaucoma and other disorders of the eye. Is the patient up to date with their annual eye exam?  Yes  Who is the provider or what is the name of the office in which the patient attends annual eye exams? Dr Delman Cheadle If pt is not established with a provider, would they like to be referred to a provider to establish care? No .   Dental Screening: Recommended annual dental exams for proper oral hygiene  Community Resource Referral / Chronic Care Management:  CRR required this visit?  No   CCM required this visit?  No      Plan:     I have personally reviewed and noted the following in the patient's chart:   Medical and social history Use of alcohol, tobacco or illicit drugs  Current medications and supplements  including opioid prescriptions. Patient is not currently taking opioid prescriptions. Functional ability and status Nutritional status Physical activity Advanced directives List of other physicians Hospitalizations, surgeries, and ER visits in previous 12 months Vitals Screenings to include cognitive, depression, and falls Referrals and appointments  In addition, I have reviewed and discussed with patient certain preventive protocols, quality metrics, and best practice recommendations. A written personalized care plan for preventive services as well as general preventive health recommendations were provided to patient.     Criselda Peaches, LPN   25/85/[id number]   Nurse Notes: None

## 2022-08-05 NOTE — Assessment & Plan Note (Signed)
On simvastatin 40 mg daily, doing well on this medication, she is due for her lipid panel today, orders placed, refilled her statin today also.

## 2022-08-05 NOTE — Progress Notes (Signed)
Established Patient Office Visit  Subjective   Patient ID: Joanne Hansen, female    DOB: [birth year]  Age: 76 y.o. MRN: [id number]  Chief Complaint  Patient presents with   Establish Care    Patient is here for transition of care visit.  Pt reports she was recently diagnosed with a URI or upper airway infection, states she was seen at urgent care 4 days ago and was given a cough syrup and nasal spray to reduce her symptoms. She continues to report nasal congestion and coughing, no fever/chills, no sore throat, no chest pain or SOB. States that the symptoms are mildly better but the coughing is still very nagging.  HLD-- pt is taking simvastatin 40 mg daily, she reports compliance with this medication, no side effects are reported today. I reviewed her last set of labs, she needs a new lipid panel for surveillance.   Paroxsymal a fib-- on metoprolol 12.5 mg twice a day. She reports no episodes of palpitations, reports no side effects to the metoprolol, no dizziness or fatigue.    Current Outpatient Medications  Medication Instructions   CALCIUM-VITAMIN D PO 2 capsules, Oral, Daily, Vit D 1000 with minerals    carboxymethylcellulose (REFRESH PLUS) 0.5 % SOLN 1 drop, As needed   Cyanocobalamin (VITAMIN B 12 PO) 1,000 mg, Oral, Daily, sublingal    estradiol (ESTRACE) 0.1 MG/GM vaginal cream 1 Applicatorful, Vaginal, Daily at bedtime   methylPREDNISolone (MEDROL DOSEPAK) 4 MG TBPK tablet Take package as directed.   metoprolol tartrate (LOPRESSOR) 25 MG tablet TAKE 1/2 (ONE-HALF) TABLET BY MOUTH TWICE DAILY   simvastatin (ZOCOR) 40 MG tablet Take 1 tablet daily    Patient Active Problem List   Diagnosis Date Noted   Palpitations 01/20/2021   Anemia, macrocytic 09/25/2019   Paroxysmal atrial fibrillation ([place]) 08/02/2019   Dizziness 08/02/2019   Lower extremity edema 08/02/2019   Genetic testing 05/16/2019   Malignant neoplasm of upper-inner quadrant of right breast in female,  estrogen receptor negative ([place]) 05/07/2019   Family history of breast cancer    Family history of colon cancer    Family history of brain cancer    Hyperlipidemia 11/08/2018   Fecal incontinence 11/08/2018   Urinary, incontinence, stress female 11/08/2018      Review of Systems  All other systems reviewed and are negative.     Objective:     BP 112/70 (BP Location: Left Arm, Patient Position: Sitting, Cuff Size: Normal)   Pulse 75   Temp 98.5 F (36.9 C) (Oral)   Ht '5\' 2"'$  (1.575 m)   Wt 116 lb 14.4 oz (53 kg)   SpO2 98%   BMI 21.38 kg/m    Physical Exam Vitals reviewed.  Constitutional:      Appearance: Normal appearance. She is well-groomed and normal weight.  HENT:     Right Ear: Tympanic membrane normal.     Left Ear: Tympanic membrane normal.     Nose: Congestion present.  Eyes:     Conjunctiva/sclera: Conjunctivae normal.  Neck:     Thyroid: No thyromegaly.  Cardiovascular:     Rate and Rhythm: Normal rate and regular rhythm.     Pulses: Normal pulses.     Heart sounds: S1 normal and S2 normal.  Pulmonary:     Effort: Pulmonary effort is normal.     Breath sounds: Normal air entry. Wheezing (inspiratory and expiratory wheezing scattered throughout lung fields) present.  Neurological:     Mental Status: She is  alert and oriented to person, place, and time. Mental status is at baseline.     Gait: Gait is intact.  Psychiatric:        Mood and Affect: Mood and affect normal.        Speech: Speech normal.        Behavior: Behavior normal.        Judgment: Judgment normal.      No results found for any visits on 08/05/22.  Last CBC Lab Results  Component Value Date   WBC 4.9 06/25/2022   HGB 13.6 06/25/2022   HCT 40.4 06/25/2022   MCV 99.5 06/25/2022   MCH 33.5 06/25/2022   RDW 11.9 06/25/2022   PLT 199 00/86/7619   Last metabolic panel Lab Results  Component Value Date   GLUCOSE 84 06/25/2022   NA 140 06/25/2022   K 4.3 06/25/2022   CL  105 06/25/2022   CO2 29 06/25/2022   BUN 16 06/25/2022   CREATININE 0.70 06/25/2022   GFRNONAA >60 06/25/2022   CALCIUM 9.4 06/25/2022   PROT 6.7 06/25/2022   ALBUMIN 4.2 06/25/2022   BILITOT 0.9 06/25/2022   ALKPHOS 55 06/25/2022   AST 16 06/25/2022   ALT 12 06/25/2022   ANIONGAP 6 06/25/2022      The 10-year ASCVD risk score (Arnett DK, et al., 2019) is: 11.1%    Assessment & Plan:   Problem List Items Addressed This Visit       Unprioritized   Hyperlipidemia - Primary    Chronic, On simvastatin 40 mg daily, doing well on this medication, she is due for her lipid panel today, orders placed, refilled her statin today also.      Relevant Medications   simvastatin (ZOCOR) 40 MG tablet   metoprolol tartrate (LOPRESSOR) 25 MG tablet   Other Relevant Orders   Lipid Panel   Paroxysmal atrial fibrillation (HCC)    Chronic, HR is controlled currently on metoprolol 12.5 mg BID, will refill her medication today.      Relevant Medications   simvastatin (ZOCOR) 40 MG tablet   metoprolol tartrate (LOPRESSOR) 25 MG tablet   Other Visit Diagnoses     Acute bronchitis, unspecified organism       Relevant Medications   Patient has wheezing on exam scattered throughout the lung fields, her symptoms are persistent despite use of cough medication and nasal spray. I advised a short course of methylprednisolone 4 mg tablets, to be tapered over the course of 6 days to help reduce inflammation of the airways. Pt advised that if her sx persist beyond the next week to call the office and we will discuss abx therapy at that time.  methylPREDNISolone (MEDROL DOSEPAK) 4 MG TBPK tablet       Return in about 1 year (around 08/06/2023) for Annual Physical Exam.    Farrel Conners, MD

## 2022-08-05 NOTE — Patient Instructions (Addendum)
[name] , Thank you for taking time to come for your Medicare Wellness Visit. I appreciate your ongoing commitment to your health goals. Please review the following plan we discussed and let me know if I can assist you in the future.   These are the goals we discussed:  Goals       Patient stated      I will continue to walk at least a couple miles 5 times per week .      Patient Stated (pt-stated)      I would like to stay healthy.        This is a list of the screening recommended for you and due dates:  Health Maintenance  Topic Date Due   COVID-19 Vaccine (5 - 2023-24 season) 08/21/2022*   Medicare Annual Wellness Visit  08/06/2023   Mammogram  04/14/2024   Colon Cancer Screening  11/20/2024   DTaP/Tdap/Td vaccine (6 - Td or Tdap) 06/29/2032   Pneumonia Vaccine  Completed   Flu Shot  Completed   DEXA scan (bone density measurement)  Completed   Hepatitis C Screening: USPSTF Recommendation to screen - Ages 56-79 yo.  Completed   Zoster (Shingles) Vaccine  Completed   HPV Vaccine  Aged Out  *Topic was postponed. The date shown is not the original due date.    Advanced directives: Advance directive discussed with you today. Even though you declined this today, please call our office should you change your mind, and we can give you the proper paperwork for you to fill out.   Conditions/risks identified: None  Next appointment: Follow up in one year for your annual wellness visit     Preventive Care 65 Years and Older, Female Preventive care refers to lifestyle choices and visits with your health care provider that can promote health and wellness. What does preventive care include? A yearly physical exam. This is also called an annual well check. Dental exams once or twice a year. Routine eye exams. Ask your health care provider how often you should have your eyes checked. Personal lifestyle choices, including: Daily care of your teeth and gums. Regular physical  activity. Eating a healthy diet. Avoiding tobacco and drug use. Limiting alcohol use. Practicing safe sex. Taking low-dose aspirin every day. Taking vitamin and mineral supplements as recommended by your health care provider. What happens during an annual well check? The services and screenings done by your health care provider during your annual well check will depend on your age, overall health, lifestyle risk factors, and family history of disease. Counseling  Your health care provider may ask you questions about your: Alcohol use. Tobacco use. Drug use. Emotional well-being. Home and relationship well-being. Sexual activity. Eating habits. History of falls. Memory and ability to understand (cognition). Work and work Statistician. Reproductive health. Screening  You may have the following tests or measurements: Height, weight, and BMI. Blood pressure. Lipid and cholesterol levels. These may be checked every 5 years, or more frequently if you are over 59 years old. Skin check. Lung cancer screening. You may have this screening every year starting at age 50 if you have a 30-pack-year history of smoking and currently smoke or have quit within the past 15 years. Fecal occult blood test (FOBT) of the stool. You may have this test every year starting at age 16. Flexible sigmoidoscopy or colonoscopy. You may have a sigmoidoscopy every 5 years or a colonoscopy every 10 years starting at age 51. Hepatitis C blood test.  Hepatitis B blood test. Sexually transmitted disease (STD) testing. Diabetes screening. This is done by checking your blood sugar (glucose) after you have not eaten for a while (fasting). You may have this done every 1-3 years. Bone density scan. This is done to screen for osteoporosis. You may have this done starting at age 78. Mammogram. This may be done every 1-2 years. Talk to your health care provider about how often you should have regular mammograms. Talk with your  health care provider about your test results, treatment options, and if necessary, the need for more tests. Vaccines  Your health care provider may recommend certain vaccines, such as: Influenza vaccine. This is recommended every year. Tetanus, diphtheria, and acellular pertussis (Tdap, Td) vaccine. You may need a Td booster every 10 years. Zoster vaccine. You may need this after age 65. Pneumococcal 13-valent conjugate (PCV13) vaccine. One dose is recommended after age 64. Pneumococcal polysaccharide (PPSV23) vaccine. One dose is recommended after age 27. Talk to your health care provider about which screenings and vaccines you need and how often you need them. This information is not intended to replace advice given to you by your health care provider. Make sure you discuss any questions you have with your health care provider. Document Released: 09/05/2015 Document Revised: 04/28/2016 Document Reviewed: 06/10/2015 Elsevier Interactive Patient Education  2017 [employer] Prevention in the Home Falls can cause injuries. They can happen to people of all ages. There are many things you can do to make your home safe and to help prevent falls. What can I do on the outside of my home? Regularly fix the edges of walkways and driveways and fix any cracks. Remove anything that might make you trip as you walk through a door, such as a raised step or threshold. Trim any bushes or trees on the path to your home. Use bright outdoor lighting. Clear any walking paths of anything that might make someone trip, such as rocks or tools. Regularly check to see if handrails are loose or broken. Make sure that both sides of any steps have handrails. Any raised decks and porches should have guardrails on the edges. Have any leaves, snow, or ice cleared regularly. Use sand or salt on walking paths during winter. Clean up any spills in your garage right away. This includes oil or grease spills. What can I  do in the bathroom? Use night lights. Install grab bars by the toilet and in the tub and shower. Do not use towel bars as grab bars. Use non-skid mats or decals in the tub or shower. If you need to sit down in the shower, use a plastic, non-slip stool. Keep the floor dry. Clean up any water that spills on the floor as soon as it happens. Remove soap buildup in the tub or shower regularly. Attach bath mats securely with double-sided non-slip rug tape. Do not have throw rugs and other things on the floor that can make you trip. What can I do in the bedroom? Use night lights. Make sure that you have a light by your bed that is easy to reach. Do not use any sheets or blankets that are too big for your bed. They should not hang down onto the floor. Have a firm chair that has side arms. You can use this for support while you get dressed. Do not have throw rugs and other things on the floor that can make you trip. What can I do in the kitchen? Clean up  any spills right away. Avoid walking on wet floors. Keep items that you use a lot in easy-to-reach places. If you need to reach something above you, use a strong step stool that has a grab bar. Keep electrical cords out of the way. Do not use floor polish or wax that makes floors slippery. If you must use wax, use non-skid floor wax. Do not have throw rugs and other things on the floor that can make you trip. What can I do with my stairs? Do not leave any items on the stairs. Make sure that there are handrails on both sides of the stairs and use them. Fix handrails that are broken or loose. Make sure that handrails are as long as the stairways. Check any carpeting to make sure that it is firmly attached to the stairs. Fix any carpet that is loose or worn. Avoid having throw rugs at the top or bottom of the stairs. If you do have throw rugs, attach them to the floor with carpet tape. Make sure that you have a light switch at the top of the stairs  and the bottom of the stairs. If you do not have them, ask someone to add them for you. What else can I do to help prevent falls? Wear shoes that: Do not have high heels. Have rubber bottoms. Are comfortable and fit you well. Are closed at the toe. Do not wear sandals. If you use a stepladder: Make sure that it is fully opened. Do not climb a closed stepladder. Make sure that both sides of the stepladder are locked into place. Ask someone to hold it for you, if possible. Clearly mark and make sure that you can see: Any grab bars or handrails. First and last steps. Where the edge of each step is. Use tools that help you move around (mobility aids) if they are needed. These include: Canes. Walkers. Scooters. Crutches. Turn on the lights when you go into a dark area. Replace any light bulbs as soon as they burn out. Set up your furniture so you have a clear path. Avoid moving your furniture around. If any of your floors are uneven, fix them. If there are any pets around you, be aware of where they are. Review your medicines with your doctor. Some medicines can make you feel dizzy. This can increase your chance of falling. Ask your doctor what other things that you can do to help prevent falls. This information is not intended to replace advice given to you by your health care provider. Make sure you discuss any questions you have with your health care provider. Document Released: 06/05/2009 Document Revised: 01/15/2016 Document Reviewed: 09/13/2014 Elsevier Interactive Patient Education  2017 [employer].

## 2022-08-09 ENCOUNTER — Telehealth: Payer: Self-pay | Admitting: Family Medicine

## 2022-08-09 DIAGNOSIS — J209 Acute bronchitis, unspecified: Secondary | ICD-10-CM

## 2022-08-09 MED ORDER — AZITHROMYCIN 250 MG PO TABS: ORAL_TABLET | ORAL | 0 refills | Status: AC

## 2022-08-09 MED ORDER — ALBUTEROL SULFATE HFA 108 (90 BASE) MCG/ACT IN AERS: 2.0000 | INHALATION_SPRAY | Freq: Four times a day (QID) | RESPIRATORY_TRACT | 0 refills | Status: DC | PRN

## 2022-08-09 NOTE — Telephone Encounter (Signed)
I sent in an albuterol inhaler and a zpak for her

## 2022-08-09 NOTE — Telephone Encounter (Signed)
Patient informed of the message below.

## 2022-08-09 NOTE — Telephone Encounter (Signed)
Pt was seen on 08/05/22  Pt has been taking the prednisone and still does not feel any better. Cough is very dry and non-productive. Pt was coughing a lot while trying to speak with me.  Pt is asking for something stronger to help break up the congestion in her chest.  [facility], [place] Phone: [phone]  Fax: [phone]

## 2022-08-11 ENCOUNTER — Encounter: Payer: Medicare Other | Admitting: Physical Therapy

## 2022-08-17 ENCOUNTER — Other Ambulatory Visit (INDEPENDENT_AMBULATORY_CARE_PROVIDER_SITE_OTHER): Payer: Medicare Other

## 2022-08-17 DIAGNOSIS — E782 Mixed hyperlipidemia: Secondary | ICD-10-CM | POA: Diagnosis not present

## 2022-08-17 LAB — LIPID PANEL
Cholesterol: 127 mg/dL (ref 0–200)
HDL: 58.3 mg/dL (ref >39.00)
LDL Cholesterol: 59 mg/dL (ref 0–99)
NonHDL: 68.83
Total CHOL/HDL Ratio: 2
Triglycerides: 51 mg/dL (ref 0.0–149.0)
VLDL: 10.2 mg/dL (ref 0.0–40.0)

## 2022-08-17 NOTE — Progress Notes (Signed)
Choelsterol panel looks great, will monitor this yearly.

## 2022-09-10 ENCOUNTER — Ambulatory Visit: Payer: Medicare Other | Attending: Obstetrics and Gynecology | Admitting: Physical Therapy

## 2022-09-10 ENCOUNTER — Encounter: Payer: Self-pay | Admitting: Physical Therapy

## 2022-09-10 DIAGNOSIS — M6281 Muscle weakness (generalized): Secondary | ICD-10-CM | POA: Diagnosis present

## 2022-09-10 DIAGNOSIS — R102 Pelvic and perineal pain unspecified side: Secondary | ICD-10-CM

## 2022-09-10 DIAGNOSIS — R252 Cramp and spasm: Secondary | ICD-10-CM | POA: Diagnosis present

## 2022-09-10 NOTE — Therapy (Signed)
OUTPATIENT PHYSICAL THERAPY TREATMENT NOTE   Patient Name: Joanne Hansen MRN: [id number] DOB:1946-02-15, 77 y.o., female Today's Date: 09/10/2022  PCP: Caren Macadam, MD  REFERRING PROVIDER: Jaquita Folds, MD   END OF SESSION:   PT End of Session - 09/10/22 1057     Visit Number 13    Date for PT Re-Evaluation 09/22/22    Authorization Type Medicare    Authorization - Visit Number 13    Authorization - Number of Visits 20    PT Start Time 1100    PT Stop Time 0102    PT Time Calculation (min) 45 min    Activity Tolerance Patient tolerated treatment well    Behavior During Therapy WFL for tasks assessed/performed             Past Medical History:  Diagnosis Date   Arthritis    Breast cancer ([place])    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   Family history of brain cancer    Family history of breast cancer    Family history of colon cancer    Fecal incontinence    05-16-2019 per pt only a little leakage but controllable (was in clinical trial for [facility] without benefit.)   Frequent headaches    History of syncope    pre-syncope  08/ 2015  per pt no issue with this since    Hyperlipidemia    Malignant neoplasm of upper-inner quadrant of right breast in female, estrogen receptor negative [facility]) oncologist-- dr Jana Hakim  dr moody   dx 08/ 2020--  invasive ductal carcinoma,  05-13-2019  s/p right breast lumpectomy    PAF (paroxysmal atrial fibrillation) ([place]) (05-16-2019   pt from [place], recently moved to [place] to be near daughter, has not established a cardiology yet--  previously seen by dr Lowella Dandy cossu (last office note dated 02-28-2018 scanned in epic)---  echo 04-15-2014  ef 55-60%, G1DD mild AR without stenosis, RVSp 27mHg   Personal history of radiation therapy    stopped 09/22/19    Rectal cyst    Thyroid goiter    pt ultrasound in epic 06-04-2014 , nodules, no bx done   Urinary incontinence, mixed    Past Surgical History:  Procedure  Laterality Date   ABDOMINAL HYSTERECTOMY  1978   w/  RSO  and APPENDECTOMY   BREAST BIOPSY Right 08/2009   benign   BREAST LUMPECTOMY WITH RADIOACTIVE SEED AND SENTINEL LYMPH NODE BIOPSY Right 05/15/2019   Procedure: RIGHT BREAST LUMPECTOMY WITH RADIOACTIVE SEED AND SENTINEL LYMPH NODE MAPPING;  Surgeon: CErroll Luna MD;  [facility]  Service: General;  Laterality: Right;   BREAST SURGERY  2011   biospy of right breast   CATARACT EXTRACTION W/ INTRAOCULAR LENS  IMPLANT, BILATERAL  2004   COLONOSCOPY  11/21/2019   FLEXIBLE SIGMOIDOSCOPY N/A 05/18/2019   Procedure: FLEXIBLE SIGMOIDOSCOPY,  TRANSANAL EXCISION inclusion cyst;  Surgeon: TLeighton Ruff MD;  [facility]  Service: General;  Laterality: N/A;   gluteus medius repair  2017   LUMBAR LAMINECTOMY  2013   L5 -- S1   PORT-A-CATH REMOVAL Right 09/27/2019   Procedure: PORT REMOVAL;  Surgeon: CErroll Luna MD;  [facility]  Service: General;  Laterality: Right;   PORTACATH PLACEMENT Right 05/15/2019   Procedure: INSERTION PORT-A-CATH WITH ULTRASOUND;  Surgeon: CErroll Luna MD;  Location[facility]  Service: General;  Laterality: Right;   ROTATOR CUFF REPAIR Left 2001  Patient Active Problem List   Diagnosis Date Noted   Palpitations 01/20/2021   Anemia, macrocytic 09/25/2019   Paroxysmal atrial fibrillation ([place]) 08/02/2019   Dizziness 08/02/2019   Lower extremity edema 08/02/2019   Genetic testing 05/16/2019   Malignant neoplasm of upper-inner quadrant of right breast in female, estrogen receptor negative ([place]) 05/07/2019   Family history of breast cancer    Family history of colon cancer    Family history of brain cancer    Hyperlipidemia 11/08/2018   Fecal incontinence 11/08/2018   Urinary, incontinence, stress female 11/08/2018   REFERRING DIAG:  W09.811 (ICD-10-CM) - Levator spasm  R15.9 (ICD-10-CM) - Incontinence of feces,  unspecified fecal incontinence type  N39.3 (ICD-10-CM) - SUI (stress urinary incontinence, female      THERAPY DIAG:  Cramp and spasm   Muscle weakness (generalized)   Pelvic pain   Rationale for Evaluation and Treatment Rehabilitation   ONSET DATE: 2021   SUBJECTIVE:                                                                                                                                              SUBJECTIVE STATEMENT: I was able to have intercourse and was successful. The estrogen cream on the one spot has helped.    PAIN:  Are you having pain? Yes: NPRS scale: 8/10 Pain location: tailbone pain Pain description: deep, intermittent Aggravating factors: walking, sitting and makes it hard to go into standing Relieving factors: nothing   PAIN:  Are you having pain? Yes NPRS scale: 1/10 Pain location: Vaginal lower right of introitus   Pain type: burning Pain description: intermittent    Aggravating factors: penile penetration, vaginal exam Relieving factors: no penile penetration   PRECAUTIONS: Other: breast cancer   WEIGHT BEARING RESTRICTIONS No   FALLS:  Has patient fallen in last 6 months? No   LIVING ENVIRONMENT: Lives with: lives with their spouse     OCCUPATION: retired   PLOF: Independent   PATIENT GOALS ability to have intercourse without pain;    PERTINENT HISTORY:  Abdominal hysterectomy 1978; right breast cancer estrogen receptor negative; lumbar laminectomy   BOWEL MOVEMENT Pain with bowel movement: No Type of bowel movement:Type (Bristol Stool Scale)  type 2, Frequency daily, and Strain Yes Fully empty rectum: Yes: but sometimes has incomplete evacuation.  Leakage: Occurs: after a bowel movement             Consistency with leakage: liquid Fiber supplement: Yes: citrucel   URINATION Pain with urination: No Fully empty bladder: Yes: dribbles after urination Stream: Strong Urgency: Yes: if she waits too long Frequency: 2-3  hours Leakage: Coughing, Sneezing   INTERCOURSE Pain with intercourse: Initial Penetration due to dryness Ability to have vaginal penetration:  No Climax: no  Marinoff Scale: 3/3 Has tried pelvic physical therapy for about 6 months (at [facility]). Used  vaginal dilators and vibrator.   PREGNANCY Vaginal deliveries 1 Tearing No   PROLAPSE Rectocele small and distal      OBJECTIVE: (objective measures completed at initial evaluation unless otherwise dated)       DIAGNOSTIC FINDINGS:  PVR of 12 ml ; biopsy was negative.    COGNITION:            Overall cognitive status: Within functional limits for tasks assessed                          SENSATION:            Light touch: Appears intact            Proprioception: Appears intact                  POSTURE: No Significant postural limitations               PELVIC ALIGNMENT:   LUMBARAROM/PROMlimited by 50-75% due to pain     LOWER EXTREMITY ROM:   Passive ROM Right eval Left eval Right 06/30/2022 Left  06/30/2022 Right 08/02/22 Left  08/02/22  Hip external rotation 50 35 35 35 55 55   (Blank rows = not tested)   LOWER EXTREMITY MMT:   MMT Right eval Left eval Right  06/30/2022 Left  06/30/2022 Right  08/02/22 Left 08/02/22  Hip extension 3/5 3/5 4/5 4/5 4+/5 4+/5  Hip abduction 3/5 3/5 4/5 4/5 4+/5 4+/5  Hip internal rotation 4/5 4/5 4/5 4/5 4+/5 4+/5  Hip external rotation 4/5 4/5 5/5 5/5 4+/5 4+/5     PALPATION:      External Perineal Exam dryness notided                             Internal Pelvic Floor stitch noticed at 6 O'Clock, Tenderness located with Qtip inserted into the vagina. Therapist placed her index finger into the vaginal canal 1/4 of an inch but patient had pain so did not go further.    Patient confirms identification and approves PT to assess internal pelvic floor and treatment Yes   PELVIC MMT:   MMT eval 05/19/2022 05/26/2022 06/30/2022 09/10/22  Vaginal 2/5 3/5 with weak lift 4/5  but takes time to relax 3/5 with good hug of therapist finger and fully relax 4/5  (Blank rows = not tested)        The anal strength not tested due to her having some bleeding rectally yesterday.  TONE: increased   PROLAPSE: none   TODAY'S TREATMENT 09/10/22 Manual: Internal pelvic floor techniques: No emotional/communication barriers or cognitive limitation. Patient is motivated to learn. Patient understands and agrees with treatment goals and plan. PT explains patient will be examined in standing, sitting, and lying down to see how their muscles and joints work. When they are ready, they will be asked to remove their underwear so PT can examine their perineum. The patient is also given the option of providing their own chaperone as one is not provided in our facility. The patient also has the right and is explained the right to defer or refuse any part of the evaluation or treatment including the internal exam. With the patient's consent, PT will use one gloved finger to gently assess the muscles of the pelvic floor, seeing how well it contracts and relaxes and if there is muscle symmetry. After, the patient will get dressed and  PT and patient will discuss exam findings and plan of care. PT and patient discuss plan of care, schedule, attendance policy and HEP activities.  Going through the vaginal working on the puborectalis, iliococcygeus, ischiocavernosus, perineal body, and obturator internist Exercises: Stretches/mobility: Talked to patient on using the dilators 1 time per week to stretch the vaginal canal  08/02/2022 Manual: Internal pelvic floor techniques:No emotional/communication barriers or cognitive limitation. Patient is motivated to learn. Patient understands and agrees with treatment goals and plan. PT explains patient will be examined in standing, sitting, and lying down to see how their muscles and joints work. When they are ready, they will be asked to remove their underwear  so PT can examine their perineum. The patient is also given the option of providing their own chaperone as one is not provided in our facility. The patient also has the right and is explained the right to defer or refuse any part of the evaluation or treatment including the internal exam. With the patient's consent, PT will use one gloved finger to gently assess the muscles of the pelvic floor, seeing how well it contracts and relaxes and if there is muscle symmetry. After, the patient will get dressed and PT and patient will discuss exam findings and plan of care. PT and patient discuss plan of care, schedule, attendance policy and HEP activities.  Going through the vaginal canal working on the tissue to expand the canal and introitus for her to be able to have penile penetration. Monitored for pain. Was able to place two finger in the canal together.  Educated patient on using the dilator prior to penile penetration, using lubricant and her be in control to allow the penetration slowly.    07/21/2022 Manual: Internal pelvic floor techniques:No emotional/communication barriers or cognitive limitation. Patient is motivated to learn. Patient understands and agrees with treatment goals and plan. PT explains patient will be examined in standing, sitting, and lying down to see how their muscles and joints work. When they are ready, they will be asked to remove their underwear so PT can examine their perineum. The patient is also given the option of providing their own chaperone as one is not provided in our facility. The patient also has the right and is explained the right to defer or refuse any part of the evaluation or treatment including the internal exam. With the patient's consent, PT will use one gloved finger to gently assess the muscles of the pelvic floor, seeing how well it contracts and relaxes and if there is muscle symmetry. After, the patient will get dressed and PT and patient will discuss exam  findings and plan of care. PT and patient discuss plan of care, schedule, attendance policy and HEP activities.  Fascial release along the pubic rami, along the urogenital diaphragm, along the ischiocavernosus, and superior transverse while monitoring for pain and elongation of tissue Exercises: Strengthening: Nustep level 4 for 8 minutes while assessing patient    PATIENT EDUCATION:  05/14/2022 Education details: gave patient samples of vaginal moisturizer and cream with lidocaine, educated patient on how to manual massage the vulvar area Person educated: Patient Education method: Consulting civil engineer, Demonstration, and Handouts Education comprehension: verbalized understanding     HOME EXERCISE PROGRAM: See above   ASSESSMENT:   CLINICAL IMPRESSION: Patient is a 77 y.o. female who was seen today for physical therapy evaluation and treatment for SUI, fecal leakage, and levator spasm.  Stool leakage has improved by 75%. Leakage is mostly water. Patient is able  to use the largest dilator now. Patient able to have penile penetration with slight discomfort.    Pelvic floor strength is 4/5 with circular hug of therapist finger and able to fully relax.  Patient continues to have coccyx pain.   She is independent with her dilator and using it 1 time per week.  Urinary leakage is 75% better.    OBJECTIVE IMPAIRMENTS decreased activity tolerance, decreased coordination, decreased endurance, decreased strength, increased fascial restrictions, increased muscle spasms, impaired tone, and pain.    ACTIVITY LIMITATIONS continence and toileting   PARTICIPATION LIMITATIONS: interpersonal relationship   PERSONAL FACTORS Sex, Time since onset of injury/illness/exacerbation, and 3+ comorbidities:    Abdominal hysterectomy 1978; right breast cancer estrogen receptor negative; lumbar laminectomy are also affecting patient's functional outcome.    REHAB POTENTIAL: Excellent   CLINICAL DECISION MAKING:  Evolving/moderate complexity   EVALUATION COMPLEXITY: Moderate     GOALS: Goals reviewed with patient? Yes   SHORT TERM GOALS: Target date: 05/05/2022   Patient is independent with pelvic drop and diaphragmatic breathing to relax her pelvic floor.  Baseline: Goal status: Met 05/19/2022   2.  Patient understands how to massage the external perineal area to relax the pelvic floor.  Baseline:  Goal status: Met 05/19/2022   3.  Patient able to have the Q-tip inserted into the vaginal canal without pain.  Baseline:  Goal status: Met 05/19/2022   4.  Patient is using the vaginal vibrator on the tissue to improve blood flow to the tissue and relax the tissue.  Baseline:  Goal status: Met 05/26/2022     LONG TERM GOALS: Target date: 06/30/2022    Patient is independent with using the vaginal dilators and on the largest one with pain </= 1/10.  Baseline:  Goal status: Met 08/02/2022   2.  Patient is able to have penile penetration with pain level </= 1/10 and possible with the assistance of the [place].  Baseline: going to start using the 3rd dilator Goal status: Met 09/10/22   3.  Patient reports her stool leakage has improved >/= 75% due to improved strength and coordination of the pelvic floor.  Baseline: happens after she stains and liquid comes out Goal status: Met 09/10/22   4.  Patient reports she is able to hold her urine when she has the urge to urinate and walk to the bathroom.  Baseline: hold urine before she can sit on the commode most of the time Goal status: Met 09/10/22   5.  Patient reports her urinary leakage with coughing, sneezing, exercise and lifting improved >/= 75% due to the improved mobility of the pelvic floor muscles.  Baseline: no change at this time Goal status: Met 09/10/22       PLAN: PLAN  Discharge to HEP  Earlie Counts, PT 09/10/22 10:59 AM  PHYSICAL THERAPY DISCHARGE SUMMARY  Visits from Start of Care: 13  Current functional level related to  goals / functional outcomes: See above    Remaining deficits: See above.    Education / Equipment: HEP   Patient agrees to discharge. Patient goals were met. Patient is being discharged due to meeting the stated rehab goals. Thank you for the referral. Earlie Counts, PT 09/10/22 11:45 AM

## 2022-10-28 ENCOUNTER — Other Ambulatory Visit: Payer: Self-pay | Admitting: Family Medicine

## 2022-10-28 DIAGNOSIS — E782 Mixed hyperlipidemia: Secondary | ICD-10-CM

## 2022-12-30 ENCOUNTER — Other Ambulatory Visit: Payer: Self-pay | Admitting: Neurological Surgery

## 2022-12-30 DIAGNOSIS — M5136 Other intervertebral disc degeneration, lumbar region: Secondary | ICD-10-CM

## 2023-02-03 ENCOUNTER — Other Ambulatory Visit: Payer: Self-pay | Admitting: Neurological Surgery

## 2023-02-03 ENCOUNTER — Ambulatory Visit
Admission: RE | Admit: 2023-02-03 | Discharge: 2023-02-03 | Disposition: A | Discharge status: Home / Self Care | Payer: Medicare Other | Source: Ambulatory Visit | Attending: Neurological Surgery | Admitting: Neurological Surgery

## 2023-02-03 DIAGNOSIS — M5136 Other intervertebral disc degeneration, lumbar region: Secondary | ICD-10-CM

## 2023-02-26 ENCOUNTER — Encounter (HOSPITAL_COMMUNITY): Payer: Self-pay

## 2023-02-26 ENCOUNTER — Ambulatory Visit (HOSPITAL_COMMUNITY)
Admission: RE | Admit: 2023-02-26 | Discharge: 2023-02-26 | Disposition: A | Discharge status: Home / Self Care | Payer: Medicare Other | Source: Ambulatory Visit | Attending: Emergency Medicine | Admitting: Emergency Medicine

## 2023-02-26 VITALS — BP 118/76 | HR 61 | Temp 98.1°F | Resp 17

## 2023-02-26 DIAGNOSIS — S90562A Insect bite (nonvenomous), left ankle, initial encounter: Secondary | ICD-10-CM | POA: Diagnosis not present

## 2023-02-26 DIAGNOSIS — W57XXXA Bitten or stung by nonvenomous insect and other nonvenomous arthropods, initial encounter: Secondary | ICD-10-CM | POA: Diagnosis not present

## 2023-02-26 DIAGNOSIS — L2389 Allergic contact dermatitis due to other agents: Secondary | ICD-10-CM

## 2023-02-26 MED ORDER — TRIAMCINOLONE ACETONIDE 0.1 % EX CREA: 1.0000 | TOPICAL_CREAM | Freq: Two times a day (BID) | CUTANEOUS | 0 refills | Status: DC

## 2023-02-26 MED ORDER — DEXAMETHASONE SODIUM PHOSPHATE 10 MG/ML IJ SOLN
INTRAMUSCULAR | Status: AC
  Filled 2023-02-26: qty 1

## 2023-02-26 MED ORDER — DEXAMETHASONE SODIUM PHOSPHATE 10 MG/ML IJ SOLN
10.0000 mg | Freq: Once | INTRAMUSCULAR | Status: AC
  Administered 2023-02-26: 10 mg via INTRAMUSCULAR

## 2023-02-26 NOTE — ED Provider Notes (Signed)
[facility]    CSN: [id number] Arrival date & time: 02/26/23  1121      History   Chief Complaint Chief Complaint  Patient presents with   Insect Bite    Entered by patient    HPI ISMA PHILP is a 77 y.o. female.   Patient presents to clinic over concern of a bug bites to her bilateral inner ankles.  She was bitten or stung by some flying bugs that may have been wasp yesterday morning.  They got into her socks while she was walking outside.  After this happened she used a topical Benadryl stick.  Throughout the night she woke up itching and today her left ankle is swollen.  Denies fever. Bites at ankle only.   Her medical history is significant for hyperlipidemia, anemia, and A-fib. No hx of DM2.   The history is provided by the patient and medical records.    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   Family history of brain cancer    Family history of breast cancer    Family history of colon cancer    Fecal incontinence    05-16-2019 per pt only a little leakage but controllable (was in clinical trial for [facility] without benefit.)   Frequent headaches    History of syncope    pre-syncope  08/ 2015  per pt no issue with this since    Hyperlipidemia    Malignant neoplasm of upper-inner quadrant of right breast in female, estrogen receptor negative [facility]) oncologist-- dr Darnelle Catalan  dr moody   dx 08/ 2020--  invasive ductal carcinoma,  05-13-2019  s/p right breast lumpectomy    PAF (paroxysmal atrial fibrillation) (HCC) (05-16-2019   pt from [place], recently moved to [place] to be near daughter, has not established a cardiology yet--  previously seen by dr Kern Alberta cossu (last office note dated 02-28-2018 scanned in epic)---  echo 04-15-2014  ef 55-60%, G1DD mild AR without stenosis, RVSp   Personal history of radiation therapy    stopped 09/22/19    Rectal cyst    Thyroid goiter     pt ultrasound in epic 06-04-2014 , nodules, no bx done   Urinary incontinence, mixed     Patient Active Problem List   Diagnosis Date Noted   Palpitations 01/20/2021   Anemia, macrocytic 09/25/2019   Paroxysmal atrial fibrillation (HCC) 08/02/2019   Dizziness 08/02/2019   Lower extremity edema 08/02/2019   Genetic testing 05/16/2019   Malignant neoplasm of upper-inner quadrant of right breast in female, estrogen receptor negative (HCC) 05/07/2019   Family history of breast cancer    Family history of colon cancer    Family history of brain cancer    Hyperlipidemia 11/08/2018   Fecal incontinence 11/08/2018   Urinary, incontinence, stress female 11/08/2018    Past Surgical History:  Procedure Laterality Date   ABDOMINAL HYSTERECTOMY  1978   w/  RSO  and APPENDECTOMY   BREAST BIOPSY Right 08/2009   benign   BREAST LUMPECTOMY WITH RADIOACTIVE SEED AND SENTINEL LYMPH NODE BIOPSY Right 05/15/2019   Procedure: RIGHT BREAST LUMPECTOMY WITH RADIOACTIVE SEED AND SENTINEL LYMPH NODE MAPPING;  Surgeon: Harriette Bouillon, MD;  Location: [facility];  Service: General;  Laterality: Right;   BREAST SURGERY  2011   biospy of right breast   CATARACT EXTRACTION W/ INTRAOCULAR LENS  IMPLANT, BILATERAL  2004   COLONOSCOPY  11/21/2019   FLEXIBLE SIGMOIDOSCOPY N/A 05/18/2019   Procedure: FLEXIBLE SIGMOIDOSCOPY,  TRANSANAL EXCISION inclusion cyst;  Surgeon: Romie Levee, MD;  Location: [facility];  Service: General;  Laterality: N/A;   gluteus medius repair  2017   LUMBAR LAMINECTOMY  2013   L5 -- S1   PORT-A-CATH REMOVAL Right 09/27/2019   Procedure: PORT REMOVAL;  Surgeon: Harriette Bouillon, MD;  Location: [facility];  Service: General;  Laterality: Right;   PORTACATH PLACEMENT Right 05/15/2019   Procedure: INSERTION PORT-A-CATH WITH ULTRASOUND;  Surgeon: Harriette Bouillon, MD;  Location: [facility];  Service: General;  Laterality: Right;    ROTATOR CUFF REPAIR Left 2001    OB History     Gravida  1   Para      Term      Preterm      AB      Living  1      SAB      IAB      Ectopic      Multiple      Live Births  1            Home Medications    Prior to Admission medications   Medication Sig Start Date End Date Taking? Authorizing Provider  triamcinolone cream (KENALOG) 0.1 % Apply 1 Application topically 2 (two) times daily. 02/26/23  Yes Rinaldo Ratel, [place] N, FNP  albuterol (VENTOLIN HFA) 108 (90 Base) MCG/ACT inhaler Inhale 2 puffs into the lungs every 6 (six) hours as needed for wheezing or shortness of breath. 08/09/22   Karie Georges, MD  CALCIUM-VITAMIN D PO Take 2 capsules by mouth daily. Vit D 1000 with minerals    [provider]  carboxymethylcellulose (REFRESH PLUS) 0.5 % SOLN 1 drop as needed.    [provider]  Cyanocobalamin (VITAMIN B 12 PO) Take 1,000 mg by mouth daily. sublingal    [provider]  estradiol (ESTRACE) 0.1 MG/GM vaginal cream Place 1 Applicatorful vaginally at bedtime.    [provider]  metoprolol tartrate (LOPRESSOR) 25 MG tablet TAKE 1/2 (ONE-HALF) TABLET BY MOUTH TWICE DAILY 08/05/22   Karie Georges, MD  simvastatin (ZOCOR) 40 MG tablet Take 1 tablet by mouth once daily 10/28/22   Karie Georges, MD    Family History Family History  Problem Relation Age of Onset   COPD Mother    Cancer Mother 4       colon   Arthritis Mother    Hypertension Mother    Miscarriages / [place] Mother    Stroke Mother 31   Alzheimer's disease Mother 86   Heart disease Father    Heart attack Father 72   Lung disease Father    Arthritis Sister    Breast cancer Sister 56       Lumpectomy   Diabetes Maternal Grandmother    Breast cancer Niece 20       Louise's Daughter   Cervical cancer Niece 56    Social History Social History   Tobacco Use   Smoking status: Never   Smokeless tobacco: Never  Vaping Use   Vaping  Use: Never used  Substance Use Topics   Alcohol use: Yes    Comment: Cocktails-3 days per week   Drug use: Never    Types: Amphetamines     Allergies   Penicillins and Sulfa antibiotics   Review of Systems Review of Systems  Constitutional:  Negative for fever.  Skin:  Positive for rash.     Physical Exam Triage Vital Signs ED Triage Vitals  Enc Vitals Group     BP 02/26/23 1141 118/76     Pulse Rate 02/26/23 1141 61     Resp 02/26/23 1141 17     Temp 02/26/23 1141 98.1 F (36.7 C)     Temp Source 02/26/23 1141 Oral     SpO2 02/26/23 1141 98 %     Weight --      Height --      Head Circumference --      Peak Flow --      Pain Score 02/26/23 1140 4     Pain Loc --      Pain Edu? --      Excl. in GC? --    No data found.  Updated Vital Signs BP 118/76 (BP Location: Left Arm)   Pulse 61   Temp 98.1 F (36.7 C) (Oral)   Resp 17   SpO2 98%   Visual Acuity Right Eye Distance:   Left Eye Distance:   Bilateral Distance:    Right Eye Near:   Left Eye Near:    Bilateral Near:     Physical Exam Vitals and nursing note reviewed.  Constitutional:      Appearance: Normal appearance.  HENT:     Head: Normocephalic and atraumatic.     Right Ear: External ear normal.     Left Ear: External ear normal.     Nose: Nose normal.     Mouth/Throat:     Mouth: Mucous membranes are moist.  Eyes:     Conjunctiva/sclera: Conjunctivae normal.  Cardiovascular:     Rate and Rhythm: Normal rate.     Pulses:          Dorsalis pedis pulses are 2+ on the right side and 2+ on the left side.       Posterior tibial pulses are 2+ on the right side and 2+ on the left side.  Pulmonary:     Effort: Pulmonary effort is normal. No respiratory distress.  Musculoskeletal:        General: Normal range of motion.  Skin:    General: Skin is warm and dry.     Capillary Refill: Capillary refill takes less than 2 seconds.     Findings: Rash present.  Neurological:     General: No  focal deficit present.     Mental Status: She is alert and oriented to person, place, and time.  Psychiatric:        Mood and Affect: Mood normal.        Behavior: Behavior is cooperative.      UC Treatments / Results  Labs (all labs ordered are listed, but only abnormal results are displayed) Labs Reviewed - No data to display  EKG   Radiology No results found.  Procedures Procedures (including critical care time)  Medications Ordered in UC Medications  dexamethasone (DECADRON) injection 10 mg (has no administration in time range)    Initial Impression / Assessment and Plan / UC Course  I have reviewed the triage vital signs and the nursing notes.  Pertinent labs & imaging results that were available during my care of the patient were reviewed by me and considered in my medical decision making (see chart for details).  Vitals and triage reviewed, patient is hemodynamically stable.  Insect bites causing itching, swelling and discomfort to bilateral inner ankles with  the left medial ankle being more swollen than the right.  No streaking, fevers or swelling that extends up to the calf, low concern for cellulitis or erysipelas at this point.  Will give triamcinolone cream and IM steroid for itching and inflammation.  Advised symptomatic management.  Plan of care, follow-up care and return precautions given, no questions at this time.     Final Clinical Impressions(s) / UC Diagnoses   Final diagnoses:  Insect bite of left ankle, initial encounter  Allergic contact dermatitis due to other agents     Discharge Instructions      We have given you a steroid injection in clinic to help with the pain and swelling.  You can use the triamcinolone cream twice daily to help with the itching and inflammation as well.  Do not use this for longer than 7 days as it can cause skin thinning.  If needed, you can take 25 mg of Benadryl every 6-8 hours to help with the itching sensation,  this may cause drowsiness.  Please return to clinic if these interventions do not help your symptoms, you develop fever, spreading of your redness and swelling over the next 3 days, or any new concerning symptoms.       ED Prescriptions     Medication Sig Dispense Auth. Provider   triamcinolone cream (KENALOG) 0.1 % Apply 1 Application topically 2 (two) times daily. 30 g Averyana Pillars, [place], [place]      PDMP not reviewed this encounter.   Jayvin Hurrell, [place] N, [place] 02/26/23 1202

## 2023-02-26 NOTE — Discharge Instructions (Addendum)
We have given you a steroid injection in clinic to help with the pain and swelling.  You can use the triamcinolone cream twice daily to help with the itching and inflammation as well.  Do not use this for longer than 7 days as it can cause skin thinning.  If needed, you can take 25 mg of Benadryl every 6-8 hours to help with the itching sensation, this may cause drowsiness.  Please return to clinic if these interventions do not help your symptoms, you develop fever, spreading of your redness and swelling over the next 3 days, or any new concerning symptoms.

## 2023-02-26 NOTE — ED Triage Notes (Signed)
Pt got bit by wasp looking insects yesterday that got in her socks on left posterior foot/ankle area. Reports swelling, redness, and pain.

## 2023-03-03 ENCOUNTER — Other Ambulatory Visit: Payer: Self-pay | Admitting: Hematology and Oncology

## 2023-03-03 DIAGNOSIS — Z1231 Encounter for screening mammogram for malignant neoplasm of breast: Secondary | ICD-10-CM

## 2023-03-07 ENCOUNTER — Other Ambulatory Visit: Payer: Self-pay | Admitting: Hematology and Oncology

## 2023-03-07 DIAGNOSIS — Z853 Personal history of malignant neoplasm of breast: Secondary | ICD-10-CM

## 2023-04-05 ENCOUNTER — Encounter (HOSPITAL_COMMUNITY): Payer: Self-pay | Admitting: Emergency Medicine

## 2023-04-05 ENCOUNTER — Other Ambulatory Visit: Payer: Self-pay | Admitting: Optometry

## 2023-04-05 ENCOUNTER — Other Ambulatory Visit: Payer: Self-pay

## 2023-04-05 ENCOUNTER — Ambulatory Visit (HOSPITAL_COMMUNITY)
Admission: EM | Admit: 2023-04-05 | Discharge: 2023-04-05 | Disposition: A | Discharge status: Home / Self Care | Payer: Medicare Other | Attending: Internal Medicine | Admitting: Internal Medicine

## 2023-04-05 DIAGNOSIS — H5711 Ocular pain, right eye: Secondary | ICD-10-CM

## 2023-04-05 DIAGNOSIS — H539 Unspecified visual disturbance: Secondary | ICD-10-CM | POA: Diagnosis not present

## 2023-04-05 DIAGNOSIS — G453 Amaurosis fugax: Secondary | ICD-10-CM

## 2023-04-05 NOTE — ED Triage Notes (Signed)
Pt states she is having intermittent right eye pain that she describes as sharp pain for few min with visual change and then return to normal since Saturday. Denies any pain at this time.

## 2023-04-05 NOTE — ED Provider Notes (Signed)
[facility]    CSN: [id number] Arrival date & time: 04/05/23  0807      History   Chief Complaint Chief Complaint  Patient presents with   Eye Pain    Pt states she is having intermittent right eye pain that she describes as sharp pain for few min with visual change and then return to normal since Saturday. Denies any pain at this time.    HPI Joanne Hansen is a 77 y.o. female who presents with intermittent R eye pain that is described as sharp for a few minutes with vision changes, then calms down, and comes back x 3 days. Denies pain right now. Had upper area of vision loss 2 days ago with pain first and this lasted 5 minutes. Then this am she had another episode of R upper eye pain which lasted longer, but no vision loss. She has not contacted her eye MD. She denies pain or vision changes right now.    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   Family history of brain cancer    Family history of breast cancer    Family history of colon cancer    Fecal incontinence    05-16-2019 per pt only a little leakage but controllable (was in clinical trial for [facility] without benefit.)   Frequent headaches    History of syncope    pre-syncope  08/ 2015  per pt no issue with this since    Hyperlipidemia    Malignant neoplasm of upper-inner quadrant of right breast in female, estrogen receptor negative [facility]) oncologist-- dr Darnelle Catalan  dr moody   dx 08/ 2020--  invasive ductal carcinoma,  05-13-2019  s/p right breast lumpectomy    PAF (paroxysmal atrial fibrillation) (HCC) (05-16-2019   pt from [place], recently moved to [place] to be near daughter, has not established a cardiology yet--  previously seen by dr Kern Alberta cossu (last office note dated 02-28-2018 scanned in epic)---  echo 04-15-2014  ef 55-60%, G1DD mild AR without stenosis, RVSp   Personal history of radiation therapy    stopped  09/22/19    Rectal cyst    Thyroid goiter    pt ultrasound in epic 06-04-2014 , nodules, no bx done   Urinary incontinence, mixed     Patient Active Problem List   Diagnosis Date Noted   Palpitations 01/20/2021   Anemia, macrocytic 09/25/2019   Paroxysmal atrial fibrillation (HCC) 08/02/2019   Dizziness 08/02/2019   Lower extremity edema 08/02/2019   Genetic testing 05/16/2019   Malignant neoplasm of upper-inner quadrant of right breast in female, estrogen receptor negative (HCC) 05/07/2019   Family history of breast cancer    Family history of colon cancer    Family history of brain cancer    Hyperlipidemia 11/08/2018   Fecal incontinence 11/08/2018   Urinary, incontinence, stress female 11/08/2018    Past Surgical History:  Procedure Laterality Date   ABDOMINAL HYSTERECTOMY  1978   w/  RSO  and APPENDECTOMY   BREAST BIOPSY Right 08/2009   benign   BREAST LUMPECTOMY WITH RADIOACTIVE SEED AND SENTINEL LYMPH NODE BIOPSY Right 05/15/2019   Procedure: RIGHT BREAST LUMPECTOMY WITH RADIOACTIVE SEED AND SENTINEL LYMPH NODE MAPPING;  Surgeon: Harriette Bouillon, MD;  Location: [facility];  Service: General;  Laterality: Right;   BREAST SURGERY  2011   biospy of right breast  CATARACT EXTRACTION W/ INTRAOCULAR LENS  IMPLANT, BILATERAL  2004   COLONOSCOPY  11/21/2019   FLEXIBLE SIGMOIDOSCOPY N/A 05/18/2019   Procedure: FLEXIBLE SIGMOIDOSCOPY,  TRANSANAL EXCISION inclusion cyst;  Surgeon: Romie Levee, MD;  Location: [facility];  Service: General;  Laterality: N/A;   gluteus medius repair  2017   LUMBAR LAMINECTOMY  2013   L5 -- S1   PORT-A-CATH REMOVAL Right 09/27/2019   Procedure: PORT REMOVAL;  Surgeon: Harriette Bouillon, MD;  Location: [facility];  Service: General;  Laterality: Right;   PORTACATH PLACEMENT Right 05/15/2019   Procedure: INSERTION PORT-A-CATH WITH ULTRASOUND;  Surgeon: Harriette Bouillon, MD;  Location: [facility];  Service: General;  Laterality: Right;   ROTATOR CUFF REPAIR Left 2001    OB History     Gravida  1   Para      Term      Preterm      AB      Living  1      SAB      IAB      Ectopic      Multiple      Live Births  1            Home Medications    Prior to Admission medications   Medication Sig Start Date End Date Taking? Authorizing Provider  albuterol (VENTOLIN HFA) 108 (90 Base) MCG/ACT inhaler Inhale 2 puffs into the lungs every 6 (six) hours as needed for wheezing or shortness of breath. 08/09/22   Karie Georges, MD  CALCIUM-VITAMIN D PO Take 2 capsules by mouth daily. Vit D 1000 with minerals    [provider]  carboxymethylcellulose (REFRESH PLUS) 0.5 % SOLN 1 drop as needed.    [provider]  Cyanocobalamin (VITAMIN B 12 PO) Take 1,000 mg by mouth daily. sublingal    [provider]  estradiol (ESTRACE) 0.1 MG/GM vaginal cream Place 1 Applicatorful vaginally at bedtime.    [provider]  metoprolol tartrate (LOPRESSOR) 25 MG tablet TAKE 1/2 (ONE-HALF) TABLET BY MOUTH TWICE DAILY 08/05/22   Karie Georges, MD  simvastatin (ZOCOR) 40 MG tablet Take 1 tablet by mouth once daily 10/28/22   Karie Georges, MD  triamcinolone cream (KENALOG) 0.1 % Apply 1 Application topically 2 (two) times daily. 02/26/23   Garrison, [place] N, FNP    Family History Family History  Problem Relation Age of Onset   COPD Mother    Cancer Mother 34       colon   Arthritis Mother    Hypertension Mother    Miscarriages / [place] Mother    Stroke Mother 25   Alzheimer's disease Mother 75   Heart disease Father    Heart attack Father 84   Lung disease Father    Arthritis Sister    Breast cancer Sister 62       Lumpectomy   Diabetes Maternal Grandmother    Breast cancer Niece 79       Louise's Daughter   Cervical cancer Niece 29    Social History Social History   Tobacco Use   Smoking status: Never    Smokeless tobacco: Never  Vaping Use   Vaping status: Never Used  Substance Use Topics   Alcohol use: Yes    Comment: Cocktails-3 days per week   Drug use: Never    Types: Amphetamines     Allergies   Penicillins and Sulfa antibiotics  Review of Systems Review of Systems As noted in HPI  Physical Exam Triage Vital Signs ED Triage Vitals  Encounter Vitals Group     BP 04/05/23 0828 135/70     Systolic BP Percentile --      Diastolic BP Percentile --      Pulse Rate 04/05/23 0828 66     Resp 04/05/23 0828 18     Temp 04/05/23 0828 98.1 F (36.7 C)     Temp Source 04/05/23 0828 Oral     SpO2 04/05/23 0828 98 %     Weight 04/05/23 0829 117 lb (53.1 kg)     Height 04/05/23 0829 5\' 2"  (1.575 m)     Head Circumference --      Peak Flow --      Pain Score 04/05/23 0829 0     Pain Loc --      Pain Education --      Exclude from Growth Chart --    No data found.  Updated Vital Signs BP 135/70 (BP Location: Right Arm)   Pulse 66   Temp 98.1 F (36.7 C) (Oral)   Resp 18   Ht 5\' 2"  (1.575 m)   Wt 117 lb (53.1 kg)   SpO2 98%   BMI 21.40 kg/m   Visual Acuity Right Eye Distance: 20/20 Left Eye Distance: 20/20 Bilateral Distance: 20/13-1  Right Eye Near:   Left Eye Near:    Bilateral Near:     Physical Exam Vitals and nursing note reviewed.  Constitutional:      General: She is not in acute distress.    Appearance: She is normal weight. She is not toxic-appearing.  Eyes:     General: Lids are normal. Vision grossly intact. No visual field deficit or scleral icterus.    Extraocular Movements: Extraocular movements intact.     Right eye: No nystagmus.     Conjunctiva/sclera:     Right eye: Right conjunctiva is not injected.  Neurological:     Mental Status: She is alert.      UC Treatments / Results  Labs (all labs ordered are listed, but only abnormal results are displayed) Labs Reviewed - No data to display  EKG   Radiology No results  found.  Procedures Procedures (including critical care time)  Medications Ordered in UC Medications - No data to display  Initial Impression / Assessment and Plan / UC Course  I have reviewed the triage vital signs and the nursing notes. I called Dr Dellia Nims office and they are able to work her in right now. Pt was sent there for further evaluation.    Final Clinical Impressions(s) / UC Diagnoses   Final diagnoses:  Pain of right eye  Vision changes     Discharge Instructions      Go to see your eye doctor right now at 9:15 am [address] [place],  [place], [place] [address]          ED Prescriptions   None    PDMP not reviewed this encounter.   Garey Ham, [place] 04/05/23 [phone]

## 2023-04-05 NOTE — Discharge Instructions (Addendum)
Go to see your eye doctor right now at 9:15 am [address] [place],  [place], [place] [address]

## 2023-04-08 ENCOUNTER — Ambulatory Visit
Admission: RE | Admit: 2023-04-08 | Discharge: 2023-04-08 | Disposition: A | Discharge status: Home / Self Care | Payer: Medicare Other | Source: Ambulatory Visit | Attending: Optometry | Admitting: Optometry

## 2023-04-08 DIAGNOSIS — G453 Amaurosis fugax: Secondary | ICD-10-CM

## 2023-04-12 ENCOUNTER — Encounter: Payer: Self-pay | Admitting: Optometry

## 2023-04-27 ENCOUNTER — Other Ambulatory Visit: Payer: Self-pay | Admitting: Family Medicine

## 2023-04-27 DIAGNOSIS — E782 Mixed hyperlipidemia: Secondary | ICD-10-CM

## 2023-05-02 ENCOUNTER — Ambulatory Visit
Admission: RE | Admit: 2023-05-02 | Discharge: 2023-05-02 | Disposition: A | Discharge status: Home / Self Care | Payer: Medicare Other | Source: Ambulatory Visit | Attending: Hematology and Oncology | Admitting: Hematology and Oncology

## 2023-05-02 ENCOUNTER — Ambulatory Visit: Payer: Medicare Other

## 2023-05-02 DIAGNOSIS — Z853 Personal history of malignant neoplasm of breast: Secondary | ICD-10-CM

## 2023-05-04 NOTE — Progress Notes (Signed)
Cardiology Office Note:  .   Date:  05/09/2023  ID:  ARIN CLOWER, DOB December 10, 1945, MRN [id number] PCP: Karie Georges, MD  [facility] Providers Cardiologist:  Nanetta Batty, MD  History of Present Illness: .   DECLAN KINSTLER is a 77 y.o. female with a past medical history of paroxysmal atrial fibrillation, HLD. Patient is followed by Dr. Allyson Sabal and presents today for an annual follow up appointment   Part chart review patient establish care with Dr. Allyson Sabal in 07/2019 after she moved from [place] to [place] to be closer to her family.  Patient was referred to cardiology by her PCP for evaluation of dizziness and a history of remote PAF.  Patient does have a history of hyperlipidemia and has been on statin therapy.  Her father did have a MI at age 68 and required bypass surgery.  Patient denies history of tobacco use.  Denies history of MI or stroke.  She was diagnosed with breast cancer in 04/2019 and underwent lumpectomy with subsequent chemotherapy and radiation.  Patient had an echocardiogram on 08/15/2019 that showed EF 60-65%, no LVH, no regional wall motion abnormalities, normal RV systolic function, mild-moderate aortic valve regurgitation.  She wore a cardiac monitor in 08/2019 that showed sinus rhythm with short runs of PSVT.  Patient had her last radiation treatment for her breast cancer in 08/2019.  Patient was last seen by Dr. Allyson Sabal on 05/05/2022.  At that time, patient was doing well.  She was able to walk several miles a day and was completely asymptomatic.  Occasionally had palpitations.  Remained on metoprolol tartrate, simvastatin  On 04/05/2023, patient developed pain near her right eye.  She also complained of an upper area of vision loss with pain.  She went to urgent care on 8/13 who instructed her to go to her eye doctor.  Reportedly, her eye doctor could not find any abnormality.  However, there was concern of possible TIA.  She underwent carotid ultrasounds on  8/16 that showed no significant stenosis of internal carotid arteries   Today, patient reports that she has been doing well from a cardiac perspective.  She denies chest pain, shortness of breath, orthopnea, ankle edema, syncope, near syncope, dizziness.  She occasionally has palpitations/fluttering in her chest.  The symptoms usually occur when she is laying in bed at night.  Reports that back when she lived in [place], she was treated for paroxysmal atrial fibrillation.  However, has not been on blood thinners or A-fib treatment since moving to [place]. Reports that while she was not formally diagnosed with a TIA, her eye doctor mentioned that it was a possibility   ROS: Denies chest pain, syncope, near syncope, dizziness, ankle edema. Occasionally has palpitations   Studies Reviewed: .   Cardiac Studies & Procedures       ECHOCARDIOGRAM  ECHOCARDIOGRAM COMPLETE 08/15/2019  Narrative ECHOCARDIOGRAM REPORT    Patient Name:   DOTTY BOATRIGHT Date of Exam: 08/15/2019 Medical Rec #:  [id number]         Height:       62.2 in Accession #:    [id number]        Weight:       132.6 lb Date of Birth:  04-14-1946         BSA:          1.61 m Patient Age:    73 years          BP:  114/69 mmHg Patient Gender: F                 HR:           91 bpm. Exam Location:  [address]  Procedure: 2D Echo, Cardiac Doppler and Color Doppler  Indications:    R06.02 SOB; R60.9 Swelling  History:        Patient has no prior history of Echocardiogram examinations. Arrythmias:Atrial Fibrillation; Risk Factors:Dyslipidemia, Family History of Coronary Artery Disease and Non-Smoker. Fatigue. Dizziness. Tachypalpitations. Right breast cancer. S/P lumpectomy.  Sonographer:    Cathie Beams RCS Referring Phys: [phone] JONATHAN J BERRY  IMPRESSIONS   1. Left ventricular ejection fraction, by visual estimation, is 60 to 65%. The left ventricle has normal function. There is no left  ventricular hypertrophy. 2. The left ventricle has no regional wall motion abnormalities. 3. Global right ventricle has normal systolic function.The right ventricular size is normal. No increase in right ventricular wall thickness. 4. Left atrial size was normal. 5. Right atrial size was normal. 6. Presence of pericardial fat pad. 7. Trivial pericardial effusion is present. 8. Mild mitral annular calcification. 9. The mitral valve is normal in structure. Trivial mitral valve regurgitation. No evidence of mitral stenosis. 10. The tricuspid valve is normal in structure. 11. Aortic valve regurgitation is mild to moderate. 12. The aortic valve is tricuspid. Aortic valve regurgitation is mild to moderate. No evidence of aortic valve sclerosis or stenosis. 13. The pulmonic valve was normal in structure. Pulmonic valve regurgitation is not visualized. 14. Normal pulmonary artery systolic pressure. 15. The inferior vena cava is normal in size with greater than 50% respiratory variability, suggesting right atrial pressure of 3 mmHg.  FINDINGS Left Ventricle: Left ventricular ejection fraction, by visual estimation, is 60 to 65%. The left ventricle has normal function. The left ventricle has no regional wall motion abnormalities. There is no left ventricular hypertrophy. Normal left atrial pressure.  Right Ventricle: The right ventricular size is normal. No increase in right ventricular wall thickness. Global RV systolic function is has normal systolic function. The tricuspid regurgitant velocity is 2.14 m/s, and with an assumed right atrial pressure of 3 mmHg, the estimated right ventricular systolic pressure is normal at 21.3 mmHg.  Left Atrium: Left atrial size was normal in size.  Right Atrium: Right atrial size was normal in size  Pericardium: Trivial pericardial effusion is present. Presence of pericardial fat pad.  Mitral Valve: The mitral valve is normal in structure. There is mild  thickening of the mitral valve leaflet(s). Mild mitral annular calcification. Trivial mitral valve regurgitation. No evidence of mitral valve stenosis by observation.  Tricuspid Valve: The tricuspid valve is normal in structure. Tricuspid valve regurgitation is trivial.  Aortic Valve: The aortic valve is tricuspid. Aortic valve regurgitation is mild to moderate. Aortic regurgitation PHT measures 341 msec. The aortic valve is structurally normal, with no evidence of sclerosis or stenosis.  Pulmonic Valve: The pulmonic valve was normal in structure. Pulmonic valve regurgitation is not visualized. Pulmonic regurgitation is not visualized.  Aorta: The aortic root, ascending aorta and aortic arch are all structurally normal, with no evidence of dilitation or obstruction.  Venous: The inferior vena cava is normal in size with greater than 50% respiratory variability, suggesting right atrial pressure of 3 mmHg.  IAS/Shunts: No atrial level shunt detected by color flow Doppler. There is no evidence of a patent foramen ovale. No ventricular septal defect is seen or detected. There is no evidence of  an atrial septal defect.   LEFT VENTRICLE PLAX 2D LVIDd:         3.30 cm  Diastology LVIDs:         2.10 cm  LV e' lateral:   6.20 cm/s LV PW:         0.90 cm  LV E/e' lateral: 10.7 LV IVS:        1.20 cm  LV e' medial:    6.31 cm/s LVOT diam:     1.95 cm  LV E/e' medial:  10.5 LV SV:         30 ml LV SV Index:   18.18 LVOT Area:     2.99 cm  3D Volume EF: 3D EF:        62 % LV EDV:       91 ml LV ESV:       34 ml LV SV:        57 ml  RIGHT VENTRICLE RV Basal diam:  2.10 cm RV S prime:     11.50 cm/s TAPSE (M-mode): 1.5 cm RVSP:           21.3 mmHg  LEFT ATRIUM             Index       RIGHT ATRIUM           Index LA diam:        2.10 cm 1.30 cm/m  RA Pressure: 3.00 mmHg LA Vol (A2C):   15.8 ml 9.82 ml/m  RA Area:     10.30 cm LA Vol (A4C):   25.9 ml 16.09 ml/m RA Volume:   19.70 ml   12.24 ml/m LA Biplane Vol: 20.3 ml 12.61 ml/m AORTIC VALVE LVOT Vmax:   149.00 cm/s LVOT Vmean:  86.800 cm/s LVOT VTI:    0.265 m AI PHT:      341 msec  AORTA Ao Root diam: 3.40 cm  MITRAL VALVE                        TRICUSPID VALVE MV Area (PHT): 4.21 cm             TR Peak grad:   18.3 mmHg MV PHT:        52.20 msec           TR Vmax:        214.00 cm/s MV Decel Time: 180 msec             Estimated RAP:  3.00 mmHg MV E velocity: 66.50 cm/s 103 cm/s  RVSP:           21.3 mmHg MV A velocity: 75.70 cm/s 70.3 cm/s MV E/A ratio:  0.88       1.5       SHUNTS Systemic VTI:  0.26 m Systemic Diam: 1.95 cm   Jodelle Red MD Electronically signed by Jodelle Red MD Signature Date/Time: 08/15/2019/4:09:09 PM    Final    MONITORS  CARDIAC EVENT MONITOR 09/12/2019  Narrative 1: Sinus rhythm/sinus tachycardia/sinus bradycardia 2: Short runs of PSVT           Risk Assessment/Calculations:             Physical Exam:   VS:  BP 122/70   Pulse (!) 59   Ht 5\' 2"  (1.575 m)   Wt 120 lb (54.4 kg)   BMI 21.95 kg/m    Wt Readings from Last 3 Encounters:  05/09/23 120 lb (54.4 kg)  04/05/23 117 lb (53.1 kg)  08/05/22 116 lb (52.6 kg)    GEN: Well nourished, well developed in no acute distress. Sitting comfortably on the exam table NECK: No JVD; No carotid bruits CARDIAC: RRR, no murmurs, rubs, gallops. Radial pulses 2+ bilaterally  RESPIRATORY:  Clear to auscultation without rales, wheezing or rhonchi. Normal work of breathing on room air   ABDOMEN: Soft, non-tender, non-distended EXTREMITIES:  No edema in BLE; No deformity   ASSESSMENT AND PLAN: .    Possible TIA  - Patient developed pain near her right eye with vision changes in August- was seen in an urgent care and later by her eye doctor who mentioned possible TIA  - Carotid ultrasounds 04/08/23 were without significant stenosis of internal carotid arteries  - Ordered echocardiogram  - Ordered 14  day zio  History of PAF  - Reportedly found to have PAF on a cardiac monitor in [place] in 2014. Cardiac monitor in our system from 08/2019 showed sinus, brief episodes of PSVT. No afib  - EKG today shows sinus bradycardia  - Patient does admit to having occasional episodes of palpitations, usually when she is laying in bed at night  - Continue metoprolol tartrate 12.5 mg BID  -Given patient's possible TIA, complaint of palpitations, ordered 2-week ZIO to rule out recurrence of A-fib  HLD  - Lipid panel from 07/2022 showed LDL 59  - Continue simvastatin 40 mg daily   Mild-moderate Aortic Regurgitation - Noted on echocardiogram from 07/2019   - Patient is euvolemic and does not require diuretics at this time  - Ordered echocardiogram as above    Dispo: Follow up in 1 year   Signed, Jonita Albee, PA-C

## 2023-05-09 ENCOUNTER — Encounter: Payer: Self-pay | Admitting: Cardiology

## 2023-05-09 ENCOUNTER — Other Ambulatory Visit: Payer: Self-pay | Admitting: Cardiology

## 2023-05-09 ENCOUNTER — Ambulatory Visit: Payer: Medicare Other | Attending: Cardiology

## 2023-05-09 ENCOUNTER — Ambulatory Visit: Payer: Medicare Other | Attending: Cardiology | Admitting: Cardiology

## 2023-05-09 VITALS — BP 122/70 | HR 59 | Ht 62.0 in | Wt 120.0 lb

## 2023-05-09 DIAGNOSIS — I351 Nonrheumatic aortic (valve) insufficiency: Secondary | ICD-10-CM | POA: Insufficient documentation

## 2023-05-09 DIAGNOSIS — I480 Paroxysmal atrial fibrillation: Secondary | ICD-10-CM | POA: Diagnosis present

## 2023-05-09 DIAGNOSIS — G459 Transient cerebral ischemic attack, unspecified: Secondary | ICD-10-CM | POA: Diagnosis not present

## 2023-05-09 DIAGNOSIS — E782 Mixed hyperlipidemia: Secondary | ICD-10-CM | POA: Diagnosis present

## 2023-05-09 DIAGNOSIS — I471 Supraventricular tachycardia, unspecified: Secondary | ICD-10-CM

## 2023-05-09 NOTE — Progress Notes (Unsigned)
Enrolled for [employer] to mail a ZIO XT long term holter monitor to the patients address on file.   Dr. Allyson Sabal to read.

## 2023-05-09 NOTE — Patient Instructions (Signed)
Medication Instructions:  The current medical regimen is effective;  continue present plan and medications as directed. Please refer to the Current Medication list given to you today.  *If you need a refill on your cardiac medications before your next appointment, please call your pharmacy*  Lab Work: NONE If you have labs (blood work) drawn today and your tests are completely normal, you will receive your results only by: MyChart Message (if you have MyChart) OR A paper copy in the mail If you have any lab test that is abnormal or we need to change your treatment, we will call you to review the results.  Testing/Procedures: Your physician has requested that you have an echocardiogram. Echocardiography is a painless test that uses sound waves to create images of your heart. It provides your doctor with information about the size and shape of your heart and how well your heart's chambers and valves are working. This procedure takes approximately one hour. There are no restrictions for this procedure. Please do NOT wear cologne, perfume, aftershave, or lotions (deodorant is allowed). Please arrive 15 minutes prior to your appointment time.   Your physician has requested you wear a ZIO patch monitor for 14 days. SEE BELOW  Follow-Up: At [facility], you and your health needs are our priority.  As part of our continuing mission to provide you with exceptional heart care, we have created designated Provider Care Teams.  These Care Teams include your primary Cardiologist (physician) and Advanced Practice Providers (APPs -  Physician Assistants and Nurse Practitioners) who all work together to provide you with the care you need, when you need it.  Your next appointment:   12 month(s)  Provider:   Nanetta Batty, MD  or Robet Leu, PA-C        ZIO XT- Long Term Monitor Instructions  Your physician has requested you wear a ZIO patch monitor for 14 days.  This is a single patch  monitor. [employer] supplies one patch monitor per enrollment. Additional stickers are not available. Please do not apply patch if you will be having a Nuclear Stress Test,  Echocardiogram, Cardiac CT, MRI, or Chest Xray during the period you would be wearing the  monitor. The patch cannot be worn during these tests. You cannot remove and re-apply the  ZIO XT patch monitor.  Your ZIO patch monitor will be mailed 3 day USPS to your address on file. It may take 3-5 days  to receive your monitor after you have been enrolled.  Once you have received your monitor, please review the enclosed instructions. Your monitor  has already been registered assigning a specific monitor serial # to you.  Billing and Patient Assistance Program Information  We have supplied [employer] with any of your insurance information on file for billing purposes. [employer] offers a sliding scale Patient Assistance Program for patients that do not have  insurance, or whose insurance does not completely cover the cost of the ZIO monitor.  You must apply for the Patient Assistance Program to qualify for this discounted rate.  To apply, please call [employer] at [phone], select option 4, select option 2, ask to apply for  Patient Assistance Program. Meredeth Ide will ask your household income, and how many people  are in your household. They will quote your out-of-pocket cost based on that information.  [employer] will also be able to set up a 55-month, interest-free payment plan if needed.  Applying the monitor   Shave hair from upper left chest.  Hold abrader disc by orange tab. Rub abrader in 40 strokes over the upper left chest as  indicated in your monitor instructions.  Clean area with 4 enclosed alcohol pads. Let dry.  Apply patch as indicated in monitor instructions. Patch will be placed under collarbone on left  side of chest with arrow pointing upward.  Rub patch adhesive wings for 2 minutes. Remove white label marked "1".  Remove the white  label marked "2". Rub patch adhesive wings for 2 additional minutes.  While looking in a mirror, press and release button in center of patch. A small green light will  flash 3-4 times. This will be your only indicator that the monitor has been turned on.  Do not shower for the first 24 hours. You may shower after the first 24 hours.  Press the button if you feel a symptom. You will hear a small click. Record Date, Time and  Symptom in the Patient Logbook.  When you are ready to remove the patch, follow instructions on the last 2 pages of Patient  Logbook. Stick patch monitor onto the last page of Patient Logbook.  Place Patient Logbook in the blue and white box. Use locking tab on box and tape box closed  securely. The blue and white box has prepaid postage on it. Please place it in the mailbox as  soon as possible. Your physician should have your test results approximately 7 days after the  monitor has been mailed back to [facility].  Call [facility] Customer Care at [phone] if you have questions regarding  your ZIO XT patch monitor. Call them immediately if you see an orange light blinking on your  monitor.  If your monitor falls off in less than 4 days, contact our Monitor department at [phone].  If your monitor becomes loose or falls off after 4 days call [employer] at [phone] for  suggestions on securing your monitor

## 2023-05-12 ENCOUNTER — Inpatient Hospital Stay: Payer: Medicare Other

## 2023-05-12 ENCOUNTER — Inpatient Hospital Stay: Payer: Medicare Other | Attending: Hematology and Oncology | Admitting: Hematology and Oncology

## 2023-05-12 VITALS — BP 111/54 | HR 59 | Temp 98.1°F | Resp 17 | Ht 62.0 in | Wt 120.5 lb

## 2023-05-12 DIAGNOSIS — Z853 Personal history of malignant neoplasm of breast: Secondary | ICD-10-CM | POA: Diagnosis present

## 2023-05-12 DIAGNOSIS — I480 Paroxysmal atrial fibrillation: Secondary | ICD-10-CM

## 2023-05-12 DIAGNOSIS — N952 Postmenopausal atrophic vaginitis: Secondary | ICD-10-CM | POA: Insufficient documentation

## 2023-05-12 DIAGNOSIS — C50211 Malignant neoplasm of upper-inner quadrant of right female breast: Secondary | ICD-10-CM

## 2023-05-12 DIAGNOSIS — Z171 Estrogen receptor negative status [ER-]: Secondary | ICD-10-CM

## 2023-05-12 DIAGNOSIS — G459 Transient cerebral ischemic attack, unspecified: Secondary | ICD-10-CM

## 2023-05-12 DIAGNOSIS — Z9221 Personal history of antineoplastic chemotherapy: Secondary | ICD-10-CM | POA: Insufficient documentation

## 2023-05-12 DIAGNOSIS — I471 Supraventricular tachycardia, unspecified: Secondary | ICD-10-CM

## 2023-05-12 DIAGNOSIS — Z923 Personal history of irradiation: Secondary | ICD-10-CM | POA: Insufficient documentation

## 2023-05-12 LAB — CBC WITH DIFFERENTIAL/PLATELET
Abs Immature Granulocytes: 0.01 10*3/uL (ref 0.00–0.07)
Basophils Absolute: 0 10*3/uL (ref 0.0–0.1)
Basophils Relative: 1 %
Eosinophils Absolute: 0.1 10*3/uL (ref 0.0–0.5)
Eosinophils Relative: 2 %
HCT: 39.7 % (ref 36.0–46.0)
Hemoglobin: 13 g/dL (ref 12.0–15.0)
Immature Granulocytes: 0 %
Lymphocytes Relative: 28 %
Lymphs Abs: 1.6 10*3/uL (ref 0.7–4.0)
MCH: 33.1 pg (ref 26.0–34.0)
MCHC: 32.7 g/dL (ref 30.0–36.0)
MCV: 101 fL — ABNORMAL HIGH (ref 80.0–100.0)
Monocytes Absolute: 0.6 10*3/uL (ref 0.1–1.0)
Monocytes Relative: 11 %
Neutro Abs: 3.4 10*3/uL (ref 1.7–7.7)
Neutrophils Relative %: 58 %
Platelets: 205 10*3/uL (ref 150–400)
RBC: 3.93 MIL/uL (ref 3.87–5.11)
RDW: 11.9 % (ref 11.5–15.5)
WBC: 5.7 10*3/uL (ref 4.0–10.5)
nRBC: 0 % (ref 0.0–0.2)

## 2023-05-12 LAB — CMP (CANCER CENTER ONLY)
ALT: 13 U/L (ref 0–44)
AST: 18 U/L (ref 15–41)
Albumin: 4.3 g/dL (ref 3.5–5.0)
Alkaline Phosphatase: 54 U/L (ref 38–126)
Anion gap: 5 (ref 5–15)
BUN: 16 mg/dL (ref 8–23)
CO2: 31 mmol/L (ref 22–32)
Calcium: 9.8 mg/dL (ref 8.9–10.3)
Chloride: 103 mmol/L (ref 98–111)
Creatinine: 0.68 mg/dL (ref 0.44–1.00)
GFR, Estimated: >60 mL/min (ref >60)
Glucose, Bld: 91 mg/dL (ref 70–99)
Potassium: 4 mmol/L (ref 3.5–5.1)
Sodium: 139 mmol/L (ref 135–145)
Total Bilirubin: 0.7 mg/dL (ref 0.3–1.2)
Total Protein: 7 g/dL (ref 6.5–8.1)

## 2023-05-12 NOTE — Progress Notes (Signed)
[facility]  [phone] [phone]    ID: Joanne Hansen DOB: 1946-05-09  MR#: [id number]  JYN#:[id number]  Patient Care Team: Karie Georges, MD as PCP - General (Family Medicine) Runell Gess, MD as PCP - Cardiology (Cardiology) Magrinat, Valentino Hue, MD (Inactive) as Consulting Physician (Oncology) Harriette Bouillon, MD as Consulting Physician (General Surgery) Dorothy Puffer, MD as Consulting Physician (Radiation Oncology) Romie Levee, MD as Consulting Physician (General Surgery) Alfredo Martinez, MD as Consulting Physician (Urology) Sheran Luz, MD as Consulting Physician (Physical Medicine and Rehabilitation) Zelphia Cairo, MD as Consulting Physician (Obstetrics and Gynecology)  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  History of Present Illness    The patient, with a history of back issues, presents for a routine follow-up. She reports recent cardiac symptoms, including occasional lightheadedness and severe pain above the eye. She has been evaluated by a cardiologist, who has ordered a two-week cardiac monitor and an echocardiogram. She has also undergone a carotid ultrasound, which was unremarkable.  The patient reports a low energy level but maintains an active lifestyle, walking two to three miles five times a week. She sleeps well and wakes up feeling refreshed. Her appetite is good, and she has not experienced any unintentional weight loss. She has, however, gained three pounds recently, which she attributes to normal fluctuations.  The patient has not required hospitalization since her last visit and has not experienced any changes in her  breathing, bowel movements, or urination. She denies any cough, chest pain, shortness of breath, blood in stool, black stool, or double vision.  The patient also reports a history of RSV infection last December, which was severe and resulted in fluid in her lungs. She has since received the RSV vaccine, as well as her flu shot and COVID vaccine. She also mentions a persistent rash in one area, which she plans to have evaluated by a dermatologist.  Lastly, the patient mentions a white discoloration on her toenails, which has been present for about a year.   Rest of the pertinent 10 point ROS reviewed and negative.  HISTORY OF CURRENT ILLNESS: From the original intake note:  Joanne Hansen had routine screening mammography on 04/10/2019 showing a possible abnormality in the right breast. She underwent unilateral right diagnostic mammography with tomography and right breast ultrasonography at [facility] on 04/12/2019 showing: Breast Density Category B. Spot compression views of the medial right breast, including tomography confirm an irregular mass spanning approximately 0.9 cm. On physical exam, no mass is palpated in the upper inner quadrant of the right breast. Targeted ultrasound is performed, showing a single irregular mass versus two immediately adjacent small irregular masses, that in total measure 1.1 cm. Individually, the two irregular masses measure 0.8 x 0.4 x 0.5 cm and 0.4 x 0.4 x 0.4 cm, respectively. Mass(es) are at 1 o'clock position 3 cm from the nipple. Ultrasound of the right axilla is negative for lymphadenopathy.  Accordingly on 04/16/2019 she proceeded to biopsy of the right breast area in question. The pathology from this procedure showed ([id number]): invasive ductal carcinoma, grade II. Prognostic indicators significant for: estrogen receptor, 0% negative and progesterone receptor, 0% negative,. Proliferation marker Ki67 at 20%. HER2 negative (1+) by  immunohistochemistry.  The patient's subsequent history is as detailed below.   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   Family history of brain cancer    Family history of breast cancer    Family history of colon cancer    Fecal incontinence    05-16-2019 per pt only a little leakage but controllable (was in clinical trial for [facility] without benefit.)   Frequent headaches    History of syncope    pre-syncope  08/ 2015  per pt no issue with this since    Hyperlipidemia    Malignant neoplasm of upper-inner quadrant of right breast in female, estrogen receptor negative [facility]) oncologist-- dr Darnelle Catalan  dr moody   dx 08/ 2020--  invasive ductal carcinoma,  05-13-2019  s/p right breast lumpectomy    PAF (paroxysmal atrial fibrillation) (HCC) (05-16-2019   pt from [place], recently moved to [place] to be near daughter, has not established a cardiology yet--  previously seen by dr Kern Alberta cossu (last office note dated 02-28-2018 scanned in epic)---  echo 04-15-2014  ef 55-60%, G1DD mild AR without stenosis, RVSp   Personal history of radiation therapy    stopped 09/22/19    Rectal cyst    Thyroid goiter    pt ultrasound in epic 06-04-2014 , nodules, no bx done   Urinary incontinence, mixed     PAST SURGICAL HISTORY: Past Surgical History:  Procedure Laterality Date   ABDOMINAL HYSTERECTOMY  1978   w/  RSO  and APPENDECTOMY   BREAST BIOPSY Right 08/2009   benign   BREAST LUMPECTOMY WITH RADIOACTIVE SEED AND SENTINEL LYMPH NODE BIOPSY Right 05/15/2019   Procedure: RIGHT BREAST LUMPECTOMY WITH RADIOACTIVE SEED AND SENTINEL LYMPH NODE MAPPING;  Surgeon: Harriette Bouillon, MD;  Location: [facility];  Service: General;  Laterality: Right;   BREAST SURGERY  2011   biospy of right breast   CATARACT EXTRACTION W/ INTRAOCULAR LENS  IMPLANT, BILATERAL  2004   COLONOSCOPY   11/21/2019   FLEXIBLE SIGMOIDOSCOPY N/A 05/18/2019   Procedure: FLEXIBLE SIGMOIDOSCOPY,  TRANSANAL EXCISION inclusion cyst;  Surgeon: Romie Levee, MD;  Location: [facility];  Service: General;  Laterality: N/A;   gluteus medius repair  2017   LUMBAR LAMINECTOMY  2013   L5 -- S1   PORT-A-CATH REMOVAL Right 09/27/2019   Procedure: PORT REMOVAL;  Surgeon: Harriette Bouillon, MD;  Location: [facility];  Service: General;  Laterality: Right;   PORTACATH PLACEMENT Right 05/15/2019   Procedure: INSERTION PORT-A-CATH WITH ULTRASOUND;  Surgeon: Harriette Bouillon, MD;  Location:  SURGERY CENTER;  Service: General;  Laterality: Right;   ROTATOR CUFF REPAIR Left 2001    FAMILY HISTORY: Family History  Problem Relation Age of Onset   COPD Mother    Cancer Mother 44       colon   Arthritis Mother    Hypertension Mother    Miscarriages / [place] Mother    Stroke Mother 51   Alzheimer's disease Mother 56   Heart disease Father    Heart attack Father 69   Lung disease Father    Arthritis Sister    Breast cancer Sister 41       Lumpectomy   Diabetes Maternal Grandmother    Breast cancer Niece 36       Louise's Daughter   Cervical cancer Niece 79  The patient's  father died at age 26 from a heart attack.  The patient's mother died at age 18; she had a gastrointestinal cancer diagnosed age 63.  The patient has 1 sister with breast cancer and one niece with breast cancer diagnosed at age 46.   GYNECOLOGIC HISTORY:  No LMP recorded. Patient has had a hysterectomy. 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  Her husband Josie Saunders (goes by "Fredia Sorrow") is a retired Art gallery manager.  Their daughter Erling Cruz (currently divorced)  lives in [place].  The patient has no grandchildren.  She is not a church attender   ADVANCED DIRECTIVES: The patient's husband is her healthcare power of attorney   HEALTH MAINTENANCE: Social History   Tobacco Use   Smoking status: Never   Smokeless tobacco: Never  Vaping Use   Vaping status: Never Used  Substance Use Topics   Alcohol use: Yes    Comment: Cocktails-3 days per week   Drug use: Never    Types: Amphetamines    Colonoscopy: March 2021/  PAP: Status post hysterectomy  Bone density: Pending   Allergies  Allergen Reactions   Penicillins Swelling   Sulfa Antibiotics Nausea And Vomiting     OBJECTIVE: white woman who appears well  Vitals:   05/12/23 1329  BP: (!) 111/54  Pulse: (!) 59  Resp: 17  Temp: 98.1 F (36.7 C)  SpO2: 100%     Wt Readings from Last 3 Encounters:  05/12/23 120 lb 8 oz (54.7 kg)  05/09/23 120 lb (54.4 kg)  04/05/23 117 lb (53.1 kg)   Body mass index is 22.04 kg/m.    ECOG FS:1 - Symptomatic but completely ambulatory   Sclerae unicteric, EOMs intact Right breast with significant post surgical changes,  post op likely organized seroma vs scar. No palpable lymphadenopathy   LAB RESULTS:  CMP     Component Value Date/Time   NA 139 05/12/2023 1400   K 4.0 05/12/2023 1400   CL 103 05/12/2023 1400   CO2 31 05/12/2023 1400   GLUCOSE 91 05/12/2023 1400   BUN 16 05/12/2023 1400   CREATININE 0.68 05/12/2023 1400   CALCIUM 9.8 05/12/2023 1400   PROT 7.0 05/12/2023 1400   PROT 6.6 02/06/2021 0845   ALBUMIN 4.3 05/12/2023 1400   ALBUMIN 4.7 02/06/2021 0845   AST 18 05/12/2023 1400   ALT 13 05/12/2023 1400   ALKPHOS 54 05/12/2023 1400   BILITOT 0.7 05/12/2023 1400   GFRNONAA >60 05/12/2023 1400   GFRAA >60 05/21/2020 1413   GFRAA >60 05/07/2019 1547    No results found for: "TOTALPROTELP", "ALBUMINELP", "A1GS", "A2GS", "BETS", "BETA2SER", "GAMS", "MSPIKE", "SPEI"  No results found for: "KPAFRELGTCHN",  "LAMBDASER", "KAPLAMBRATIO"  Lab Results  Component Value Date   WBC 5.7 05/12/2023   NEUTROABS 3.4 05/12/2023   HGB 13.0 05/12/2023   HCT 39.7 05/12/2023   MCV 101.0 (H) 05/12/2023   PLT 205 05/12/2023    No results found for: "LABCA2"  No components found for: "QIONGE952"  No results for input(s): "INR" in the last 168 hours.  No results found for: "LABCA2"  No results found for: "WUX324"  No results found for: "CAN125"  No results found for: "CAN153"  No results found for: "CA2729"  No components found for: "HGQUANT"  No results found for: "CEA1", "CEA" / No results found for: "CEA1", "CEA"   No results found for: "AFPTUMOR"  No results found for: "CHROMOGRNA"  No results found for: "HGBA", "HGBA2QUANT", "HGBFQUANT", "HGBSQUAN" (Hemoglobinopathy evaluation)   No results found for: "LDH"  Lab Results  Component Value Date   IRON 117 12/20/2019   TIBC 298 12/20/2019   IRONPCTSAT 39 12/20/2019   (Iron and TIBC)  Lab Results  Component Value Date   FERRITIN 92 12/20/2019    Urinalysis    Component Value Date/Time   BILIRUBINUR Negative 02/27/2020 1123   PROTEINUR Negative 02/27/2020 1123   UROBILINOGEN 0.2 02/27/2020 1123   NITRITE Negative 02/27/2020 1123   LEUKOCYTESUR Trace (A) 02/27/2020 1123    STUDIES:  MM 3D DIAGNOSTIC MAMMOGRAM BILATERAL BREAST  Result Date: 05/02/2023 CLINICAL DATA:  77 year old female presenting for routine annual surveillance status post right breast lumpectomy in 2020. EXAM: DIGITAL DIAGNOSTIC BILATERAL MAMMOGRAM WITH TOMOSYNTHESIS AND CAD TECHNIQUE: Bilateral digital diagnostic mammography and breast tomosynthesis was performed. The images were evaluated with computer-aided detection. COMPARISON:  Previous exam(s). ACR Breast Density Category b: There are scattered areas of fibroglandular density. FINDINGS: The right breast lumpectomy site is stable. No suspicious calcifications, masses or areas of distortion are seen in  the bilateral breasts. IMPRESSION: Stable right breast lumpectomy site. No mammographic evidence of malignancy in the bilateral breasts. RECOMMENDATION: Per protocol, as the patient is now 2 or more years status post lumpectomy, she may return to annual screening mammography in 1 year. However, given the history of breast cancer, the patient remains eligible for annual diagnostic mammography if preferred. I have discussed the findings and recommendations with the patient. If applicable, a reminder letter will be sent to the patient regarding the next appointment. BI-RADS CATEGORY  2: Benign. Electronically Signed   By: Frederico Hamman M.D.   On: 05/02/2023 11:54     ELIGIBLE FOR AVAILABLE RESEARCH PROTOCOL: NO   ASSESSMENT: 77 y.o. [place], [place] woman status post right breast upper quadrant biopsy for an mT1b (or single T1c)  invasive ductal carcinoma, grade 2, triple negative, with an MIB-1-1 of 20%.  (1) Genetic testing on 05/07/2019 through the [facility] Breast Cancer STAT Panel + Common Hereditary cancer panel found no pathogenic mutations. The STAT Breast cancer panel offered by [facility] includes sequencing and rearrangement analysis for the following 9 genes:  ATM, BRCA1, BRCA2, CDH1, CHEK2, PALB2, PTEN, STK11 and TP53.  The Common Hereditary Cancers Panel offered by [facility] includes sequencing and/or deletion duplication testing of the following 47 genes: APC, ATM, AXIN2, BARD1, BMPR1A, BRCA1, BRCA2, BRIP1, CDH1, CDKN2A (p14ARF), CDKN2A (p16INK4a), CKD4, CHEK2, CTNNA1, DICER1, EPCAM (Deletion/duplication testing only), GREM1 (promoter region deletion/duplication testing only), KIT, MEN1, MLH1, MSH2, MSH3, MSH6, MUTYH, NBN, NF1, NHTL1, PALB2, PDGFRA, PMS2, POLD1, POLE, PTEN, RAD50, RAD51C, RAD51D, SDHB, SDHC, SDHD, SMAD4, SMARCA4. STK11, TP53, TSC1, TSC2, and VHL.  The following genes were evaluated for sequence changes only: SDHA and HOXB13 c.251G>A variant only.  (2) s/p right lumpectomy and  sentinel lymph node sampling 05/15/2019 for a pT1b pN0 stage IB invasive ductal carcinoma, grade 3, with negative margins.  (a) a total of 2 sentinel lymph nodes were removed  (3) adjuvant chemotherapy consisting of cyclophosphamide and docetaxel every 21 days x 4, started 05/29/2019, completed 07/31/2019  (4) adjuvant radiation 08/27/2019 - 09/21/2019 The right breast was treated to 42.56 Gy in 16 fractions followed by an 8 Gy boost to the surgical cavity.  (5) opted against prophylactic antiestrogens  (  a) bone density 04/10/2020 shows a T score of -1.9  (6) severe vaginal atrophy with dyspareunia  (a) Estring prescribed 05/20/2021- not using.   PLAN:  Assessment and Plan  Breast cancer No evidence of recurrence Most recent mammogram with no concerns for malignancy.  Cardiac Concerns Recent episodes of lightheadedness and eye pain. Carotid ultrasound normal. Cardiologist to initiate two-week cardiac monitor and echocardiogram. -Continue follow-up with cardiologist as planned.  Vaginal Atrophy Discomfort during intercourse, not improved with Estradiol cream. Patient has ceased attempts at intercourse due to discomfort. -Consider use of vaginal estrogen inside the vagina. -Consider use of coconut oil suppositories. -Will consult with gynecology colleagues for additional recommendations.  Nail Changes White discoloration on toenails, not consistent with fungal infection.  -Start Biotin supplementation for nail health.  General Health Maintenance Up-to-date on vaccinations, including RSV, influenza, and COVID-19. -Order routine labs today. -Schedule follow-up appointment for next year.     Total time spent: 30 minutes  *Total Encounter Time as defined by the Centers for Medicare and Medicaid Services includes, in addition to the face-to-face time of a patient visit (documented in the note above) non-face-to-face time: obtaining and reviewing outside history, ordering and  reviewing medications, tests or procedures, care coordination (communications with other health care professionals or caregivers) and documentation in the medical record.

## 2023-05-30 ENCOUNTER — Ambulatory Visit (HOSPITAL_COMMUNITY): Payer: Medicare Other | Attending: Cardiology

## 2023-05-30 DIAGNOSIS — G459 Transient cerebral ischemic attack, unspecified: Secondary | ICD-10-CM

## 2023-05-30 LAB — ECHOCARDIOGRAM COMPLETE
Area-P 1/2: 4.35 cm2
P 1/2 time: 606 ms
S' Lateral: 2.5 cm

## 2023-06-12 NOTE — Progress Notes (Signed)
Cardiology Office Note:  .   Date:  06/12/2023  ID:  Joanne Hansen, DOB 09-25-1945, MRN [id number] PCP: Karie Georges, MD  Joanne Hansen Cardiologist:  Nanetta Batty, MD  History of Present Illness: .   Joanne Hansen is a 77 y.o. female  with a past medical history of paroxysmal atrial fibrillation, HLD. Patient is followed by Dr. Allyson Sabal and presents today for an annual follow up appointment    Part chart review patient establish care with Dr. Allyson Sabal in 07/2019 after she moved from [place] to [place] to be closer to her family.  Patient was referred to cardiology by her PCP for evaluation of dizziness and a history of remote PAF.  Patient does have a history of hyperlipidemia and has been on statin therapy.  Her father did have a MI at age 54 and required bypass surgery.  Patient denies history of tobacco use.  Denies history of MI or stroke.  She was diagnosed with breast cancer in 04/2019 and underwent lumpectomy with subsequent chemotherapy and radiation.  Patient had an echocardiogram on 08/15/2019 that showed EF 60-65%, no LVH, no regional wall motion abnormalities, normal RV systolic function, mild-moderate aortic valve regurgitation.  She wore a cardiac monitor in 08/2019 that showed sinus rhythm with short runs of PSVT.  Patient had her last radiation treatment for her breast cancer in 08/2019.   On 04/05/2023, patient developed pain near her right eye.  She also complained of an upper area of vision loss with pain.  She went to urgent care on 8/13 who instructed her to go to her eye doctor.  Reportedly, her eye doctor could not find any abnormality.  However, there was concern of possible TIA.  She underwent carotid ultrasounds on 8/16 that showed no significant stenosis of internal carotid arteries  I saw the patient in clinic on 05/09/23. AT that time, she reported that she had overall been doing well from a cardiac standpoint. She did have occasional  palpitations/fluttering in her chest. I ordered a cardiac monitor that showed predominantly normal sinus rhythm, 63 runs of SVT with longest lasting 12.3 seconds, rare PVCs and PACs. SVT was detected within 45 seconds of patient triggered events. Also ordered echocardiogram 05/30/23 that showed EF 60-65%, no regional wall motion abnormalities, normal RV function, mild aortic regurgitation. Overall, normal biventricular function without hemodynamically significant valvular disease.   Today patient reports that they have been having increased episodes of palpitations for the past few weeks. They describe these episodes as brief, lasting less than 30 seconds, and occurring approximately three to four times a day. The patient also reports occasional episodes of dizziness, characterized by difficulty focusing vision, but these episodes do not necessarily coincide with the heart fluttering.  The patient also reports a past history of chronic headaches, which have since resolved. They have a family history of stroke, with both their mother and sister having had strokes. The patient is concerned about their risk for stroke and whether they had a TIA.  The patient maintains a healthy diet, eating regularly throughout the day, and is mostly dairy-free and does not consume meat. They drink coffee and alcohol in moderation, with efforts to stay hydrated.The patient's medication regimen includes metoprolol, which they have been advised to increase to manage the increased episodes of heart fluttering.  ROS: Denies chest pain, shortness of breath, ankle edema, orthopnea. Occasionally has dizziness, palpitations.   Studies Reviewed: .    Cardiac Studies & Procedures  ECHOCARDIOGRAM  ECHOCARDIOGRAM COMPLETE 05/30/2023  Narrative ECHOCARDIOGRAM REPORT    Patient Name:   Joanne Hansen Date of Exam: 05/30/2023 Medical Rec #:  [id number]         Height:       62.0 in Accession #:    [id number]         Weight:       120.5 lb Date of Birth:  September 04, 1945         BSA:          1.541 m Patient Age:    77 years          BP:           111/54 mmHg Patient Gender: F                 HR:           59 bpm. Exam Location:  [address]  Procedure: 2D Echo, 3D Echo, Cardiac Doppler, Color Doppler and Strain Analysis  Indications:    G45.9 TIA  History:        Patient has prior history of Echocardiogram examinations, most recent 07/26/2019. Arrythmias:Paroxysmal Atrial Fibrillation, Signs/Symptoms:Syncope; Risk Factors:Dyslipidemia.  Sonographer:    Daphine Deutscher RDCS Referring Phys: [id number] Jonita Albee  IMPRESSIONS   1. Left ventricular ejection fraction, by estimation, is 60 to 65%. The left ventricle has normal function. The left ventricle has no regional wall motion abnormalities. Left ventricular diastolic parameters were normal. The average left ventricular global longitudinal strain is -26.5 %. The global longitudinal strain is normal. 2. Right ventricular systolic function is normal. The right ventricular size is normal. 3. The mitral valve is normal in structure. Trivial mitral valve regurgitation. No evidence of mitral stenosis. 4. The aortic valve is tricuspid. Aortic valve regurgitation is mild. No aortic stenosis is present. 5. The inferior vena cava is normal in size with greater than 50% respiratory variability, suggesting right atrial pressure of 3 mmHg.  Comparison(s): No significant change from prior study.  Conclusion(s)/Recommendation(s): Normal biventricular function without evidence of hemodynamically significant valvular heart disease.  FINDINGS Left Ventricle: Left ventricular ejection fraction, by estimation, is 60 to 65%. The left ventricle has normal function. The left ventricle has no regional wall motion abnormalities. The average left ventricular global longitudinal strain is -26.5 %. The global longitudinal strain is normal. The left ventricular  internal cavity size was normal in size. There is no left ventricular hypertrophy. Left ventricular diastolic parameters were normal.  Right Ventricle: The right ventricular size is normal. No increase in right ventricular wall thickness. Right ventricular systolic function is normal.  Left Atrium: Left atrial size was normal in size.  Right Atrium: Right atrial size was normal in size.  Pericardium: There is no evidence of pericardial effusion.  Mitral Valve: The mitral valve is normal in structure. Trivial mitral valve regurgitation. No evidence of mitral valve stenosis.  Tricuspid Valve: The tricuspid valve is normal in structure. Tricuspid valve regurgitation is trivial. No evidence of tricuspid stenosis.  Aortic Valve: The aortic valve is tricuspid. Aortic valve regurgitation is mild. Aortic regurgitation PHT measures 606 msec. No aortic stenosis is present.  Pulmonic Valve: The pulmonic valve was grossly normal. Pulmonic valve regurgitation is not visualized. No evidence of pulmonic stenosis.  Aorta: The aortic root, ascending aorta and aortic arch are all structurally normal, with no evidence of dilitation or obstruction.  Venous: The inferior vena cava is normal in size with greater than 50% respiratory variability, suggesting  ECHOCARDIOGRAM  ECHOCARDIOGRAM COMPLETE 05/30/2023  Narrative ECHOCARDIOGRAM REPORT    Patient Name:   Joanne Hansen Date of Exam: 05/30/2023 Medical Rec #:  [id number]         Height:       62.0 in Accession #:    [id number]         Weight:       120.5 lb Date of Birth:  September 04, 1945         BSA:          1.541 m Patient Age:    77 years          BP:           111/54 mmHg Patient Gender: F                 HR:           59 bpm. Exam Location:  [address]  Procedure: 2D Echo, 3D Echo, Cardiac Doppler, Color Doppler and Strain Analysis  Indications:    G45.9 TIA  History:        Patient has prior history of Echocardiogram examinations, most recent 07/26/2019. Arrythmias:Paroxysmal Atrial Fibrillation, Signs/Symptoms:Syncope; Risk Factors:Dyslipidemia.  Sonographer:    Daphine Deutscher RDCS Referring Phys: [id number] Jonita Albee  IMPRESSIONS   1. Left ventricular ejection fraction, by estimation, is 60 to 65%. The left ventricle has normal function. The left ventricle has no regional wall motion abnormalities. Left ventricular diastolic parameters were normal. The average left ventricular global longitudinal strain is -26.5 %. The global longitudinal strain is normal. 2. Right ventricular systolic function is normal. The right ventricular size is normal. 3. The mitral valve is normal in structure. Trivial mitral valve regurgitation. No evidence of mitral stenosis. 4. The aortic valve is tricuspid. Aortic valve regurgitation is mild. No aortic stenosis is present. 5. The inferior vena cava is normal in size with greater than 50% respiratory variability, suggesting right atrial pressure of 3 mmHg.  Comparison(s): No significant change from prior study.  Conclusion(s)/Recommendation(s): Normal biventricular function without evidence of hemodynamically significant valvular heart disease.  FINDINGS Left Ventricle: Left ventricular ejection fraction, by estimation, is 60 to 65%. The left ventricle has normal function. The left ventricle has no regional wall motion abnormalities. The average left ventricular global longitudinal strain is -26.5 %. The global longitudinal strain is normal. The left ventricular  internal cavity size was normal in size. There is no left ventricular hypertrophy. Left ventricular diastolic parameters were normal.  Right Ventricle: The right ventricular size is normal. No increase in right ventricular wall thickness. Right ventricular systolic function is normal.  Left Atrium: Left atrial size was normal in size.  Right Atrium: Right atrial size was normal in size.  Pericardium: There is no evidence of pericardial effusion.  Mitral Valve: The mitral valve is normal in structure. Trivial mitral valve regurgitation. No evidence of mitral valve stenosis.  Tricuspid Valve: The tricuspid valve is normal in structure. Tricuspid valve regurgitation is trivial. No evidence of tricuspid stenosis.  Aortic Valve: The aortic valve is tricuspid. Aortic valve regurgitation is mild. Aortic regurgitation PHT measures 606 msec. No aortic stenosis is present.  Pulmonic Valve: The pulmonic valve was grossly normal. Pulmonic valve regurgitation is not visualized. No evidence of pulmonic stenosis.  Aorta: The aortic root, ascending aorta and aortic arch are all structurally normal, with no evidence of dilitation or obstruction.  Venous: The inferior vena cava is normal in size with greater than 50% respiratory variability, suggesting  ECHOCARDIOGRAM  ECHOCARDIOGRAM COMPLETE 05/30/2023  Narrative ECHOCARDIOGRAM REPORT    Patient Name:   Joanne Hansen Date of Exam: 05/30/2023 Medical Rec #:  [id number]         Height:       62.0 in Accession #:    [id number]         Weight:       120.5 lb Date of Birth:  September 04, 1945         BSA:          1.541 m Patient Age:    77 years          BP:           111/54 mmHg Patient Gender: F                 HR:           59 bpm. Exam Location:  [address]  Procedure: 2D Echo, 3D Echo, Cardiac Doppler, Color Doppler and Strain Analysis  Indications:    G45.9 TIA  History:        Patient has prior history of Echocardiogram examinations, most recent 07/26/2019. Arrythmias:Paroxysmal Atrial Fibrillation, Signs/Symptoms:Syncope; Risk Factors:Dyslipidemia.  Sonographer:    Daphine Deutscher RDCS Referring Phys: [id number] Jonita Albee  IMPRESSIONS   1. Left ventricular ejection fraction, by estimation, is 60 to 65%. The left ventricle has normal function. The left ventricle has no regional wall motion abnormalities. Left ventricular diastolic parameters were normal. The average left ventricular global longitudinal strain is -26.5 %. The global longitudinal strain is normal. 2. Right ventricular systolic function is normal. The right ventricular size is normal. 3. The mitral valve is normal in structure. Trivial mitral valve regurgitation. No evidence of mitral stenosis. 4. The aortic valve is tricuspid. Aortic valve regurgitation is mild. No aortic stenosis is present. 5. The inferior vena cava is normal in size with greater than 50% respiratory variability, suggesting right atrial pressure of 3 mmHg.  Comparison(s): No significant change from prior study.  Conclusion(s)/Recommendation(s): Normal biventricular function without evidence of hemodynamically significant valvular heart disease.  FINDINGS Left Ventricle: Left ventricular ejection fraction, by estimation, is 60 to 65%. The left ventricle has normal function. The left ventricle has no regional wall motion abnormalities. The average left ventricular global longitudinal strain is -26.5 %. The global longitudinal strain is normal. The left ventricular  internal cavity size was normal in size. There is no left ventricular hypertrophy. Left ventricular diastolic parameters were normal.  Right Ventricle: The right ventricular size is normal. No increase in right ventricular wall thickness. Right ventricular systolic function is normal.  Left Atrium: Left atrial size was normal in size.  Right Atrium: Right atrial size was normal in size.  Pericardium: There is no evidence of pericardial effusion.  Mitral Valve: The mitral valve is normal in structure. Trivial mitral valve regurgitation. No evidence of mitral valve stenosis.  Tricuspid Valve: The tricuspid valve is normal in structure. Tricuspid valve regurgitation is trivial. No evidence of tricuspid stenosis.  Aortic Valve: The aortic valve is tricuspid. Aortic valve regurgitation is mild. Aortic regurgitation PHT measures 606 msec. No aortic stenosis is present.  Pulmonic Valve: The pulmonic valve was grossly normal. Pulmonic valve regurgitation is not visualized. No evidence of pulmonic stenosis.  Aorta: The aortic root, ascending aorta and aortic arch are all structurally normal, with no evidence of dilitation or obstruction.  Venous: The inferior vena cava is normal in size with greater than 50% respiratory variability, suggesting

## 2023-06-21 ENCOUNTER — Ambulatory Visit: Payer: Medicare Other | Attending: Cardiology | Admitting: Cardiology

## 2023-06-21 ENCOUNTER — Encounter: Payer: Self-pay | Admitting: Cardiology

## 2023-06-21 VITALS — BP 128/60 | HR 65 | Ht 62.0 in | Wt 120.0 lb

## 2023-06-21 DIAGNOSIS — E782 Mixed hyperlipidemia: Secondary | ICD-10-CM | POA: Diagnosis not present

## 2023-06-21 DIAGNOSIS — R002 Palpitations: Secondary | ICD-10-CM | POA: Insufficient documentation

## 2023-06-21 DIAGNOSIS — G459 Transient cerebral ischemic attack, unspecified: Secondary | ICD-10-CM | POA: Diagnosis present

## 2023-06-21 DIAGNOSIS — I471 Supraventricular tachycardia, unspecified: Secondary | ICD-10-CM | POA: Diagnosis not present

## 2023-06-21 MED ORDER — METOPROLOL TARTRATE 25 MG PO TABS: ORAL_TABLET | ORAL | 3 refills | Status: DC

## 2023-06-21 NOTE — Patient Instructions (Addendum)
Medication Instructions:  Increase Metoprolol Tartrate 25 mg twice a day *If you need a refill on your cardiac medications before your next appointment, please call your pharmacy*   Lab Work: No labs If you have labs (blood work) drawn today and your tests are completely normal, you will receive your results only by: MyChart Message (if you have MyChart) OR A paper copy in the mail If you have any lab test that is abnormal or we need to change your treatment, we will call you to review the results.   Testing/Procedures: No testing   Follow-Up: At [facility], you and your health needs are our priority.  As part of our continuing mission to provide you with exceptional heart care, we have created designated Provider Care Teams.  These Care Teams include your primary Cardiologist (physician) and Advanced Practice Providers (APPs -  Physician Assistants and Nurse Practitioners) who all work together to provide you with the care you need, when you need it.  We recommend signing up for the patient portal called "MyChart".  Sign up information is provided on this After Visit Summary.  MyChart is used to connect with patients for Virtual Visits (Telemedicine).  Patients are able to view lab/test results, encounter notes, upcoming appointments, etc.  Non-urgent messages can be sent to your provider as well.   To learn more about what you can do with MyChart, go to [email or web address].    Your next appointment:   6-8 week(s)  Provider:   Nanetta Batty, MD  Other Directions: We will refer you out to Neurology

## 2023-06-27 ENCOUNTER — Telehealth: Payer: Self-pay | Admitting: Neurology

## 2023-06-27 ENCOUNTER — Ambulatory Visit (INDEPENDENT_AMBULATORY_CARE_PROVIDER_SITE_OTHER): Payer: Medicare Other | Admitting: Neurology

## 2023-06-27 ENCOUNTER — Encounter: Payer: Self-pay | Admitting: Neurology

## 2023-06-27 VITALS — BP 136/70 | Ht 62.0 in | Wt 120.0 lb

## 2023-06-27 DIAGNOSIS — G43109 Migraine with aura, not intractable, without status migrainosus: Secondary | ICD-10-CM | POA: Diagnosis not present

## 2023-06-27 DIAGNOSIS — G459 Transient cerebral ischemic attack, unspecified: Secondary | ICD-10-CM

## 2023-06-27 NOTE — Telephone Encounter (Signed)
medicare/AAPR NPR sent to GI [phone]

## 2023-06-27 NOTE — Patient Instructions (Addendum)
MRI Brain without contrast  I will contact you to go over the result Discontinue delta 9 Consider CBD or melatonin for sleep aid Continue to follow with PCP and return as needed.

## 2023-06-27 NOTE — Progress Notes (Signed)
[facility]  PATIENT: Joanne Hansen DOB: [birth year]  REQUESTING CLINICIAN: Jonita Albee, PA* HISTORY FROM: Patient  REASON FOR VISIT: Headaches and right eye vision loss    HISTORICAL  CHIEF COMPLAINT:  Chief Complaint  Patient presents with   New Patient (Initial Visit)    Rm 13, NP, suspected TIA    HISTORY OF PRESENT ILLNESS:  This is a 77 year old woman past medical history of SVT, hyperlipidemia, history of migraine as a young adult who is presenting with complaint of sharp pain above the right eye.  Patient reports in August she had 1 episode of sharp pain above the right eye lasting a few seconds then went away, a few weeks later, she had a second episode of sharp pain above the right eye it lasted about 30 minutes. This one was associated with vision loss lasted about 30 seconds in the right eye.  During this time also, she was experiencing similar dizzy spells and difficulty focusing.  She presented to the ED, at that time her symptom subsided.  She was referred to ophthalmology.  She followed-up with her PCP and had a carotid ultrasound which showed no large vessel stenosis, and also have echocardiogram which was normal. On top of that patient also has been complaining of distortion in her vision, usually lasting about 20 to 30-minutes without pain, she described them as zigzagging of light, the last episode was yesterday but prior to that was maybe a year ago. For her prior history of migraine, she reports propranolol was very helpful in controlling her headaches.   OTHER MEDICAL CONDITIONS: SVT, Hyperlipidemia, History of migraines    REVIEW OF SYSTEMS: Full 14 system review of systems performed and negative with exception of: As noted in the HPI   ALLERGIES: Allergies  Allergen Reactions   Penicillins Swelling   Sulfa Antibiotics Nausea And Vomiting    HOME MEDICATIONS: Outpatient Medications Prior to Visit  Medication Sig Dispense  Refill   ascorbic acid (VITAMIN C) 500 MG tablet Take 500 mg by mouth daily.     CALCIUM-VITAMIN D PO Take 2 capsules by mouth daily. Vit D 1000 with minerals     carboxymethylcellulose (REFRESH PLUS) 0.5 % SOLN 1 drop as needed.     Cyanocobalamin (VITAMIN B 12 PO) Take 1,000 mg by mouth. Take one tablet three times weekly     metoprolol tartrate (LOPRESSOR) 25 MG tablet TAKE 1 TABLET BY MOUTH TWICE DAILY 90 tablet 3   simvastatin (ZOCOR) 40 MG tablet Take 1 tablet by mouth once daily 90 tablet 0   No facility-administered medications prior to visit.    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   Family history of brain cancer    Family history of breast cancer    Family history of colon cancer    Fecal incontinence    05-16-2019 per pt only a little leakage but controllable (was in clinical trial for [facility] without benefit.)   Frequent headaches    History of syncope    pre-syncope  08/ 2015  per pt no issue with this since    Hyperlipidemia    Malignant neoplasm of upper-inner quadrant of right breast in female, estrogen receptor negative [facility]) oncologist-- dr Darnelle Catalan  dr moody   dx 08/ 2020--  invasive ductal carcinoma,  05-13-2019  s/p right breast lumpectomy    PAF (  paroxysmal atrial fibrillation) (HCC) (05-16-2019   pt from [place], recently moved to [place] to be near daughter, has not established a cardiology yet--  previously seen by dr Kern Alberta cossu (last office note dated 02-28-2018 scanned in epic)---  echo 04-15-2014  ef 55-60%, G1DD mild AR without stenosis, RVSp   Personal history of radiation therapy    stopped 09/22/19    Rectal cyst    Thyroid goiter    pt ultrasound in epic 06-04-2014 , nodules, no bx done   Urinary incontinence, mixed     PAST SURGICAL HISTORY: Past Surgical History:  Procedure Laterality Date   ABDOMINAL HYSTERECTOMY  1978   w/  RSO  and  APPENDECTOMY   BREAST BIOPSY Right 08/2009   benign   BREAST LUMPECTOMY WITH RADIOACTIVE SEED AND SENTINEL LYMPH NODE BIOPSY Right 05/15/2019   Procedure: RIGHT BREAST LUMPECTOMY WITH RADIOACTIVE SEED AND SENTINEL LYMPH NODE MAPPING;  Surgeon: Harriette Bouillon, MD;  Location: [facility];  Service: General;  Laterality: Right;   BREAST SURGERY  2011   biospy of right breast   CATARACT EXTRACTION W/ INTRAOCULAR LENS  IMPLANT, BILATERAL  2004   COLONOSCOPY  11/21/2019   FLEXIBLE SIGMOIDOSCOPY N/A 05/18/2019   Procedure: FLEXIBLE SIGMOIDOSCOPY,  TRANSANAL EXCISION inclusion cyst;  Surgeon: Romie Levee, MD;  Location: [facility];  Service: General;  Laterality: N/A;   gluteus medius repair  2017   LUMBAR LAMINECTOMY  2013   L5 -- S1   PORT-A-CATH REMOVAL Right 09/27/2019   Procedure: PORT REMOVAL;  Surgeon: Harriette Bouillon, MD;  Location: [facility];  Service: General;  Laterality: Right;   PORTACATH PLACEMENT Right 05/15/2019   Procedure: INSERTION PORT-A-CATH WITH ULTRASOUND;  Surgeon: Harriette Bouillon, MD;  Location: [facility];  Service: General;  Laterality: Right;   ROTATOR CUFF REPAIR Left 2001    FAMILY HISTORY: Family History  Problem Relation Age of Onset   COPD Mother    Cancer Mother 72       colon   Arthritis Mother    Hypertension Mother    Miscarriages / [place] Mother    Stroke Mother 34   Alzheimer's disease Mother 44   Heart disease Father    Heart attack Father 22   Lung disease Father    Arthritis Sister    Breast cancer Sister 83       Lumpectomy   Diabetes Maternal Grandmother    Breast cancer Niece 61       Louise's Daughter   Cervical cancer Niece 81    SOCIAL HISTORY: Social History   Socioeconomic History   Marital status: Married    Spouse name: Not on file   Number of children: Not on file   Years of education: Not on file   Highest education level: Not on file  Occupational  History   Not on file  Tobacco Use   Smoking status: Never   Smokeless tobacco: Never  Vaping Use   Vaping status: Never Used  Substance and Sexual Activity   Alcohol use: Yes    Comment: Cocktails-3 days per week   Drug use: Never    Types: Amphetamines   Sexual activity: Not Currently    Birth control/protection: Surgical  Other Topics Concern   Not on file  Social History Narrative   Right handed   Married   Caffeine-2 cups daily   Social Determinants of Health   Financial Resource Strain: Low Risk  (08/05/2022)  Overall Financial Resource Strain (CARDIA)    Difficulty of Paying Living Expenses: Not hard at all  Food Insecurity: No Food Insecurity (08/05/2022)   Hunger Vital Sign    Worried About Running Out of Food in the Last Year: Never true    Ran Out of Food in the Last Year: Never true  Transportation Needs: No Transportation Needs (08/05/2022)   PRAPARE - Administrator, Civil Service (Medical): No    Lack of Transportation (Non-Medical): No  Physical Activity: Sufficiently Active (08/05/2022)   Exercise Vital Sign    Days of Exercise per Week: 5 days    Minutes of Exercise per Session: 40 min  Stress: No Stress Concern Present (08/05/2022)   [employer] of Occupational Health - Occupational Stress Questionnaire    Feeling of Stress : Not at all  Social Connections: Moderately Isolated (08/05/2022)   Social Connection and Isolation Panel [NHANES]    Frequency of Communication with Friends and Family: More than three times a week    Frequency of Social Gatherings with Friends and Family: More than three times a week    Attends Religious Services: Never    Database administrator or Organizations: No    Attends Banker Meetings: Never    Marital Status: Married  Catering manager Violence: Not At Risk (08/05/2022)   Humiliation, Afraid, Rape, and Kick questionnaire    Fear of Current or Ex-Partner: No    Emotionally Abused: No     Physically Abused: No    Sexually Abused: No    PHYSICAL EXAM  GENERAL EXAM/CONSTITUTIONAL: Vitals:  Vitals:   06/27/23 1259  BP: 136/70  Weight: 120 lb (54.4 kg)  Height: 5\' 2"  (1.575 m)   Body mass index is 21.95 kg/m. Wt Readings from Last 3 Encounters:  06/27/23 120 lb (54.4 kg)  06/21/23 120 lb (54.4 kg)  05/12/23 120 lb 8 oz (54.7 kg)   Patient is in no distress; well developed, nourished and groomed; neck is supple  MUSCULOSKELETAL: Gait, strength, tone, movements noted in Neurologic exam below  NEUROLOGIC: MENTAL STATUS:      No data to display         awake, alert, oriented to person, place and time recent and remote memory intact normal attention and concentration language fluent, comprehension intact, naming intact fund of knowledge appropriate  CRANIAL NERVE:  2nd, 3rd, 4th, 6th - Visual fields full to confrontation, extraocular muscles intact, no nystagmus 5th - facial sensation symmetric 7th - facial strength symmetric 8th - hearing intact 9th - palate elevates symmetrically, uvula midline 11th - shoulder shrug symmetric 12th - tongue protrusion midline  MOTOR:  normal bulk and tone, full strength in the BUE, BLE  SENSORY:  normal and symmetric to light touch  COORDINATION:  finger-nose-finger, fine finger movements normal  GAIT/STATION:  normal    DIAGNOSTIC DATA (LABS, IMAGING, TESTING) - I reviewed patient records, labs, notes, testing and imaging myself where available.  Lab Results  Component Value Date   WBC 5.7 05/12/2023   HGB 13.0 05/12/2023   HCT 39.7 05/12/2023   MCV 101.0 (H) 05/12/2023   PLT 205 05/12/2023      Component Value Date/Time   NA 139 05/12/2023 1400   K 4.0 05/12/2023 1400   CL 103 05/12/2023 1400   CO2 31 05/12/2023 1400   GLUCOSE 91 05/12/2023 1400   BUN 16 05/12/2023 1400   CREATININE 0.68 05/12/2023 1400   CALCIUM 9.8 05/12/2023 1400  PROT 7.0 05/12/2023 1400   PROT 6.6 02/06/2021 0845    ALBUMIN 4.3 05/12/2023 1400   ALBUMIN 4.7 02/06/2021 0845   AST 18 05/12/2023 1400   ALT 13 05/12/2023 1400   ALKPHOS 54 05/12/2023 1400   BILITOT 0.7 05/12/2023 1400   GFRNONAA >60 05/12/2023 1400   GFRAA >60 05/21/2020 1413   GFRAA >60 05/07/2019 1547   Lab Results  Component Value Date   CHOL 127 08/17/2022   HDL 58.30 08/17/2022   LDLCALC 59 08/17/2022   TRIG 51.0 08/17/2022   CHOLHDL 2 08/17/2022   Lab Results  Component Value Date   HGBA1C 5.6 07/02/2019   Lab Results  Component Value Date   VITAMINB12 790 12/20/2019   Lab Results  Component Value Date   TSH 1.06 07/02/2019    Echo 05/30/2023 1. Left ventricular ejection fraction, by estimation, is 60 to 65%. The left ventricle has normal function. The left ventricle has no regional wall motion abnormalities. Left ventricular diastolic parameters were normal. The average left ventricular global longitudinal strain is -26.5 %. The global longitudinal strain is normal.  2. Right ventricular systolic function is normal. The right ventricular size is normal.  3. The mitral valve is normal in structure. Trivial mitral valve regurgitation. No evidence of mitral stenosis.  4. The aortic valve is tricuspid. Aortic valve regurgitation is mild. No aortic stenosis is present.  5. The inferior vena cava is normal in size with greater than 50% respiratory variability, suggesting right atrial pressure of 3 mmHg    Carotid US 04/08/2023 No significant stenosis of internal carotid arteries.     ASSESSMENT AND PLAN  77 y.o. year old female with history of SVT, hyperlipidemia, previous history of migraine who is presenting with complaint of right supraorbital pain with vision loss lasting less than a minute.  Differential diagnoses include TIA versus complicated migraine or migraine with auras.  She does have what sounds like migraine aura without headache.  Plan for now is to obtain a MRI brain to complete the TIA workup but I  have told patient that her symptom are more consistent to migraine.  She is already on Zocor.  Her most recent LDL was 59.   Of note patient also tells me that she use delta 9 Gummies for sleep, this is basically THC and I advised against it.  Told her to stop using it for sleep, she can consider melatonin or CBD or another sleep aid.  I will contact her to go over the results of the MRI otherwise continue to follow with PCP return if symptoms are worse.   1. TIA (transient ischemic attack)   2. Migraine with aura and without status migrainosus, not intractable      Patient Instructions  MRI Brain without contrast  I will contact you to go over the result Discontinue delta 9 Consider CBD or melatonin for sleep aid Continue to follow with PCP and return as needed.  Orders Placed This Encounter  Procedures   MR BRAIN WO CONTRAST    No orders of the defined types were placed in this encounter.   Return if symptoms worsen or fail to improve.    Windell Norfolk, MD 06/27/2023, 5:20 PM  [facility] [address], [address] [place], [place] [address] [phone]

## 2023-07-24 ENCOUNTER — Ambulatory Visit
Admission: RE | Admit: 2023-07-24 | Discharge: 2023-07-24 | Disposition: A | Discharge status: Home / Self Care | Payer: Medicare Other | Source: Ambulatory Visit | Attending: Neurology

## 2023-07-24 DIAGNOSIS — G459 Transient cerebral ischemic attack, unspecified: Secondary | ICD-10-CM

## 2023-07-26 ENCOUNTER — Other Ambulatory Visit: Payer: Self-pay | Admitting: Family Medicine

## 2023-07-26 DIAGNOSIS — E782 Mixed hyperlipidemia: Secondary | ICD-10-CM

## 2023-08-02 ENCOUNTER — Encounter: Payer: Self-pay | Admitting: Cardiovascular Disease

## 2023-08-02 ENCOUNTER — Ambulatory Visit: Payer: Medicare Other | Attending: Cardiovascular Disease | Admitting: Cardiovascular Disease

## 2023-08-02 VITALS — BP 115/60 | HR 71 | Ht 62.0 in | Wt 120.0 lb

## 2023-08-02 DIAGNOSIS — E782 Mixed hyperlipidemia: Secondary | ICD-10-CM | POA: Insufficient documentation

## 2023-08-02 DIAGNOSIS — R002 Palpitations: Secondary | ICD-10-CM | POA: Insufficient documentation

## 2023-08-02 NOTE — Assessment & Plan Note (Signed)
History of hyperlipidemia on simvastatin with lipid profile performed 08/17/2022 revealing total cholesterol of 127, LDL of 59 and HDL 58.

## 2023-08-02 NOTE — Patient Instructions (Signed)
    Follow-Up: At [facility], you and your health needs are our priority.  As part of our continuing mission to provide you with exceptional heart care, we have created designated Provider Care Teams.  These Care Teams include your primary Cardiologist (physician) and Advanced Practice Providers (APPs -  Physician Assistants and Nurse Practitioners) who all work together to provide you with the care you need, when you need it.    Your next appointment:   3 month(s)  Provider:   Nanetta Batty, MD

## 2023-08-02 NOTE — Progress Notes (Signed)
08/02/2023 Joanne Hansen   08-05-1946  [id number]  Primary Physician Karie Georges, MD Primary Cardiologist: Runell Gess MD Nicholes Calamity, [place]  HPI:  Joanne Hansen is a 77 y.o.   thin and fit appearing married Caucasian female mother of 1 child who recently relocated from the [place] to [place] to be closer to family.  She is retired from doing clerical work.  She was referred by Dr. Hassan Rowan , her PCP, for episodes of dizziness and remote PAF.  I last saw her in the office 05/05/2022.  She did see Edd Fabian, NP in the office 09/13/2019. Her only risk factor is mild hyperlipidemia on statin therapy.  Her father did have a myocardial infarction at age 69 had bypass surgery.  She is never smoked.  She is never had a heart attack or stroke.  She denies chest pain or shortness of breath but she does have fatigue.  She developed breast cancer in September and had lumpectomy and has just finished her last round of chemotherapy and scheduled to start radiation therapy.  She feels fatigued.  She said 6 episodes of dizziness since starting chemotherapy without frank syncope.  She did have PAF demonstrated by Holter monitor in [place] in 2014.  She was followed by a cardiologist there who did not anticoagulate her.  Since that time she has had infrequent episodes of brief tachypalpitations.   I did order an event monitor but resulted on 08/05/2019 that did not show any PAF.  She had short runs of PSVT.  A 2D echo performed 08/16/2019 was essentially normal.  Unfortunately she did contract herpes zoster on the left side of her face which she is significantly symptomatic from but is slowly improving on Valtrex.  She did have her last radiation treatment for her breast cancer 09/21/2019.   Since I saw her a year ago she continues to do well.  She did have pain above her right eye and was evaluated for TIA.  Workup was unrevealing.  She still gets frequent  palpitations.  She saw Robet Leu, PA-C recently who increased her metoprolol from 12.5 to 25 mg p.o. twice daily.  This did not significantly affect the frequency and/or severity of her palpitations.  She does admit to drinking 2 cups of coffee a day.  Otherwise she is fairly active, walks several miles a day and denies chest pain or shortness of breath.   Current Meds  Medication Sig   ascorbic acid (VITAMIN C) 500 MG tablet Take 500 mg by mouth daily.   CALCIUM-VITAMIN D PO Take 2 capsules by mouth daily. Vit D 1000 with minerals   carboxymethylcellulose (REFRESH PLUS) 0.5 % SOLN 1 drop as needed.   Cyanocobalamin (VITAMIN B 12 PO) Take 1,000 mg by mouth. Take one tablet three times weekly   metoprolol tartrate (LOPRESSOR) 25 MG tablet TAKE 1 TABLET BY MOUTH TWICE DAILY   simvastatin (ZOCOR) 40 MG tablet Take 1 tablet by mouth once daily     Allergies  Allergen Reactions   Penicillins Swelling   Sulfa Antibiotics Nausea And Vomiting    Social History   Socioeconomic History   Marital status: Married    Spouse name: Not on file   Number of children: Not on file   Years of education: Not on file   Highest education level: Not on file  Occupational History   Not on file  Tobacco Use   Smoking status:  Never   Smokeless tobacco: Never  Vaping Use   Vaping status: Never Used  Substance and Sexual Activity   Alcohol use: Yes    Comment: Cocktails-3 days per week   Drug use: Never    Types: Amphetamines   Sexual activity: Not Currently    Birth control/protection: Surgical  Other Topics Concern   Not on file  Social History Narrative   Right handed   Married   Caffeine-2 cups daily   Social Determinants of Health   Financial Resource Strain: Low Risk  (08/05/2022)   Overall Financial Resource Strain (CARDIA)    Difficulty of Paying Living Expenses: Not hard at all  Food Insecurity: No Food Insecurity (08/05/2022)   Hunger Vital Sign    Worried About Running Out  of Food in the Last Year: Never true    Ran Out of Food in the Last Year: Never true  Transportation Needs: No Transportation Needs (08/05/2022)   PRAPARE - Administrator, Civil Service (Medical): No    Lack of Transportation (Non-Medical): No  Physical Activity: Sufficiently Active (08/05/2022)   Exercise Vital Sign    Days of Exercise per Week: 5 days    Minutes of Exercise per Session: 40 min  Stress: No Stress Concern Present (08/05/2022)   [employer] of Occupational Health - Occupational Stress Questionnaire    Feeling of Stress : Not at all  Social Connections: Moderately Isolated (08/05/2022)   Social Connection and Isolation Panel [NHANES]    Frequency of Communication with Friends and Family: More than three times a week    Frequency of Social Gatherings with Friends and Family: More than three times a week    Attends Religious Services: Never    Database administrator or Organizations: No    Attends Banker Meetings: Never    Marital Status: Married  Catering manager Violence: Not At Risk (08/05/2022)   Humiliation, Afraid, Rape, and Kick questionnaire    Fear of Current or Ex-Partner: No    Emotionally Abused: No    Physically Abused: No    Sexually Abused: No     Review of Systems: General: negative for chills, fever, night sweats or weight changes.  Cardiovascular: negative for chest pain, dyspnea on exertion, edema, orthopnea, palpitations, paroxysmal nocturnal dyspnea or shortness of breath Dermatological: negative for rash Respiratory: negative for cough or wheezing Urologic: negative for hematuria Abdominal: negative for nausea, vomiting, diarrhea, bright red blood per rectum, melena, or hematemesis Neurologic: negative for visual changes, syncope, or dizziness All other systems reviewed and are otherwise negative except as noted above.    Blood pressure 115/60, pulse 71, height 5\' 2"  (1.575 m), weight 120 lb (54.4 kg), SpO2  97%.  General appearance: alert and no distress Neck: no adenopathy, no carotid bruit, no JVD, supple, symmetrical, trachea midline, and thyroid not enlarged, symmetric, no tenderness/mass/nodules Lungs: clear to auscultation bilaterally Heart: regular rate and rhythm, S1, S2 normal, no murmur, click, rub or gallop Extremities: extremities normal, atraumatic, no cyanosis or edema Pulses: 2+ and symmetric Skin: Skin color, texture, turgor normal. No rashes or lesions Neurologic: Grossly normal  EKG not performed today      ASSESSMENT AND PLAN:   Hyperlipidemia History of hyperlipidemia on simvastatin with lipid profile performed 08/17/2022 revealing total cholesterol of 127, LDL of 59 and HDL 58.  Palpitations History of palpitations with event monitor performed 06/02/2023 revealing no evidence of PAF but frequent runs of SVT.  She saw Nicholos Johns  Laural Benes, PA-C who increased her dose of metoprolol which did not significantly affect the frequency or severity of her palpitations.  We talked about caffeine.  She drinks 2 cups of coffee a day.  I told her to eliminate caffeine from her diet and we will reassess her in 3 months.  If she still having palpitations at that time I will refer her to EP.     Runell Gess MD FACP,FACC,FAHA, [facility] 08/02/2023 3:47 PM

## 2023-08-02 NOTE — Assessment & Plan Note (Signed)
History of palpitations with event monitor performed 06/02/2023 revealing no evidence of PAF but frequent runs of SVT.  She saw Robet Leu, PA-C who increased her dose of metoprolol which did not significantly affect the frequency or severity of her palpitations.  We talked about caffeine.  She drinks 2 cups of coffee a day.  I told her to eliminate caffeine from her diet and we will reassess her in 3 months.  If she still having palpitations at that time I will refer her to EP.

## 2023-08-08 ENCOUNTER — Encounter: Payer: Medicare Other | Admitting: Family Medicine

## 2023-08-08 ENCOUNTER — Encounter: Payer: Self-pay | Admitting: Family Medicine

## 2023-08-08 ENCOUNTER — Ambulatory Visit (INDEPENDENT_AMBULATORY_CARE_PROVIDER_SITE_OTHER): Payer: Medicare Other | Admitting: Family Medicine

## 2023-08-08 VITALS — BP 110/70 | HR 78 | Temp 98.4°F | Ht 62.25 in | Wt 119.5 lb

## 2023-08-08 DIAGNOSIS — E782 Mixed hyperlipidemia: Secondary | ICD-10-CM | POA: Diagnosis not present

## 2023-08-08 DIAGNOSIS — Z131 Encounter for screening for diabetes mellitus: Secondary | ICD-10-CM | POA: Diagnosis not present

## 2023-08-08 DIAGNOSIS — Z0000 Encounter for general adult medical examination without abnormal findings: Secondary | ICD-10-CM

## 2023-08-08 LAB — LIPID PANEL
Cholesterol: 180 mg/dL (ref 0–200)
HDL: 88.8 mg/dL (ref >39.00)
LDL Cholesterol: 76 mg/dL (ref 0–99)
NonHDL: 91.32
Total CHOL/HDL Ratio: 2
Triglycerides: 79 mg/dL (ref 0.0–149.0)
VLDL: 15.8 mg/dL (ref 0.0–40.0)

## 2023-08-08 NOTE — Progress Notes (Signed)
Subjective:   Joanne Hansen is a 77 y.o. female who presents for Medicare Annual (Subsequent) preventive examination.  Visit Complete: In person  Patient Medicare AWV questionnaire was completed by the patient on 08/08/2023; I have confirmed that all information answered by patient is correct and no changes since this date.        Objective:    Today's Vitals   08/08/23 0936  BP: 110/70  Pulse: 78  Temp: 98.4 F (36.9 C)  TempSrc: Oral  SpO2: 100%  Weight: 119 lb 8 oz (54.2 kg)  Height: 5' 2.25" (1.581 m)   Body mass index is 21.68 kg/m.     08/08/2023    9:59 AM 08/05/2022   11:26 AM 04/07/2022   12:35 PM 08/03/2021   11:39 AM 07/28/2020    3:04 PM 10/08/2019   11:57 AM 09/27/2019    7:38 AM  Advanced Directives  Does Patient Have a Medical Advance Directive? No No No No No No No  Would patient like information on creating a medical advance directive? Yes (MAU/Ambulatory/Procedural Areas - Information given) No - Patient declined No - Patient declined No - Patient declined No - Patient declined No - Patient declined No - Patient declined    Current Medications (verified) Outpatient Encounter Medications as of 08/08/2023  Medication Sig   ascorbic acid (VITAMIN C) 500 MG tablet Take 500 mg by mouth daily.   CALCIUM-VITAMIN D PO Take 2 capsules by mouth daily. Vit D 1000 with minerals   carboxymethylcellulose (REFRESH PLUS) 0.5 % SOLN 1 drop as needed.   Cyanocobalamin (VITAMIN B 12 PO) Take 1,000 mg by mouth. Take one tablet three times weekly   metoprolol tartrate (LOPRESSOR) 25 MG tablet TAKE 1 TABLET BY MOUTH TWICE DAILY   simvastatin (ZOCOR) 40 MG tablet Take 1 tablet by mouth once daily   No facility-administered encounter medications on file as of 08/08/2023.    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    Family history of brain cancer    Family history of breast cancer    Family history of colon cancer    Fecal incontinence    05-16-2019 per pt only a little leakage but controllable (was in clinical trial for [facility] without benefit.)   Frequent headaches    History of syncope    pre-syncope  08/ 2015  per pt no issue with this since    Hyperlipidemia    Malignant neoplasm of upper-inner quadrant of right breast in female, estrogen receptor negative [facility]) oncologist-- dr Darnelle Catalan  dr moody   dx 08/ 2020--  invasive ductal carcinoma,  05-13-2019  s/p right breast lumpectomy    PAF (paroxysmal atrial fibrillation) (HCC) (05-16-2019   pt from [place], recently moved to [place] to be near daughter, has not established a cardiology yet--  previously seen by dr Kern Alberta cossu (last office note dated 02-28-2018 scanned in epic)---  echo 04-15-2014  ef 55-60%, G1DD mild AR without stenosis, RVSp   Personal history of radiation therapy    stopped 09/22/19    Rectal cyst    Thyroid goiter    pt ultrasound in epic 06-04-2014 , nodules, no bx done   Urinary incontinence, mixed    Past Surgical History:  Procedure Laterality Date   ABDOMINAL HYSTERECTOMY  1978   w/  RSO  and  APPENDECTOMY   BREAST BIOPSY Right 08/2009   benign   BREAST LUMPECTOMY WITH RADIOACTIVE SEED AND SENTINEL LYMPH NODE BIOPSY Right 05/15/2019   Procedure: RIGHT BREAST LUMPECTOMY WITH RADIOACTIVE SEED AND SENTINEL LYMPH NODE MAPPING;  Surgeon: Harriette Bouillon, MD;  Location: [facility];  Service: General;  Laterality: Right;   BREAST SURGERY  2011   biospy of right breast   CATARACT EXTRACTION W/ INTRAOCULAR LENS  IMPLANT, BILATERAL  2004   COLONOSCOPY  11/21/2019   FLEXIBLE SIGMOIDOSCOPY N/A 05/18/2019   Procedure: FLEXIBLE SIGMOIDOSCOPY,  TRANSANAL EXCISION inclusion cyst;  Surgeon: Romie Levee, MD;  Location: [facility];  Service: General;  Laterality: N/A;   gluteus medius repair  2017    LUMBAR LAMINECTOMY  2013   L5 -- S1   PORT-A-CATH REMOVAL Right 09/27/2019   Procedure: PORT REMOVAL;  Surgeon: Harriette Bouillon, MD;  Location: [facility];  Service: General;  Laterality: Right;   PORTACATH PLACEMENT Right 05/15/2019   Procedure: INSERTION PORT-A-CATH WITH ULTRASOUND;  Surgeon: Harriette Bouillon, MD;  Location: [facility];  Service: General;  Laterality: Right;   ROTATOR CUFF REPAIR Left 2001   Family History  Problem Relation Age of Onset   COPD Mother    Cancer Mother 106       colon   Arthritis Mother    Hypertension Mother    Miscarriages / [place] Mother    Stroke Mother 72   Alzheimer's disease Mother 56   Heart disease Father    Heart attack Father 32   Lung disease Father    Arthritis Sister    Breast cancer Sister 30       Lumpectomy   Diabetes Maternal Grandmother    Breast cancer Niece 43       Louise's Daughter   Cervical cancer Niece 27   Social History   Socioeconomic History   Marital status: Married    Spouse name: Not on file   Number of children: Not on file   Years of education: Not on file   Highest education level: Not on file  Occupational History   Not on file  Tobacco Use   Smoking status: Never   Smokeless tobacco: Never  Vaping Use   Vaping status: Never Used  Substance and Sexual Activity   Alcohol use: Yes    Comment: Cocktails-3 days per week   Drug use: Never    Types: Amphetamines   Sexual activity: Not Currently    Birth control/protection: Surgical  Other Topics Concern   Not on file  Social History Narrative   Right handed   Married   Caffeine-2 cups daily   Social Drivers of Health   Financial Resource Strain: Low Risk  (08/08/2023)   Overall Financial Resource Strain (CARDIA)    Difficulty of Paying Living Expenses: Not hard at all  Food Insecurity: No Food Insecurity (08/08/2023)   Hunger Vital Sign    Worried About Running Out of Food in the Last Year: Never true    Ran  Out of Food in the Last Year: Never true  Transportation Needs: No Transportation Needs (08/08/2023)   PRAPARE - Administrator, Civil Service (Medical): No    Lack of Transportation (Non-Medical): No  Physical Activity: Sufficiently Active (08/08/2023)   Exercise Vital Sign    Days of Exercise per Week: 5 days    Minutes of Exercise per Session: 40 min  Stress: No Stress Concern Present (08/08/2023)  [employer] of Occupational Health - Occupational Stress Questionnaire    Feeling of Stress : Only a little  Social Connections: Moderately Isolated (08/08/2023)   Social Connection and Isolation Panel [NHANES]    Frequency of Communication with Friends and Family: More than three times a week    Frequency of Social Gatherings with Friends and Family: Once a week    Attends Religious Services: Never    Database administrator or Organizations: No    Attends Engineer, structural: Not on file    Marital Status: Married    Tobacco Counseling Counseling given: Not Answered Pt is not a smoker so cessation counseling is not needed   Activities of Daily Living    08/08/2023    9:56 AM  In your present state of health, do you have any difficulty performing the following activities:  Hearing? 1  Comment questioning possibly hearing loss  Vision? 0  Difficulty concentrating or making decisions? 0  Walking or climbing stairs? 0  Dressing or bathing? 0  Doing errands, shopping? 0    Patient Care Team: Karie Georges, MD as PCP - General (Family Medicine) Runell Gess, MD as PCP - Cardiology (Cardiology) Magrinat, Valentino Hue, MD (Inactive) as Consulting Physician (Oncology) Harriette Bouillon, MD as Consulting Physician (General Surgery) Dorothy Puffer, MD as Consulting Physician (Radiation Oncology) Romie Levee, MD as Consulting Physician (General Surgery) Alfredo Martinez, MD as Consulting Physician (Urology) Sheran Luz, MD as Consulting Physician  (Physical Medicine and Rehabilitation) Zelphia Cairo, MD as Consulting Physician (Obstetrics and Gynecology)  Indicate any recent Medical Services you may have received from other than [facility] providers in the past year (date may be approximate).  Physical Exam Vitals reviewed.  Constitutional:      Appearance: Normal appearance. She is normal weight.  HENT:     Right Ear: Tympanic membrane normal.     Left Ear: Tympanic membrane normal.     Ears:     Comments: Cerumen noted in bot EAC's pt made aware    Mouth/Throat:     Mouth: Mucous membranes are moist.     Pharynx: No posterior oropharyngeal erythema.  Neck:     Thyroid: No thyromegaly.  Cardiovascular:     Rate and Rhythm: Normal rate and regular rhythm.     Pulses: Normal pulses.     Heart sounds: Normal heart sounds. No murmur heard. Pulmonary:     Effort: Pulmonary effort is normal.     Breath sounds: Normal breath sounds. No wheezing.  Abdominal:     General: Bowel sounds are normal.  Musculoskeletal:     Right lower leg: No edema.     Left lower leg: No edema.  Neurological:     General: No focal deficit present.     Mental Status: She is alert and oriented to person, place, and time. Mental status is at baseline.  Psychiatric:        Mood and Affect: Mood normal.        Behavior: Behavior normal.        Assessment:   This is a routine wellness examination for Joanne Hansen.  Hearing/Vision screen No results found.   Goals Addressed   None   Depression Screen    08/08/2023    9:30 AM 08/05/2022   11:23 AM 08/05/2022   10:30 AM 08/05/2021    9:07 AM 08/03/2021   11:28 AM 07/28/2020    3:15 PM 07/02/2019   11:50 AM  PHQ 2/9 Scores  PHQ - 2 Score 0 0 0 0 0 0 0  PHQ- 9 Score 3 0 0 0  0     Fall Risk    08/08/2023    9:29 AM 08/05/2022   11:25 AM 08/05/2022   10:30 AM 08/03/2021   11:31 AM 07/28/2020    3:11 PM  Fall Risk   Falls in the past year? 0 1 1  0  Comment   06/29/2022 per patient     Number falls in past yr: 0 0 0 0 0  Injury with Fall? 0 0 1 0 0  Comment  No injury. Followed by medical attention     Risk for fall due to : No Fall Risks No Fall Risks History of fall(s)  No Fall Risks  Follow up Falls evaluation completed Falls prevention discussed Falls evaluation completed  Falls evaluation completed;Falls prevention discussed    TIMED UP AND GO:  Was the test performed?  Yes  Length of time to ambulate 10 feet: 6 sec Gait steady and fast without use of assistive device    Cognitive Function:        08/08/2023    9:57 AM 08/05/2022   11:27 AM 08/03/2021   11:37 AM  6CIT Screen  What Year? 0 points 0 points 0 points  What month? 0 points 0 points 0 points  What time? 0 points 0 points 0 points  Count back from 20 0 points 0 points 0 points  Months in reverse 0 points 0 points 0 points  Repeat phrase 0 points 0 points 0 points  Total Score 0 points 0 points 0 points    Immunizations Immunization History  Administered Date(s) Administered   DTaP 04/12/2012   Fluad Quad(high Dose 65+) 05/29/2021   Influenza, High Dose Seasonal PF 05/02/2019   Influenza-Unspecified 06/01/2011, 05/17/2012, 06/27/2013, 06/03/2014, 05/19/2015, 04/23/2017, 05/06/2018, 05/02/2019, 05/15/2022, 05/07/2023   PFIZER(Purple Top)SARS-COV-2 Vaccination 09/16/2019, 10/22/2019, 06/28/2020, 01/01/2021   Pfizer Covid-19 Vaccine Bivalent Booster 35yrs & up 05/07/2023   Pneumococcal Conjugate-13 06/03/2014   Pneumococcal Polysaccharide-23 12/26/2012   RSV,unspecified 04/10/2023   Td 08/23/1994   Tdap 04/11/2012, 04/12/2012, 06/29/2022   Zoster Recombinant(Shingrix) 04/05/2021, 06/11/2021   Zoster, Live 09/04/2010    TDAP status: Up to date  Flu Vaccine status: Up to date  Pneumococcal vaccine status: Up to date- pt states she had the Prevnar 20 at the pharmacy more recently.  Covid-19 vaccine status: Completed vaccines  Qualifies for Shingles Vaccine? Yes   Zostavax  completed No   Shingrix Completed?: Yes  Screening Tests Health Maintenance  Topic Date Due   COVID-19 Vaccine (6 - 2024-25 season) 07/02/2023   Medicare Annual Wellness (AWV)  08/07/2024   Colonoscopy  11/20/2024   DTaP/Tdap/Td (6 - Td or Tdap) 06/29/2032   Pneumonia Vaccine 73+ Years old  Completed   INFLUENZA VACCINE  Completed   DEXA SCAN  Completed   Hepatitis C Screening  Completed   Zoster Vaccines- Shingrix  Completed   HPV VACCINES  Aged Out    Health Maintenance  Health Maintenance Due  Topic Date Due   COVID-19 Vaccine (6 - 2024-25 season) 07/02/2023    Colorectal cancer screening: No longer required.   Mammogram status: Completed 04/24/2023. Repeat every year  DEXA scan completed at [facility] 03/08/2023, I have requested results.  Lung Cancer Screening: (Low Dose CT Chest recommended if Age 28-80 years, 20 pack-year currently smoking OR have quit w/in 15years.) does not qualify.   Lung Cancer Screening  Referral: not needed, pt is not a smoker  Additional Screening:  Hepatitis C Screening: does qualify; Completed 07/02/2019  Vision Screening: Recommended annual ophthalmology exams for early detection of glaucoma and other disorders of the eye. Is the patient up to date with their annual eye exam?  Yes  Who is the provider or what is the name of the office in which the patient attends annual eye exams?  If pt is not established with a provider, would they like to be referred to a provider to establish care?  Not needed .   Dental Screening: Recommended annual dental exams for proper oral hygiene  Community Resource Referral / Chronic Care Management: CRR required this visit?  No   CCM required this visit?  No     Plan:     I have personally reviewed and noted the following in the patient's chart:   Medical and social history Use of alcohol, tobacco or illicit drugs  Current medications and supplements including opioid prescriptions. Patient is not  currently taking opioid prescriptions. Functional ability and status Nutritional status Physical activity Advanced directives-- pt was given handouts on the Advanced directives and I counseled her to let us know if she would like a call from the social worker to help her find resources.  List of other physicians Hospitalizations, surgeries, and ER visits in previous 12 months Vitals Screenings to include cognitive, depression, and falls Referrals and appointments  In addition, I have reviewed and discussed with patient certain preventive protocols, quality metrics, and best practice recommendations. A written personalized care plan for preventive services as well as general preventive health recommendations were provided to patient.     Karie Georges, MD   08/08/2023   After Visit Summary: (In Person-Printed) AVS printed and given to the patient

## 2023-08-09 ENCOUNTER — Other Ambulatory Visit: Payer: Medicare Other

## 2023-08-09 ENCOUNTER — Encounter: Payer: Medicare Other | Admitting: Family Medicine

## 2023-08-09 DIAGNOSIS — Z131 Encounter for screening for diabetes mellitus: Secondary | ICD-10-CM

## 2023-08-10 LAB — HEMOGLOBIN A1C
Hgb A1c MFr Bld: 5.2 %{Hb} (ref <5.7)
Mean Plasma Glucose: 103 mg/dL
eAG (mmol/L): 5.7 mmol/L

## 2023-11-07 ENCOUNTER — Encounter: Payer: Self-pay | Admitting: Cardiovascular Disease

## 2023-11-07 ENCOUNTER — Ambulatory Visit: Payer: Medicare Other | Attending: Cardiovascular Disease | Admitting: Cardiovascular Disease

## 2023-11-07 VITALS — BP 127/65 | HR 63 | Ht 62.0 in | Wt 121.0 lb

## 2023-11-07 DIAGNOSIS — E782 Mixed hyperlipidemia: Secondary | ICD-10-CM | POA: Diagnosis present

## 2023-11-07 DIAGNOSIS — I480 Paroxysmal atrial fibrillation: Secondary | ICD-10-CM | POA: Diagnosis not present

## 2023-11-07 DIAGNOSIS — R002 Palpitations: Secondary | ICD-10-CM | POA: Diagnosis present

## 2023-11-07 NOTE — Assessment & Plan Note (Signed)
 History of Pap patient's with an event monitor performed 05/09/2023 revealing occasional PACs, PVCs and 63 short runs of PSVT.  I did ask her to discontinue caffeine 3 months ago which she amazingly did and noticed an improvement in the palpitations.  She is also on twice daily metoprolol.

## 2023-11-07 NOTE — Assessment & Plan Note (Signed)
 History of hyperlipidemia on statin therapy with lipid profile performed 08/08/2023 revealing total cholesterol 180, LDL of 76 and HDL of 88.

## 2023-11-07 NOTE — Progress Notes (Signed)
 11/07/2023 Joanne Hansen   Oct 18, 1945  [id number]  Primary Physician Karie Georges, MD Primary Cardiologist: Runell Gess MD Nicholes Calamity, [place]  HPI:  Joanne Hansen is a 78 y.o.     thin and fit appearing married Caucasian female mother of 1 child who recently relocated from the [place] to [place] to be closer to family.  She is retired from doing clerical work.  She was referred by Dr. Hassan Rowan , her PCP, for episodes of dizziness and remote PAF.  I last saw her in the office 08/02/2023.  She did see Edd Fabian, NP in the office 09/13/2019. Her only risk factor is mild hyperlipidemia on statin therapy.  Her father did have a myocardial infarction at age 45 had bypass surgery.  She is never smoked.  She is never had a heart attack or stroke.  She denies chest pain or shortness of breath but she does have fatigue.  She developed breast cancer in September and had lumpectomy and has just finished her last round of chemotherapy and scheduled to start radiation therapy.  She feels fatigued.  She said 6 episodes of dizziness since starting chemotherapy without frank syncope.  She did have PAF demonstrated by Holter monitor in [place] in 2014.  She was followed by a cardiologist there who did not anticoagulate her.  Since that time she has had infrequent episodes of brief tachypalpitations.   I did order an event monitor but resulted on 08/05/2019 that did not show any PAF.  She had short runs of PSVT.  A 2D echo performed 08/16/2019 was essentially normal.  Unfortunately she did contract herpes zoster on the left side of her face which she is significantly symptomatic from but is slowly improving on Valtrex.  She did have her last radiation treatment for her breast cancer 09/21/2019.   Since I saw her in the past 3 months ago I did ask her to eliminate caffeine in her diet which she was compliant with.  She said that palpitations have significantly  improved.  She did have an event monitor 05/09/2023 revealing 63 short episodes of SVT.  She is on beta-blocker twice daily.  She denies chest pain or shortness of breath.  She is still walking 5 days a week.   Current Meds  Medication Sig   ascorbic acid (VITAMIN C) 500 MG tablet Take 500 mg by mouth daily.   CALCIUM-VITAMIN D PO Take 2 capsules by mouth daily. Vit D 1000 with minerals   carboxymethylcellulose (REFRESH PLUS) 0.5 % SOLN 1 drop as needed.   Cyanocobalamin (VITAMIN B 12 PO) Take 1,000 mg by mouth. Take one tablet three times weekly   metoprolol tartrate (LOPRESSOR) 25 MG tablet TAKE 1 TABLET BY MOUTH TWICE DAILY   simvastatin (ZOCOR) 40 MG tablet Take 1 tablet by mouth once daily     Allergies  Allergen Reactions   Penicillins Swelling   Sulfa Antibiotics Nausea And Vomiting    Social History   Socioeconomic History   Marital status: Married    Spouse name: Not on file   Number of children: Not on file   Years of education: Not on file   Highest education level: Not on file  Occupational History   Not on file  Tobacco Use   Smoking status: Never   Smokeless tobacco: Never  Vaping Use   Vaping status: Never Used  Substance and Sexual Activity   Alcohol use: Yes  Comment: Cocktails-3 days per week   Drug use: Never    Types: Amphetamines   Sexual activity: Not Currently    Birth control/protection: Surgical  Other Topics Concern   Not on file  Social History Narrative   Right handed   Married   Caffeine-2 cups daily   Social Drivers of Health   Financial Resource Strain: Low Risk  (08/08/2023)   Overall Financial Resource Strain (CARDIA)    Difficulty of Paying Living Expenses: Not hard at all  Food Insecurity: No Food Insecurity (08/08/2023)   Hunger Vital Sign    Worried About Running Out of Food in the Last Year: Never true    Ran Out of Food in the Last Year: Never true  Transportation Needs: No Transportation Needs (08/08/2023)   PRAPARE -  Administrator, Civil Service (Medical): No    Lack of Transportation (Non-Medical): No  Physical Activity: Sufficiently Active (08/08/2023)   Exercise Vital Sign    Days of Exercise per Week: 5 days    Minutes of Exercise per Session: 40 min  Stress: No Stress Concern Present (08/08/2023)   [employer] of Occupational Health - Occupational Stress Questionnaire    Feeling of Stress : Only a little  Social Connections: Moderately Isolated (08/08/2023)   Social Connection and Isolation Panel [NHANES]    Frequency of Communication with Friends and Family: More than three times a week    Frequency of Social Gatherings with Friends and Family: Once a week    Attends Religious Services: Never    Database administrator or Organizations: No    Attends Engineer, structural: Not on file    Marital Status: Married  Catering manager Violence: Not At Risk (08/08/2023)   Humiliation, Afraid, Rape, and Kick questionnaire    Fear of Current or Ex-Partner: No    Emotionally Abused: No    Physically Abused: No    Sexually Abused: No     Review of Systems: General: negative for chills, fever, night sweats or weight changes.  Cardiovascular: negative for chest pain, dyspnea on exertion, edema, orthopnea, palpitations, paroxysmal nocturnal dyspnea or shortness of breath Dermatological: negative for rash Respiratory: negative for cough or wheezing Urologic: negative for hematuria Abdominal: negative for nausea, vomiting, diarrhea, bright red blood per rectum, melena, or hematemesis Neurologic: negative for visual changes, syncope, or dizziness All other systems reviewed and are otherwise negative except as noted above.    Blood pressure 127/65, pulse 63, height 5\' 2"  (1.575 m), weight 121 lb (54.9 kg), SpO2 97%.  General appearance: alert and no distress Neck: no adenopathy, no carotid bruit, no JVD, supple, symmetrical, trachea midline, and thyroid not enlarged,  symmetric, no tenderness/mass/nodules Lungs: clear to auscultation bilaterally Heart: regular rate and rhythm, S1, S2 normal, no murmur, click, rub or gallop Extremities: extremities normal, atraumatic, no cyanosis or edema Pulses: 2+ and symmetric Skin: Skin color, texture, turgor normal. No rashes or lesions Neurologic: Grossly normal  EKG EKG Interpretation Date/Time:  Monday November 07 2023 12:12:18 EDT Ventricular Rate:  63 PR Interval:  144 QRS Duration:  82 QT Interval:  410 QTC Calculation: 419 R Axis:   -10  Text Interpretation: Normal sinus rhythm Normal ECG When compared with ECG of 09-May-2023 11:00, Questionable change in QRS axis Confirmed by Nanetta Batty [phone]) on 11/07/2023 12:31:56 PM    ASSESSMENT AND PLAN:   Hyperlipidemia History of hyperlipidemia on statin therapy with lipid profile performed 08/08/2023 revealing total cholesterol 180, LDL  of 76 and HDL of 88.  Paroxysmal atrial fibrillation (HCC) History of PAF apparently when she was in [place] but none that was seen on an event monitor performed 05/09/2023.  Palpitations History of Pap patient's with an event monitor performed 05/09/2023 revealing occasional PACs, PVCs and 63 short runs of PSVT.  I did ask her to discontinue caffeine 3 months ago which she amazingly did and noticed an improvement in the palpitations.  She is also on twice daily metoprolol.     Runell Gess MD FACP,FACC,FAHA, [facility] 11/07/2023 12:39 PM

## 2023-11-07 NOTE — Assessment & Plan Note (Signed)
 History of PAF apparently when she was in [place] but none that was seen on an event monitor performed 05/09/2023.

## 2023-11-07 NOTE — Patient Instructions (Signed)

## 2023-11-24 ENCOUNTER — Telehealth: Payer: Self-pay

## 2023-11-24 ENCOUNTER — Other Ambulatory Visit (HOSPITAL_COMMUNITY): Payer: Self-pay

## 2023-11-24 NOTE — Telephone Encounter (Signed)
 Spoke with the patient and informed her of the message below.  Patient stated her GYN Dr Vickey Sages has prescribed the medication and she will contact her office for the PA.

## 2023-11-24 NOTE — Telephone Encounter (Signed)
 I do not have the lidocaine listed in her medication list-- who was prescribing this for her? If she needs this medication then she will need to  make an appt to discuss

## 2023-11-24 NOTE — Telephone Encounter (Signed)
 Pharmacy Patient Advocate Encounter   Received notification from Onbase that prior authorization for LIDOCAINE 5% OINTMENT is required/requested.       No information in charts about Lidocaine to submit PA. Please advise how to proceed.

## 2024-01-16 ENCOUNTER — Other Ambulatory Visit: Payer: Self-pay | Admitting: Cardiology

## 2024-01-16 DIAGNOSIS — I471 Supraventricular tachycardia, unspecified: Secondary | ICD-10-CM

## 2024-01-20 ENCOUNTER — Other Ambulatory Visit: Payer: Self-pay | Admitting: Family Medicine

## 2024-01-20 DIAGNOSIS — E782 Mixed hyperlipidemia: Secondary | ICD-10-CM

## 2024-03-19 ENCOUNTER — Other Ambulatory Visit: Payer: Self-pay | Admitting: Hematology and Oncology

## 2024-03-19 DIAGNOSIS — Z1231 Encounter for screening mammogram for malignant neoplasm of breast: Secondary | ICD-10-CM

## 2024-05-02 ENCOUNTER — Ambulatory Visit
Admission: RE | Admit: 2024-05-02 | Discharge: 2024-05-02 | Disposition: A | Discharge status: Home / Self Care | Source: Ambulatory Visit | Attending: Hematology and Oncology

## 2024-05-02 DIAGNOSIS — Z1231 Encounter for screening mammogram for malignant neoplasm of breast: Secondary | ICD-10-CM

## 2024-05-11 ENCOUNTER — Ambulatory Visit: Payer: Medicare Other | Admitting: Hematology and Oncology

## 2024-05-15 ENCOUNTER — Inpatient Hospital Stay

## 2024-05-15 ENCOUNTER — Inpatient Hospital Stay: Payer: Medicare Other | Attending: Hematology and Oncology | Admitting: Hematology and Oncology

## 2024-05-15 VITALS — BP 111/65 | HR 69 | Temp 98.0°F | Resp 14 | Wt 118.7 lb

## 2024-05-15 DIAGNOSIS — Z853 Personal history of malignant neoplasm of breast: Secondary | ICD-10-CM | POA: Diagnosis present

## 2024-05-15 DIAGNOSIS — Z171 Estrogen receptor negative status [ER-]: Secondary | ICD-10-CM | POA: Diagnosis not present

## 2024-05-15 DIAGNOSIS — R1013 Epigastric pain: Secondary | ICD-10-CM | POA: Diagnosis present

## 2024-05-15 DIAGNOSIS — C50211 Malignant neoplasm of upper-inner quadrant of right female breast: Secondary | ICD-10-CM

## 2024-05-15 LAB — CMP (CANCER CENTER ONLY)
ALT: 20 U/L (ref 0–44)
AST: 29 U/L (ref 15–41)
Albumin: 4.5 g/dL (ref 3.5–5.0)
Alkaline Phosphatase: 64 U/L (ref 38–126)
Anion gap: 7 (ref 5–15)
BUN: 16 mg/dL (ref 8–23)
CO2: 30 mmol/L (ref 22–32)
Calcium: 10.1 mg/dL (ref 8.9–10.3)
Chloride: 104 mmol/L (ref 98–111)
Creatinine: 0.7 mg/dL (ref 0.44–1.00)
GFR, Estimated: >60 mL/min (ref >60)
Glucose, Bld: 100 mg/dL — ABNORMAL HIGH (ref 70–99)
Potassium: 4 mmol/L (ref 3.5–5.1)
Sodium: 141 mmol/L (ref 135–145)
Total Bilirubin: 0.6 mg/dL (ref 0.0–1.2)
Total Protein: 7.6 g/dL (ref 6.5–8.1)

## 2024-05-15 LAB — CBC WITH DIFFERENTIAL/PLATELET
Abs Immature Granulocytes: 0.01 K/uL (ref 0.00–0.07)
Basophils Absolute: 0 K/uL (ref 0.0–0.1)
Basophils Relative: 1 %
Eosinophils Absolute: 0.1 K/uL (ref 0.0–0.5)
Eosinophils Relative: 2 %
HCT: 39.4 % (ref 36.0–46.0)
Hemoglobin: 13.1 g/dL (ref 12.0–15.0)
Immature Granulocytes: 0 %
Lymphocytes Relative: 26 %
Lymphs Abs: 1.4 K/uL (ref 0.7–4.0)
MCH: 33.2 pg (ref 26.0–34.0)
MCHC: 33.2 g/dL (ref 30.0–36.0)
MCV: 100 fL (ref 80.0–100.0)
Monocytes Absolute: 0.7 K/uL (ref 0.1–1.0)
Monocytes Relative: 12 %
Neutro Abs: 3.1 K/uL (ref 1.7–7.7)
Neutrophils Relative %: 59 %
Platelets: 212 K/uL (ref 150–400)
RBC: 3.94 MIL/uL (ref 3.87–5.11)
RDW: 11.8 % (ref 11.5–15.5)
WBC: 5.3 K/uL (ref 4.0–10.5)
nRBC: 0 % (ref 0.0–0.2)

## 2024-05-15 NOTE — Progress Notes (Signed)
 [facility]  [phone] [phone]    ID: Joanne Hansen DOB: [birth year]  MR#: [id number]  RDW#:[id number]  Patient Care Team: Ozell Heron HERO, MD as PCP - General (Family Medicine) Court Dorn JINNY, MD as PCP - Cardiology (Cardiology) Vanderbilt Ned, MD as Consulting Physician (General Surgery) Dewey Rush, MD as Consulting Physician (Radiation Oncology) Ned Hila, MD as Consulting Physician (General Surgery) Gaston Hamilton, MD as Consulting Physician (Urology) Bonner Ade, MD as Consulting Physician (Physical Medicine and Rehabilitation) Latisha Medford, MD as Consulting Physician (Obstetrics and Gynecology)  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  History of Present Illness    History of Present Illness Joanne Hansen is a 78 year old female who presents with epigastric pain.  She woke up with epigastric pain on Saturday morning, which has persisted without significant change. The pain worsens with deep breaths and affects her appetite. She has not tried any medications for relief. No worsening of pain, shortness of breath, or lightheadedness.  She has a history of breast cancer, now five years out, with no recent changes in her breast or mammogram results. She is currently on metoprolol  for her cardiac health. She recalls experiencing lightheadedness and low energy last year, for which she underwent a comprehensive cardiac workup.  In terms of her social history, she maintains an active lifestyle, walking two to three miles five days a week. She has been taking biotin since July to address hair loss, which she notices primarily on the top of  her head, although she observes new hair growth as well.   Rest of the pertinent 10 point ROS reviewed and negative.  HISTORY OF CURRENT ILLNESS: From the original intake note:  Joanne Hansen had routine screening mammography on 04/10/2019 showing a possible abnormality in the right breast. She underwent unilateral right diagnostic mammography with tomography and right breast ultrasonography at [facility] on 04/12/2019 showing: Breast Density Category B. Spot compression views of the medial right breast, including tomography confirm an irregular mass spanning approximately 0.9 cm. On physical exam, no mass is palpated in the upper inner quadrant of the right breast. Targeted ultrasound is performed, showing a single irregular mass versus two immediately adjacent small irregular masses, that in total measure 1.1 cm. Individually, the two irregular masses measure 0.8 x 0.4 x 0.5 cm and 0.4 x 0.4 x 0.4 cm, respectively. Mass(es) are at 1 o'clock position 3 cm from the nipple. Ultrasound of the right axilla is negative for lymphadenopathy.  Accordingly on 04/16/2019 she proceeded to biopsy of the right breast area in question. The pathology from this procedure showed ([id number]): invasive ductal carcinoma, grade II. Prognostic indicators significant for: estrogen receptor, 0% negative and progesterone receptor, 0% negative,. Proliferation marker Ki67 at 20%. HER2 negative (1+) by immunohistochemistry.  The patient's subsequent history is as detailed below.   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   Family history of brain cancer    Family history of breast cancer    Family history of colon cancer    Fecal incontinence    05-16-2019 per pt only a little leakage but controllable (  was in clinical trial for Solesta without benefit.)   Frequent headaches    History of syncope     pre-syncope  08/ 2015  per pt no issue with this since    Hyperlipidemia    Malignant neoplasm of upper-inner quadrant of right breast in female, estrogen receptor negative [facility]) oncologist-- dr breck  dr moody   dx 08/ 2020--  invasive ductal carcinoma,  05-13-2019  s/p right breast lumpectomy    PAF (paroxysmal atrial fibrillation) (HCC) (05-16-2019   pt from [place] , recently moved to [place] to be near daughter, has not established a cardiology yet--  previously seen by dr unice cossu (last office note dated 02-28-2018 scanned in epic)---  echo 04-15-2014  ef 55-60%, G1DD mild AR without stenosis, RVSp   Personal history of radiation therapy    stopped 09/22/19    Rectal cyst    Thyroid  goiter    pt ultrasound in epic 06-04-2014 , nodules, no bx done   Urinary incontinence, mixed     PAST SURGICAL HISTORY: Past Surgical History:  Procedure Laterality Date   ABDOMINAL HYSTERECTOMY  1978   w/  RSO  and APPENDECTOMY   BREAST BIOPSY Right 08/2009   benign   BREAST LUMPECTOMY Right    BREAST LUMPECTOMY WITH RADIOACTIVE SEED AND SENTINEL LYMPH NODE BIOPSY Right 05/15/2019   Procedure: RIGHT BREAST LUMPECTOMY WITH RADIOACTIVE SEED AND SENTINEL LYMPH NODE MAPPING;  Surgeon: Vanderbilt Ned, MD;  Location: [facility];  Service: General;  Laterality: Right;   BREAST SURGERY  2011   biospy of right breast   CATARACT EXTRACTION W/ INTRAOCULAR LENS  IMPLANT, BILATERAL  2004   COLONOSCOPY  11/21/2019   FLEXIBLE SIGMOIDOSCOPY N/A 05/18/2019   Procedure: FLEXIBLE SIGMOIDOSCOPY,  TRANSANAL EXCISION inclusion cyst;  Surgeon: Ned Hila, MD;  Location: [facility];  Service: General;  Laterality: N/A;   gluteus medius repair  2017   LUMBAR LAMINECTOMY  2013   L5 -- S1   PORT-A-CATH REMOVAL Right 09/27/2019   Procedure: PORT REMOVAL;  Surgeon: Vanderbilt Ned, MD;  Location: [facility];  Service: General;  Laterality: Right;   PORTACATH  PLACEMENT Right 05/15/2019   Procedure: INSERTION PORT-A-CATH WITH ULTRASOUND;  Surgeon: Vanderbilt Ned, MD;  Location: [facility];  Service: General;  Laterality: Right;   ROTATOR CUFF REPAIR Left 2001    FAMILY HISTORY: Family History  Problem Relation Age of Onset   COPD Mother    Cancer Mother 52       colon   Arthritis Mother    Hypertension Mother    Miscarriages / [place] Mother    Stroke Mother 40   Alzheimer's disease Mother 27   Heart disease Father    Heart attack Father 61   Lung disease Father    Arthritis Sister    Breast cancer Sister 26       Lumpectomy   Diabetes Maternal Grandmother    Breast cancer Niece 48       Louise's Daughter   Cervical cancer Niece 21  The patient's father died at age 48 from a heart attack.  The patient's mother died at age 10; she had a gastrointestinal cancer diagnosed age 19.  The patient has 1 sister with breast cancer and one niece with breast cancer diagnosed at age 46.   GYNECOLOGIC HISTORY:  No LMP recorded. Patient has had a hysterectomy. 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  Her husband Cozette (goes by Courtenay) is a retired Art gallery manager.  Their daughter Lamarr Hilt (currently divorced) lives in [place].  The patient has no grandchildren.  She is not a church attender   ADVANCED DIRECTIVES: The patient's husband is her healthcare power of attorney   HEALTH MAINTENANCE: Social History   Tobacco Use   Smoking status: Never   Smokeless tobacco: Never  Vaping Use   Vaping status: Never Used  Substance Use Topics   Alcohol use: Yes    Comment: Cocktails-3 days per week   Drug use: Never    Types: Amphetamines    Colonoscopy: March 2021/  PAP: Status  post hysterectomy  Bone density: Pending   Allergies  Allergen Reactions   Penicillins Swelling   Sulfa Antibiotics Nausea And Vomiting     OBJECTIVE: white woman who appears well No cervical or axillary adenopathy Bilateral breasts inspected and palpated. No palpable masses or regional adenopathy NO LE edema.  Vitals:   05/15/24 1339  BP: 111/65  Pulse: 69  Resp: 14  Temp: 98 F (36.7 C)  SpO2: 100%     Wt Readings from Last 3 Encounters:  05/15/24 118 lb 11.2 oz (53.8 kg)  11/07/23 121 lb (54.9 kg)  08/08/23 119 lb 8 oz (54.2 kg)   Body mass index is 21.71 kg/m.    ECOG FS:1 - Symptomatic but completely ambulatory   Sclerae unicteric, EOMs intact Right breast with significant post surgical changes,  post op likely organized seroma vs scar. No palpable lymphadenopathy   LAB RESULTS:  CMP     Component Value Date/Time   NA 139 05/12/2023 1400   K 4.0 05/12/2023 1400   CL 103 05/12/2023 1400   CO2 31 05/12/2023 1400   GLUCOSE 91 05/12/2023 1400   BUN 16 05/12/2023 1400   CREATININE 0.68 05/12/2023 1400   CALCIUM  9.8 05/12/2023 1400   PROT 7.0 05/12/2023 1400   PROT 6.6 02/06/2021 0845   ALBUMIN 4.3 05/12/2023 1400   ALBUMIN 4.7 02/06/2021 0845   AST 18 05/12/2023 1400   ALT 13 05/12/2023 1400   ALKPHOS 54 05/12/2023 1400   BILITOT 0.7 05/12/2023 1400   GFRNONAA >60 05/12/2023 1400   GFRAA >60 05/21/2020 1413   GFRAA >60 05/07/2019 1547    No results found for: TOTALPROTELP, ALBUMINELP, A1GS, A2GS, BETS, BETA2SER, GAMS, MSPIKE, SPEI  No results found for: KPAFRELGTCHN, LAMBDASER, KAPLAMBRATIO  Lab Results  Component Value Date   WBC 5.7 05/12/2023   NEUTROABS 3.4 05/12/2023   HGB 13.0 05/12/2023   HCT 39.7 05/12/2023   MCV 101.0 (H) 05/12/2023   PLT 205 05/12/2023    No results found for: LABCA2  No components found for: [id number]  No results for input(s): INR in the last 168 hours.  No results found  for: LABCA2  No results found for: CAN199  No results found for: CAN125  No results found for: CAN153  No results found for: CA2729  No components found for: HGQUANT  No results found for: CEA1, CEA / No results found for: CEA1, CEA   No results found for: AFPTUMOR  No results found for: CHROMOGRNA  No results found for: HGBA, HGBA2QUANT, HGBFQUANT, HGBSQUAN (Hemoglobinopathy evaluation)   No results found for: LDH  Lab Results  Component Value Date  IRON 117 12/20/2019   TIBC 298 12/20/2019   IRONPCTSAT 39 12/20/2019   (Iron and TIBC)  Lab Results  Component Value Date   FERRITIN 92 12/20/2019    Urinalysis    Component Value Date/Time   BILIRUBINUR Negative 02/27/2020 1123   PROTEINUR Negative 02/27/2020 1123   UROBILINOGEN 0.2 02/27/2020 1123   NITRITE Negative 02/27/2020 1123   LEUKOCYTESUR Trace (A) 02/27/2020 1123    STUDIES:  MM 3D SCREENING MAMMOGRAM BILATERAL BREAST Result Date: 05/07/2024 CLINICAL DATA:  Screening. EXAM: DIGITAL SCREENING BILATERAL MAMMOGRAM WITH TOMOSYNTHESIS AND CAD TECHNIQUE: Bilateral screening digital craniocaudal and mediolateral oblique mammograms were obtained. Bilateral screening digital breast tomosynthesis was performed. The images were evaluated with computer-aided detection. COMPARISON:  Previous exam(s). ACR Breast Density Category b: There are scattered areas of fibroglandular density. FINDINGS: There are no findings suspicious for malignancy. IMPRESSION: No mammographic evidence of malignancy. A result letter of this screening mammogram will be mailed directly to the patient. RECOMMENDATION: Screening mammogram in one year. (Code:SM-B-01Y) BI-RADS CATEGORY  1: Negative. Electronically Signed   By: Dina  Arceo M.D.   On: 05/07/2024 05:18     ELIGIBLE FOR AVAILABLE RESEARCH PROTOCOL: NO   ASSESSMENT: 78 y.o. [place], [place] woman status post right breast upper quadrant biopsy for an mT1b  (or single T1c)  invasive ductal carcinoma, grade 2, triple negative, with an MIB-1-1 of 20%.  (1) Genetic testing on 05/07/2019 through the [facility] Breast Cancer STAT Panel + Common Hereditary cancer panel found no pathogenic mutations. The STAT Breast cancer panel offered by [facility] includes sequencing and rearrangement analysis for the following 9 genes:  ATM, BRCA1, BRCA2, CDH1, CHEK2, PALB2, PTEN, STK11 and TP53.  The Common Hereditary Cancers Panel offered by [facility] includes sequencing and/or deletion duplication testing of the following 47 genes: APC, ATM, AXIN2, BARD1, BMPR1A, BRCA1, BRCA2, BRIP1, CDH1, CDKN2A (p14ARF), CDKN2A (p16INK4a), CKD4, CHEK2, CTNNA1, DICER1, EPCAM (Deletion/duplication testing only), GREM1 (promoter region deletion/duplication testing only), KIT, MEN1, MLH1, MSH2, MSH3, MSH6, MUTYH, NBN, NF1, NHTL1, PALB2, PDGFRA, PMS2, POLD1, POLE, PTEN, RAD50, RAD51C, RAD51D, SDHB, SDHC, SDHD, SMAD4, SMARCA4. STK11, TP53, TSC1, TSC2, and VHL.  The following genes were evaluated for sequence changes only: SDHA and HOXB13 c.251G>A variant only.  (2) s/p right lumpectomy and sentinel lymph node sampling 05/15/2019 for a pT1b pN0 stage IB invasive ductal carcinoma, grade 3, with negative margins.  (a) a total of 2 sentinel lymph nodes were removed  (3) adjuvant chemotherapy consisting of cyclophosphamide  and docetaxel  every 21 days x 4, started 05/29/2019, completed 07/31/2019  (4) adjuvant radiation 08/27/2019 - 09/21/2019 The right breast was treated to 42.56 Gy in 16 fractions followed by an 8 Gy boost to the surgical cavity.  (5) opted against prophylactic antiestrogens  (a) bone density 04/10/2020 shows a T score of -1.9  (6) severe vaginal atrophy with dyspareunia  (a) Estring  prescribed 05/20/2021- not using.   PLAN: Assessment & Plan Breast cancer with residual scar tissue and seroma Five years post breast cancer with residual scar tissue and seroma. Recent mammogram normal.  Dense breast tissue and tenderness noted. - Perform routine blood work including regular blood counts and comprehensive metabolic panel. - Schedule follow-up appointment for one year pt preference.  Epigastric pain Acute epigastric pain, worsens with deep breaths. Differential includes heartburn and hiatal hernia. Unlikely cardiac or thrombotic origin. - Recommend trial of Pepcid (famotidine) twice daily for at least three days. - Advise to seek urgent care or emergency room evaluation if symptoms worsen, such  as increased shortness of breath, chest pain, or lightheadedness.   Total time spent: 30 minutes  *Total Encounter Time as defined by the Centers for Medicare and Medicaid Services includes, in addition to the face-to-face time of a patient visit (documented in the note above) non-face-to-face time: obtaining and reviewing outside history, ordering and reviewing medications, tests or procedures, care coordination (communications with other health care professionals or caregivers) and documentation in the medical record.

## 2024-07-07 ENCOUNTER — Other Ambulatory Visit: Payer: Self-pay | Admitting: Cardiology

## 2024-07-07 DIAGNOSIS — I471 Supraventricular tachycardia, unspecified: Secondary | ICD-10-CM

## 2024-07-22 ENCOUNTER — Other Ambulatory Visit: Payer: Self-pay | Admitting: Family Medicine

## 2024-07-22 DIAGNOSIS — E782 Mixed hyperlipidemia: Secondary | ICD-10-CM

## 2024-08-19 ENCOUNTER — Other Ambulatory Visit: Payer: Self-pay | Admitting: Cardiovascular Disease

## 2024-08-19 DIAGNOSIS — I471 Supraventricular tachycardia, unspecified: Secondary | ICD-10-CM

## 2024-10-19 ENCOUNTER — Ambulatory Visit

## 2024-11-07 ENCOUNTER — Ambulatory Visit: Admitting: Cardiovascular Disease

## 2025-05-17 ENCOUNTER — Ambulatory Visit: Admitting: Hematology and Oncology
